# Patient Record
Sex: Female | Born: 1945 | Race: White | Hispanic: No | Marital: Married | Smoking: Never smoker
Health system: Southern US, Community
[De-identification: ages and names within clinical notes are randomized; demographics above are authoritative.]

## PROBLEM LIST (undated history)

## (undated) DIAGNOSIS — Z969 Presence of functional implant, unspecified: Secondary | ICD-10-CM

## (undated) DIAGNOSIS — E785 Hyperlipidemia, unspecified: Secondary | ICD-10-CM

## (undated) DIAGNOSIS — R0989 Other specified symptoms and signs involving the circulatory and respiratory systems: Secondary | ICD-10-CM

## (undated) DIAGNOSIS — J189 Pneumonia, unspecified organism: Secondary | ICD-10-CM

## (undated) DIAGNOSIS — H269 Unspecified cataract: Secondary | ICD-10-CM

## (undated) DIAGNOSIS — R519 Headache, unspecified: Secondary | ICD-10-CM

## (undated) DIAGNOSIS — K635 Polyp of colon: Secondary | ICD-10-CM

## (undated) DIAGNOSIS — J329 Chronic sinusitis, unspecified: Secondary | ICD-10-CM

## (undated) DIAGNOSIS — M545 Low back pain, unspecified: Secondary | ICD-10-CM

## (undated) DIAGNOSIS — R51 Headache: Secondary | ICD-10-CM

## (undated) DIAGNOSIS — K219 Gastro-esophageal reflux disease without esophagitis: Secondary | ICD-10-CM

## (undated) DIAGNOSIS — K5790 Diverticulosis of intestine, part unspecified, without perforation or abscess without bleeding: Secondary | ICD-10-CM

## (undated) DIAGNOSIS — I2510 Atherosclerotic heart disease of native coronary artery without angina pectoris: Secondary | ICD-10-CM

## (undated) DIAGNOSIS — I700 Atherosclerosis of aorta: Secondary | ICD-10-CM

## (undated) DIAGNOSIS — E6609 Other obesity due to excess calories: Secondary | ICD-10-CM

## (undated) DIAGNOSIS — E2839 Other primary ovarian failure: Secondary | ICD-10-CM

## (undated) DIAGNOSIS — R011 Cardiac murmur, unspecified: Secondary | ICD-10-CM

## (undated) DIAGNOSIS — Z8719 Personal history of other diseases of the digestive system: Secondary | ICD-10-CM

## (undated) DIAGNOSIS — R9431 Abnormal electrocardiogram [ECG] [EKG]: Secondary | ICD-10-CM

## (undated) DIAGNOSIS — Z90710 Acquired absence of both cervix and uterus: Secondary | ICD-10-CM

## (undated) DIAGNOSIS — R0789 Other chest pain: Secondary | ICD-10-CM

## (undated) DIAGNOSIS — M1990 Unspecified osteoarthritis, unspecified site: Secondary | ICD-10-CM

## (undated) DIAGNOSIS — K869 Disease of pancreas, unspecified: Secondary | ICD-10-CM

## (undated) DIAGNOSIS — R531 Weakness: Secondary | ICD-10-CM

## (undated) DIAGNOSIS — I10 Essential (primary) hypertension: Secondary | ICD-10-CM

## (undated) DIAGNOSIS — C801 Malignant (primary) neoplasm, unspecified: Secondary | ICD-10-CM

## (undated) DIAGNOSIS — M25569 Pain in unspecified knee: Secondary | ICD-10-CM

## (undated) DIAGNOSIS — K7460 Unspecified cirrhosis of liver: Secondary | ICD-10-CM

## (undated) DIAGNOSIS — N182 Chronic kidney disease, stage 2 (mild): Secondary | ICD-10-CM

## (undated) DIAGNOSIS — K2270 Barrett's esophagus without dysplasia: Secondary | ICD-10-CM

## (undated) DIAGNOSIS — M7710 Lateral epicondylitis, unspecified elbow: Secondary | ICD-10-CM

## (undated) DIAGNOSIS — R300 Dysuria: Secondary | ICD-10-CM

## (undated) DIAGNOSIS — N951 Menopausal and female climacteric states: Secondary | ICD-10-CM

## (undated) HISTORY — PX: FRACTURE SURGERY: SHX138

## (undated) HISTORY — DX: Menopausal and female climacteric states: N95.1

## (undated) HISTORY — DX: Acquired absence of both cervix and uterus: Z90.710

## (undated) HISTORY — DX: Other chest pain: R07.89

## (undated) HISTORY — DX: Dysuria: R30.0

## (undated) HISTORY — DX: Atherosclerotic heart disease of native coronary artery without angina pectoris: I25.10

## (undated) HISTORY — PX: BACK SURGERY: SHX140

## (undated) HISTORY — PX: BREAST SURGERY: SHX581

## (undated) HISTORY — DX: Presence of functional implant, unspecified: Z96.9

## (undated) HISTORY — PX: COLONOSCOPY W/ POLYPECTOMY: SHX1380

## (undated) HISTORY — DX: Other obesity due to excess calories: E66.09

## (undated) HISTORY — DX: Polyp of colon: K63.5

## (undated) HISTORY — DX: Other primary ovarian failure: E28.39

## (undated) HISTORY — DX: Cardiac murmur, unspecified: R01.1

## (undated) HISTORY — DX: Unspecified cataract: H26.9

## (undated) HISTORY — PX: INTRATHECAL PUMP IMPLANT: SHX6809

## (undated) HISTORY — DX: Barrett's esophagus without dysplasia: K22.70

## (undated) HISTORY — PX: ABDOMINAL HYSTERECTOMY: SHX81

## (undated) HISTORY — DX: Low back pain: M54.5

## (undated) HISTORY — PX: CATARACT EXTRACTION W/ INTRAOCULAR LENS IMPLANT: SHX1309

## (undated) HISTORY — DX: Chronic sinusitis, unspecified: J32.9

## (undated) HISTORY — PX: CHOLECYSTECTOMY: SHX55

## (undated) HISTORY — DX: Weakness: R53.1

## (undated) HISTORY — PX: CARPAL TUNNEL RELEASE: SHX101

## (undated) HISTORY — DX: Abnormal electrocardiogram (ECG) (EKG): R94.31

## (undated) HISTORY — DX: Pain in unspecified knee: M25.569

## (undated) HISTORY — DX: Unspecified osteoarthritis, unspecified site: M19.90

## (undated) HISTORY — DX: Lateral epicondylitis, unspecified elbow: M77.10

## (undated) HISTORY — DX: Hyperlipidemia, unspecified: E78.5

## (undated) HISTORY — DX: Malignant (primary) neoplasm, unspecified: C80.1

## (undated) HISTORY — DX: Gastro-esophageal reflux disease without esophagitis: K21.9

## (undated) HISTORY — DX: Low back pain, unspecified: M54.50

## (undated) HISTORY — DX: Other specified symptoms and signs involving the circulatory and respiratory systems: R09.89

---

## 2004-04-15 ENCOUNTER — Ambulatory Visit (HOSPITAL_COMMUNITY): Admission: RE | Admit: 2004-04-15 | Discharge: 2004-04-16 | Payer: Self-pay | Admitting: Neurosurgery

## 2005-02-07 ENCOUNTER — Ambulatory Visit (HOSPITAL_COMMUNITY): Admission: RE | Admit: 2005-02-07 | Discharge: 2005-02-07 | Payer: Self-pay | Admitting: Neurosurgery

## 2010-05-16 HISTORY — PX: CARDIAC CATHETERIZATION: SHX172

## 2010-09-13 ENCOUNTER — Encounter (HOSPITAL_COMMUNITY)
Admission: RE | Admit: 2010-09-13 | Discharge: 2010-09-13 | Disposition: A | Discharge status: Home / Self Care | Payer: Medicare Other | Source: Ambulatory Visit | Attending: Neurosurgery | Admitting: Neurosurgery

## 2010-09-13 LAB — BASIC METABOLIC PANEL
BUN: 4 mg/dL — ABNORMAL LOW (ref 6–23)
CO2: 34 mEq/L — ABNORMAL HIGH (ref 19–32)
Chloride: 102 mEq/L (ref 96–112)
Creatinine, Ser: 0.51 mg/dL (ref 0.4–1.2)
GFR calc Af Amer: >60 mL/min (ref >60)
Glucose, Bld: 86 mg/dL (ref 70–99)
Potassium: 3.8 mEq/L (ref 3.5–5.1)

## 2010-09-13 LAB — CBC
HCT: 42.6 % (ref 36.0–46.0)
Hemoglobin: 14.4 g/dL (ref 12.0–15.0)
MCH: 30.6 pg (ref 26.0–34.0)
MCV: 90.6 fL (ref 78.0–100.0)
Platelets: 176 10*3/uL (ref 150–400)
RBC: 4.7 MIL/uL (ref 3.87–5.11)
RDW: 12.4 % (ref 11.5–15.5)
WBC: 8.6 10*3/uL (ref 4.0–10.5)

## 2010-09-13 LAB — SURGICAL PCR SCREEN: MRSA, PCR: NEGATIVE

## 2010-09-17 ENCOUNTER — Ambulatory Visit (HOSPITAL_COMMUNITY): Payer: Medicare Other

## 2010-09-17 ENCOUNTER — Observation Stay (HOSPITAL_COMMUNITY)
Admission: RE | Admit: 2010-09-17 | Discharge: 2010-09-17 | Disposition: A | Discharge status: Home / Self Care | Payer: Medicare Other | Source: Ambulatory Visit | Attending: Neurosurgery | Admitting: Neurosurgery

## 2010-09-17 DIAGNOSIS — Z01812 Encounter for preprocedural laboratory examination: Secondary | ICD-10-CM | POA: Insufficient documentation

## 2010-09-17 DIAGNOSIS — Z01818 Encounter for other preprocedural examination: Secondary | ICD-10-CM | POA: Insufficient documentation

## 2010-09-17 DIAGNOSIS — G8929 Other chronic pain: Secondary | ICD-10-CM | POA: Insufficient documentation

## 2010-09-17 DIAGNOSIS — K219 Gastro-esophageal reflux disease without esophagitis: Secondary | ICD-10-CM | POA: Insufficient documentation

## 2010-09-17 DIAGNOSIS — J45909 Unspecified asthma, uncomplicated: Secondary | ICD-10-CM | POA: Insufficient documentation

## 2010-09-17 DIAGNOSIS — T85695A Other mechanical complication of other nervous system device, implant or graft, initial encounter: Principal | ICD-10-CM | POA: Insufficient documentation

## 2010-09-17 DIAGNOSIS — Y831 Surgical operation with implant of artificial internal device as the cause of abnormal reaction of the patient, or of later complication, without mention of misadventure at the time of the procedure: Secondary | ICD-10-CM | POA: Insufficient documentation

## 2010-09-17 DIAGNOSIS — I10 Essential (primary) hypertension: Secondary | ICD-10-CM | POA: Insufficient documentation

## 2010-09-21 NOTE — Op Note (Signed)
  [name], [name]              ACCOUNT NO.:  [id number]  MEDICAL RECORD NO.:  [id number]           PATIENT TYPE:  O  LOCATION:  3524                         FACILITY:  [facility]  PHYSICIAN:  Danae Orleans. Venetia Maxon, M.D.  DATE OF BIRTH:  12-Jun-1945  DATE OF PROCEDURE:  09/17/2010 DATE OF DISCHARGE:  09/17/2010                              OPERATIVE REPORT   PREOPERATIVE DIAGNOSIS:  Depleted intrathecal pain pump with secondary diagnosis of chronic pain.  POSTOPERATIVE DIAGNOSIS:  Depleted intrathecal pain pump with secondary diagnosis of chronic pain.  PROCEDURE:  Revision of intrathecal pump.  SURGEON:  Danae Orleans. Venetia Maxon, MD  ANESTHESIA:  General.  BLOOD LOSS:  Minimal.  COMPLICATIONS:  None.  DISPOSITION:  Recovery.  INDICATIONS:  Tiffany Hunter is a 65 year old woman with chronic intractable pain who has an implanted Medtronic intrathecal SynchroMed II pump, it was elected to end the battery with failing and it was elected to revise the implanted pump.  PROCEDURE:  Tiffany Hunter was brought to the operating room.  Following satisfactory and uncomplicated induction of general endotracheal anesthesia plus intravenous lines, the patient was placed in supine position on the operating table.  Her abdomen was then prepped and draped in usual sterile fashion with Betadine scrub and paint.  Area of planned incision was infiltrated with local lidocaine.  Previous incision was opened, pump was explanted.  The woven sock was cut to help remove the pump.  The old pump catheter was disconnected and a small portion of the tubing was cut.  A sutureless connector was then attached.  The new pump which had been loaded with fentanyl, bupivacaine, and hydromorphone according to the previous delivery schedule was then affixed to the sutureless connector.  The side port was accessed and the CSF withdrawn prior to hooking the pump, assembling the pump 2 mL of CSF was withdrawn from the catheter  without difficulty. The assemble pump was then repositioned in the subcutaneous pocket in the woven pouch which was then sutured back together and an anchoring stitch was placed to prevent the pump from flipping.  The wound was then closed with 2-0 and 3-0 Vicryl sutures. The sterile occlusive dressing was placed.  The patient was taken to the recovery room and tolerated the procedure well.     Danae Orleans. Venetia Maxon, M.D.     JDS/MEDQ  D:  09/17/2010  T:  09/18/2010  Job:  [id number]  Electronically Signed by Maeola Harman M.D. on 09/21/2010 09:45:01 AM

## 2010-11-19 NOTE — Discharge Summary (Signed)
  [name], [name] NO.:  [id number]  MEDICAL RECORD NO.:  [id number]  LOCATION:  3524                         FACILITY:  [facility]  PHYSICIAN:  Danae Orleans. Venetia Maxon, M.D.  DATE OF BIRTH:  1946-02-19  DATE OF ADMISSION:  09/17/2010 DATE OF DISCHARGE:  09/17/2010                              DISCHARGE SUMMARY   Tiffany Hunter is a 65 year old woman with chronic intractable pain who had an implanted Medtronic intrathecal SynchroMed II pump.  The battery was failing.  It was elected to revise the implant pump.  The patient was admitted to the hospital and underwent pump revision.  She did well with this procedure and was doing well immediately following surgery and was discharged home later that same day.  She was discharged home on aspirin, albuterol inhaler, metoclopramide 10 mg twice daily, and oxybutynin XL 10 mg daily.  The patient discharged home having tolerated the procedure and hospitalization well with instructions to follow up in my office 2-3 weeks postoperatively.     Danae Orleans. Venetia Maxon, M.D.     JDS/MEDQ  D:  11/16/2010  T:  11/16/2010  Job:  [id number]  Electronically Signed by Maeola Harman M.D. on 11/19/2010 09:19:43 AM

## 2010-12-15 ENCOUNTER — Institutional Professional Consult (permissible substitution): Payer: Medicare Other | Admitting: Internal Medicine

## 2010-12-15 ENCOUNTER — Encounter: Payer: Self-pay | Admitting: Internal Medicine

## 2010-12-16 ENCOUNTER — Encounter: Payer: Self-pay | Admitting: *Deleted

## 2010-12-16 ENCOUNTER — Ambulatory Visit (INDEPENDENT_AMBULATORY_CARE_PROVIDER_SITE_OTHER): Payer: Medicare Other | Admitting: Internal Medicine

## 2010-12-16 ENCOUNTER — Encounter: Payer: Self-pay | Admitting: Internal Medicine

## 2010-12-16 VITALS — BP 153/90 | HR 66 | Resp 18 | Ht 62.0 in | Wt 184.0 lb

## 2010-12-16 DIAGNOSIS — R0602 Shortness of breath: Secondary | ICD-10-CM

## 2010-12-16 DIAGNOSIS — R9439 Abnormal result of other cardiovascular function study: Secondary | ICD-10-CM

## 2010-12-16 NOTE — Progress Notes (Signed)
HPI:  Tiffany Hunter is a 65 y/o with h/o obesity, HL, GERD, asthma and severe back pain with implantable morphine pump referred by Arnette Felts for evaluation of abnormal stress test and possible PAH.  Has a h/o mild DOE. Underwent Myoview 4/12 EF 72% Reversible defect in inferoseptal, inferior,  inferior and apical walls.  Echo 70% RV mild RVH but normal fx. Mild diastolic dysfunction. Trace TR. Unable to estimate pulmonary pressures.   Denies edema. Remains fairly active. No problems significant dyspnea. No CP. Dizziness. Never smoked. No significant cough. Occasional wheezing. Says she had PFTs and they were OK (I don't have report).    ROS: All other systems normal except as mentioned in HPI, past medical history and problem list.    Past Medical History  Diagnosis Date  . Pulmonary hypertension   . Barrett's esophagus   . Colonic polyp   . Low back pain   . Hyperlipidemia   . Ovarian failure   . GERD (gastroesophageal reflux disease)   . Exogenous obesity   . Sinusitis   . Arthritis   . Chest wall pain   . Carotid artery bruit   . Heart murmur   . Osteoporosis   . Asthma   . Abnormal EKG   . Weakness   . H/O: hysterectomy     Current Outpatient Prescriptions  Medication Sig Dispense Refill  . aspirin 325 MG EC tablet Take 325 mg by mouth daily.        . metoCLOPramide (REGLAN) 10 MG tablet Take 10 mg by mouth 2 (two) times daily.        Marland Kitchen oxybutynin (DITROPAN-XL) 10 MG 24 hr tablet Take 10 mg by mouth daily.        . pantoprazole (PROTONIX) 40 MG tablet Take 40 mg by mouth daily.        . traMADol (ULTRAM) 50 MG tablet Take 50 mg by mouth every 6 (six) hours as needed.           Allergies  Allergen Reactions  . Sulfa Antibiotics     History   Social History  . Marital Status: Married    Spouse Name: N/A    Number of Children: N/A  . Years of Education: N/A   Occupational History  . Not on file.   Social History Main Topics  . Smoking status: Current Some Day  Smoker  . Smokeless tobacco: Not on file  . Alcohol Use: No  . Drug Use: No  . Sexually Active: Not on file   Other Topics Concern  . Not on file   Social History Narrative  . No narrative on file    Family History  Problem Relation Age of Onset  . Coronary artery disease      family hx of  . Colon cancer      family hx of    PHYSICAL EXAM: Filed Vitals:   12/16/10 1403  BP: 153/90  Pulse: 66  Resp: 18  Walks briskly around clinic no CP or SOB sats 95-96% General:  Well appearing. No respiratory difficulty HEENT: normal Neck: supple. no JVD. Carotids 2+ bilat; no bruits. No lymphadenopathy or thryomegaly appreciated. Cor: PMI nondisplaced. Regular rate & rhythm. No rubs, gallops or murmurs. Lungs: clear Abdomen:  Obese soft, nontender, nondistended. Good bowel sounds. Extremities: no cyanosis, clubbing, rash, edema Neuro: alert & oriented x 3, cranial nerves grossly intact. moves all 4 extremities w/o difficulty. Affect pleasant.  ECG: NSR 62 non-specific st changes. Normal axis  ASSESSMENT & PLAN:

## 2010-12-16 NOTE — Patient Instructions (Signed)
Labs today  Your physician has requested that you have a cardiac catheterization. Cardiac catheterization is used to diagnose and/or treat various heart conditions. Doctors may recommend this procedure for a number of different reasons. The most common reason is to evaluate chest pain. Chest pain can be a symptom of coronary artery disease (CAD), and cardiac catheterization can show whether plaque is narrowing or blocking your heart's arteries. This procedure is also used to evaluate the valves, as well as measure the blood flow and oxygen levels in different parts of your heart. For further information please visit [email or web address] Please follow instruction sheet, as given.  Your physician recommends that you schedule a follow-up appointment in: 1 month.

## 2010-12-16 NOTE — Assessment & Plan Note (Signed)
She has very mild exertional dyspnea which may be related to her body habitus; however stress test suggests moderate area of ischemia. We discussed fact that defect on Myoview may be due to soft-tissue/diaphragmatic attenuation vs ischemia. We also discussed possibility of cath versus medical therapy for presumed CAD. We have decided to proceed with diagnostic cath next week to further evaluate. No clear evidence of pulmonary HTN but given prominent RV on echo will proceed with RHC at time of coronary angiography.

## 2010-12-16 NOTE — Assessment & Plan Note (Signed)
As above.

## 2010-12-17 LAB — BASIC METABOLIC PANEL
BUN: 8 mg/dL (ref 6–23)
CO2: 34 mEq/L — ABNORMAL HIGH (ref 19–32)
Calcium: 9.1 mg/dL (ref 8.4–10.5)
Chloride: 102 mEq/L (ref 96–112)
GFR: 113.32 mL/min (ref >60.00)
Glucose, Bld: 89 mg/dL (ref 70–99)
Potassium: 4.1 mEq/L (ref 3.5–5.1)

## 2010-12-17 LAB — CBC WITH DIFFERENTIAL/PLATELET
Basophils Absolute: 0 10*3/uL (ref 0.0–0.1)
Basophils Relative: 0.2 % (ref 0.0–3.0)
HCT: 42.1 % (ref 36.0–46.0)
Hemoglobin: 14 g/dL (ref 12.0–15.0)
Lymphocytes Relative: 28.1 % (ref 12.0–46.0)
Lymphs Abs: 2.4 10*3/uL (ref 0.7–4.0)
MCHC: 33.2 g/dL (ref 30.0–36.0)
MCV: 91.1 fl (ref 78.0–100.0)
Monocytes Absolute: 0.4 10*3/uL (ref 0.1–1.0)
Monocytes Relative: 4.6 % (ref 3.0–12.0)
Neutro Abs: 5.4 10*3/uL (ref 1.4–7.7)
Neutrophils Relative %: 64 % (ref 43.0–77.0)
Platelets: 179 10*3/uL (ref 150.0–400.0)
RDW: 13.1 % (ref 11.5–14.6)

## 2010-12-17 LAB — PROTIME-INR: Prothrombin Time: 12.1 s (ref 10.2–12.4)

## 2010-12-24 ENCOUNTER — Inpatient Hospital Stay (HOSPITAL_BASED_OUTPATIENT_CLINIC_OR_DEPARTMENT_OTHER)
Admission: RE | Admit: 2010-12-24 | Discharge: 2010-12-24 | Disposition: A | Discharge status: Home / Self Care | Payer: Medicare Other | Source: Ambulatory Visit | Attending: Internal Medicine | Admitting: Internal Medicine

## 2010-12-24 DIAGNOSIS — R0989 Other specified symptoms and signs involving the circulatory and respiratory systems: Secondary | ICD-10-CM

## 2010-12-24 DIAGNOSIS — I2510 Atherosclerotic heart disease of native coronary artery without angina pectoris: Secondary | ICD-10-CM | POA: Insufficient documentation

## 2010-12-24 DIAGNOSIS — R0609 Other forms of dyspnea: Secondary | ICD-10-CM | POA: Insufficient documentation

## 2010-12-24 LAB — POCT I-STAT 3, VENOUS BLOOD GAS (G3P V)
Acid-Base Excess: 5 mmol/L — ABNORMAL HIGH (ref 0.0–2.0)
Bicarbonate: 27.1 mEq/L — ABNORMAL HIGH (ref 20.0–24.0)
Bicarbonate: 31.3 mEq/L — ABNORMAL HIGH (ref 20.0–24.0)
O2 Saturation: 73 %
O2 Saturation: 76 %
TCO2: 29 mmol/L (ref 0–100)
TCO2: 33 mmol/L (ref 0–100)
pCO2, Ven: 49.8 mmHg (ref 45.0–50.0)
pCO2, Ven: 51.2 mmHg — ABNORMAL HIGH (ref 45.0–50.0)
pH, Ven: 7.343 — ABNORMAL HIGH (ref 7.250–7.300)
pH, Ven: 7.394 — ABNORMAL HIGH (ref 7.250–7.300)
pO2, Ven: 42 mmHg (ref 30.0–45.0)

## 2010-12-24 LAB — POCT I-STAT 3, ART BLOOD GAS (G3+)
Acid-Base Excess: 5 mmol/L — ABNORMAL HIGH (ref 0.0–2.0)
Bicarbonate: 29.8 mEq/L — ABNORMAL HIGH (ref 20.0–24.0)
O2 Saturation: 93 %
TCO2: 31 mmol/L (ref 0–100)
pH, Arterial: 7.426 — ABNORMAL HIGH (ref 7.350–7.400)
pO2, Arterial: 65 mmHg — ABNORMAL LOW (ref 80.0–100.0)

## 2011-01-11 NOTE — Cardiovascular Report (Signed)
  Tiffany Hunter              ACCOUNT NO.:  [id number]  MEDICAL RECORD NO.:  [id number]  LOCATION:                                 FACILITY:  PHYSICIAN:  Bevelyn Buckles. Bensimhon, MDDATE OF BIRTH:  Nov 04, 1945  DATE OF PROCEDURE:  12/24/2010 DATE OF DISCHARGE:                           CARDIAC CATHETERIZATION   Tiffany Hunter is a very pleasant 65 year old woman with a history of obesity, hyperlipidemia, GERD, asthma, and severe back pain.  She had a Myoview with Arnette Felts at the [facility] in April 2012.  This showed an EF of 72% but mild reversible defect in the inferior and apical walls concerning for ischemia.  She was referred for cardiac catheterization.  INDICATIONS:  Dyspnea and positive stress test.  PROCEDURES PERFORMED: 1. Selective coronary geography 2. Left heart cath. 3. Left ventriculogram. 4. Right heart cath.  DESCRIPTION OF PROCEDURE:  Risks and indications were explained. Consent was signed and placed in the chart.  Right groin area was prepped and draped in routine sterile fashion, anesthetized with 1% local lidocaine.  A 4-French arterial sheath and 7-French venous sheath was placed in the right femoral vessels using a modified Seldinger technique.  Standard catheters including a JL-4, 3DRC and angled pigtail were used as well as Swan-Ganz catheter.  All catheter exchanges made over wire.  There were no apparent complications.  HEMODYNAMIC RESULTS:  Right atrial pressure mean of 2.  PA pressure of 27/8 with mean of 16.  Pulmonary capillary wedge pressure, mean of 6. Central aortic pressure 145/75 with a mean of 106.  LV pressure 155/11 with an EDP of about 15.  Fick cardiac output 6.6.  Cardiac index 3.5.  Left main was normal.  LAD was a very small vessel, gave off a diagonal and a septal perforator.  There was mild calcium in the proximal segment but no significant stenosis.  Left circumflex gave off a ramus branch and a large OM1,  was angiographically normal.  Right coronary artery was a dominant vessel.  It had a anterior takeoff with a proximal bend in it.  There was a PDA in several posterolaterals. She had 30% lesion proximally with 20% lesion in the midsection, and 30% lesion distally and mild diffuse plaquing throughout the PDA and PL system.  Left ventriculogram done in the RAO position showed an EF of 60-65%.  No regional wall motion abnormalities.  ASSESSMENT: 1. Nonobstructive coronary artery disease. 2. Normal right heart pressures. 3. Normal left ventricular function.  PLAN/DISCUSSION:  Based on the catheterization, I suspect her stress test was a false positive.  She will need risk factor management to prevent progression of her coronary artery disease.     Bevelyn Buckles. Bensimhon, MD     DRB/MEDQ  D:  12/24/2010  T:  12/24/2010  Job:  [id number]  cc:   Roxanne Mins, PA-C  Electronically Signed by Arvilla Meres MD on 01/11/2011 05:11:50 PM

## 2011-01-12 ENCOUNTER — Encounter: Payer: Self-pay | Admitting: Physician Assistant

## 2011-01-12 ENCOUNTER — Ambulatory Visit (INDEPENDENT_AMBULATORY_CARE_PROVIDER_SITE_OTHER): Payer: Medicare Other | Admitting: Physician Assistant

## 2011-01-12 VITALS — BP 129/82 | HR 72 | Ht 62.5 in | Wt 185.4 lb

## 2011-01-12 DIAGNOSIS — I2510 Atherosclerotic heart disease of native coronary artery without angina pectoris: Secondary | ICD-10-CM

## 2011-01-12 MED ORDER — PRAVASTATIN SODIUM 40 MG PO TABS: 40.0000 mg | ORAL_TABLET | Freq: Every evening | ORAL | Status: DC

## 2011-01-12 NOTE — Patient Instructions (Signed)
Your physician recommends that you schedule a follow-up appointment in: AS NEEDED  Your physician recommends that you schedule a follow-up appointment in: PLEASE FOLLOW UP WITH DR. Elise Benne @ [facility] AS PER SCOTT WEAVER, PA-C  A PRESCRIPTION HAS BEEN GIVEN TO YOU TODAY TO HAVE FASTING LIVER/LIPID PANEL DONE IN 1 WEEK @ YOUR DOCTORS OFFICE @ [facility]. PLEASE HAVE RESULTS FAXED TO [place], PA-C [phone]

## 2011-01-12 NOTE — Progress Notes (Signed)
History of Present Illness: Primary Cardiologist:  Dr. Arvilla Meres   Tiffany Hunter is a 65 y.o. female with h/o obesity, HL, GERD, asthma and severe back pain with implantable morphine pump who was referred by Arnette Felts for evaluation of abnormal stress test and possible PAH.  She saw Dr. Gala Romney earlier this month.  She has a h/o mild DOE.  She underwent Myoview 4/12 demonstrating an EF 72% with reversible defect in inferoseptal, inferior,  inferior and apical walls.   Echo with EF 70%, RV with mild RVH, but normal fx.; mild diastolic dysfunction; trace TR; unable to estimate pulmonary pressures.   She was set up for cardiac catheterization 8/10.  This demonstrated nonobstructive disease with a 30% proximal RCA, 20% mid RCA and 30% distal RCA and mild diffuse plaquing throughout the PDA and PL system.  EF is 60-65%.  Right atrial pressure with a mean of 2, PA pressure 27/8 with a mean of 16.  PCWP mean 6.  Fick cardiac output 6.6, cardiac index 3.5.  She had normal right heart pressures.  It was suspected that her stress test was false positive.  Risk factor modification was recommended.  The patient denies chest pain, shortness of breath, syncope, orthopnea, PND or significant pedal edema.  She is already taking ASA QD.  Her BP looks good.  She does not smoke.  I have suggested she start cholesterol medication.  I will place her on Pravastatin.  She prefers to follow up with Arnette Felts, PA-C at [facility] in [place].  Past Medical History  Diagnosis Date  . Barrett's esophagus   . Colonic polyp   . Low back pain   . Hyperlipidemia   . Ovarian failure   . GERD (gastroesophageal reflux disease)   . Exogenous obesity   . Sinusitis   . Arthritis   . Chest wall pain   . Carotid artery bruit   . Heart murmur   . Osteoporosis   . Asthma   . Abnormal EKG   . Weakness   . H/O: hysterectomy   . CAD (coronary artery disease)     Myoview 4/12: inf and apical reversible defect  concerning for ischemia;    cath 8/12: pLAD with mild Ca but no sig stenosis; pRCA 30%, mRCA 20%, dRCA 30% and mild diffuse plaquing throughout the PDA and PL system; EF 60-65%; normal right heart pressures    Current Outpatient Prescriptions  Medication Sig Dispense Refill  . aspirin 325 MG EC tablet Take 81 mg by mouth daily.       Marland Kitchen oxybutynin (DITROPAN-XL) 10 MG 24 hr tablet Take 10 mg by mouth daily.        . pantoprazole (PROTONIX) 40 MG tablet Take 40 mg by mouth daily.        . traMADol (ULTRAM) 50 MG tablet Take 50 mg by mouth every 6 (six) hours as needed.        . pravastatin (PRAVACHOL) 40 MG tablet Take 1 tablet (40 mg total) by mouth every evening.  30 tablet  11    Allergies: Allergies  Allergen Reactions  . Sulfa Antibiotics     Vital Signs: BP 129/82  Pulse 72  Ht 5' 2.5" (1.588 m)  Wt 185 lb 6.4 oz (84.097 kg)  BMI 33.37 kg/m2  PHYSICAL EXAM: Well nourished, well developed, in no acute distress HEENT: normal Neck: no JVD Cardiac:  normal S1, S2; RRR; no murmur Lungs:  clear to auscultation bilaterally, no wheezing, rhonchi  or rales Abd: soft, nontender, no hepatomegaly Ext: no edema; RFA site without hematoma or bruit Skin: warm and dry Neuro:  CNs 2-12 intact, no focal abnormalities noted  ASSESSMENT AND PLAN:

## 2011-01-12 NOTE — Assessment & Plan Note (Signed)
She has nonobstructive disease mainly in the RCA.  She had a false + myoview.  She has no symptoms of angina.  Continue risk factor modification.  She will continue ASA.  She is not a smoker.  I will start her on pravastatin 40 mg QHS.  She will be set up for FLP and LFTs.  She prefers to follow up with Mr. Elise Benne at [place].  I have suggested she follow up in 3 months and she can have her lipids rechecked then.  She will follow up here PRN.

## 2011-01-28 ENCOUNTER — Ambulatory Visit: Payer: Medicare Other | Admitting: Cardiovascular Disease

## 2012-09-27 ENCOUNTER — Other Ambulatory Visit: Payer: Self-pay | Admitting: Orthopedic Surgery

## 2012-09-27 DIAGNOSIS — M25521 Pain in right elbow: Secondary | ICD-10-CM

## 2012-10-11 ENCOUNTER — Ambulatory Visit
Admission: RE | Admit: 2012-10-11 | Discharge: 2012-10-11 | Disposition: A | Discharge status: Home / Self Care | Payer: Medicare Other | Source: Ambulatory Visit | Attending: Orthopedic Surgery | Admitting: Orthopedic Surgery

## 2012-10-11 DIAGNOSIS — M25521 Pain in right elbow: Secondary | ICD-10-CM

## 2012-11-20 DIAGNOSIS — M7710 Lateral epicondylitis, unspecified elbow: Secondary | ICD-10-CM

## 2012-11-20 HISTORY — DX: Lateral epicondylitis, unspecified elbow: M77.10

## 2013-09-18 ENCOUNTER — Ambulatory Visit: Payer: Self-pay | Admitting: Pain Medicine

## 2013-10-08 ENCOUNTER — Ambulatory Visit: Payer: Self-pay | Admitting: Pain Medicine

## 2013-11-12 ENCOUNTER — Ambulatory Visit: Payer: Self-pay | Admitting: Pain Medicine

## 2014-01-21 DIAGNOSIS — M1990 Unspecified osteoarthritis, unspecified site: Secondary | ICD-10-CM | POA: Insufficient documentation

## 2014-01-21 DIAGNOSIS — R300 Dysuria: Secondary | ICD-10-CM

## 2014-01-21 DIAGNOSIS — I10 Essential (primary) hypertension: Secondary | ICD-10-CM | POA: Insufficient documentation

## 2014-01-21 HISTORY — DX: Dysuria: R30.0

## 2014-02-04 ENCOUNTER — Ambulatory Visit: Payer: Self-pay | Admitting: Pain Medicine

## 2014-04-03 ENCOUNTER — Ambulatory Visit: Payer: Self-pay | Admitting: Pain Medicine

## 2014-12-30 DIAGNOSIS — N182 Chronic kidney disease, stage 2 (mild): Secondary | ICD-10-CM | POA: Insufficient documentation

## 2014-12-30 DIAGNOSIS — I2510 Atherosclerotic heart disease of native coronary artery without angina pectoris: Secondary | ICD-10-CM | POA: Insufficient documentation

## 2014-12-30 DIAGNOSIS — E782 Mixed hyperlipidemia: Secondary | ICD-10-CM | POA: Insufficient documentation

## 2014-12-30 DIAGNOSIS — R0681 Apnea, not elsewhere classified: Secondary | ICD-10-CM | POA: Insufficient documentation

## 2014-12-30 DIAGNOSIS — R0989 Other specified symptoms and signs involving the circulatory and respiratory systems: Secondary | ICD-10-CM | POA: Insufficient documentation

## 2014-12-30 DIAGNOSIS — R0609 Other forms of dyspnea: Secondary | ICD-10-CM | POA: Insufficient documentation

## 2014-12-30 DIAGNOSIS — E119 Type 2 diabetes mellitus without complications: Secondary | ICD-10-CM | POA: Insufficient documentation

## 2014-12-30 DIAGNOSIS — I2721 Secondary pulmonary arterial hypertension: Secondary | ICD-10-CM | POA: Insufficient documentation

## 2014-12-31 DIAGNOSIS — R0789 Other chest pain: Secondary | ICD-10-CM | POA: Insufficient documentation

## 2015-02-25 ENCOUNTER — Other Ambulatory Visit: Payer: Self-pay

## 2015-02-25 DIAGNOSIS — G894 Chronic pain syndrome: Secondary | ICD-10-CM

## 2015-02-25 MED ORDER — PAIN MANAGEMENT IT PUMP REFILL: 1.0000 | Freq: Once | INTRATHECAL | Status: DC

## 2015-02-27 ENCOUNTER — Other Ambulatory Visit: Payer: Self-pay

## 2015-02-27 MED ORDER — PAIN MANAGEMENT IT PUMP REFILL: 1.0000 | Freq: Once | INTRATHECAL | Status: DC

## 2015-03-19 ENCOUNTER — Ambulatory Visit: Payer: Medicare Other | Attending: Pain Medicine | Admitting: Pain Medicine

## 2015-03-19 ENCOUNTER — Encounter: Payer: Self-pay | Admitting: Pain Medicine

## 2015-03-19 VITALS — BP 147/68 | HR 69 | Temp 98.9°F | Resp 18 | Ht 62.0 in | Wt 180.0 lb

## 2015-03-19 DIAGNOSIS — Z9689 Presence of other specified functional implants: Secondary | ICD-10-CM | POA: Diagnosis not present

## 2015-03-19 DIAGNOSIS — E7801 Familial hypercholesterolemia: Secondary | ICD-10-CM | POA: Insufficient documentation

## 2015-03-19 DIAGNOSIS — G894 Chronic pain syndrome: Secondary | ICD-10-CM | POA: Diagnosis not present

## 2015-03-19 DIAGNOSIS — G8929 Other chronic pain: Secondary | ICD-10-CM

## 2015-03-19 DIAGNOSIS — M25559 Pain in unspecified hip: Secondary | ICD-10-CM | POA: Diagnosis present

## 2015-03-19 DIAGNOSIS — Z969 Presence of functional implant, unspecified: Secondary | ICD-10-CM

## 2015-03-19 DIAGNOSIS — N182 Chronic kidney disease, stage 2 (mild): Secondary | ICD-10-CM | POA: Insufficient documentation

## 2015-03-19 DIAGNOSIS — F1190 Opioid use, unspecified, uncomplicated: Secondary | ICD-10-CM | POA: Insufficient documentation

## 2015-03-19 DIAGNOSIS — Z79891 Long term (current) use of opiate analgesic: Secondary | ICD-10-CM | POA: Insufficient documentation

## 2015-03-19 DIAGNOSIS — M545 Low back pain: Secondary | ICD-10-CM

## 2015-03-19 DIAGNOSIS — E119 Type 2 diabetes mellitus without complications: Secondary | ICD-10-CM | POA: Insufficient documentation

## 2015-03-19 DIAGNOSIS — Z978 Presence of other specified devices: Secondary | ICD-10-CM | POA: Insufficient documentation

## 2015-03-19 DIAGNOSIS — I2510 Atherosclerotic heart disease of native coronary artery without angina pectoris: Secondary | ICD-10-CM | POA: Insufficient documentation

## 2015-03-19 DIAGNOSIS — M549 Dorsalgia, unspecified: Secondary | ICD-10-CM | POA: Diagnosis present

## 2015-03-19 DIAGNOSIS — Z451 Encounter for adjustment and management of infusion pump: Secondary | ICD-10-CM | POA: Insufficient documentation

## 2015-03-19 DIAGNOSIS — F1120 Opioid dependence, uncomplicated: Secondary | ICD-10-CM

## 2015-03-19 DIAGNOSIS — Z79899 Other long term (current) drug therapy: Secondary | ICD-10-CM

## 2015-03-19 NOTE — Progress Notes (Signed)
IT pump refilled as prescribed witnessed by D. Donneta Romberg RN without complications.  Denies side effects or complications from pump medications.

## 2015-03-19 NOTE — Progress Notes (Signed)
Safety precautions to be maintained throughout the outpatient stay will include: orient to surroundings, keep bed in low position, maintain call bell within reach at all times, provide assistance with transfer out of bed and ambulation.  

## 2015-03-20 ENCOUNTER — Telehealth: Payer: Self-pay

## 2015-03-20 DIAGNOSIS — G8929 Other chronic pain: Secondary | ICD-10-CM | POA: Insufficient documentation

## 2015-03-20 DIAGNOSIS — Z969 Presence of functional implant, unspecified: Secondary | ICD-10-CM

## 2015-03-20 DIAGNOSIS — M5442 Lumbago with sciatica, left side: Secondary | ICD-10-CM

## 2015-03-20 HISTORY — DX: Presence of functional implant, unspecified: Z96.9

## 2015-03-20 MED FILL — Medication: Qty: 1 | Status: AC

## 2015-03-20 NOTE — Telephone Encounter (Signed)
Patient states she is doing fine.   

## 2015-03-20 NOTE — Progress Notes (Signed)
Patient's Name: Tiffany Hunter MRN: [id number] DOB: [birth year] DOS: 03/19/2015  Primary Reason(s) for Visit: Intrathecal Pump Refill. CC: Back Pain and Hip Pain   Pre-Procedure Assessment: Tiffany Hunter is a 69 y.o. year old, female patient, seen today for interventional treatment. She has Abnormal cardiovascular stress test; Shortness of breath; CAD (coronary artery disease); Chronic pain; Chronic pain syndrome; Presence of intrathecal pump; Long term current use of opiate analgesic; Long term prescription opiate use; Opiate use; Opioid dependence ([place]); Encounter for adjustment or management of infusion pump; Presence of functional implant (Medtronic programmable intrathecal pump); Atypical chest pain; Arteriosclerosis of coronary artery; Carotid artery bruit; Chronic kidney disease (CKD), stage II (mild); Diabetes mellitus ([place]); Breathlessness on exertion; Difficult or painful urination; Benign hypertension; Combined fat and carbohydrate induced hyperlipemia; Arthritis, degenerative; 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 Her primarily concern today is the Back Pain and Hip Pain Verification of the correct person, correct site (including marking of site), and correct procedure were performed and confirmed by the patient. Today's Vitals   03/19/15 1356  BP: 147/68  Pulse: 69  Temp: 98.9 F (37.2 C)  TempSrc: Oral  Resp: 18  Height: '5\' 2"'  (1.575 m)  Weight: 180 lb (81.647 kg)  SpO2: 100%  PainSc: 8   Calculated BMI: Body mass index is 32.91 kg/(m^2). Allergies: She is allergic to sulfa antibiotics.. Primary Diagnosis: Chronic pain syndrome [G89.4]  Procedure: Type: Palliative Intrathecal Pump Refill by Nursing Staff Region: Lower Abdominal Area Laterality: Right  Indications: Chronic Pain  Consent: Secured. Under the influence of no sedatives a written informed consent was obtained, after having provided information on the  risks and possible complications. To fulfill our ethical and legal obligations, as recommended by the American Medical Association's Code of Ethics, we have provided information to the patient about our clinical impression; the nature and purpose of the treatment or procedure; the risks, benefits, and possible complications of the intervention; alternatives; the risk(s) and benefit(s) of the alternative treatment(s) or procedure(s); and the risk(s) and benefit(s) of doing nothing. The patient was provided information about the risks and possible complications associated with the procedure. These include, but are not limited to, failure to achieve desired goals, infection, bleeding, organ or nerve damage, allergic reactions, paralysis, and death. In addition, the patient was informed that Medicine is not an exact science; therefore, there is also the possibility of unforeseen risks and possible complications that may result in a catastrophic outcome. The patient indicated having understood very clearly. We have given the patient no guarantees and we have made no promises. Enough time was given to the patient to ask questions, all of which were answered to the patient's satisfaction.  Pre-Procedure Preparation: Safety Precautions: Allergies reviewed. Appropriate site, procedure, and patient were confirmed by following the Joint Commission's Universal Protocol (UP.01.01.01), in the form of a "Time Out". The patient was asked about blood thinners, or active infections, both of which were denied. Monitoring:  Clinical observation Infection Control Precautions: Sterile technique used. Standard Universal Precautions were taken as recommended by the Department of [facility] for Disease Control and Prevention (CDC). Standard pre-surgical skin prep was conducted. Respiratory hygiene and cough etiquette was practiced. Hand hygiene observed. Safe injection practices and needle disposal techniques followed.  Medications properly checked for expiration dates and contaminants. Personal protective equipment (PPE) used: Sterile surgical gloves.  Anesthesia, Analgesia, Anxiolysis: Type: None required Local Anesthetic(s): None used. IV Access: None. Sedation: Not necessary or indicated.  Indication(s): None.  Description of Procedure Process:  Time-out: "Time-out" completed before starting procedure, as per protocol. Position: Supine Target Area: For Intrathecal Pump Refill(s), the target is the central port. Approach: Anterior, 90 degree angle approach. Area Prepped: Entire Area around the pump implant. Prepping solution: ChloraPrep (2% chlorhexidine gluconate and 70% isopropyl alcohol) Safety Precautions: Aspiration looking for blood return was conducted prior to all injections. At no point did we inject any substances, as a needle was being advanced. No attempts were made at seeking any paresthesias. Safe injection practices and needle disposal techniques used. Medications properly checked for expiration dates. SDV (single dose vial) medications used. Description of the Procedure: Protocol guidelines were followed. Two nurses trained to do implant refills were present during the entire procedure. The refill medication was checked by both healthcare providers as well as the patient. The patient was included in the "Time-out" to verify the medication. The patient was placed in position. The pump was identified. The area was prepped in the usual manner. The sterile template was positioned over the pump, making sure the side-port location matched that of the pump. Both, the pump and the template were held for stability. The needle provided in the Medtronic Kit was then introduced thru the center of the template and into the central port. The pump content was aspirated and discarded volume documented. The new medication was slowly infused into the pump, thru the filter, making sure to avoid overpressure of the  device. The needle was then removed and the area cleansed, making sure to leave some of the prepping solution back to take advantage of its long term bactericidal properties. The pump was interrogated and programmed to reflect the correct medication, volume, and dosage. The program was printed and taken to the physician for approval. Once checked and signed by the physician, a copy was provided to the patient and another scanned into the EMR. EBL: None Materials Used:  Medtronic Refill Kit  Post-procedure Assessment:  Complications: No immediate post-procedural complications were observed. Disposition: Return to clinic in 3 to4 months for refill. (See printout) Comments:  No additional relevant information.  Assessment: Encounter Diagnosis:  Primary Diagnosis: Chronic pain syndrome [G89.4] Plan: Tiffany Hunter was seen today for back pain and hip pain.  Diagnoses and all orders for this visit:  Chronic pain syndrome -     PUMP REFILL; Future -     PUMP REFILL  Chronic pain  Presence of intrathecal pump  Long term current use of opiate analgesic  Long term prescription opiate use  Opiate use  Uncomplicated opioid dependence (HCC)  Presence of functional implant (Medtronic programmable intrathecal pump)  Chronic low back pain   Patient instructions:  There are no Patient Instructions on file for this visit.  Primary Care Physician: Venetia Maxon, DO Location: [facility] Attending Physician: Gaspar Cola, MD

## 2015-05-15 DIAGNOSIS — N951 Menopausal and female climacteric states: Secondary | ICD-10-CM | POA: Insufficient documentation

## 2015-05-15 HISTORY — DX: Menopausal and female climacteric states: N95.1

## 2015-05-19 ENCOUNTER — Other Ambulatory Visit: Payer: Self-pay

## 2015-05-19 MED ORDER — PAIN MANAGEMENT IT PUMP REFILL: 1.0000 | Freq: Once | INTRATHECAL | Status: DC

## 2015-05-27 ENCOUNTER — Encounter: Payer: Self-pay | Admitting: Pain Medicine

## 2015-05-27 ENCOUNTER — Ambulatory Visit: Payer: Medicare Other | Attending: Pain Medicine | Admitting: Pain Medicine

## 2015-05-27 VITALS — BP 128/62 | HR 79 | Temp 98.9°F | Resp 18 | Ht 62.5 in | Wt 180.0 lb

## 2015-05-27 DIAGNOSIS — N182 Chronic kidney disease, stage 2 (mild): Secondary | ICD-10-CM | POA: Insufficient documentation

## 2015-05-27 DIAGNOSIS — M5134 Other intervertebral disc degeneration, thoracic region: Secondary | ICD-10-CM | POA: Diagnosis not present

## 2015-05-27 DIAGNOSIS — M47816 Spondylosis without myelopathy or radiculopathy, lumbar region: Secondary | ICD-10-CM | POA: Insufficient documentation

## 2015-05-27 DIAGNOSIS — G894 Chronic pain syndrome: Secondary | ICD-10-CM

## 2015-05-27 DIAGNOSIS — I2510 Atherosclerotic heart disease of native coronary artery without angina pectoris: Secondary | ICD-10-CM | POA: Diagnosis not present

## 2015-05-27 DIAGNOSIS — M150 Primary generalized (osteo)arthritis: Secondary | ICD-10-CM | POA: Diagnosis not present

## 2015-05-27 DIAGNOSIS — M1990 Unspecified osteoarthritis, unspecified site: Secondary | ICD-10-CM | POA: Insufficient documentation

## 2015-05-27 DIAGNOSIS — Z451 Encounter for adjustment and management of infusion pump: Secondary | ICD-10-CM

## 2015-05-27 DIAGNOSIS — M549 Dorsalgia, unspecified: Secondary | ICD-10-CM | POA: Diagnosis not present

## 2015-05-27 DIAGNOSIS — M25562 Pain in left knee: Secondary | ICD-10-CM

## 2015-05-27 DIAGNOSIS — E119 Type 2 diabetes mellitus without complications: Secondary | ICD-10-CM | POA: Diagnosis not present

## 2015-05-27 DIAGNOSIS — G8929 Other chronic pain: Secondary | ICD-10-CM

## 2015-05-27 DIAGNOSIS — M4726 Other spondylosis with radiculopathy, lumbar region: Secondary | ICD-10-CM

## 2015-05-27 DIAGNOSIS — M1712 Unilateral primary osteoarthritis, left knee: Secondary | ICD-10-CM | POA: Diagnosis not present

## 2015-05-27 DIAGNOSIS — M545 Low back pain: Secondary | ICD-10-CM | POA: Diagnosis present

## 2015-05-27 DIAGNOSIS — M539 Dorsopathy, unspecified: Secondary | ICD-10-CM

## 2015-05-27 DIAGNOSIS — M1612 Unilateral primary osteoarthritis, left hip: Secondary | ICD-10-CM

## 2015-05-27 DIAGNOSIS — M25552 Pain in left hip: Secondary | ICD-10-CM

## 2015-05-27 DIAGNOSIS — M5416 Radiculopathy, lumbar region: Secondary | ICD-10-CM

## 2015-05-27 DIAGNOSIS — Z79891 Long term (current) use of opiate analgesic: Secondary | ICD-10-CM

## 2015-05-27 DIAGNOSIS — F1190 Opioid use, unspecified, uncomplicated: Secondary | ICD-10-CM | POA: Diagnosis not present

## 2015-05-27 DIAGNOSIS — M533 Sacrococcygeal disorders, not elsewhere classified: Secondary | ICD-10-CM | POA: Diagnosis not present

## 2015-05-27 DIAGNOSIS — M961 Postlaminectomy syndrome, not elsewhere classified: Secondary | ICD-10-CM

## 2015-05-27 DIAGNOSIS — M5124 Other intervertebral disc displacement, thoracic region: Secondary | ICD-10-CM | POA: Insufficient documentation

## 2015-05-27 DIAGNOSIS — M159 Polyosteoarthritis, unspecified: Secondary | ICD-10-CM

## 2015-05-27 NOTE — Progress Notes (Signed)
Here for pump refill

## 2015-05-27 NOTE — Progress Notes (Signed)
Patient's Name: Tiffany Hunter MRN: [id number] DOB: Jan 12, 1946 DOS: 05/27/2015  Primary Reason(s) for Visit: Intrathecal Pump Refill. CC: Back Pain   Pre-Procedure Assessment: Ms. Garson is a 70 y.o. year old, female patient, seen today for interventional treatment. She has Abnormal cardiovascular stress test; Shortness of breath; CAD (coronary artery disease); Chronic pain; Chronic pain syndrome; Presence of intrathecal pump; Long term current use of opiate analgesic; Long term prescription opiate use; Opiate use; Opioid dependence ([place]); Encounter for adjustment or management of infusion pump; Presence of functional implant (Medtronic programmable intrathecal pump); Atypical chest pain; Arteriosclerosis of coronary artery; Carotid artery bruit; Chronic kidney disease (CKD), stage II (mild); Diabetes mellitus ([place]); Breathlessness on exertion; Difficult or painful urination; Benign hypertension; Combined fat and carbohydrate induced hyperlipemia; Arthritis, degenerative; 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 Her primarily concern today is the Back Pain Verification of the correct person, correct site (including marking of site), and correct procedure were performed and confirmed by the patient. Today's Vitals   05/27/15 1414 05/27/15 1415  BP: 128/62   Pulse: 79   Temp: 98.9 F (37.2 C)   TempSrc: Oral   Resp: 18   Height: 5' 2.5" (1.588 m)   Weight: 180 lb (81.647 kg)   SpO2: 96%   PainSc: 8  8   PainLoc: Back   Calculated BMI: Body mass index is 32.38 kg/(m^2). Allergies: She  is allergic to doxycycline hyclate; sulfa antibiotics; and sulfamethoxazole-trimethoprim.. Primary Diagnosis: Chronic pain syndrome [G89.4]  Today's Pain Score: 8 , clinically she looks like a 1-2/10. Reported level of pain is incompatible with clinical obrservations. This may be secondary to a possible lack of understanding on how the pain scale works. Pain Type: Chronic pain Pain Location: Back Pain Orientation: Lower Pain Descriptors / Indicators: Throbbing Pain Frequency: Intermittent  Date of Last Visit: Date of Last Visit: 03/19/15 Service Provided on Last Visit: Service Provided on Last Visit:  (pump refill)  Pharmacotherapy Review:   Side-effects or Adverse reactions: None reported. Effectiveness: Described as relatively effective, allowing for increase in activities of daily living (ADL). Onset of action: Within expected pharmacological parameters. Duration of action: Within normal limits for medication. Peak effect: Timing and results are as within normal expected parameters. [place] PMP: Compliant with practice rules and regulations. DST: Compliant with practice rules and regulations. Lab work: No new labs ordered by our practice. Treatment compliance: Compliant. Substance Use Disorder (SUD) Risk Level: Low Planned course of action: Continue therapy as is.  Intrathecal Pump Therapy: Side-effects or Adverse reactions: None reported. Effectiveness: Described as relatively effective, allowing for increase in activities of daily living (ADL). Plan: No changes in programming.  Procedure: Type: Palliative Intrathecal Pump Refill by Nursing Staff Region: Lower Abdominal Area Laterality: Left-Sided  Indications: Chronic Pain  Consent: Secured. Under the influence of no sedatives a written informed consent was obtained, after having provided information on the risks and possible complications. To fulfill our ethical and legal obligations, as recommended by the American Medical  Association's Code of Ethics, we have provided information to the patient about our clinical impression; the nature and purpose of the treatment or procedure; the risks, benefits, and possible complications of the intervention;  alternatives; the risk(s) and benefit(s) of the alternative treatment(s) or procedure(s); and the risk(s) and benefit(s) of doing nothing. The patient was provided information about the risks and possible complications associated with the procedure. These include, but are not limited to, failure to achieve desired goals, infection, bleeding, organ or nerve damage, allergic reactions, paralysis, and death. In addition, the patient was informed that Medicine is not an exact science; therefore, there is also the possibility of unforeseen risks and possible complications that may result in a catastrophic outcome. The patient indicated having understood very clearly. We have given the patient no guarantees and we have made no promises. Enough time was given to the patient to ask questions, all of which were answered to the patient's satisfaction.  Pre-Procedure Preparation: Safety Precautions: Allergies reviewed. Appropriate site, procedure, and patient were confirmed by following the Joint Commission's Universal Protocol (UP.01.01.01), in the form of a "Time Out". The patient was asked about blood thinners, or active infections, both of which were denied. Monitoring:  Clinical observation Infection Control Precautions: Sterile technique used. Standard Universal Precautions were taken as recommended by the Department of [facility] for Disease Control and Prevention (CDC). Standard pre-surgical skin prep was conducted. Respiratory hygiene and cough etiquette was practiced. Hand hygiene observed. Safe injection practices and needle disposal techniques followed. Medications properly checked for expiration dates and contaminants. Personal protective equipment (PPE) used: Sterile  surgical gloves.  Anesthesia, Analgesia, Anxiolysis: Type: None required Local Anesthetic(s): None used. IV Access: None. Sedation: Not necessary or indicated. Indication(s): None.  Description of Procedure Process:  Time-out: "Time-out" completed before starting procedure, as per protocol. Position: Supine Target Area: For Intrathecal Pump Refill(s), the target is the central port. Approach: Anterior, 90 degree angle approach. Area Prepped: Entire Area around the pump implant. Prepping solution: ChloraPrep (2% chlorhexidine gluconate and 70% isopropyl alcohol) Safety Precautions: Aspiration looking for blood return was conducted prior to all injections. At no point did we inject any substances, as a needle was being advanced. No attempts were made at seeking any paresthesias. Safe injection practices and needle disposal techniques used. Medications properly checked for expiration dates. SDV (single dose vial) medications used. Description of the Procedure: Protocol guidelines were followed. Two nurses trained to do implant refills were present during the entire procedure. The refill medication was checked by both healthcare providers as well as the patient. The patient was included in the "Time-out" to verify the medication. The patient was placed in position. The pump was identified. The area was prepped in the usual manner. The sterile template was positioned over the pump, making sure the side-port location matched that of the pump. Both, the pump and the template were held for stability. The needle provided in the Medtronic Kit was then introduced thru the center of the template and into the central port. The pump content was aspirated and discarded volume documented. The new medication was slowly infused into the pump, thru the filter, making sure to avoid overpressure of the device. The needle was then removed and the area cleansed, making sure to leave some of the prepping solution back to  take advantage of its long term bactericidal properties. The pump was interrogated and programmed to reflect the correct medication, volume, and dosage. The program was printed and taken to the physician for approval. Once checked and signed by the physician, a copy was provided to the patient and another scanned into the EMR. EBL: None Materials Used:  Medtronic Refill Kit  Post-procedure Assessment:  Complications: Uneventful. Disposition: Return to clinic in 3 to4 months for refill. (See printout) Comments:  No additional relevant information.  Assessment: Encounter Diagnosis:  Primary Diagnosis: Chronic pain syndrome [G89.4] Plan: Dallie was seen today for back pain.  Diagnoses and all orders for this visit:  Chronic pain syndrome -     PUMP REFILL  Long term current use of opiate analgesic  Encounter for adjustment or management of infusion pump  Primary osteoarthritis involving multiple joints  Osteoarthritis of spine with radiculopathy, lumbar region  Failed back surgical syndrome  Chronic radicular lumbar pain (Left)  Coccygodynia  Chronic hip pain (Left)  Primary osteoarthritis of left hip  Chronic knee pain (Left)  Primary osteoarthritis of left knee  Thoracic bulging disc without myelopathy   Patient instructions:  Patient Instructions  Narcotic Overdose A narcotic overdose is the misuse or overuse of a narcotic drug. A narcotic overdose can make you pass out and stop breathing. If you are not treated right away, this can cause permanent brain damage or stop your heart. Medicine may be given to reverse the effects of an overdose. If so, this medicine may bring on withdrawal symptoms. The symptoms may be abdominal cramps, throwing up (vomiting), sweating, chills, and nervousness. Injecting narcotics can cause more problems than just an overdose. AIDS, hepatitis, and other very serious infections are transmitted by sharing needles and syringes. If you decide  to quit using, there are medicines which can help you through the withdrawal period. Trying to quit all at once on your own can be uncomfortable, but not life-threatening. Call your caregiver, Narcotics Anonymous, or any drug and alcohol treatment program for further help.    This information is not intended to replace advice given to you by your health care provider. Make sure you discuss any questions you have with your health care provider.   Document Released: 06/09/2004 Document Revised: 05/23/2014 Document Reviewed: 10/22/2014 Elsevier Interactive Patient Education 2016 [place] Physician: Venetia Maxon, DO Location: [facility] Management Facility Attending Physician: Gaspar Cola, MD

## 2015-05-27 NOTE — Patient Instructions (Signed)
Narcotic Overdose °A narcotic overdose is the misuse or overuse of a narcotic drug. A narcotic overdose can make you pass out and stop breathing. If you are not treated right away, this can cause permanent brain damage or stop your heart. Medicine may be given to reverse the effects of an overdose. If so, this medicine may bring on withdrawal symptoms. The symptoms may be abdominal cramps, throwing up (vomiting), sweating, chills, and nervousness. °Injecting narcotics can cause more problems than just an overdose. AIDS, hepatitis, and other very serious infections are transmitted by sharing needles and syringes. If you decide to quit using, there are medicines which can help you through the withdrawal period. Trying to quit all at once on your own can be uncomfortable, but not life-threatening. Call your caregiver, Narcotics Anonymous, or any drug and alcohol treatment program for further help.  °  °This information is not intended to replace advice given to you by your health care provider. Make sure you discuss any questions you have with your health care provider. °  °Document Released: 06/09/2004 Document Revised: 05/23/2014 Document Reviewed: 10/22/2014 °Elsevier Interactive Patient Education ©2016 Elsevier Inc. ° °

## 2015-05-29 MED FILL — Medication: Qty: 1 | Status: AC

## 2015-07-17 ENCOUNTER — Other Ambulatory Visit: Payer: Self-pay

## 2015-07-17 MED ORDER — PAIN MANAGEMENT IT PUMP REFILL: 1.0000 | Freq: Once | INTRATHECAL | Status: DC

## 2015-08-11 ENCOUNTER — Ambulatory Visit: Payer: Medicare Other | Attending: Pain Medicine | Admitting: Pain Medicine

## 2015-08-11 ENCOUNTER — Encounter: Payer: Self-pay | Admitting: Pain Medicine

## 2015-08-11 VITALS — BP 143/75 | HR 88 | Temp 97.9°F | Resp 18 | Ht 62.0 in | Wt 184.0 lb

## 2015-08-11 DIAGNOSIS — Z6833 Body mass index (BMI) 33.0-33.9, adult: Secondary | ICD-10-CM | POA: Insufficient documentation

## 2015-08-11 DIAGNOSIS — Z9689 Presence of other specified functional implants: Secondary | ICD-10-CM | POA: Insufficient documentation

## 2015-08-11 DIAGNOSIS — I2510 Atherosclerotic heart disease of native coronary artery without angina pectoris: Secondary | ICD-10-CM | POA: Diagnosis not present

## 2015-08-11 DIAGNOSIS — M549 Dorsalgia, unspecified: Secondary | ICD-10-CM | POA: Diagnosis present

## 2015-08-11 DIAGNOSIS — G8929 Other chronic pain: Secondary | ICD-10-CM | POA: Insufficient documentation

## 2015-08-11 DIAGNOSIS — R0602 Shortness of breath: Secondary | ICD-10-CM | POA: Diagnosis not present

## 2015-08-11 DIAGNOSIS — M7710 Lateral epicondylitis, unspecified elbow: Secondary | ICD-10-CM | POA: Diagnosis not present

## 2015-08-11 DIAGNOSIS — M545 Low back pain: Secondary | ICD-10-CM | POA: Diagnosis not present

## 2015-08-11 DIAGNOSIS — E7801 Familial hypercholesterolemia: Secondary | ICD-10-CM | POA: Diagnosis not present

## 2015-08-11 DIAGNOSIS — F1190 Opioid use, unspecified, uncomplicated: Secondary | ICD-10-CM

## 2015-08-11 DIAGNOSIS — Z969 Presence of functional implant, unspecified: Secondary | ICD-10-CM

## 2015-08-11 DIAGNOSIS — M533 Sacrococcygeal disorders, not elsewhere classified: Secondary | ICD-10-CM | POA: Diagnosis not present

## 2015-08-11 DIAGNOSIS — E1122 Type 2 diabetes mellitus with diabetic chronic kidney disease: Secondary | ICD-10-CM | POA: Insufficient documentation

## 2015-08-11 DIAGNOSIS — I272 Other secondary pulmonary hypertension: Secondary | ICD-10-CM | POA: Diagnosis not present

## 2015-08-11 DIAGNOSIS — R0789 Other chest pain: Secondary | ICD-10-CM | POA: Insufficient documentation

## 2015-08-11 DIAGNOSIS — M1612 Unilateral primary osteoarthritis, left hip: Secondary | ICD-10-CM | POA: Diagnosis not present

## 2015-08-11 DIAGNOSIS — R300 Dysuria: Secondary | ICD-10-CM | POA: Insufficient documentation

## 2015-08-11 DIAGNOSIS — M1712 Unilateral primary osteoarthritis, left knee: Secondary | ICD-10-CM | POA: Insufficient documentation

## 2015-08-11 DIAGNOSIS — R9439 Abnormal result of other cardiovascular function study: Secondary | ICD-10-CM | POA: Insufficient documentation

## 2015-08-11 DIAGNOSIS — Z79891 Long term (current) use of opiate analgesic: Secondary | ICD-10-CM | POA: Insufficient documentation

## 2015-08-11 DIAGNOSIS — Z451 Encounter for adjustment and management of infusion pump: Secondary | ICD-10-CM | POA: Diagnosis not present

## 2015-08-11 DIAGNOSIS — M47896 Other spondylosis, lumbar region: Secondary | ICD-10-CM | POA: Insufficient documentation

## 2015-08-11 DIAGNOSIS — Z978 Presence of other specified devices: Secondary | ICD-10-CM

## 2015-08-11 DIAGNOSIS — R0989 Other specified symptoms and signs involving the circulatory and respiratory systems: Secondary | ICD-10-CM | POA: Insufficient documentation

## 2015-08-11 DIAGNOSIS — G894 Chronic pain syndrome: Secondary | ICD-10-CM

## 2015-08-11 NOTE — Progress Notes (Signed)
Safety precautions to be maintained throughout the outpatient stay will include: orient to surroundings, keep bed in low position, maintain call bell within reach at all times, provide assistance with transfer out of bed and ambulation.  

## 2015-08-11 NOTE — Progress Notes (Addendum)
Patient's Name: Tiffany Hunter MRN: [id number] DOB: 07/06/1945 DOS: 08/11/2015  Primary Reason(s) for Visit: Intrathecal Pump Refill. CC: Back Pain   Procedure  Type: Palliative Intrathecal Pump Refill by Nursing Staff Region: Lower Abdominal Area Laterality: Left  Indications: Chronic Pain  Pre-Procedure Assessment:  Tiffany Hunter is a 70 y.o. year old, female patient, seen today for interventional treatment. She has Abnormal cardiovascular stress test; Shortness of breath; CAD (coronary artery disease); Chronic pain syndrome; Presence of intrathecal pump; Long term current use of opiate analgesic; Long term prescription opiate use; Opiate use; Opioid dependence ([place]); Encounter for adjustment or management of infusion pump; Presence of functional implant (Medtronic programmable intrathecal pump); Atypical chest pain; Arteriosclerosis of coronary artery; Carotid artery bruit; Chronic kidney disease (CKD), stage II (mild); Diabetes mellitus ([place]); Breathlessness on exertion; Difficult or painful urination; Benign hypertension; Combined fat and carbohydrate induced hyperlipemia; Arthritis, degenerative; 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 Her primarily concern today is the Back Pain   Today's Initial Pain Score: 0/10 Reported level of pain is compatible with clinical observation Pain Type: Chronic pain Pain Location: Back Pain Orientation: Lower  Post-procedure Pain Score: 0-No pain  Date of Last Visit: 05/27/15 Service Provided on Last Visit: Procedure (pump  refill)  Verification of the correct person, correct site (including marking of site), and correct procedure were performed and confirmed by the patient.  Today's Vitals   08/11/15 1342 08/11/15 1344  BP:  143/75  Pulse: 88   Temp: 97.9 F (36.6 C)   Resp: 18   Height: '5\' 2"'  (1.575 m)   Weight: 184 lb (83.462 kg)   SpO2: 96%   PainSc: 0-No pain 0-No pain  PainLoc: Back   Calculated BMI: Body mass index is 33.65 kg/(m^2). Allergies: She is allergic to doxycycline hyclate; sulfa antibiotics; and sulfamethoxazole-trimethoprim.. Primary Diagnosis: Chronic pain [G89.29]  Consent: Secured. Under the influence of no sedatives a written informed consent was obtained, after having provided information on the risks and possible complications. To fulfill our ethical and legal obligations, as recommended by the American Medical Association's Code of Ethics, we have provided information to the patient about our clinical impression; the nature and purpose of the treatment or procedure; the risks, benefits, and possible complications of the intervention; alternatives; the risk(s) and benefit(s) of the alternative treatment(s) or procedure(s); and the risk(s) and benefit(s) of doing nothing. The patient was provided information about the risks and possible complications associated with the procedure. These include, but are not limited to, failure to achieve desired goals, infection, bleeding, organ or nerve damage, allergic reactions, paralysis, and death. In addition, the patient was informed that Medicine is not an exact science; therefore, there is also the possibility of unforeseen risks and possible complications that may result in a catastrophic outcome. The patient indicated having understood very clearly. We have given the patient no guarantees and we have made no promises. Enough time was given to the patient to ask questions, all of which were answered to the patient's satisfaction.  Pre-Procedure  Preparation: Safety Precautions: Allergies reviewed. Appropriate site, procedure, and patient were confirmed by following the Joint Commission's Universal Protocol (UP.01.01.01), in the form of a "Time Out".  The patient was asked about blood thinners, or active infections, both of which were denied. Monitoring:  Clinical observation Infection Control Precautions: Sterile technique used. Standard Universal Precautions were taken as recommended by the Department of [facility] for Disease Control and Prevention (CDC). Standard pre-surgical skin prep was conducted. Respiratory hygiene and cough etiquette was practiced. Hand hygiene observed. Safe injection practices and needle disposal techniques followed. Medications properly checked for expiration dates and contaminants. Personal protective equipment (PPE) used: Sterile surgical gloves.  Controlled Substance Pharmacotherapy Assessment  Analgesic: Intrathecal pump. No oral prescriptions from [place]. Pill Count: None applicable Pharmacokinetics: N/A Pharmacodynamics: N/A Monitoring: N/A Risk Assessment: Aberrant Behavior: None observed today and     Substance Use Disorder (SUD) Risk Level: Low Opioid Risk Tool (ORT) Score: Total Score: 3 Low Risk for SUD (Score <3) Depression Scale Score: PHQ-2: PHQ-2 Total Score: 0 No depression (0) PHQ-9: PHQ-9 Total Score: 0 No depression (0-4)  Pharmacologic Plan: No change in therapy, at this time  Intrathecal Pump Therapy: Side-effects or Adverse reactions: None reported. Effectiveness: Described as relatively effective, allowing for increase in activities of daily living (ADL). Plan: No changes in programming. Pump refill today without any problems.  Anesthesia, Analgesia, Anxiolysis:  Type: None required Local Anesthetic(s): None used. IV Access: None. Sedation: Not necessary or indicated. Indication(s): None.  Description of Procedure Process:   Time-out: "Time-out" completed before  starting procedure, as per protocol. Position: Supine Target Area: For Intrathecal Pump Refill(s), the target is the central port. Approach: Anterior, 90 degree angle approach. Area Prepped: Entire Area around the pump implant. Prepping solution: ChloraPrep (2% chlorhexidine gluconate and 70% isopropyl alcohol) Safety Precautions: Aspiration looking for blood return was conducted prior to all injections. At no point did we inject any substances, as a needle was being advanced. No attempts were made at seeking any paresthesias. Safe injection practices and needle disposal techniques used. Medications properly checked for expiration dates. SDV (single dose vial) medications used. Description of the Procedure: Protocol guidelines were followed. Two nurses trained to do implant refills were present during the entire procedure. The refill medication was checked by both healthcare providers as well as the patient. The patient was included in the "Time-out" to verify the medication. The patient was placed in position. The pump was identified. The area was prepped in the usual manner. The sterile template was positioned over the pump, making sure the side-port location matched that of the pump. Both, the pump and the template were held for stability. The needle provided in the Medtronic Kit was then introduced thru the center of the template and into the central port. The pump content was aspirated and discarded volume documented. The new medication was slowly infused into the pump, thru the filter, making sure to avoid overpressure of the device. The needle was then removed and the area cleansed, making sure to leave some of the prepping solution back to take advantage of its long term bactericidal properties. The pump was interrogated and programmed to reflect the correct medication, volume, and dosage. The program was printed and taken to the physician for approval. Once checked and signed by the physician, a copy  was provided to the patient and another scanned into the EMR. EBL: None Materials Used:  Medtronic Refill Kit  Post-procedure Assessment:  Complications: Uneventful. Disposition: Return to clinic in 3 to4 months for refill. (See printout) Comments:  No additional relevant information.  Assessment: Encounter Diagnosis:  Primary Diagnosis: Chronic pain [G89.29]  Plan: Tiffany Hunter was  seen today for back pain.  Diagnoses and all orders for this visit:  Chronic pain  Encounter for adjustment or management of infusion pump  Long term current use of opiate analgesic  Opiate use  Presence of functional implant (Medtronic programmable intrathecal pump) -     PUMP REFILL  Presence of intrathecal pump  Chronic pain syndrome -     PUMP REFILL    Patient instructions:  Patient Instructions  Narcotic Overdose A narcotic overdose is the misuse or overuse of a narcotic drug. A narcotic overdose can make you pass out and stop breathing. If you are not treated right away, this can cause permanent brain damage or stop your heart. Medicine may be given to reverse the effects of an overdose. If so, this medicine may bring on withdrawal symptoms. The symptoms may be abdominal cramps, throwing up (vomiting), sweating, chills, and nervousness. Injecting narcotics can cause more problems than just an overdose. AIDS, hepatitis, and other very serious infections are transmitted by sharing needles and syringes. If you decide to quit using, there are medicines which can help you through the withdrawal period. Trying to quit all at once on your own can be uncomfortable, but not life-threatening. Call your caregiver, Narcotics Anonymous, or any drug and alcohol treatment program for further help.    This information is not intended to replace advice given to you by your health care provider. Make sure you discuss any questions you have with your health care provider.   Document Released: 06/09/2004 Document  Revised: 05/23/2014 Document Reviewed: 10/22/2014 Elsevier Interactive Patient Education 2016 [place] Physician: Venetia Maxon, DO Location: [facility] Outpatient Pain Management Facility Attending Physician: Gaspar Cola, MD

## 2015-08-11 NOTE — Patient Instructions (Signed)
Narcotic Overdose °A narcotic overdose is the misuse or overuse of a narcotic drug. A narcotic overdose can make you pass out and stop breathing. If you are not treated right away, this can cause permanent brain damage or stop your heart. Medicine may be given to reverse the effects of an overdose. If so, this medicine may bring on withdrawal symptoms. The symptoms may be abdominal cramps, throwing up (vomiting), sweating, chills, and nervousness. °Injecting narcotics can cause more problems than just an overdose. AIDS, hepatitis, and other very serious infections are transmitted by sharing needles and syringes. If you decide to quit using, there are medicines which can help you through the withdrawal period. Trying to quit all at once on your own can be uncomfortable, but not life-threatening. Call your caregiver, Narcotics Anonymous, or any drug and alcohol treatment program for further help.  °  °This information is not intended to replace advice given to you by your health care provider. Make sure you discuss any questions you have with your health care provider. °  °Document Released: 06/09/2004 Document Revised: 05/23/2014 Document Reviewed: 10/22/2014 °Elsevier Interactive Patient Education ©2016 Elsevier Inc. ° °

## 2015-08-17 NOTE — Addendum Note (Signed)
Addended by: Milinda Pointer A on: 08/17/2015 10:05 AM   Modules accepted: Orders

## 2015-08-18 MED FILL — Medication: Qty: 1 | Status: AC

## 2015-10-09 ENCOUNTER — Telehealth: Payer: Self-pay | Admitting: *Deleted

## 2015-10-09 NOTE — Telephone Encounter (Signed)
sw pt she is aware that come in on 10/27/15@11am  instead of 2pm..Marland KitchenTD

## 2015-10-21 ENCOUNTER — Other Ambulatory Visit: Payer: Self-pay

## 2015-10-21 MED ORDER — PAIN MANAGEMENT IT PUMP REFILL: 1.0000 | Freq: Once | INTRATHECAL | Status: DC

## 2015-10-27 ENCOUNTER — Ambulatory Visit: Payer: Medicare Other | Attending: Pain Medicine | Admitting: Pain Medicine

## 2015-10-27 ENCOUNTER — Encounter: Payer: Self-pay | Admitting: Pain Medicine

## 2015-10-27 VITALS — BP 142/60 | HR 83 | Temp 98.5°F | Resp 18 | Ht 62.0 in | Wt 185.0 lb

## 2015-10-27 DIAGNOSIS — G894 Chronic pain syndrome: Secondary | ICD-10-CM

## 2015-10-27 NOTE — Progress Notes (Signed)
Safety precautions to be maintained throughout the outpatient stay will include: orient to surroundings, keep bed in low position, maintain call bell within reach at all times, provide assistance with transfer out of bed and ambulation.  

## 2015-10-27 NOTE — Progress Notes (Signed)
Patient ID: Tiffany Hunter, female   DOB: 19-Mar-1946, 70 y.o.   MRN: [id number] Appointment to refill them intrathecal pump was reschedule due to the fact that the medication for the pump was not available. We have reorder dated and asked the patient to return for the refill. We will not pill the patient for this visit.

## 2015-10-29 ENCOUNTER — Other Ambulatory Visit
Admission: RE | Admit: 2015-10-29 | Discharge: 2015-10-29 | Disposition: A | Discharge status: Home / Self Care | Payer: Medicare Other | Source: Ambulatory Visit | Attending: Pain Medicine | Admitting: Pain Medicine

## 2015-10-29 ENCOUNTER — Ambulatory Visit (HOSPITAL_BASED_OUTPATIENT_CLINIC_OR_DEPARTMENT_OTHER): Payer: Medicare Other | Admitting: Pain Medicine

## 2015-10-29 ENCOUNTER — Ambulatory Visit
Admission: RE | Admit: 2015-10-29 | Discharge: 2015-10-29 | Disposition: A | Discharge status: Home / Self Care | Payer: Medicare Other | Source: Ambulatory Visit | Attending: Pain Medicine | Admitting: Pain Medicine

## 2015-10-29 ENCOUNTER — Encounter: Payer: Self-pay | Admitting: Pain Medicine

## 2015-10-29 VITALS — BP 89/63 | HR 78 | Temp 98.5°F | Resp 18 | Ht 62.0 in | Wt 180.0 lb

## 2015-10-29 DIAGNOSIS — Z79891 Long term (current) use of opiate analgesic: Secondary | ICD-10-CM | POA: Insufficient documentation

## 2015-10-29 DIAGNOSIS — R531 Weakness: Secondary | ICD-10-CM

## 2015-10-29 DIAGNOSIS — I509 Heart failure, unspecified: Secondary | ICD-10-CM | POA: Insufficient documentation

## 2015-10-29 DIAGNOSIS — R209 Unspecified disturbances of skin sensation: Secondary | ICD-10-CM | POA: Diagnosis not present

## 2015-10-29 DIAGNOSIS — R0609 Other forms of dyspnea: Secondary | ICD-10-CM

## 2015-10-29 DIAGNOSIS — I2721 Secondary pulmonary arterial hypertension: Secondary | ICD-10-CM

## 2015-10-29 DIAGNOSIS — G8929 Other chronic pain: Secondary | ICD-10-CM

## 2015-10-29 DIAGNOSIS — I25118 Atherosclerotic heart disease of native coronary artery with other forms of angina pectoris: Secondary | ICD-10-CM | POA: Diagnosis not present

## 2015-10-29 DIAGNOSIS — M545 Low back pain: Principal | ICD-10-CM

## 2015-10-29 DIAGNOSIS — R0681 Apnea, not elsewhere classified: Secondary | ICD-10-CM

## 2015-10-29 DIAGNOSIS — R0602 Shortness of breath: Secondary | ICD-10-CM

## 2015-10-29 DIAGNOSIS — I272 Other secondary pulmonary hypertension: Secondary | ICD-10-CM | POA: Insufficient documentation

## 2015-10-29 DIAGNOSIS — Z451 Encounter for adjustment and management of infusion pump: Secondary | ICD-10-CM

## 2015-10-29 DIAGNOSIS — G894 Chronic pain syndrome: Secondary | ICD-10-CM

## 2015-10-29 DIAGNOSIS — M5416 Radiculopathy, lumbar region: Secondary | ICD-10-CM

## 2015-10-29 DIAGNOSIS — Z969 Presence of functional implant, unspecified: Secondary | ICD-10-CM

## 2015-10-29 DIAGNOSIS — F1190 Opioid use, unspecified, uncomplicated: Secondary | ICD-10-CM

## 2015-10-29 DIAGNOSIS — M533 Sacrococcygeal disorders, not elsewhere classified: Secondary | ICD-10-CM

## 2015-10-29 DIAGNOSIS — M4327 Fusion of spine, lumbosacral region: Secondary | ICD-10-CM | POA: Insufficient documentation

## 2015-10-29 DIAGNOSIS — M79605 Pain in left leg: Secondary | ICD-10-CM

## 2015-10-29 DIAGNOSIS — N182 Chronic kidney disease, stage 2 (mild): Secondary | ICD-10-CM

## 2015-10-29 DIAGNOSIS — Z978 Presence of other specified devices: Secondary | ICD-10-CM

## 2015-10-29 DIAGNOSIS — Z9689 Presence of other specified functional implants: Secondary | ICD-10-CM

## 2015-10-29 LAB — COMPREHENSIVE METABOLIC PANEL WITH GFR
ALT: 23 U/L (ref 14–54)
AST: 34 U/L (ref 15–41)
Albumin: 4.2 g/dL (ref 3.5–5.0)
Alkaline Phosphatase: 69 U/L (ref 38–126)
Anion gap: 8 (ref 5–15)
BUN: 9 mg/dL (ref 6–20)
CO2: 31 mmol/L (ref 22–32)
Calcium: 8.9 mg/dL (ref 8.9–10.3)
Chloride: 101 mmol/L (ref 101–111)
Creatinine, Ser: 0.45 mg/dL (ref 0.44–1.00)
GFR calc Af Amer: >60 mL/min
GFR calc non Af Amer: >60 mL/min
Glucose, Bld: 92 mg/dL (ref 65–99)
Potassium: 3.7 mmol/L (ref 3.5–5.1)
Sodium: 140 mmol/L (ref 135–145)
Total Bilirubin: 0.9 mg/dL (ref 0.3–1.2)
Total Protein: 7 g/dL (ref 6.5–8.1)

## 2015-10-29 LAB — CBC WITH DIFFERENTIAL/PLATELET
Basophils Absolute: 0.1 10*3/uL (ref 0–0.1)
Basophils Relative: 1 %
EOS ABS: 0.5 10*3/uL (ref 0–0.7)
EOS PCT: 6 %
HCT: 40.4 % (ref 35.0–47.0)
Hemoglobin: 13.5 g/dL (ref 12.0–16.0)
LYMPHS ABS: 3.3 10*3/uL (ref 1.0–3.6)
LYMPHS PCT: 36 %
MCH: 29.9 pg (ref 26.0–34.0)
MCHC: 33.5 g/dL (ref 32.0–36.0)
MCV: 89.2 fL (ref 80.0–100.0)
MONO ABS: 0.5 10*3/uL (ref 0.2–0.9)
MONOS PCT: 6 %
Neutro Abs: 4.6 10*3/uL (ref 1.4–6.5)
Neutrophils Relative %: 51 %
PLATELETS: 161 10*3/uL (ref 150–440)
RBC: 4.54 MIL/uL (ref 3.80–5.20)
RDW: 13.1 % (ref 11.5–14.5)
WBC: 9 10*3/uL (ref 3.6–11.0)

## 2015-10-29 LAB — BRAIN NATRIURETIC PEPTIDE: B Natriuretic Peptide: 53 pg/mL (ref 0.0–100.0)

## 2015-10-29 LAB — C-REACTIVE PROTEIN: CRP: 0.9 mg/dL

## 2015-10-29 LAB — VITAMIN B12: Vitamin B-12: 329 pg/mL (ref 180–914)

## 2015-10-29 LAB — SEDIMENTATION RATE: SED RATE: 16 mm/h (ref 0–30)

## 2015-10-29 LAB — MAGNESIUM: MAGNESIUM: 1.8 mg/dL (ref 1.7–2.4)

## 2015-10-29 MED ORDER — LIDOCAINE HCL (PF) 1 % IJ SOLN
INTRAMUSCULAR | Status: AC
  Filled 2015-10-29: qty 5

## 2015-10-29 NOTE — Progress Notes (Signed)
Patient's Name: Tiffany Hunter  Patient type: Established  MRN: [id number]  Service setting: Ambulatory outpatient  DOB: May 11, 1946  Location: [facility]  DOS: 10/29/2015  Primary Care Physician: Venetia Maxon, DO  Note by: Kathlen Brunswick. Dossie Arbour, M.D, DABA, DABAPM, DABPM, DABIPP, FIPP  Referring Physician: Venetia Maxon, DO  Specialty: Board-Certified Interventional Pain Management  Last Visit to Pain Management: 10/27/2015   Primary Reason(s) for Visit: Encounter for Intrathecal pump management. CC: Back Pain   HPI  Tiffany Hunter is a 70 y.o. year old, female patient, who returns today as an established patient. She has Abnormal cardiovascular stress test; Shortness of breath; CAD (coronary artery disease); Chronic pain syndrome; Presence of intrathecal pump; Long term current use of opiate analgesic; Long term prescription opiate use; Opiate use; Encounter for adjustment or management of infusion pump; Presence of functional implant (Medtronic programmable intrathecal pump); Atypical chest pain; Arteriosclerosis of coronary artery; Carotid artery bruit; Chronic kidney disease (CKD), stage II (mild); Diabetes mellitus ([place]); Breathlessness on exertion; Difficult or painful urination; Benign hypertension; Combined fat and carbohydrate induced hyperlipemia; 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[place]); Chronic low back pain (Location of Secondary source of pain) (Left); Backhand tennis elbow; Menopausal symptom; Osteoarthritis; Lumbar spondylosis; Failed back surgical syndrome; Chronic lumbar radicular pain (L4 Dermatome) (Left); Coccygodynia; Chronic hip pain (Left); Osteoarthritis of hip (Left); Chronic knee pain (Left); Osteoarthritis of knee (Left); 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 Her primarily concern today is the Back Pain   Pain Assessment: Self-Reported Pain Score: 8 , clinically she looks like a 2-3/10 Reported level is inconsistent with clinical obrservations Information on the proper use of the pain score provided to the patient today. Pain Type: Chronic pain Pain Location: Back Pain Orientation: Lower Pain Descriptors / Indicators: Aching, Dull Pain Frequency: Constant  The patient comes into the clinics today for pharmacological management of her chronic pain. I last saw this patient on 10/27/2015. The patient  reports that she does not use illicit drugs. Her body mass index is 32.91 kg/(m^2).  Date of Last Visit: 10/27/15 Service Provided on Last Visit:  (was here for pump refill, but pump med was not here)  Controlled Substance Pharmacotherapy Assessment & REMS (Risk Evaluation and Mitigation Strategy)  Analgesic: Oral: Tramadol (Ultram) 50 mg every 6 hours (200 mg/day). Intrathecal: PF-hydromorphone 8.0 mg/mL + PF-sufentanil 70 g/mL. Running with a simple continuous infusion at 3.5704 mg/day of hydromorphone +31.241 g/day of sufentanil. MME/day: 586.8 mg/day Pharmacokinetics: N/A Pharmacodynamics: N/A Side-effects or Adverse reactions: None reported Risk Assessment: Aberrant Behavior: None observed today Substance Use Disorder (SUD) Risk Level: Low Risk of opioid abuse or dependence: 0.7-3.0% with doses ? 36 MME/day and 6.1-26% with doses ? 120 MME/day. Opioid Risk Tool (ORT) Score:  0 Low Risk for SUD (Score <3) Depression Scale Score: PHQ-2: PHQ-2 Total Score: 0 No depression (0) PHQ-9: PHQ-9 Total Score: 0 No depression (0-4)  Pharmacologic Plan: No change in therapy, at this time  Laboratory Chemistry  Inflammation Markers Lab Results  Component Value Date   ESRSEDRATE 16 10/29/2015   CRP 0.9 10/29/2015    Renal Function Lab Results  Component Value Date   BUN 9  10/29/2015   CREATININE 0.45 10/29/2015   GFRAA >60 10/29/2015   GFRNONAA >60 10/29/2015    Hepatic  Function Lab Results  Component Value Date   AST 34 10/29/2015   ALT 23 10/29/2015   ALBUMIN 4.2 10/29/2015    Electrolytes Lab Results  Component Value Date   NA 140 10/29/2015   K 3.7 10/29/2015   CL 101 10/29/2015   CALCIUM 8.9 10/29/2015   MG 1.8 10/29/2015    Pain Modulating Vitamins Lab Results  Component Value Date   VITAMINB12 329 10/29/2015    Coagulation Parameters Lab Results  Component Value Date   INR 1.1* 12/16/2010   LABPROT 12.1 12/16/2010   PLT 161 10/29/2015    Note: Labs Reviewed.  Recent Diagnostic Imaging  No results found.  Meds  The patient has a current medication list which includes the following prescription(s): albuterol sulfate, aspirin, atorvastatin, carvedilol, estradiol, PAIN MANAGEMENT IT PUMP REFILL, proair respiclick, proctozone-hc, tramadol, and vitamin d (ergocalciferol), and the following Facility-Administered Medications: lidocaine (pf).  Current Outpatient Prescriptions on File Prior to Visit  Medication Sig  . Albuterol Sulfate (PROAIR RESPICLICK) 326 (90 Base) MCG/ACT AEPB   . aspirin 81 MG tablet Take 81 mg by mouth daily.  Marland Kitchen atorvastatin (LIPITOR) 20 MG tablet Take 20 mg by mouth daily.  . carvedilol (COREG) 3.125 MG tablet Take 3.125 mg by mouth daily.   Marland Kitchen estradiol (ESTRACE) 0.5 MG tablet Take 0.5 mg by mouth daily.   Marland Kitchen PAIN MANAGEMENT IT PUMP REFILL 1 each by Intrathecal route once. Medication: PF Hydromorphone 8.0 mg/ml PF Sufentanil 70.0 mcg/ml   Total Volume: 40 ml Needed by 10-27-15 @ 1400  . PROAIR RESPICLICK 712 (90 Base) MCG/ACT AEPB Reported on 08/11/2015  . PROCTOZONE-HC 2.5 % rectal cream Reported on 10/27/2015  . traMADol (ULTRAM) 50 MG tablet Take 50 mg by mouth every 6 (six) hours as needed.    . Vitamin D, Ergocalciferol, (DRISDOL) 50000 units CAPS capsule Take 50,000 Units by mouth every 7 (seven)  days.    No current facility-administered medications on file prior to visit.    ROS  Constitutional: Denies any fever or chills Gastrointestinal: No reported hemesis, hematochezia, vomiting, or acute GI distress Musculoskeletal: Denies any acute onset joint swelling, redness, loss of ROM, or weakness Neurological: No reported episodes of acute onset apraxia, aphasia, dysarthria, agnosia, amnesia, paralysis, loss of coordination, or loss of consciousness  Allergies  Tiffany Hunter is allergic to doxycycline hyclate; sulfa antibiotics; and sulfamethoxazole-trimethoprim.  [place]  Medical:  Tiffany Hunter  has a past medical history of Barrett's esophagus; Colonic polyp; Low back pain; Hyperlipidemia; Ovarian failure; GERD (gastroesophageal reflux disease); Exogenous obesity; Sinusitis; Arthritis; Chest wall pain; Carotid artery bruit; Heart murmur; Osteoporosis; Asthma; Abnormal EKG; Weakness; H/O: hysterectomy; CAD (coronary artery disease); Cataract; and Cancer ([place]). Family: family history includes Cancer in her mother; Heart disease in her father. Surgical:  has past surgical history that includes Carpal tunnel release; Back surgery; Breast surgery (Bilateral); Cholecystectomy; Colonoscopy w/ polypectomy; Cataract extraction w/ intraocular lens implant (Right); Fracture surgery (Right); and Abdominal hysterectomy. Tobacco:  reports that she has never smoked. She does not have any smokeless tobacco history on file. Alcohol:  reports that she does not drink alcohol. Drug:  reports that she does not use illicit drugs.  Constitutional Exam  Vitals: Blood pressure 89/63, pulse 78, temperature 98.5 F (36.9 C), resp. rate 18, height '5\' 2"'  (1.575 m), weight 180 lb (81.647 kg), SpO2 96 %. General appearance: Well nourished, well developed, and well hydrated. In no acute distress Calculated BMI/Body habitus: Body mass index is 32.91 kg/(m^2). (30-34.9 kg/m2)  Obese (Class I) - 68% higher incidence of  chronic pain Psych/Mental status: Alert and oriented x 3 (person, place, & time) Eyes: PERLA Respiratory: No evidence of acute respiratory distress  Cervical Spine Exam  Inspection: No masses, redness, or swelling Alignment: Symmetrical ROM: Functional: ROM is within functional limits [facility]) Stability: No instability detected Muscle strength & Tone: Functionally intact Sensory: Unimpaired Palpation: No complaints of tenderness  Upper Extremity (UE) Exam    Side: Right upper extremity  Side: Left upper extremity  Inspection: No masses, redness, swelling, or asymmetry  Inspection: No masses, redness, swelling, or asymmetry  ROM:  ROM:  Functional: ROM is within functional limits [facility])  Functional: ROM is within functional limits [facility])  Muscle strength & Tone: Functionally intact  Muscle strength & Tone: Functionally intact  Sensory: Unimpaired  Sensory: Unimpaired  Palpation: Non-contributory  Palpation: Non-contributory   Thoracic Spine Exam  Inspection: No masses, redness, or swelling Alignment: Symmetrical ROM: Functional: ROM is within functional limits [facility]) Stability: No instability detected Sensory: Unimpaired Muscle strength & Tone: Functionally intact Palpation: No complaints of tenderness  Lumbar Spine Exam  Inspection: No masses, redness, or swelling. Evidence of a well healed surgical scar present where the lumbar spine. Alignment: Symmetrical ROM: Functional: Decreased ROM Stability: No instability detected Muscle strength & Tone: Functionally intact Sensory: Unimpaired Palpation: Tender Provocative Tests: Lumbar Hyperextension and rotation test: Positive for left-sided lumbar facet pain Patrick's Maneuver: Positive for left-sided sacroiliac joint and hip joint pain.  Gait & Posture Assessment  Ambulation: Patient ambulates using a cane Gait: Antalgic Posture: Poor  Lower Extremity Exam    Side: Right lower extremity  Side: Left lower extremity   Inspection: No masses, redness, swelling, or asymmetry ROM:  Inspection: No masses, redness, swelling, or asymmetry ROM:  Functional: ROM is within functional limits [facility])  Functional: ROM is within functional limits [facility])  Muscle strength & Tone: Functionally intact  Muscle strength & Tone: Functionally intact  Sensory: Unimpaired  Sensory: Pain pattern appears Referred, possibly dermatomal.   Palpation: Non-contributory  Palpation: Non-contributory   Procedure:  Intrathecal Drug Delivery System (IDDS):  Type: [place] Refill [phone])       Region: Abdominal Laterality: Left   Indications: 1. Chronic pain syndrome   2. Chronic low back pain  3. Chronic radicular lumbar pain (Left)   4. Encounter for adjustment or management of infusion pump   5. Presence of functional implant (Medtronic programmable intrathecal pump)   6. Long term current use of opiate analgesic     Type of Pump: Medtronic Synchromed II (MRI-compatible) Delivery Route: Intrathecal Type of Pain Treated: Neuropathic/Nociceptive Primary Medication Class: Opioid/opiate  Medication, Concentration, Infusion Program, & Delivery Rate: Please see scanned programming printout.   Coagulation Parameters Lab Results  Component Value Date   INR 1.1* 12/16/2010   LABPROT 12.1 12/16/2010   PLT 161 10/29/2015    Verification of the correct person, correct site (including marking of site), and correct procedure were performed and confirmed by the patient.  Consent: Secured. Under the influence of no sedatives a written informed consent was obtained, after having provided information on the risks and possible complications. To fulfill our ethical and legal obligations, as recommended by the American Medical Association's Code of Ethics, we have provided information to the patient about our clinical impression; the nature and purpose of the treatment or procedure; the risks, benefits, and possible complications of the  intervention; alternatives; the risk(s) and benefit(s) of the alternative treatment(s) or procedure(s); and the  risk(s) and benefit(s) of doing nothing. The patient was provided information about the risks and possible complications associated with the procedure. These include, but are not limited to, failure to achieve desired goals, infection, bleeding, organ or nerve damage, allergic reactions, paralysis, and death. In addition, the patient was informed that Medicine is not an exact science; therefore, there is also the possibility of unforeseen risks and possible complications that may result in a catastrophic outcome. The patient indicated having understood very clearly. We have given the patient no guarantees and we have made no promises. Enough time was given to the patient to ask questions, all of which were answered to the patient's satisfaction.  Consent Attestation: I, the ordering provider, attest that I have discussed with the patient the benefits, risks, side-effects, alternatives, likelihood of achieving goals, and potential problems during recovery for the procedure that I have provided informed consent.  Pre-Procedure Preparation: Safety Precautions: Allergies reviewed. Appropriate site, procedure, and patient were confirmed by following the Joint Commission's Universal Protocol (UP.01.01.01), in the form of a "Time Out". The patient was asked to confirm marked site and procedure, before commencing. The patient was asked about blood thinners, or active infections, both of which were denied. Patient was assessed for positional comfort and all pressure points were checked before starting procedure. Allergies: She is allergic to doxycycline hyclate; sulfa antibiotics; and sulfamethoxazole-trimethoprim.. Infection Control Precautions: Sterile technique used. Standard Universal Precautions were taken as recommended by the Department of [facility] for Disease Control and Prevention  (CDC). Standard pre-surgical skin prep was conducted. Respiratory hygiene and cough etiquette was practiced. Hand hygiene observed. Safe injection practices and needle disposal techniques followed. SDV (single dose vial) medications used. Medications properly checked for expiration dates and contaminants. Personal protective equipment (PPE) used: Sterile surgical gloves. Monitoring:  Clinical observation Filed Vitals:   10/29/15 0754  BP: 89/63  Pulse: 78  Temp: 98.5 F (36.9 C)  Resp: 18  Height: '5\' 2"'  (1.575 m)  Weight: 180 lb (81.647 kg)  SpO2: 96%  Calculated BMI: Body mass index is 32.91 kg/(m^2).  Description of Procedure Process:   Time-out: "Time-out" completed before starting procedure, as per protocol. Position: Supine Target Area: Central-port of intrathecal pump. Approach: Anterior, 90 degree angle approach. Area Prepped: Entire Area around the pump implant. Prepping solution: ChloraPrep (2% chlorhexidine gluconate and 70% isopropyl alcohol) Safety Precautions: Aspiration looking for blood return was conducted prior to all injections. At no point did we inject any substances, as a needle was being advanced. No attempts were made at seeking any paresthesias. Safe injection practices and needle disposal techniques used. Medications properly checked for expiration dates. SDV (single dose vial) medications used. Description of the Procedure: Protocol guidelines were followed. Two nurses trained to do implant refills were present during the entire procedure. The refill medication was checked by both healthcare providers as well as the patient. The patient was included in the "Time-out" to verify the medication. The patient was placed in position. The pump was identified. The area was prepped in the usual manner. The sterile template was positioned over the pump, making sure the side-port location matched that of the pump. Both, the pump and the template were held for stability. The  needle provided in the Medtronic Kit was then introduced thru the center of the template and into the central port. The pump content was aspirated and discarded volume documented.   Before injecting the medication we noticed that there were some bubbles coming back into the  syringe. At this point we decided to take the patient into the fluoroscopy suite and use fluoroscopy guidance to reposition the needle in an effort to make sure that the placement of the needle was 100% accurate. This was achieved through fluoroscopy. Further aspiration of the pump created a proper vacuum. Reconnection to the medications range produced the expected suctioning of the medication into the pump.  The new medication was slowly infused into the pump, thru the filter, making sure to avoid overpressure of the device. The needle was then removed and the area cleansed, making sure to leave some of the prepping solution back to take advantage of its long term bactericidal properties. The pump was interrogated and programmed to reflect the correct medication, volume, and dosage. The program was printed and taken to the physician for approval. Once checked and signed by the physician, a copy was provided to the patient and another scanned into the EMR. EBL: None Materials Used: Medtronic Refill Kit  Imaging Guidance:  Type of Imaging Technique: Fluoroscopy Guidance (Non-spinal) Indication(s): Assistance in needle guidance and placement for procedures requiring needle placement in or near specific anatomical locations not easily accessible without such assistance. Exposure Time: Please see chart for details. Contrast: None required. Interpretation: Adequate needle placement into the central port.  Antibiotic Prophylaxis:  Indication(s): No indications identified. Type:  Antibiotics Given (last 72 hours)    None       Post-operative Assessment:  Complications: No immediate post-treatment complications were  observed. Disposition: The patient tolerated the entire procedure well. No post-procedural neurological changes observed. The patient was discharged home, once institutional criteria were met. The patient was provided with post-procedure discharge instructions, including a section on how to identify potential problems. Should any problems arise concerning this procedure, the patient was given instructions to immediately contact us, at any time, without hesitation. Comments:  No additional relevant information.  Assessment & Plan  Primary Diagnosis & Pertinent Problem List: The primary encounter diagnosis was Generalized weakness. 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  Visit Diagnosis: 1. Generalized weakness   2. Chronic pain   3. Chronic pain syndrome   4. Chronic low back pain (Location of Primary Source of Pain)   5. Chronic radicular lumbar pain (Left)   6. Encounter for adjustment or management of infusion pump   7. Long term current use of opiate analgesic   8. Presence of functional implant (Medtronic programmable intrathecal pump)   9. Presence of intrathecal pump   10. Chronic kidney disease (CKD), stage II (mild)   11. Disturbance of skin sensation   12.  Breathlessness on exertion   13. 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[place])   15. Shortness of breath   16. Chronic lower extremity pain (Location of Primary Source of Pain) (Left)   17. Chronic sacroiliac joint pain (Location of  Tertiary source of pain) (Left)     Problems updated and reviewed during this visit: Problem  Generalized Weakness  Chronic Pain  Chronic lower extremity pain (Location of Primary Source of Pain) (Left)   Pain runs through the back of the leg down to the level of the knee, possibly referred pain pattern.   Chronic sacroiliac joint pain (Location of Tertiary source of pain) (Left)  Chronic lumbar radicular pain (L4 Dermatome) (Left)  Chronic low back pain (Location of Secondary source of pain) (Left)  Disturbance of Skin Sensation    Problem-specific Plan(s): No problem-specific assessment & plan notes found for this encounter.  No new assessment & plan notes have been filed under this hospital service since the last note was generated. Service: Pain Management   Plan of Care   Problem List Items Addressed This Visit      High   Chronic low back pain (Location of Secondary source of pain) (Left) (Chronic)   Relevant Orders   DG Lumbar Spine Complete W/Bend (Completed)   DG Si Joints (Completed)   Chronic lower extremity pain (Location of Primary Source of Pain) (Left) (Chronic)   Relevant Medications   lidocaine (PF) (XYLOCAINE) 1 % injection   Chronic lumbar radicular pain (L4 Dermatome) (Left) (Chronic)   Relevant Orders   DG Lumbar Spine Complete W/Bend (Completed)   Chronic pain (Chronic)   Relevant Medications   lidocaine (PF) (XYLOCAINE) 1 % injection   Other Relevant Orders   Comprehensive metabolic panel (Completed)   C-reactive protein (Completed)   Magnesium (Completed)   Sedimentation rate (Completed)   25-Hydroxyvitamin D Lcms D2+D3   CBC with  Differential/Platelet (Completed)   Chronic pain syndrome (Chronic)   Chronic sacroiliac joint pain (Location of Tertiary source of pain) (Left) (Chronic)   Generalized weakness - Primary   Relevant Orders   Comprehensive metabolic panel (Completed)   C-reactive protein (Completed)   Magnesium (Completed)   Sedimentation rate (Completed)   25-Hydroxyvitamin D Lcms D2+D3   CBC with Differential/Platelet (Completed)     Medium   Encounter for adjustment or management of infusion pump   Long term current use of opiate analgesic (Chronic)   Relevant Orders   ToxASSURE Select 13 (MW), Urine   Presence of functional implant (Medtronic programmable intrathecal pump) (Chronic)   Presence of intrathecal pump (Chronic)     Low   Disturbance of skin sensation (Chronic)   Relevant Orders   Vitamin B12 (Completed)     Unprioritized   Breathlessness on exertion   Relevant Orders   Brain natriuretic peptide (Completed)   CAD (coronary artery disease)   Relevant Orders   Brain natriuretic peptide (Completed)   Chronic kidney disease (CKD), stage II (mild)   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HCC)   Relevant Orders   Brain natriuretic peptide (Completed)   Shortness of breath   Relevant Orders   Brain natriuretic peptide (Completed)      Orders Placed This Encounter  Procedures  . DG Lumbar Spine Complete W/Bend    Standing Status: Future     Number of Occurrences: 1     Standing Expiration Date: 10/28/2016    Scheduling Instructions:     Please include flexion and extension views and report any spinal instability (>4 mm displacement of any spondylolisthesis). If present, please report any spondylolisthesis grade, as well as displacement in millimeters.    Order Specific Question:  Reason for Exam (SYMPTOM  OR DIAGNOSIS REQUIRED)    Answer:  Low  back pain    Order Specific Question:  Preferred imaging location?    Answer:  [facility]    Order Specific Question:  Call  Results- Best Contact Number?    Answer:  [phone] (Pain Clinic facility) (Dr. Dossie Arbour)  . DG Si Joints    Standing Status: Future     Number of Occurrences: 1     Standing Expiration Date: 10/28/2016    Order Specific Question:  Reason for Exam (SYMPTOM  OR DIAGNOSIS REQUIRED)    Answer:  Low back pain    Order Specific Question:  Preferred imaging location?    Answer:  [facility]    Order Specific Question:  Call Results- Best Contact Number?    Answer:  [phone] (Pain Clinic facility) (Dr. Dossie Arbour)  . ToxASSURE Select 13 (MW), Urine    Volume: 30 ml(s). Minimum 3 ml of urine is needed. Document temperature of fresh sample. Indications: Long term (current) use of opiate analgesic (Z79.891)  . Comprehensive metabolic panel    Standing Status: Future     Number of Occurrences: 1     Standing Expiration Date: 11/28/2015    Order Specific Question:  Has the patient fasted?    Answer:  No  . C-reactive protein    Standing Status: Future     Number of Occurrences: 1     Standing Expiration Date: 11/28/2015  . Magnesium    Standing Status: Future     Number of Occurrences: 1     Standing Expiration Date: 11/28/2015  . Sedimentation rate    Standing Status: Future     Number of Occurrences: 1     Standing Expiration Date: 11/28/2015  . Vitamin B12    Standing Status: Future     Number of Occurrences: 1     Standing Expiration Date: 11/28/2015  . 25-Hydroxyvitamin D Lcms D2+D3    Standing Status: Future     Number of Occurrences: 1     Standing Expiration Date: 11/28/2015  . Brain natriuretic peptide    Indications: Heart Failure, unspecified (I50.9)    Standing Status: Future     Number of Occurrences: 1     Standing Expiration Date: 11/28/2015  . CBC with Differential/Platelet    Standing Status: Future     Number of Occurrences: 1     Standing Expiration Date: 11/28/2015     Pharmacotherapy (Medications Ordered): Meds ordered this encounter  Medications   . lidocaine (PF) (XYLOCAINE) 1 % injection    Sig:     Donneta Romberg, Dena: cabinet override    Lab-work & Procedure Ordered: Orders Placed This Encounter  Procedures  . DG Lumbar Spine Complete W/Bend  . DG Si Joints  . ToxASSURE Select 13 (MW), Urine  . Comprehensive metabolic panel  . C-reactive protein  . Magnesium  . Sedimentation rate  . Vitamin B12  . 25-Hydroxyvitamin D Lcms D2+D3  . Brain natriuretic peptide  . CBC with Differential/Platelet    Imaging Ordered: None  Interventional Therapies: Scheduled:  Intrathecal pump refill.   Considering:  Diagnostic left caudal epidural steroid injection versus left L3-4 lumbar epidural steroid injection under fluoroscopic guidance, with or without sedation.    PRN Procedures:  None at this time.    Referral(s) or Consult(s): None at this time.  New Prescriptions   No medications on file    Medications administered during this visit: Tiffany Hunter had no medications administered during this visit.  Requested PM Follow-up: Return for Procedure (Scheduled),  Pump Refill: (Based on Program).  Future Appointments Date Time Provider Sophia  01/14/2016 11:00 AM Milinda Pointer, MD [facility] None    Primary Care Physician: Venetia Maxon, DO Location: [facility] Note by: Kathlen Brunswick. Dossie Arbour, M.D, DABA, DABAPM, DABPM, DABIPP, FIPP  Pain Score Disclaimer: We use the NRS-11 scale. This is a self-reported, subjective measurement of pain severity with only modest accuracy. It is used primarily to identify changes within a particular patient. It must be understood that outpatient pain scales are significantly less accurate that those used for research, where they can be applied under ideal controlled circumstances with minimal exposure to variables. In reality, the score is likely to be a combination of pain intensity and pain affect, where pain affect describes the degree of emotional  arousal or changes in action readiness caused by the sensory experience of pain. Factors such as social and work situation, setting, emotional state, anxiety levels, expectation, and prior pain experience may influence pain perception and show large inter-individual differences that may also be affected by time variables.  Patient instructions provided during this appointment: Patient Instructions  Narcotic Overdose A narcotic overdose is the misuse or overuse of a narcotic drug. A narcotic overdose can make you pass out and stop breathing. If you are not treated right away, this can cause permanent brain damage or stop your heart. Medicine may be given to reverse the effects of an overdose. If so, this medicine may bring on withdrawal symptoms. The symptoms may be abdominal cramps, throwing up (vomiting), sweating, chills, and nervousness. Injecting narcotics can cause more problems than just an overdose. AIDS, hepatitis, and other very serious infections are transmitted by sharing needles and syringes. If you decide to quit using, there are medicines which can help you through the withdrawal period. Trying to quit all at once on your own can be uncomfortable, but not life-threatening. Call your caregiver, Narcotics Anonymous, or any drug and alcohol treatment program for further help.    This information is not intended to replace advice given to you by your health care provider. Make sure you discuss any questions you have with your health care provider.   Document Released: 06/09/2004 Document Revised: 05/23/2014 Document Reviewed: 10/22/2014 Elsevier Interactive Patient Education 2016 [place] to go to medical mall to get labwork drawnFacet Blocks Patient Information  Description: The facets are joints in the spine between the vertebrae.  Like any joints in the body, facets can become irritated and painful.  Arthritis can also effect the facets.  By injecting steroids and local  anesthetic in and around these joints, we can temporarily block the nerve supply to them.  Steroids act directly on irritated nerves and tissues to reduce selling and inflammation which often leads to decreased pain.  Facet blocks may be done anywhere along the spine from the neck to the low back depending upon the location of your pain.   After numbing the skin with local anesthetic (like Novocaine), a small needle is passed onto the facet joints under x-ray guidance.  You may experience a sensation of pressure while this is being done.  The entire block usually lasts about 15-25 minutes.   Conditions which may be treated by facet blocks:   Low back/buttock pain  Neck/shoulder pain  Certain types of headaches  Preparation for the injection:  1. Do not eat any solid food or dairy products within 8 hours of your appointment. 2. You may drink clear liquid up to  3 hours before appointment.  Clear liquids include water, black coffee, juice or soda.  No milk or cream please. 3. You may take your regular medication, including pain medications, with a sip of water before your appointment.  Diabetics should hold regular insulin (if taken separately) and take 1/2 normal NPH dose the morning of the procedure.  Carry some sugar containing items with you to your appointment. 4. A driver must accompany you and be prepared to drive you home after your procedure. 5. Bring all your current medications with you. 6. An IV may be inserted and sedation may be given at the discretion of the physician. 7. A blood pressure cuff, EKG and other monitors will often be applied during the procedure.  Some patients may need to have extra oxygen administered for a short period. 8. You will be asked to provide medical information, including your allergies and medications, prior to the procedure.  We must know immediately if you are taking blood thinners (like Coumadin/Warfarin) or if you are allergic to IV iodine contrast  (dye).  We must know if you could possible be pregnant.  Possible side-effects:   Bleeding from needle site  Infection (rare, may require surgery)  Nerve injury (rare)  Numbness & tingling (temporary)  Difficulty urinating (rare, temporary)  Spinal headache (a headache worse with upright posture)  Light-headedness (temporary)  Pain at injection site (serveral days)  Decreased blood pressure (rare, temporary)  Weakness in arm/leg (temporary)  Pressure sensation in back/neck (temporary)   Call if you experience:   Fever/chills associated with headache or increased back/neck pain  Headache worsened by an upright position  New onset, weakness or numbness of an extremity below the injection site  Hives or difficulty breathing (go to the emergency room)  Inflammation or drainage at the injection site(s)  Severe back/neck pain greater than usual  New symptoms which are concerning to you  Please note:  Although the local anesthetic injected can often make your back or neck feel good for several hours after the injection, the pain will likely return. It takes 3-7 days for steroids to work.  You may not notice any pain relief for at least one week.  If effective, we will often do a series of 2-3 injections spaced 3-6 weeks apart to maximally decrease your pain.  After the initial series, you may be a candidate for a more permanent nerve block of the facets.  If you have any questions, please call #[phone]) [facility] Patient Information  Description: The epidural space surrounds the nerves as they exit the spinal cord.  In some patients, the nerves can be compressed and inflamed by a bulging disc or a tight spinal canal (spinal stenosis).  By injecting steroids into the epidural space, we can bring irritated nerves into direct contact with a potentially helpful medication.  These steroids act directly on the  irritated nerves and can reduce swelling and inflammation which often leads to decreased pain.  Epidural steroids may be injected anywhere along the spine and from the neck to the low back depending upon the location of your pain.   After numbing the skin with local anesthetic (like Novocaine), a small needle is passed into the epidural space slowly.  You may experience a sensation of pressure while this is being done.  The entire block usually last less than 10 minutes.  Conditions which may be treated by epidural steroids:   Low back and leg pain  Neck and arm pain  Spinal stenosis  Post-laminectomy syndrome  Herpes zoster (shingles) pain  Pain from compression fractures  Preparation for the injection:  1. Do not eat any solid food or dairy products within 8 hours of your appointment.  2. You may drink clear liquids up to 3 hours before appointment.  Clear liquids include water, black coffee, juice or soda.  No milk or cream please. 3. You may take your regular medication, including pain medications, with a sip of water before your appointment  Diabetics should hold regular insulin (if taken separately) and take 1/2 normal NPH dos the morning of the procedure.  Carry some sugar containing items with you to your appointment. 4. A driver must accompany you and be prepared to drive you home after your procedure.  5. Bring all your current medications with your. 6. An IV may be inserted and sedation may be given at the discretion of the physician.   7. A blood pressure cuff, EKG and other monitors will often be applied during the procedure.  Some patients may need to have extra oxygen administered for a short period. 8. You will be asked to provide medical information, including your allergies, prior to the procedure.  We must know immediately if you are taking blood thinners (like Coumadin/Warfarin)  Or if you are allergic to IV iodine contrast (dye). We must know if you could possible be  pregnant.  Possible side-effects:  Bleeding from needle site  Infection (rare, may require surgery)  Nerve injury (rare)  Numbness & tingling (temporary)  Difficulty urinating (rare, temporary)  Spinal headache ( a headache worse with upright posture)  Light -headedness (temporary)  Pain at injection site (several days)  Decreased blood pressure (temporary)  Weakness in arm/leg (temporary)  Pressure sensation in back/neck (temporary)  Call if you experience:  Fever/chills associated with headache or increased back/neck pain.  Headache worsened by an upright position.  New onset weakness or numbness of an extremity below the injection site  Hives or difficulty breathing (go to the emergency room)  Inflammation or drainage at the infection site  Severe back/neck pain  Any new symptoms which are concerning to you  Please note:  Although the local anesthetic injected can often make your back or neck feel good for several hours after the injection, the pain will likely return.  It takes 3-7 days for steroids to work in the epidural space.  You may not notice any pain relief for at least that one week.  If effective, we will often do a series of three injections spaced 3-6 weeks apart to maximally decrease your pain.  After the initial series, we generally will wait several months before considering a repeat injection of the same type.  If you have any questions, please call [phone] [place] FOR 8 HOURS BRING A DRIVER TAKE BLOOD PRESSURE MEDICATION THE MORNING OF PROCEDURE IF APPLICABLE

## 2015-10-29 NOTE — Progress Notes (Signed)
Safety precautions to be maintained throughout the outpatient stay will include: orient to surroundings, keep bed in low position, maintain call bell within reach at all times, provide assistance with transfer out of bed and ambulation.  

## 2015-10-29 NOTE — Progress Notes (Signed)
Dr Prentice Docker handles her heart/lung issues

## 2015-10-29 NOTE — Patient Instructions (Addendum)
Narcotic Overdose A narcotic overdose is the misuse or overuse of a narcotic drug. A narcotic overdose can make you pass out and stop breathing. If you are not treated right away, this can cause permanent brain damage or stop your heart. Medicine may be given to reverse the effects of an overdose. If so, this medicine may bring on withdrawal symptoms. The symptoms may be abdominal cramps, throwing up (vomiting), sweating, chills, and nervousness. Injecting narcotics can cause more problems than just an overdose. AIDS, hepatitis, and other very serious infections are transmitted by sharing needles and syringes. If you decide to quit using, there are medicines which can help you through the withdrawal period. Trying to quit all at once on your own can be uncomfortable, but not life-threatening. Call your caregiver, Narcotics Anonymous, or any drug and alcohol treatment program for further help.    This information is not intended to replace advice given to you by your health care provider. Make sure you discuss any questions you have with your health care provider.   Document Released: 06/09/2004 Document Revised: 05/23/2014 Document Reviewed: 10/22/2014 Elsevier Interactive Patient Education 2016 [place] to go to medical mall to get labwork drawnFacet Blocks Patient Information  Description: The facets are joints in the spine between the vertebrae.  Like any joints in the body, facets can become irritated and painful.  Arthritis can also effect the facets.  By injecting steroids and local anesthetic in and around these joints, we can temporarily block the nerve supply to them.  Steroids act directly on irritated nerves and tissues to reduce selling and inflammation which often leads to decreased pain.  Facet blocks may be done anywhere along the spine from the neck to the low back depending upon the location of your pain.   After numbing the skin with local anesthetic (like Novocaine),  a small needle is passed onto the facet joints under x-ray guidance.  You may experience a sensation of pressure while this is being done.  The entire block usually lasts about 15-25 minutes.   Conditions which may be treated by facet blocks:   Low back/buttock pain  Neck/shoulder pain  Certain types of headaches  Preparation for the injection:  1. Do not eat any solid food or dairy products within 8 hours of your appointment. 2. You may drink clear liquid up to 3 hours before appointment.  Clear liquids include water, black coffee, juice or soda.  No milk or cream please. 3. You may take your regular medication, including pain medications, with a sip of water before your appointment.  Diabetics should hold regular insulin (if taken separately) and take 1/2 normal NPH dose the morning of the procedure.  Carry some sugar containing items with you to your appointment. 4. A driver must accompany you and be prepared to drive you home after your procedure. 5. Bring all your current medications with you. 6. An IV may be inserted and sedation may be given at the discretion of the physician. 7. A blood pressure cuff, EKG and other monitors will often be applied during the procedure.  Some patients may need to have extra oxygen administered for a short period. 8. You will be asked to provide medical information, including your allergies and medications, prior to the procedure.  We must know immediately if you are taking blood thinners (like Coumadin/Warfarin) or if you are allergic to IV iodine contrast (dye).  We must know if you could possible be pregnant.  Possible side-effects:  Bleeding from needle site  Infection (rare, may require surgery)  Nerve injury (rare)  Numbness & tingling (temporary)  Difficulty urinating (rare, temporary)  Spinal headache (a headache worse with upright posture)  Light-headedness (temporary)  Pain at injection site (serveral days)  Decreased blood  pressure (rare, temporary)  Weakness in arm/leg (temporary)  Pressure sensation in back/neck (temporary)   Call if you experience:   Fever/chills associated with headache or increased back/neck pain  Headache worsened by an upright position  New onset, weakness or numbness of an extremity below the injection site  Hives or difficulty breathing (go to the emergency room)  Inflammation or drainage at the injection site(s)  Severe back/neck pain greater than usual  New symptoms which are concerning to you  Please note:  Although the local anesthetic injected can often make your back or neck feel good for several hours after the injection, the pain will likely return. It takes 3-7 days for steroids to work.  You may not notice any pain relief for at least one week.  If effective, we will often do a series of 2-3 injections spaced 3-6 weeks apart to maximally decrease your pain.  After the initial series, you may be a candidate for a more permanent nerve block of the facets.  If you have any questions, please call #[phone]) [facility] ClinicE[facility] Patient Information  Description: The epidural space surrounds the nerves as they exit the spinal cord.  In some patients, the nerves can be compressed and inflamed by a bulging disc or a tight spinal canal (spinal stenosis).  By injecting steroids into the epidural space, we can bring irritated nerves into direct contact with a potentially helpful medication.  These steroids act directly on the irritated nerves and can reduce swelling and inflammation which often leads to decreased pain.  Epidural steroids may be injected anywhere along the spine and from the neck to the low back depending upon the location of your pain.   After numbing the skin with local anesthetic (like Novocaine), a small needle is passed into the epidural space slowly.  You may experience a sensation of pressure while  this is being done.  The entire block usually last less than 10 minutes.  Conditions which may be treated by epidural steroids:   Low back and leg pain  Neck and arm pain  Spinal stenosis  Post-laminectomy syndrome  Herpes zoster (shingles) pain  Pain from compression fractures  Preparation for the injection:  1. Do not eat any solid food or dairy products within 8 hours of your appointment.  2. You may drink clear liquids up to 3 hours before appointment.  Clear liquids include water, black coffee, juice or soda.  No milk or cream please. 3. You may take your regular medication, including pain medications, with a sip of water before your appointment  Diabetics should hold regular insulin (if taken separately) and take 1/2 normal NPH dos the morning of the procedure.  Carry some sugar containing items with you to your appointment. 4. A driver must accompany you and be prepared to drive you home after your procedure.  5. Bring all your current medications with your. 6. An IV may be inserted and sedation may be given at the discretion of the physician.   7. A blood pressure cuff, EKG and other monitors will often be applied during the procedure.  Some patients may need to have extra oxygen administered for a short period. 8.  You will be asked to provide medical information, including your allergies, prior to the procedure.  We must know immediately if you are taking blood thinners (like Coumadin/Warfarin)  Or if you are allergic to IV iodine contrast (dye). We must know if you could possible be pregnant.  Possible side-effects:  Bleeding from needle site  Infection (rare, may require surgery)  Nerve injury (rare)  Numbness & tingling (temporary)  Difficulty urinating (rare, temporary)  Spinal headache ( a headache worse with upright posture)  Light -headedness (temporary)  Pain at injection site (several days)  Decreased blood pressure (temporary)  Weakness in arm/leg  (temporary)  Pressure sensation in back/neck (temporary)  Call if you experience:  Fever/chills associated with headache or increased back/neck pain.  Headache worsened by an upright position.  New onset weakness or numbness of an extremity below the injection site  Hives or difficulty breathing (go to the emergency room)  Inflammation or drainage at the infection site  Severe back/neck pain  Any new symptoms which are concerning to you  Please note:  Although the local anesthetic injected can often make your back or neck feel good for several hours after the injection, the pain will likely return.  It takes 3-7 days for steroids to work in the epidural space.  You may not notice any pain relief for at least that one week.  If effective, we will often do a series of three injections spaced 3-6 weeks apart to maximally decrease your pain.  After the initial series, we generally will wait several months before considering a repeat injection of the same type.  If you have any questions, please call [phone] [place] FOR 8 HOURS BRING A DRIVER TAKE BLOOD PRESSURE MEDICATION THE MORNING OF PROCEDURE IF APPLICABLE

## 2015-10-30 ENCOUNTER — Telehealth: Payer: Self-pay

## 2015-10-30 NOTE — Telephone Encounter (Signed)
Post pump refill call.  Left message.  

## 2015-11-01 LAB — 25-HYDROXY VITAMIN D LCMS D2+D3: 25-Hydroxy, Vitamin D: 32 ng/mL

## 2015-11-01 LAB — 25-HYDROXYVITAMIN D LCMS D2+D3
25-HYDROXY, VITAMIN D-2: 18 ng/mL
25-HYDROXY, VITAMIN D-3: 14 ng/mL

## 2015-11-04 MED FILL — Medication: Qty: 1 | Status: AC

## 2015-11-09 LAB — TOXASSURE SELECT 13 (MW), URINE: PDF: 0

## 2015-11-24 DIAGNOSIS — R609 Edema, unspecified: Secondary | ICD-10-CM | POA: Insufficient documentation

## 2015-12-04 DIAGNOSIS — R920 Mammographic microcalcification found on diagnostic imaging of breast: Secondary | ICD-10-CM | POA: Insufficient documentation

## 2015-12-22 ENCOUNTER — Other Ambulatory Visit: Payer: Self-pay

## 2015-12-22 MED ORDER — PAIN MANAGEMENT IT PUMP REFILL: 1.0000 | Freq: Once | INTRATHECAL | 0 refills | Status: DC

## 2016-01-14 ENCOUNTER — Ambulatory Visit: Payer: Medicare Other | Attending: Pain Medicine | Admitting: Pain Medicine

## 2016-01-14 ENCOUNTER — Encounter: Payer: Self-pay | Admitting: Pain Medicine

## 2016-01-14 VITALS — BP 137/99 | HR 77 | Temp 98.4°F | Resp 16 | Ht 62.0 in | Wt 190.0 lb

## 2016-01-14 DIAGNOSIS — I129 Hypertensive chronic kidney disease with stage 1 through stage 4 chronic kidney disease, or unspecified chronic kidney disease: Secondary | ICD-10-CM | POA: Diagnosis not present

## 2016-01-14 DIAGNOSIS — N182 Chronic kidney disease, stage 2 (mild): Secondary | ICD-10-CM | POA: Diagnosis not present

## 2016-01-14 DIAGNOSIS — M1712 Unilateral primary osteoarthritis, left knee: Secondary | ICD-10-CM | POA: Diagnosis not present

## 2016-01-14 DIAGNOSIS — R0602 Shortness of breath: Secondary | ICD-10-CM | POA: Diagnosis not present

## 2016-01-14 DIAGNOSIS — M7710 Lateral epicondylitis, unspecified elbow: Secondary | ICD-10-CM | POA: Insufficient documentation

## 2016-01-14 DIAGNOSIS — M25562 Pain in left knee: Secondary | ICD-10-CM | POA: Insufficient documentation

## 2016-01-14 DIAGNOSIS — R921 Mammographic calcification found on diagnostic imaging of breast: Secondary | ICD-10-CM | POA: Insufficient documentation

## 2016-01-14 DIAGNOSIS — E782 Mixed hyperlipidemia: Secondary | ICD-10-CM | POA: Insufficient documentation

## 2016-01-14 DIAGNOSIS — Z9689 Presence of other specified functional implants: Secondary | ICD-10-CM | POA: Diagnosis not present

## 2016-01-14 DIAGNOSIS — M5416 Radiculopathy, lumbar region: Secondary | ICD-10-CM

## 2016-01-14 DIAGNOSIS — R200 Anesthesia of skin: Secondary | ICD-10-CM | POA: Insufficient documentation

## 2016-01-14 DIAGNOSIS — R300 Dysuria: Secondary | ICD-10-CM | POA: Diagnosis not present

## 2016-01-14 DIAGNOSIS — E1122 Type 2 diabetes mellitus with diabetic chronic kidney disease: Secondary | ICD-10-CM | POA: Insufficient documentation

## 2016-01-14 DIAGNOSIS — Z969 Presence of functional implant, unspecified: Secondary | ICD-10-CM

## 2016-01-14 DIAGNOSIS — R531 Weakness: Secondary | ICD-10-CM | POA: Diagnosis not present

## 2016-01-14 DIAGNOSIS — G894 Chronic pain syndrome: Secondary | ICD-10-CM

## 2016-01-14 DIAGNOSIS — Z451 Encounter for adjustment and management of infusion pump: Secondary | ICD-10-CM

## 2016-01-14 DIAGNOSIS — M79605 Pain in left leg: Secondary | ICD-10-CM | POA: Insufficient documentation

## 2016-01-14 DIAGNOSIS — G8929 Other chronic pain: Secondary | ICD-10-CM | POA: Diagnosis not present

## 2016-01-14 DIAGNOSIS — M533 Sacrococcygeal disorders, not elsewhere classified: Secondary | ICD-10-CM | POA: Diagnosis not present

## 2016-01-14 DIAGNOSIS — Z978 Presence of other specified devices: Secondary | ICD-10-CM

## 2016-01-14 DIAGNOSIS — F1190 Opioid use, unspecified, uncomplicated: Secondary | ICD-10-CM

## 2016-01-14 DIAGNOSIS — M25552 Pain in left hip: Secondary | ICD-10-CM | POA: Insufficient documentation

## 2016-01-14 DIAGNOSIS — M545 Low back pain: Secondary | ICD-10-CM | POA: Diagnosis present

## 2016-01-14 DIAGNOSIS — M4726 Other spondylosis with radiculopathy, lumbar region: Secondary | ICD-10-CM | POA: Diagnosis not present

## 2016-01-14 DIAGNOSIS — Z79891 Long term (current) use of opiate analgesic: Secondary | ICD-10-CM | POA: Insufficient documentation

## 2016-01-14 DIAGNOSIS — I2510 Atherosclerotic heart disease of native coronary artery without angina pectoris: Secondary | ICD-10-CM | POA: Insufficient documentation

## 2016-01-14 NOTE — Patient Instructions (Signed)
Narcotic Overdose °A narcotic overdose is the misuse or overuse of a narcotic drug. A narcotic overdose can make you pass out and stop breathing. If you are not treated right away, this can cause permanent brain damage or stop your heart. Medicine may be given to reverse the effects of an overdose. If so, this medicine may bring on withdrawal symptoms. The symptoms may be abdominal cramps, throwing up (vomiting), sweating, chills, and nervousness. °Injecting narcotics can cause more problems than just an overdose. AIDS, hepatitis, and other very serious infections are transmitted by sharing needles and syringes. If you decide to quit using, there are medicines which can help you through the withdrawal period. Trying to quit all at once on your own can be uncomfortable, but not life-threatening. Call your caregiver, Narcotics Anonymous, or any drug and alcohol treatment program for further help.  °  °This information is not intended to replace advice given to you by your health care provider. Make sure you discuss any questions you have with your health care provider. °  °Document Released: 06/09/2004 Document Revised: 05/23/2014 Document Reviewed: 10/22/2014 °Elsevier Interactive Patient Education ©2016 Elsevier Inc. ° °

## 2016-01-14 NOTE — Progress Notes (Signed)
Safety precautions to be maintained throughout the outpatient stay will include: orient to surroundings, keep bed in low position, maintain call bell within reach at all times, provide assistance with transfer out of bed and ambulation.  

## 2016-01-14 NOTE — Progress Notes (Signed)
Patient's Name: Tiffany Hunter  MRN: [id number]  Referring Provider: Venetia Maxon, DO  DOB: [birth year]  PCP: Venetia Maxon, DO  DOS: 01/14/2016  Note by: Kathlen Brunswick. Dossie Arbour, MD  Service setting: Ambulatory outpatient  Location: [facility] (AMB) Pain Management Facility  Visit type: Procedure  Specialty: Interventional Pain Management  Patient type: Established   Primary Reason for Visit: Intrathecal Pump Management CC: Back Pain (lower)   Procedure:  Intrathecal Drug Delivery System (IDDS):  Type: [place]erv[place] Refill [phone])       Region: Abdominal Laterality: Left  Type of Pump: Medtronic Synchromed II (MRI-compatible) Delivery Route: Intrathecal Type of Pain Treated: Neuropathic/Nociceptive Primary Medication Class: Opioid/opiate  Medication, Concentration, Infusion Program, & Delivery Rate: Please see scanned programming printout.   Indications: 1. Chronic pain syndrome   2. Chronic lower extremity pain (Location of Primary Source of Pain) (Left)   3. Chronic low back pain (Location of Secondary source of pain) (Left)   4. Chronic lumbar radicular pain (L4 Dermatome) (Left)   5. Encounter for adjustment or management of infusion pump   6. Opiate use (586.8 MME/Day)   7. 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 Presence of intrathecal pump     Pre-procedure Pain Score: 9/10 Post-procedure Pain Score: 9 /10   Pre-Procedure Assessment:  Tiffany Hunter is a 70 y.o. year old, female patient, seen today for interventional treatment. 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); Atypical chest pain;  Arteriosclerosis of coronary artery; Carotid artery bruit; Chronic kidney disease (CKD), stage II (mild); Diabetes mellitus ([place]); Breathlessness on exertion; Difficult or painful urination; Benign hypertension; Combined fat and carbohydrate induced hyperlipemia; 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[place]); Chronic low back pain (Location of Secondary source of pain) (Left); Backhand tennis elbow; Menopausal symptom; Osteoarthritis; Lumbar spondylosis; Failed back surgical syndrome; Chronic lumbar radicular pain (L4 Dermatome) (Left); Coccygodynia; Chronic hip pain (Left); Osteoarthritis of hip (Left); Chronic knee pain (Left); Osteoarthritis of knee (Left); 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 Her primarily concern today is the Back Pain (lower)   Pain Type: Chronic pain Pain Location: Back Pain Orientation: Lower Pain Descriptors / Indicators: Aching, Constant Pain Frequency: Constant  Date of Last Visit: 10/29/15 Service Provided on Last Visit: Med Refill (pump refill)  Coagulation Parameters Lab Results  Component Value Date   INR 1.1 (H) 12/16/2010   LABPROT 12.1 12/16/2010   PLT 161 10/29/2015    Verification of the correct person, correct site (including marking of site), and correct procedure were performed and confirmed by the patient.  Consent: Secured. Under the influence of no sedatives a written informed consent was obtained, after having provided information on the risks and possible complications. To fulfill our ethical and legal obligations, as recommended by the American Medical Association's Code of Ethics, we have provided  information to the patient about our clinical impression; the nature and purpose of the treatment or procedure; the risks, benefits, and possible complications of the intervention;  alternatives; the risk(s) and benefit(s) of the alternative treatment(s) or procedure(s); and the risk(s) and benefit(s) of doing nothing. The patient was provided information about the risks and possible complications associated with the procedure. These include, but are not limited to, failure to achieve desired goals, infection, bleeding, organ or nerve damage, allergic reactions, paralysis, and death. In addition, the patient was informed that Medicine is not an exact science; therefore, there is also the possibility of unforeseen risks and possible complications that may result in a catastrophic outcome. The patient indicated having understood very clearly. We have given the patient no guarantees and we have made no promises. Enough time was given to the patient to ask questions, all of which were answered to the patient's satisfaction.  Consent Attestation: I, the ordering provider, attest that I have discussed with the patient the benefits, risks, side-effects, alternatives, likelihood of achieving goals, and potential problems during recovery for the procedure that I have provided informed consent.  Pre-Procedure Preparation: Safety Precautions: Allergies reviewed. Appropriate site, procedure, and patient were confirmed by following the Joint Commission's Universal Protocol (UP.01.01.01), in the form of a "Time Out". The patient was asked to confirm marked site and procedure, before commencing. The patient was asked about blood thinners, or active infections, both of which were denied. Patient was assessed for positional comfort and all pressure points were checked before starting procedure. Infection Control Precautions: Sterile technique used. Standard Universal Precautions were taken as recommended by the Department of  [facility] for Disease Control and Prevention (CDC). Standard pre-surgical skin prep was conducted. Respiratory hygiene and cough etiquette was practiced. Hand hygiene observed. Safe injection practices and needle disposal techniques followed. SDV (single dose vial) medications used. Medications properly checked for expiration dates and contaminants. Personal protective equipment (PPE) used: Surgical mask. Sterile Radiation-resistant gloves. Monitoring:  As per clinic protocol. Vitals:   01/14/16 1148  BP: (!) 137/99  Pulse: 77  Resp: 16  Temp: 98.4 F (36.9 C)  TempSrc: Oral  SpO2: 95%  Weight: 190 lb (86.2 kg)  Height: '5\' 2"'  (1.575 m)  Calculated BMI: Body mass index is 34.75 kg/m. Allergies: She is allergic to doxycycline hyclate; sulfa antibiotics; and sulfamethoxazole-trimethoprim.. Allergy Precautions: None required  Description of Procedure Process:   Time-out: "Time-out" completed before starting procedure, as per protocol. Position: Supine Target Area: Central-port of intrathecal pump. Approach: Anterior, 90 degree angle approach. Area Prepped: Entire Area around the pump implant. Prepping solution: ChloraPrep (2% chlorhexidine gluconate and 70% isopropyl alcohol) Safety Precautions: Aspiration looking for blood return was conducted prior to all injections. At no point did we inject any substances, as a needle was being advanced. No attempts were made at seeking any paresthesias. Safe injection practices and needle disposal techniques used. Medications properly checked for expiration dates. SDV (single dose vial) medications used. Description of the Procedure: Protocol guidelines were followed. Two nurses trained to do implant refills were present during the entire procedure. The refill medication was checked by both healthcare providers as well as the patient. The patient was included in the "Time-out" to verify the medication. The patient was placed in position. The  pump was identified. The area was prepped in the usual manner. The sterile template was positioned over the pump, making sure the side-port location matched that of the pump. Both, the pump and the template were held for stability. The needle provided in the Medtronic Kit was then introduced thru the center of the template and into the central port. The pump  content was aspirated and discarded volume documented. The new medication was slowly infused into the pump, thru the filter, making sure to avoid overpressure of the device. The needle was then removed and the area cleansed, making sure to leave some of the prepping solution back to take advantage of its long term bactericidal properties. The pump was interrogated and programmed to reflect the correct medication, volume, and dosage. The program was printed and taken to the physician for approval. Once checked and signed by the physician, a copy was provided to the patient and another scanned into the EMR. EBL: None Materials Used: Medtronic Refill Kit  Imaging Guidance:  Type of Imaging Technique: None required.  Antibiotic Prophylaxis:  Indication(s): No indications identified. Type:  Antibiotics Given (last 72 hours)    None       Post-operative Assessment:  Complications: No immediate post-treatment complications were observed. Disposition: The patient tolerated the entire procedure well. No post-procedural neurological changes observed. The patient was discharged home, once institutional criteria were met. The patient was provided with post-procedure discharge instructions, including a section on how to identify potential problems. Should any problems arise concerning this procedure, the patient was given instructions to immediately contact us, at any time, without hesitation. Comments:  No additional relevant information.  No orders of the defined types were placed in this encounter.   Medications administered during this visit: Ms.  Hunter had no medications administered during this visit.  Prescriptions ordered during this visit: No orders of the defined types were placed in this encounter.   Requested PM Follow-up: No Follow-up on file.  Future Appointments Date Time Provider [place]  03/22/2016 11:00 AM Milinda Pointer, MD [facility] None    Primary Care Physician: Venetia Maxon, DO Location: [facility] Note by: Kathlen Brunswick. Dossie Arbour, M.D, DABA, DABAPM, DABPM, DABIPP, FIPP  Disclaimer:  Medicine is not an exact science. The only guarantee in medicine is that nothing is guaranteed. It is important to note that the decision to proceed with this intervention was based on the information collected from the patient. The Data and conclusions were drawn from the patient's questionnaire, the interview, and the physical examination. Because the information was provided in large part by the patient, it cannot be guaranteed that it has not been purposely or unconsciously manipulated. Every effort has been made to obtain as much relevant data as possible for this evaluation. It is important to note that the conclusions that lead to this procedure are derived in large part from the available data. Always take into account that the treatment will also be dependent on availability of resources and existing treatment guidelines, considered by other Pain Management Practitioners as being common knowledge and practice, at the time of the intervention. For Medico-Legal purposes, it is also important to point out that variation in procedural techniques and pharmacological choices are the acceptable norm. The indications, contraindications, technique, and results of the above procedure should only be interpreted and judged by a Board-Certified Interventional Pain Specialist with extensive familiarity and expertise in the same exact procedure and technique. Attempts at providing opinions without  similar or greater experience and expertise than that of the treating physician will be considered as inappropriate and unethical, and shall result in a formal complaint to the state medical board and applicable specialty societies. Pain Score: We use the NRS-11 scale. This is a self-reported, subjective measurement of pain severity with only modest accuracy. It is used primarily to identify changes within a particular  patient. It must be understood that outpatient pain scales are significantly less accurate that those used for research, where they can be applied under ideal controlled circumstances with minimal exposure to variables. In reality, the score is likely to be a combination of pain intensity and pain affect, where pain affect describes the degree of emotional arousal or changes in action readiness caused by the sensory experience of pain. Factors such as social and work situation, setting, emotional state, anxiety levels, expectation, and prior pain experience may influence pain perception and show large inter-individual differences that may also be affected by time variables.

## 2016-01-15 ENCOUNTER — Telehealth: Payer: Self-pay

## 2016-01-15 NOTE — Telephone Encounter (Signed)
Called pt and man answered - denies any needs he states that she is doing fine. Instructed to call with any problems or go to ER if needed.

## 2016-01-29 MED FILL — Medication: INTRATHECAL | Qty: 1 | Status: AC

## 2016-03-03 ENCOUNTER — Other Ambulatory Visit: Payer: Self-pay

## 2016-03-03 DIAGNOSIS — G894 Chronic pain syndrome: Secondary | ICD-10-CM

## 2016-03-03 MED ORDER — PAIN MANAGEMENT IT PUMP REFILL: 1.0000 | Freq: Once | INTRATHECAL | 0 refills | Status: DC

## 2016-03-17 ENCOUNTER — Other Ambulatory Visit: Payer: Self-pay

## 2016-03-22 ENCOUNTER — Encounter: Payer: Medicare Other | Admitting: Pain Medicine

## 2016-03-22 ENCOUNTER — Ambulatory Visit: Payer: Medicare Other | Attending: Pain Medicine | Admitting: Pain Medicine

## 2016-03-22 ENCOUNTER — Encounter: Payer: Self-pay | Admitting: Pain Medicine

## 2016-03-22 VITALS — BP 144/65 | HR 79 | Temp 98.4°F | Resp 16 | Ht 62.5 in | Wt 185.0 lb

## 2016-03-22 DIAGNOSIS — Z9689 Presence of other specified functional implants: Secondary | ICD-10-CM | POA: Diagnosis not present

## 2016-03-22 DIAGNOSIS — M545 Low back pain: Secondary | ICD-10-CM | POA: Insufficient documentation

## 2016-03-22 DIAGNOSIS — G894 Chronic pain syndrome: Secondary | ICD-10-CM | POA: Diagnosis present

## 2016-03-22 DIAGNOSIS — Z978 Presence of other specified devices: Secondary | ICD-10-CM

## 2016-03-22 DIAGNOSIS — Z79891 Long term (current) use of opiate analgesic: Secondary | ICD-10-CM | POA: Insufficient documentation

## 2016-03-22 DIAGNOSIS — F1190 Opioid use, unspecified, uncomplicated: Secondary | ICD-10-CM

## 2016-03-22 NOTE — Progress Notes (Signed)
Patient's Name: Tiffany Hunter  MRN: [id number]  Referring Provider: Venetia Maxon, DO  DOB: 1945/09/16  PCP: Venetia Maxon, DO  DOS: 03/22/2016  Note by: Kathlen Brunswick. Dossie Arbour, MD  Service setting: Ambulatory outpatient  Location: [facility] (AMB) Pain Management Facility  Visit type: Procedure  Specialty: Interventional Pain Management  Patient type: Established   Primary Reason for Visit: Interventional Pain Management Treatment. CC: Back Pain (lower )  Procedure:  Intrathecal Drug Delivery System (IDDS):  Type: Reservoir Refill [phone]) No rate change Region: Abdominal Laterality: Left  Type of Pump: Medtronic Synchromed II (MRI-compatible) Delivery Route: Intrathecal Type of Pain Treated: Neuropathic/Nociceptive Primary Medication Class: Opioid/opiate  Medication, Concentration, Infusion Program, & Delivery Rate: Please see scanned programming printout.  Indications: 1. Chronic pain syndrome   2. Long term current use of opiate analgesic   3. 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 Opiate use (586.8 MME/Day)    Pain Assessment: Self-Reported Pain Score: 5 /10 Clinically the patient looks like a 1/10 Reported level is inconsistent with clinical observations. Information on the proper use of the pain score provided to the patient today.   Pre-Procedure Assessment:  [name] is a 70 y.o. (year old), female patient, seen today for interventional treatment. She  has a past surgical history that includes Carpal tunnel release; Back surgery; Breast surgery (Bilateral); Cholecystectomy; Colonoscopy w/ polypectomy; Cataract extraction w/ intraocular lens implant (Right); Fracture surgery (Right); and Abdominal hysterectomy.. Her primarily concern today is the Back Pain (lower ) The primary encounter diagnosis was Chronic pain syndrome. Diagnoses of Long term current use of opiate analgesic, Presence of intrathecal pump (Medtronic programmable  intrathecal pump), and Opiate use (586.8 MME/Day) were also pertinent to this visit.  Pain Type: Chronic pain Pain Location: Back Pain Orientation: Lower Pain Descriptors / Indicators: Sharp, Constant Pain Frequency: Constant  Verification of the correct person, correct site (including marking of site), and correct procedure were performed and confirmed by the patient.  Consent: Before the procedure and under the influence of no sedative(s), amnesic(s), or anxiolytics, the patient was informed of the treatment options, risks and possible complications. To fulfill our ethical and legal obligations, as recommended by the American Medical Association's Code of Ethics, I have informed the patient of my clinical impression; the nature and purpose of the treatment or procedure; the risks, benefits, and possible complications of the intervention; the alternatives, including doing nothing; the risk(s) and benefit(s) of the alternative treatment(s) or procedure(s); and the risk(s) and benefit(s) of doing nothing.  [name] was provided with information about the general risks and possible complications associated with most interventional procedures. These include, but are not limited to: failure to achieve desired goals, infection, bleeding, organ or nerve damage, allergic reactions, paralysis, and/or death.  In addition, she was informed of those risks and possible complications associated to this particular procedure, which include, but are not limited to: damage to the implant; failure to decrease pain; local, systemic, or serious CNS infections, intraspinal abscess with possible cord compression and paralysis, or life-threatening such as meningitis; bleeding; organ damage; nerve injury or damage with subsequent sensory, motor, and/or autonomic system dysfunction, resulting in transient or permanent pain, numbness, and/or weakness of one or several areas of the body; allergic reactions, either minor or  major life-threatening, such as anaphylactic or anaphylactoid reactions.  Furthermore, [name] was informed of those risks and complications associated with the medications. These include, but are not limited to: allergic reactions (i.e.: anaphylactic or  anaphylactoid reactions); endorphine suppression; bradycardia and/or hypotension; water retention and/or peripheral vascular relaxation leading to lower extremity edema and possible stasis ulcers; respiratory depression and/or shortness of breath; decreased metabolic rate leading to weight gain; swelling or edema; medication-induced neural toxicity; particulate matter embolism and blood vessel occlusion with resultant organ, and/or nervous system infarction; and/or intrathecal granuloma formation with possible spinal cord compression and permanent paralysis.  Before refilling the pump [name] was informed that some of the medications used in the devise may not be FDA approved for such use and therefore it constitutes an off-label use of the medications.  Finally, she was informed that Medicine is not an exact science; therefore, there is also the possibility of unforeseen or unpredictable risks and/or possible complications that may result in a catastrophic outcome. The patient indicated having understood very clearly. We have given the patient no guarantees and we have made no promises. Enough time was given to the patient to ask questions, all of which were answered to the patient's satisfaction. [name] has indicated that she wanted to continue with the procedure.  Consent Attestation: I, the ordering provider, attest that I have discussed with the patient the benefits, risks, side-effects, alternatives, likelihood of achieving goals, and potential problems during recovery for the procedure that I have provided informed consent.  Pre-Procedure Preparation:  Safety Precautions: Allergies reviewed. The patient was asked about blood  thinners, or active infections, both of which were denied. The patient was asked to confirm the procedure and laterality, before marking the site, and again before commencing the procedure. Appropriate site, procedure, and patient were confirmed by following the Joint Commission's Universal Protocol (UP.01.01.01), in the form of a "Time Out". The patient was asked to participate by confirming the accuracy of the "Time Out" information. Patient was assessed for positional comfort and pressure points before starting the procedure. Allergies: She is allergic to doxycycline hyclate; sulfa antibiotics; and sulfamethoxazole-trimethoprim. Allergy Precautions: None required Infection Control Precautions: Sterile technique used. Standard Universal Precautions were taken as recommended by the Department of [facility] for Disease Control and Prevention (CDC). Standard pre-surgical skin prep was conducted. Respiratory hygiene and cough etiquette was practiced. Hand hygiene observed. Safe injection practices and needle disposal techniques followed. SDV (single dose vial) medications used. Medications properly checked for expiration dates and contaminants. Personal protective equipment (PPE) used as per protocol. Monitoring:  As per clinic protocol. Vitals:   03/22/16 1046  BP: (!) 144/65  Pulse: 79  Resp: 16  Temp: 98.4 F (36.9 C)  TempSrc: Oral  SpO2: 98%  Weight: 185 lb (83.9 kg)  Height: 5' 2.5" (1.588 m)  Calculated BMI: Body mass index is 33.3 kg/m. Time-out: "Time-out" completed before starting procedure, as per protocol.   Description of Procedure Process:   Time-out: "Time-out" completed before starting procedure, as per protocol. Position: Supine Target Area: Central-port of intrathecal pump. Approach: Anterior, 90 degree angle approach. Area Prepped: Entire Area around the pump implant. Prepping solution: ChloraPrep (2% chlorhexidine gluconate and 70% isopropyl alcohol) Safety  Precautions: Aspiration looking for blood return was conducted prior to all injections. At no point did we inject any substances, as a needle was being advanced. No attempts were made at seeking any paresthesias. Safe injection practices and needle disposal techniques used. Medications properly checked for expiration dates. SDV (single dose vial) medications used. Description of the Procedure: Protocol guidelines were followed. Two nurses trained to do implant refills were present during the entire procedure. The refill medication was checked  by both healthcare providers as well as the patient. The patient was included in the "Time-out" to verify the medication. The patient was placed in position. The pump was identified. The area was prepped in the usual manner. The sterile template was positioned over the pump, making sure the side-port location matched that of the pump. Both, the pump and the template were held for stability. The needle provided in the Medtronic Kit was then introduced thru the center of the template and into the central port. The pump content was aspirated and discarded volume documented. The new medication was slowly infused into the pump, thru the filter, making sure to avoid overpressure of the device. The needle was then removed and the area cleansed, making sure to leave some of the prepping solution back to take advantage of its long term bactericidal properties. The pump was interrogated and programmed to reflect the correct medication, volume, and dosage. The program was printed and taken to the physician for approval. Once checked and signed by the physician, a copy was provided to the patient and another scanned into the EMR. EBL: None Materials & Medications: Medtronic Refill Kit Medication(s): Please see chart orders for details.  Imaging Guidance:  Type of Imaging Technique: None used Indication(s): N/A Exposure Time: No patient exposure Contrast: None used. Fluoroscopic  Guidance: N/A Ultrasound Guidance: N/A Interpretation: N/A  Antibiotic Prophylaxis:  Indication(s): No indications identified. Type:  Antibiotics Given (last 72 hours)    None      Post-operative Assessment:  Complications: No immediate post-treatment complications observed by team, or reported by patient. Disposition: The patient tolerated the entire procedure well. A repeat set of vitals were taken after the procedure and the patient was kept under observation following institutional policy, for this type of procedure. Post-procedural neurological assessment was performed, showing return to baseline, prior to discharge. The patient was provided with post-procedure discharge instructions, including a section on how to identify potential problems. Should any problems arise concerning this procedure, the patient was given instructions to immediately contact us, at any time, without hesitation. In any case, we plan to contact the patient by telephone for a follow-up status report regarding this interventional procedure. Comments:  Today the patient asked me about increasing the pump further. This triggered a conversation regarding "Drug Holidays". The patient indicated not being interested in doing that. She also asked about the possibility of getting a pain specialist to refill the pump closer to where she lives. I told her that this would not be a problem and that we will look into it.  Plan of Care  Discharge to: Discharge home  Medications ordered for procedure: No orders of the defined types were placed in this encounter.  Medications administered: (For more details, see medical record) Ms. Cates had no medications administered during this visit. Lab-work, Procedure(s), & Referral(s) Ordered: No orders of the defined types were placed in this encounter.  New Prescriptions   No medications on file   Physician-requested Follow-up:  Return for Pump Refill (as per pump  program).  Future Appointments Date Time Provider [place]  05/23/2016 11:15 AM Milinda Pointer, MD [facility] None   Primary Care Physician: Venetia Maxon, DO Location: [facility] Facility Note by: Kathlen Brunswick. Dossie Arbour, M.D, DABA, DABAPM, DABPM, DABIPP, FIPP  Disclaimer:  Medicine is not an exact science. The only guarantee in medicine is that nothing is guaranteed. It is important to note that the decision to proceed with this intervention was based on the  information collected from the patient. The Data and conclusions were drawn from the patient's questionnaire, the interview, and the physical examination. Because the information was provided in large part by the patient, it cannot be guaranteed that it has not been purposely or unconsciously manipulated. Every effort has been made to obtain as much relevant data as possible for this evaluation. It is important to note that the conclusions that lead to this procedure are derived in large part from the available data. Always take into account that the treatment will also be dependent on availability of resources and existing treatment guidelines, considered by other Pain Management Practitioners as being common knowledge and practice, at the time of the intervention. For Medico-Legal purposes, it is also important to point out that variation in procedural techniques and pharmacological choices are the acceptable norm. The indications, contraindications, technique, and results of the above procedure should only be interpreted and judged by a Board-Certified Interventional Pain Specialist with extensive familiarity and expertise in the same exact procedure and technique. Attempts at providing opinions without similar or greater experience and expertise than that of the treating physician will be considered as inappropriate and unethical, and shall result in a formal complaint to the state medical board and applicable  specialty societies.  Instructions provided at this appointment: Patient Instructions  Narcotic Overdose A narcotic overdose is the misuse or overuse of a narcotic drug. A narcotic overdose can make you pass out and stop breathing. If you are not treated right away, this can cause permanent brain damage or stop your heart. Medicine may be given to reverse the effects of an overdose. If so, this medicine may bring on withdrawal symptoms. The symptoms may be abdominal cramps, throwing up (vomiting), sweating, chills, and nervousness. Injecting narcotics can cause more problems than just an overdose. AIDS, hepatitis, and other very serious infections are transmitted by sharing needles and syringes. If you decide to quit using, there are medicines which can help you through the withdrawal period. Trying to quit all at once on your own can be uncomfortable, but not life-threatening. Call your caregiver, Narcotics Anonymous, or any drug and alcohol treatment program for further help.    This information is not intended to replace advice given to you by your health care provider. Make sure you discuss any questions you have with your health care provider.   Document Released: 06/09/2004 Document Revised: 05/23/2014 Document Reviewed: 10/22/2014 Elsevier Interactive Patient Education 2016 [employer] Pain Management Discharge Instructions  General Discharge Instructions :  If you need to reach your doctor call: Monday-Friday 8:00 am - 4:00 pm at [phone] or toll free [phone].  After clinic hours [phone] to have operator reach doctor.  Bring all of your medication bottles to all your appointments in the pain clinic.  To cancel or reschedule your appointment with Pain Management please remember to call 24 hours in advance to avoid a fee.  Refer to the educational materials which you have been given on: General Risks, I had my Procedure. Discharge Instructions, Post Sedation.  Post  Procedure Instructions:  The drugs you were given will stay in your system until tomorrow, so for the next 24 hours you should not drive, make any legal decisions or drink any alcoholic beverages.  You may eat anything you prefer, but it is better to start with liquids then soups and crackers, and gradually work up to solid foods.  Please notify your doctor immediately if you have any unusual bleeding, trouble breathing or pain  that is not related to your normal pain.  Depending on the type of procedure that was done, some parts of your body may feel week and/or numb.  This usually clears up by tonight or the next day.  Walk with the use of an assistive device or accompanied by an adult for the 24 hours.  You may use ice on the affected area for the first 24 hours.  Put ice in a Ziploc bag and cover with a towel and place against area 15 minutes on 15 minutes off.  You may switch to heat after 24 hours.

## 2016-03-22 NOTE — Patient Instructions (Signed)
Narcotic Overdose A narcotic overdose is the misuse or overuse of a narcotic drug. A narcotic overdose can make you pass out and stop breathing. If you are not treated right away, this can cause permanent brain damage or stop your heart. Medicine may be given to reverse the effects of an overdose. If so, this medicine may bring on withdrawal symptoms. The symptoms may be abdominal cramps, throwing up (vomiting), sweating, chills, and nervousness. Injecting narcotics can cause more problems than just an overdose. AIDS, hepatitis, and other very serious infections are transmitted by sharing needles and syringes. If you decide to quit using, there are medicines which can help you through the withdrawal period. Trying to quit all at once on your own can be uncomfortable, but not life-threatening. Call your caregiver, Narcotics Anonymous, or any drug and alcohol treatment program for further help.    This information is not intended to replace advice given to you by your health care provider. Make sure you discuss any questions you have with your health care provider.   Document Released: 06/09/2004 Document Revised: 05/23/2014 Document Reviewed: 10/22/2014 Elsevier Interactive Patient Education 2016 [employer] Pain Management Discharge Instructions  General Discharge Instructions :  If you need to reach your doctor call: Monday-Friday 8:00 am - 4:00 pm at [phone] or toll free [phone].  After clinic hours [phone] to have operator reach doctor.  Bring all of your medication bottles to all your appointments in the pain clinic.  To cancel or reschedule your appointment with Pain Management please remember to call 24 hours in advance to avoid a fee.  Refer to the educational materials which you have been given on: General Risks, I had my Procedure. Discharge Instructions, Post Sedation.  Post Procedure Instructions:  The drugs you were given will stay in your system until tomorrow,  so for the next 24 hours you should not drive, make any legal decisions or drink any alcoholic beverages.  You may eat anything you prefer, but it is better to start with liquids then soups and crackers, and gradually work up to solid foods.  Please notify your doctor immediately if you have any unusual bleeding, trouble breathing or pain that is not related to your normal pain.  Depending on the type of procedure that was done, some parts of your body may feel week and/or numb.  This usually clears up by tonight or the next day.  Walk with the use of an assistive device or accompanied by an adult for the 24 hours.  You may use ice on the affected area for the first 24 hours.  Put ice in a Ziploc bag and cover with a towel and place against area 15 minutes on 15 minutes off.  You may switch to heat after 24 hours.

## 2016-03-22 NOTE — Progress Notes (Signed)
Safety precautions to be maintained throughout the outpatient stay will include: orient to surroundings, keep bed in low position, maintain call bell within reach at all times, provide assistance with transfer out of bed and ambulation.  

## 2016-03-28 MED FILL — Medication: INTRATHECAL | Qty: 1 | Status: AC

## 2016-04-25 ENCOUNTER — Other Ambulatory Visit: Payer: Self-pay

## 2016-04-25 MED ORDER — PAIN MANAGEMENT IT PUMP REFILL: 1.0000 | Freq: Once | INTRATHECAL | 0 refills | Status: DC

## 2016-05-23 ENCOUNTER — Encounter: Payer: Medicare Other | Admitting: Pain Medicine

## 2016-05-24 ENCOUNTER — Encounter: Payer: Self-pay | Admitting: Pain Medicine

## 2016-05-24 ENCOUNTER — Ambulatory Visit: Payer: Medicare Other | Attending: Pain Medicine | Admitting: Pain Medicine

## 2016-05-24 VITALS — BP 135/65 | HR 83 | Temp 98.2°F | Resp 18 | Ht 62.0 in | Wt 185.0 lb

## 2016-05-24 DIAGNOSIS — M79605 Pain in left leg: Secondary | ICD-10-CM | POA: Insufficient documentation

## 2016-05-24 DIAGNOSIS — G894 Chronic pain syndrome: Secondary | ICD-10-CM

## 2016-05-24 DIAGNOSIS — M533 Sacrococcygeal disorders, not elsewhere classified: Secondary | ICD-10-CM | POA: Insufficient documentation

## 2016-05-24 DIAGNOSIS — Z978 Presence of other specified devices: Secondary | ICD-10-CM

## 2016-05-24 DIAGNOSIS — M5442 Lumbago with sciatica, left side: Secondary | ICD-10-CM | POA: Diagnosis not present

## 2016-05-24 DIAGNOSIS — Z451 Encounter for adjustment and management of infusion pump: Secondary | ICD-10-CM | POA: Insufficient documentation

## 2016-05-24 DIAGNOSIS — Z9689 Presence of other specified functional implants: Secondary | ICD-10-CM

## 2016-05-24 DIAGNOSIS — M961 Postlaminectomy syndrome, not elsewhere classified: Secondary | ICD-10-CM

## 2016-05-24 DIAGNOSIS — F1190 Opioid use, unspecified, uncomplicated: Secondary | ICD-10-CM

## 2016-05-24 DIAGNOSIS — Z79891 Long term (current) use of opiate analgesic: Secondary | ICD-10-CM | POA: Insufficient documentation

## 2016-05-24 DIAGNOSIS — G8929 Other chronic pain: Secondary | ICD-10-CM

## 2016-05-24 DIAGNOSIS — M545 Low back pain: Secondary | ICD-10-CM | POA: Insufficient documentation

## 2016-05-24 NOTE — Patient Instructions (Signed)
Return as scheduled 

## 2016-05-24 NOTE — Progress Notes (Signed)
Patient's Name: Tiffany Hunter  MRN: [id number]  Referring Provider: Venetia Maxon, DO  DOB: 1945/05/17  PCP: Tiffany Maxon, DO  DOS: 05/24/2016  Note by: Tiffany Hunter. Tiffany Arbour, MD  Service setting: Ambulatory outpatient  Location: [facility] (AMB) Pain Management Facility  Visit type: Procedure  Specialty: Interventional Pain Management  Patient type: Established   Primary Reason for Visit: Interventional Pain Management Treatment. CC: Back Pain (lower)  Procedure:  Intrathecal Drug Delivery System (IDDS):  Type: Reservoir Refill [phone]) No rate change Region: Abdominal Laterality: Left  Type of Pump: Medtronic Synchromed II (MRI-compatible) Delivery Route: Intrathecal Type of Pain Treated: Neuropathic/Nociceptive Primary Medication Class: Opioid/opiate  Medication, Concentration, Infusion Program, & Delivery Rate: Please see scanned programming printout.  Indications: 1. Chronic pain syndrome   2. Chronic lower extremity pain (Location of Primary Source of Pain) (Left)   3. Chronic low back pain (Location of Secondary source of pain) (Left)   4. Chronic sacroiliac joint pain (Location of Tertiary source of pain) (Left)   5. Failed back surgical syndrome   6. Encounter for adjustment or management of infusion pump   7. 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 Long term current use of opiate analgesic   9. Opiate use (586.8 MME/Day)    Pain Assessment: Self-Reported Pain Score: 8 /10             Reported level is compatible with observation.        Intrathecal Pump Therapy Assessment  Manufacturer: Medtronic Synchromed II Type: Programmable Volume: 40 mL reservoir MRI compatibility: Yes   Drug content:  Primary Medication Class: Opioid Primary Medication: PF-Hydromorphone (Dilaudid) Secondary Medication: PF-Sufentanil Other Medication: see pump readout   Programming:  Type: Simple continuous. See pump readout for details.   Changes:   Medication Change: None at this point Rate Change: No change in rate  Reported side-effects or adverse reactions: None reported  Effectiveness: Described as relatively effective, allowing for increase in activities of daily living (ADL) Clinically meaningful improvement in function (CMIF): Sustained CMIF goals met  Plan: Pump refill today  Pre-Procedure Assessment:  Tiffany Hunter is a 71 y.o. (year old), female patient, seen today for interventional treatment. She  has a past surgical history that includes Carpal tunnel release; Back surgery; Breast surgery (Bilateral); Cholecystectomy; Colonoscopy w/ polypectomy; Cataract extraction w/ intraocular lens implant (Right); Fracture surgery (Right); and Abdominal hysterectomy.. Her primarily concern today is the Back Pain (lower)  Pain Type: Chronic pain Pain Location: Back Pain Orientation: Lower Pain Descriptors / Indicators: Sharp Pain Frequency: Intermittent  Verification of the correct person, correct site (including marking of site), and correct procedure were performed and confirmed by the patient.  Consent: Before the procedure and under the influence of no sedative(s), amnesic(s), or anxiolytics, the patient was informed of the treatment options, risks and possible complications. To fulfill our ethical and legal obligations, as recommended by the American Medical Association's Code of Ethics, I have informed the patient of my clinical impression; the nature and purpose of the treatment or procedure; the risks, benefits, and possible complications of the intervention; the alternatives, including doing nothing; the risk(s) and benefit(s) of the alternative treatment(s) or procedure(s); and the risk(s) and benefit(s) of doing nothing.  Tiffany Hunter was provided with information about the general risks and possible complications associated with most interventional procedures. These include, but are not limited to: failure to achieve desired  goals, infection, bleeding, organ or nerve damage, allergic reactions, paralysis, and/or death.  In addition, she was informed of those risks and possible complications associated to this particular procedure, which include, but are not limited to: damage to the implant; failure to decrease pain; local, systemic, or serious CNS infections, intraspinal abscess with possible cord compression and paralysis, or life-threatening such as meningitis; bleeding; organ damage; nerve injury or damage with subsequent sensory, motor, and/or autonomic system dysfunction, resulting in transient or permanent pain, numbness, and/or weakness of one or several areas of the body; allergic reactions, either minor or major life-threatening, such as anaphylactic or anaphylactoid reactions.  Furthermore, Tiffany Hunter was informed of those risks and complications associated with the medications. These include, but are not limited to: allergic reactions (i.e.: anaphylactic or anaphylactoid reactions); endorphine suppression; bradycardia and/or hypotension; water retention and/or peripheral vascular relaxation leading to lower extremity edema and possible stasis ulcers; respiratory depression and/or shortness of breath; decreased metabolic rate leading to weight gain; swelling or edema; medication-induced neural toxicity; particulate matter embolism and blood vessel occlusion with resultant organ, and/or nervous system infarction; and/or intrathecal granuloma formation with possible spinal cord compression and permanent paralysis.  Before refilling the pump Tiffany Hunter was informed that some of the medications used in the devise may not be FDA approved for such use and therefore it constitutes an off-label use of the medications.  Finally, she was informed that Medicine is not an exact science; therefore, there is also the possibility of unforeseen or unpredictable risks and/or possible complications that may result in a catastrophic  outcome. The patient indicated having understood very clearly. We have given the patient no guarantees and we have made no promises. Enough time was given to the patient to ask questions, all of which were answered to the patient's satisfaction. Ms. Gazzola has indicated that she wanted to continue with the procedure.  Consent Attestation: I, the ordering provider, attest that I have discussed with the patient the benefits, risks, side-effects, alternatives, likelihood of achieving goals, and potential problems during recovery for the procedure that I have provided informed consent.  Pre-Procedure Preparation:  Safety Precautions: Allergies reviewed. The patient was asked about blood thinners, or active infections, both of which were denied. The patient was asked to confirm the procedure and laterality, before marking the site, and again before commencing the procedure. Appropriate site, procedure, and patient were confirmed by following the Joint Commission's Universal Protocol (UP.01.01.01), in the form of a "Time Out". The patient was asked to participate by confirming the accuracy of the "Time Out" information. Patient was assessed for positional comfort and pressure points before starting the procedure. Allergies: She is allergic to doxycycline hyclate; sulfa antibiotics; and sulfamethoxazole-trimethoprim. Allergy Precautions: None required Infection Control Precautions: Sterile technique used. Standard Universal Precautions were taken as recommended by the Department of [facility] for Disease Control and Prevention (CDC). Standard pre-surgical skin prep was conducted. Respiratory hygiene and cough etiquette was practiced. Hand hygiene observed. Safe injection practices and needle disposal techniques followed. SDV (single dose vial) medications used. Medications properly checked for expiration dates and contaminants. Personal protective equipment (PPE) used as per protocol. Monitoring:  As  per clinic protocol. Vitals:   05/24/16 1019  BP: 135/65  Pulse: 83  Resp: 18  Temp: 98.2 F (36.8 C)  TempSrc: Oral  SpO2: 96%  Weight: 185 lb (83.9 kg)  Height: '5\' 2"'  (1.575 m)  Calculated BMI: Body mass index is 33.84 kg/m. Time-out: "Time-out" completed before starting procedure, as per protocol.  Description of Procedure Process:   Time-out: "Time-out"  completed before starting procedure, as per protocol. Position: Supine Target Area: Central-port of intrathecal pump. Approach: Anterior, 90 degree angle approach. Area Prepped: Entire Area around the pump implant. Prepping solution: ChloraPrep (2% chlorhexidine gluconate and 70% isopropyl alcohol) Safety Precautions: Aspiration looking for blood return was conducted prior to all injections. At no point did we inject any substances, as a needle was being advanced. No attempts were made at seeking any paresthesias. Safe injection practices and needle disposal techniques used. Medications properly checked for expiration dates. SDV (single dose vial) medications used. Description of the Procedure: Protocol guidelines were followed. Two nurses trained to do implant refills were present during the entire procedure. The refill medication was checked by both healthcare providers as well as the patient. The patient was included in the "Time-out" to verify the medication. The patient was placed in position. The pump was identified. The area was prepped in the usual manner. The sterile template was positioned over the pump, making sure the side-port location matched that of the pump. Both, the pump and the template were held for stability. The needle provided in the Medtronic Kit was then introduced thru the center of the template and into the central port. The pump content was aspirated and discarded volume documented. The new medication was slowly infused into the pump, thru the filter, making sure to avoid overpressure of the device. The needle  was then removed and the area cleansed, making sure to leave some of the prepping solution back to take advantage of its long term bactericidal properties. The pump was interrogated and programmed to reflect the correct medication, volume, and dosage. The program was printed and taken to the physician for approval. Once checked and signed by the physician, a copy was provided to the patient and another scanned into the EMR. EBL: None Materials & Medications: Medtronic Refill Kit Medication(s): Please see chart orders for details.  Imaging Guidance:  Type of Imaging Technique: None used Indication(s): N/A Exposure Time: No patient exposure Contrast: None used. Fluoroscopic Guidance: N/A Ultrasound Guidance: N/A Interpretation: N/A  Antibiotic Prophylaxis:  Indication(s): No indications identified. Type:  Antibiotics Given (last 72 hours)    None      Post-procedure Assessment:  Complications: None. Procedure was completely uneventful. Disposition: The patient tolerated the entire procedure well. A repeat set of vitals were taken after the procedure. No post-procedural neurological deterioration observed. The patient was provided with discharge instructions, including how to identify potential problems. Should any problems arise concerning this procedure, the patient was given instructions to immediately contact us, at any time, without hesitation. Comments:  No additional relevant information.  Assessment  Primary Diagnosis & Pertinent Problem List: The primary encounter diagnosis was Chronic pain syndrome. Diagnoses of Chronic lower extremity pain (Location of Primary Source of Pain) (Left), Chronic low back pain (Location of Secondary source of pain) (Left), Chronic sacroiliac joint pain (Location of Tertiary source of pain) (Left), Failed back surgical syndrome, Encounter for adjustment or management of infusion pump, Presence of intrathecal pump (Medtronic programmable intrathecal  pump), Long term current use of opiate analgesic, and Opiate use (586.8 MME/Day) were also pertinent to this visit.  Status Diagnosis  Stable Stable Stable 1. Chronic pain syndrome   2. Chronic lower extremity pain (Location of Primary Source of Pain) (Left)   3. Chronic low back pain (Location of Secondary source of pain) (Left)   4. Chronic sacroiliac joint pain (Location of Tertiary source of pain) (Left)   5. Failed back surgical syndrome  6. Encounter for adjustment or management of infusion pump   7. 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 Long term current use of opiate analgesic   9. Opiate use (586.8 MME/Day)      Plan of Care  Discharge to: Discharge home  Medications ordered for procedure: No orders of the defined types were placed in this encounter.  Medications administered: (For more details, see medical record) Ms. Nicolini had no medications administered during this visit. Lab-work, Procedure(s), & Referral(s) Ordered: Orders Placed This Encounter  Procedures  . PUMP REFILL  . PUMP REFILL   Imaging Ordered: New Prescriptions   No medications on file   Physician-requested Follow-up:  Return for Pump Refill (as per pump program).  Future Appointments Date Time Provider [place]  07/20/2016 11:15 AM Milinda Pointer, MD [facility] None   Primary Care Physician: Tiffany Maxon, DO Location: [facility] Out[facility] Facility Note by: Tiffany Hunter. Tiffany Hunter, M.D, DABA, DABAPM, DABPM, DABIPP, FIPP Date: 05/25/16; Time: 10:43 PM  Disclaimer:  Medicine is not an Chief Strategy Officer. The only guarantee in medicine is that nothing is guaranteed. It is important to note that the decision to proceed with this intervention was based on the information collected from the patient. The Data and conclusions were drawn from the patient's questionnaire, the interview, and the physical examination. Because the information was  provided in large part by the patient, it cannot be guaranteed that it has not been purposely or unconsciously manipulated. Every effort has been made to obtain as much relevant data as possible for this evaluation. It is important to note that the conclusions that lead to this procedure are derived in large part from the available data. Always take into account that the treatment will also be dependent on availability of resources and existing treatment guidelines, considered by other Pain Management Practitioners as being common knowledge and practice, at the time of the intervention. For Medico-Legal purposes, it is also important to point out that variation in procedural techniques and pharmacological choices are the acceptable norm. The indications, contraindications, technique, and results of the above procedure should only be interpreted and judged by a Board-Certified Interventional Pain Specialist with extensive familiarity and expertise in the same exact procedure and technique. Attempts at providing opinions without similar or greater experience and expertise than that of the treating physician will be considered as inappropriate and unethical, and shall result in a formal complaint to the state medical board and applicable specialty societies.  Instructions provided at this appointment: Patient Instructions  Return as scheduled/

## 2016-05-24 NOTE — Progress Notes (Signed)
Safety precautions to be maintained throughout the outpatient stay will include: orient to surroundings, keep bed in low position, maintain call bell within reach at all times, provide assistance with transfer out of bed and ambulation.  

## 2016-05-25 ENCOUNTER — Telehealth: Payer: Self-pay

## 2016-05-25 NOTE — Telephone Encounter (Signed)
Post pump phone call.  Patient states she is doing good.  Instructed patient not to leave appointments without telemetry printout from each pump refill, or an appointment.  Will mail pump printout to patient.  Patient states understanding.

## 2016-05-26 MED FILL — Medication: INTRATHECAL | Qty: 1 | Status: AC

## 2016-05-28 DIAGNOSIS — K529 Noninfective gastroenteritis and colitis, unspecified: Secondary | ICD-10-CM | POA: Insufficient documentation

## 2016-05-28 DIAGNOSIS — R1084 Generalized abdominal pain: Secondary | ICD-10-CM | POA: Insufficient documentation

## 2016-07-05 ENCOUNTER — Other Ambulatory Visit: Payer: Self-pay

## 2016-07-05 MED ORDER — PAIN MANAGEMENT IT PUMP REFILL: 1.0000 | Freq: Once | INTRATHECAL | 0 refills | Status: DC

## 2016-07-20 ENCOUNTER — Encounter: Payer: Self-pay | Admitting: Pain Medicine

## 2016-07-20 ENCOUNTER — Ambulatory Visit: Payer: Medicare Other | Attending: Pain Medicine | Admitting: Pain Medicine

## 2016-07-20 VITALS — BP 133/68 | HR 72 | Temp 98.6°F | Resp 18 | Ht 62.0 in | Wt 180.0 lb

## 2016-07-20 DIAGNOSIS — G894 Chronic pain syndrome: Secondary | ICD-10-CM | POA: Diagnosis not present

## 2016-07-20 DIAGNOSIS — Z451 Encounter for adjustment and management of infusion pump: Secondary | ICD-10-CM

## 2016-07-20 DIAGNOSIS — M545 Low back pain: Secondary | ICD-10-CM | POA: Insufficient documentation

## 2016-07-20 DIAGNOSIS — M79605 Pain in left leg: Secondary | ICD-10-CM | POA: Diagnosis not present

## 2016-07-20 DIAGNOSIS — Z978 Presence of other specified devices: Secondary | ICD-10-CM

## 2016-07-20 DIAGNOSIS — G8929 Other chronic pain: Secondary | ICD-10-CM

## 2016-07-20 DIAGNOSIS — Z4681 Encounter for fitting and adjustment of insulin pump: Secondary | ICD-10-CM | POA: Diagnosis not present

## 2016-07-20 DIAGNOSIS — M47816 Spondylosis without myelopathy or radiculopathy, lumbar region: Secondary | ICD-10-CM | POA: Diagnosis not present

## 2016-07-20 DIAGNOSIS — M5442 Lumbago with sciatica, left side: Secondary | ICD-10-CM

## 2016-07-20 DIAGNOSIS — Z9689 Presence of other specified functional implants: Secondary | ICD-10-CM

## 2016-07-20 DIAGNOSIS — Z79891 Long term (current) use of opiate analgesic: Secondary | ICD-10-CM | POA: Diagnosis not present

## 2016-07-20 DIAGNOSIS — F1190 Opioid use, unspecified, uncomplicated: Secondary | ICD-10-CM

## 2016-07-20 NOTE — Progress Notes (Signed)
Safety precautions to be maintained throughout the outpatient stay will include: orient to surroundings, keep bed in low position, maintain call bell within reach at all times, provide assistance with transfer out of bed and ambulation.  

## 2016-07-20 NOTE — Patient Instructions (Addendum)
Pain Score  Introduction: The pain score used by this practice is the Verbal Numerical Rating Scale (VNRS-11). This is an 11-point scale. It is for adults and children 10 years or older. There are significant differences in how the pain score is reported, used, and applied. Forget everything you learned in the past and learn this scoring system.  General Information: The scale should reflect your current level of pain. Unless you are specifically asked for the level of your worst pain, or your average pain. If you are asked for one of these two, then it should be understood that it is over the past 24 hours.  Basic Activities of Daily Living (ADL): Personal hygiene, dressing, eating, transferring, and using restroom.  Instructions: Most patients tend to report their level of pain as a combination of two factors, their physical pain and their psychosocial pain. This last one is also known as "suffering" and it is reflection of how physical pain affects you socially and psychologically. From now on, report them separately. From this point on, when asked to report your pain level, report only your physical pain. Use the following table for reference.  Pain Clinic Pain Levels (0-5/10)  Pain Level Score Description  No Pain 0   Mild pain 1 Nagging, annoying, but does not interfere with basic activities of daily living (ADL). Patients are able to eat, bathe, get dressed, toileting (being able to get on and off the toilet and perform personal hygiene functions), transfer (move in and out of bed or a chair without assistance), and maintain continence (able to control bladder and bowel functions). Blood pressure and heart rate are unaffected. A normal heart rate for a healthy adult ranges from 60 to 100 bpm (beats per minute).   Mild to moderate pain 2 Noticeable and distracting. Impossible to hide from other people. More frequent flare-ups. Still possible to adapt and function close to normal. It can be very  annoying and may have occasional stronger flare-ups. With discipline, patients may get used to it and adapt.   Moderate pain 3 Interferes significantly with activities of daily living (ADL). It becomes difficult to feed, bathe, get dressed, get on and off the toilet or to perform personal hygiene functions. Difficult to get in and out of bed or a chair without assistance. Very distracting. With effort, it can be ignored when deeply involved in activities.   Moderately severe pain 4 Impossible to ignore for more than a few minutes. With effort, patients may still be able to manage work or participate in some social activities. Very difficult to concentrate. Signs of autonomic nervous system discharge are evident: dilated pupils (mydriasis); mild sweating (diaphoresis); sleep interference. Heart rate becomes elevated (>115 bpm). Diastolic blood pressure (lower number) rises above 100 mmHg. Patients find relief in laying down and not moving.   Severe pain 5 Intense and extremely unpleasant. Associated with frowning face and frequent crying. Pain overwhelms the senses.  Ability to do any activity or maintain social relationships becomes significantly limited. Conversation becomes difficult. Pacing back and forth is common, as getting into a comfortable position is nearly impossible. Pain wakes you up from deep sleep. Physical signs will be obvious: pupillary dilation; increased sweating; goosebumps; brisk reflexes; cold, clammy hands and feet; nausea, vomiting or dry heaves; loss of appetite; significant sleep disturbance with inability to fall asleep or to remain asleep. When persistent, significant weight loss is observed due to the complete loss of appetite and sleep deprivation.  Blood pressure and heart   rate becomes significantly elevated. Caution: If elevated blood pressure triggers a pounding headache associated with blurred vision, then the patient should immediately seek attention at an urgent or  emergency care unit, as these may be signs of an impending stroke.    Emergency Department Pain Levels (6-10/10)  Emergency Room Pain 6 Severely limiting. Requires emergency care and should not be seen or managed at an outpatient pain management facility. Communication becomes difficult and requires great effort. Assistance to reach the emergency department may be required. Facial flushing and profuse sweating along with potentially dangerous increases in heart rate and blood pressure will be evident.   Distressing pain 7 Self-care is very difficult. Assistance is required to transport, or use restroom. Assistance to reach the emergency department will be required. Tasks requiring coordination, such as bathing and getting dressed become very difficult.   Disabling pain 8 Self-care is no longer possible. At this level, pain is disabling. The individual is unable to do even the most "basic" activities such as walking, eating, bathing, dressing, transferring to a bed, or toileting. Fine motor skills are lost. It is difficult to think clearly.   Incapacitating pain 9 Pain becomes incapacitating. Thought processing is no longer possible. Difficult to remember your own name. Control of movement and coordination are lost.   The worst pain imaginable 10 At this level, most patients pass out from pain. When this level is reached, collapse of the autonomic nervous system occurs, leading to a sudden drop in blood pressure and heart rate. This in turn results in a temporary and dramatic drop in blood flow to the brain, leading to a loss of consciousness. Fainting is one of the body's self defense mechanisms. Passing out puts the brain in a calmed state and causes it to shut down for a while, in order to begin the healing process.    Summary: 1. Refer to this scale when providing [place] with your pain level. 2. Be accurate and careful when reporting your pain level. This will help with your care. 3. Over-reporting  your pain level will lead to loss of credibility. 4. Even a level of 1/10 means that there is pain and will be treated at our facility. 5. High, inaccurate reporting will be documented as "Symptom Exaggeration", leading to loss of credibility and suspicions of possible secondary gains such as obtaining more narcotics, or wanting to appear disabled, for fraudulent reasons. 6. Only pain levels of 5 or below will be seen at our facility. 7. Pain levels of 6 and above will be sent to the Emergency Department and the appointment cancelled. _____________________________________________________________________________________________   Opioid Overdose Opioids are substances that relieve pain by binding to pain receptors in your brain and spinal cord. Opioids include illegal drugs, such as heroin, as well as prescription pain medicines.An opioid overdose happens when you take too much of an opioid substance. This can happen with any type of opioid, including:  Heroin.  Morphine.  Codeine.  Methadone.  Oxycodone.  Hydrocodone.  Fentanyl.  Hydromorphone.  Buprenorphine. The effects of an overdose can be mild, dangerous, or even deadly. Opioid overdose is a medical emergency. What are the causes? This condition may be caused by:  Taking too much of an opioid by accident.  Taking too much of an opioid on purpose.  An error made by a health care provider who prescribes a medicine.  An error made by the pharmacist who fills the prescription order.  Using more than one substance that contains opioids at  the same time.  Mixing an opioid with a substance that affects your heart, breathing, or blood pressure. These include alcohol, tranquilizers, sleeping pills, illegal drugs, and some over-the-counter medicines. What increases the risk? This condition is more likely in:  Children. They may be attracted to colorful pills. Because of a child's small size, even a small amount of a drug can  be dangerous.  Elderly people. They may be taking many different drugs. Elderly people may have difficulty reading labels or remembering when they last took their medicine.  People who take an opioid on a long-term basis.  People who use:  Illegal drugs.  Other substances, including alcohol, while using an opioid.  People who have:  A history of drug or alcohol abuse.  Certain mental health conditions.  People who take opioids that are not prescribed for them. What are the signs or symptoms? Symptoms of this condition depend on the type of opioid and the amount that was taken. Common symptoms include:  Sleepiness or difficulty waking from sleep.  Confusion.  Slurred speech.  Slowed breathing and a slow pulse.  Nausea and vomiting.  Abnormally small pupils. Signs and symptoms that require emergency treatment include:  Cold, clammy, and pale skin.  Blue lips and fingernails.  Vomiting.  Gurgling sounds in the throat.  A pulse that is very slow or difficult to detect.  Breathing that is very slow, noisy, or difficult to detect.  Limp body.  Inability to respond to speech or be awakened from sleep (stupor). How is this diagnosed? This condition is diagnosed based on your symptoms. It is important to tell your health care provider:  All of the opioidsthat you took.  When you took the opioids.  Whether you were drinking alcohol or using other substances. Your health care provider will do a physical exam. This exam may include:  Checking and monitoring your heart rate and rhythm, your breathing rate and depth, your temperature, and your blood pressure (vital signs).  Checking for abnormally small pupils.  Measuring oxygen levels in your blood. You may also have blood tests or urine tests. How is this treated? Supporting your vital signs and your breathing is the first step in treating an opioid overdose. Treatment may also include:  Giving fluids and  minerals (electrolytes) through an IV tube.  Inserting a breathing tube (endotracheal tube) in your airway to help you breathe.  Giving oxygen.  Passing a tube through your nose and into your stomach (NG tube, or nasogastric tube) to wash out your stomach.  Giving medicines that:  Increase your blood pressure.  Absorb any opioid that is in your digestive system.  Reverse the effects of the opioid (naloxone).  Ongoing counseling and mental health support if you intentionally overdosed or used an illegal drug. Follow these instructions at home:  Take over-the-counter and prescription medicines only as told by your health care provider. Always ask your health care provider about possible side effects and interactions of any new medicine that you start taking.  Keep a list of all of the medicines that you take, including over-the-counter medicines. Bring this list with you to all of your medical visits.  Drink enough fluid to keep your urine clear or pale yellow.  Keep all follow-up visits as told by your health care provider. This is important. How is this prevented?  Get help if you are struggling with:  Alcohol or drug use.  Depression or another mental health problem.  Keep the phone number of  your local poison control center near your phone or on your cell phone.  Store all medicines in safety containers that are out of the reach of children.  Read the drug inserts that come with your medicines.  Do not drink alcohol when taking opioids.  Do not use illegal drugs.  Do not take opioid medicines that are not prescribed for you. Contact a health care provider if:  Your symptoms return.  You develop new symptoms or side effects when you are taking medicines. Get help right away if:  You think that you or someone else may have taken too much of an opioid. The hotline of the [facility] is (705)654-0689.  You or someone else is having symptoms of  an opioid overdose.  You have serious thoughts about hurting yourself or others.  You have:  Chest pain.  Difficulty breathing.  A loss of consciousness. Opioid overdose is an emergency. Do not wait to see if the symptoms will go away. Get medical help right away. Call your local emergency services (911 in the [place] Do not drive yourself to the hospital. This information is not intended to replace advice given to you by your health care provider. Make sure you discuss any questions you have with your health care provider. Document Released: 06/09/2004 Document Revised: 10/08/2015 Document Reviewed: 10/16/2014 [employer] Interactive Patient Education  2017 [employer].

## 2016-07-20 NOTE — Progress Notes (Signed)
Patient's Name: Tiffany Hunter  MRN: [id number]  Referring Provider: Venetia Maxon, DO  DOB: 05-Apr-1946  PCP: Venetia Maxon, DO  DOS: 07/20/2016  Note by: Kathlen Brunswick. Dossie Arbour, MD  Service setting: Ambulatory outpatient  Location: [facility] (AMB) Pain Management Facility  Visit type: Procedure  Specialty: Interventional Pain Management  Patient type: Established   Primary Reason for Visit: Interventional Pain Management Treatment. CC: Back Pain  Procedure:  Intrathecal Drug Delivery System (IDDS):  Type: Reservoir Refill [phone]) No rate change Region: Abdominal Laterality: Left  Type of Pump: Medtronic Synchromed II (MRI-compatible) Delivery Route: Intrathecal Type of Pain Treated: Neuropathic/Nociceptive Primary Medication Class: Opioid/opiate  Medication, Concentration, Infusion Program, & Delivery Rate: Please see scanned programming printout.  Indications: 1. Lumbar spondylosis   2. Chronic pain syndrome   3. Chronic lower extremity pain (Location of Primary Source of Pain) (Left)   4. Chronic low back pain (Location of Secondary source of pain) (Left)   5. Long term current use of opiate analgesic   6. Opiate use (586.8 MME/Day)   7. Encounter for adjustment or management of infusion pump   8. 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 Information on the proper use of the pain score provided to the patient today.  Intrathecal Pump Therapy Assessment  Manufacturer: Medtronic Synchromed II Type: Programmable Volume: 40 mL reservoir MRI compatibility: Yes   Drug content:  Primary Medication Class: Opioid Primary Medication: PF-Hydromorphone (Dilaudid) Secondary Medication: PF-Sufentanil Other Medication: see pump readout   Programming:  Type: Simple continuous. See pump readout for details.     Changes:  Medication Change: None at this point Rate Change: No change in rate  Reported side-effects or adverse reactions: None reported  Effectiveness: Described as relatively effective, allowing for increase in activities of daily living (ADL) Clinically meaningful improvement in function (CMIF): Sustained CMIF goals met  Plan: Pump refill today  Pre-op Assessment:  Previous date of service:   Service provided:  (pump refill) Tiffany Hunter is a 71 y.o. (year old), female patient, seen today for interventional treatment. She  has a past surgical history that includes Carpal tunnel release; Back surgery; Breast surgery (Bilateral); Cholecystectomy; Colonoscopy w/ polypectomy; Cataract extraction w/ intraocular lens implant (Right); Fracture surgery (Right); and Abdominal hysterectomy. Her primarily concern today is the Back Pain  Initial Vital Signs: Blood pressure 133/68, pulse 72, temperature 98.6 F (37 C), temperature source Oral, resp. rate 18, height '5\' 2"'  (1.575 m), weight 180 lb (81.6 kg), SpO2 98 %. BMI: 32.92 kg/m  Risk Assessment: Allergies: Reviewed. She is allergic to doxycycline hyclate; sulfa antibiotics; and sulfamethoxazole-trimethoprim.  Allergy Precautions: None required Coagulopathies: "Reviewed. None identified.  Blood-thinner therapy: None at this time Active Infection(s): Reviewed. None identified. Tiffany Hunter is afebrile  Site Confirmation: Tiffany Hunter was asked to confirm the procedure and laterality before marking the site Procedure checklist: Completed Consent: Before the procedure and under the influence of no sedative(s), amnesic(s), or anxiolytics, the patient was informed of the treatment options, risks and possible complications. To fulfill our ethical and legal obligations, as recommended by the American Medical Association's Code of Ethics, I have informed the patient of my clinical impression; the nature and purpose of the treatment or procedure;  the risks, benefits, and possible complications of the intervention; the alternatives, including doing nothing; the risk(s) and benefit(s) of the alternative treatment(s)  or procedure(s); and the risk(s) and benefit(s) of doing nothing.  Tiffany Hunter was provided with information about the general risks and possible complications associated with most interventional procedures. These include, but are not limited to: failure to achieve desired goals, infection, bleeding, organ or nerve damage, allergic reactions, paralysis, and/or death.  In addition, she was informed of those risks and possible complications associated to this particular procedure, which include, but are not limited to: damage to the implant; failure to decrease pain; local, systemic, or serious CNS infections, intraspinal abscess with possible cord compression and paralysis, or life-threatening such as meningitis; bleeding; organ damage; nerve injury or damage with subsequent sensory, motor, and/or autonomic system dysfunction, resulting in transient or permanent pain, numbness, and/or weakness of one or several areas of the body; allergic reactions, either minor or major life-threatening, such as anaphylactic or anaphylactoid reactions.  Furthermore, Tiffany Hunter was informed of those risks and complications associated with the medications. These include, but are not limited to: allergic reactions (i.e.: anaphylactic or anaphylactoid reactions); endorphine suppression; bradycardia and/or hypotension; water retention and/or peripheral vascular relaxation leading to lower extremity edema and possible stasis ulcers; respiratory depression and/or shortness of breath; decreased metabolic rate leading to weight gain; swelling or edema; medication-induced neural toxicity; particulate matter embolism and blood vessel occlusion with resultant organ, and/or nervous system infarction; and/or intrathecal granuloma formation with possible spinal cord  compression and permanent paralysis.  Before refilling the pump Ms. Exley was informed that some of the medications used in the devise may not be FDA approved for such use and therefore it constitutes an off-label use of the medications.  Finally, she was informed that Medicine is not an exact science; therefore, there is also the possibility of unforeseen or unpredictable risks and/or possible complications that may result in a catastrophic outcome. The patient indicated having understood very clearly. We have given the patient no guarantees and we have made no promises. Enough time was given to the patient to ask questions, all of which were answered to the patient's satisfaction. Ms. Posa has indicated that she wanted to continue with the procedure. Attestation: I, the ordering provider, attest that I have discussed with the patient the benefits, risks, side-effects, alternatives, likelihood of achieving goals, and potential problems during recovery for the procedure that I have provided informed consent. Date: 07/20/2016; Time: 11:41 AM  Pre-Procedure Preparation:  Monitoring: As per clinic protocol. Respiration, ETCO2, SpO2, BP, heart rate and rhythm monitor placed and checked for adequate function Safety Precautions: Patient was assessed for positional comfort and pressure points before starting the procedure. Time-out: I initiated and conducted the "Time-out" before starting the procedure, as per protocol. The patient was asked to participate by confirming the accuracy of the "Time Out" information. Verification of the correct person, site, and procedure were performed and confirmed by me, the nursing staff, and the patient. "Time-out" conducted as per Joint Commission's Universal Protocol (UP.01.01.01). "Time-out" Date & Time: 07/20/2016; 1208 hrs.  Description of Procedure Process:   Position: Supine Target Area: Central-port of intrathecal pump. Approach: Anterior, 90 degree angle  approach. Area Prepped: Entire Area around the pump implant. Prepping solution: ChloraPrep (2% chlorhexidine gluconate and 70% isopropyl alcohol) Safety Precautions: Aspiration looking for blood return was conducted prior to all injections. At no point did we inject any substances, as a needle was being advanced. No attempts were made at seeking any paresthesias. Safe injection practices and needle disposal techniques used. Medications properly checked for expiration dates. SDV (single dose  vial) medications used. Description of the Procedure: Protocol guidelines were followed. Two nurses trained to do implant refills were present during the entire procedure. The refill medication was checked by both healthcare providers as well as the patient. The patient was included in the "Time-out" to verify the medication. The patient was placed in position. The pump was identified. The area was prepped in the usual manner. The sterile template was positioned over the pump, making sure the side-port location matched that of the pump. Both, the pump and the template were held for stability. The needle provided in the Medtronic Kit was then introduced thru the center of the template and into the central port. The pump content was aspirated and discarded volume documented. The new medication was slowly infused into the pump, thru the filter, making sure to avoid overpressure of the device. The needle was then removed and the area cleansed, making sure to leave some of the prepping solution back to take advantage of its long term bactericidal properties. The pump was interrogated and programmed to reflect the correct medication, volume, and dosage. The program was printed and taken to the physician for approval. Once checked and signed by the physician, a copy was provided to the patient and another scanned into the EMR. Vitals:   07/20/16 1131  BP: 133/68  Pulse: 72  Resp: 18  Temp: 98.6 F (37 C)  TempSrc: Oral    SpO2: 98%  Weight: 180 lb (81.6 kg)  Height: '5\' 2"'  (1.575 m)    Start Time: 1220 hrs. End Time: 1241 hrs. Materials & Medications: Medtronic Refill Kit Medication(s): Please see chart orders for details.  Imaging Guidance:  Type of Imaging Technique: None used Indication(s): N/A Exposure Time: No patient exposure Contrast: None used. Fluoroscopic Guidance: N/A Ultrasound Guidance: N/A Interpretation: N/A  Antibiotic Prophylaxis:  Indication(s): None identified Antibiotic given: None  Post-operative Assessment:  EBL: None Complications: No immediate post-treatment complications observed by team, or reported by patient. Note: The patient tolerated the entire procedure well. A repeat set of vitals were taken after the procedure and the patient was kept under observation following institutional policy, for this type of procedure. Post-procedural neurological assessment was performed, showing return to baseline, prior to discharge. The patient was provided with post-procedure discharge instructions, including a section on how to identify potential problems. Should any problems arise concerning this procedure, the patient was given instructions to immediately contact us, at any time, without hesitation. In any case, we plan to contact the patient by telephone for a follow-up status report regarding this interventional procedure. Comments:  No additional relevant information.  Plan of Care  Disposition: Discharge home  Discharge Date & Time: 07/20/2016;   hrs.  Physician-requested Follow-up:  Return for Pump Refill (as per pump program).  Future Appointments Date Time Provider Central  09/27/2016 8:30 AM Milinda Pointer, MD [facility] None   Medications ordered for procedure: No orders of the defined types were placed in this encounter.  Medications administered: Ms. Mcvicar had no medications administered during this visit.  See the medical record for exact dosing,  route, and time of administration.  Lab-work, Procedure(s), & Referral(s) Ordered: Orders Placed This Encounter  Procedures  . PUMP REFILL  . PUMP REFILL   Imaging Ordered: Results for orders placed during the hospital encounter of 04/15/04  DG C-Arm 1-60 Min-No Report   Narrative Clinical Data: Back pain.  FLUOROSCOPY:  Fluoroscopy was utilized by the requesting physician. No radiographic interpretation.    Provider:  Michele Rockers, Millicent Sloan   New Prescriptions   No medications on file   Primary Care Physician: Venetia Maxon, DO Location: [facility] Note by: Kathlen Brunswick. Dossie Arbour, M.D, DABA, DABAPM, DABPM, DABIPP, FIPP Date: 07/20/2016; Time: 2:27 PM  Disclaimer:  Medicine is not an Chief Strategy Officer. The only guarantee in medicine is that nothing is guaranteed. It is important to note that the decision to proceed with this intervention was based on the information collected from the patient. The Data and conclusions were drawn from the patient's questionnaire, the interview, and the physical examination. Because the information was provided in large part by the patient, it cannot be guaranteed that it has not been purposely or unconsciously manipulated. Every effort has been made to obtain as much relevant data as possible for this evaluation. It is important to note that the conclusions that lead to this procedure are derived in large part from the available data. Always take into account that the treatment will also be dependent on availability of resources and existing treatment guidelines, considered by other Pain Management Practitioners as being common knowledge and practice, at the time of the intervention. For Medico-Legal purposes, it is also important to point out that variation in procedural techniques and pharmacological choices are the acceptable norm. The indications, contraindications, technique, and results of the above procedure should  only be interpreted and judged by a Board-Certified Interventional Pain Specialist with extensive familiarity and expertise in the same exact procedure and technique. Attempts at providing opinions without similar or greater experience and expertise than that of the treating physician will be considered as inappropriate and unethical, and shall result in a formal complaint to the state medical board and applicable specialty societies.  Instructions provided at this appointment: Patient Instructions   Pain Score  Introduction: The pain score used by this practice is the Verbal Numerical Rating Scale (VNRS-11). This is an 11-point scale. It is for adults and children 10 years or older. There are significant differences in how the pain score is reported, used, and applied. Forget everything you learned in the past and learn this scoring system.  General Information: The scale should reflect your current level of pain. Unless you are specifically asked for the level of your worst pain, or your average pain. If you are asked for one of these two, then it should be understood that it is over the past 24 hours.  Basic Activities of Daily Living (ADL): Personal hygiene, dressing, eating, transferring, and using restroom.  Instructions: Most patients tend to report their level of pain as a combination of two factors, their physical pain and their psychosocial pain. This last one is also known as "suffering" and it is reflection of how physical pain affects you socially and psychologically. From now on, report them separately. From this point on, when asked to report your pain level, report only your physical pain. Use the following table for reference.  Pain Clinic Pain Levels (0-5/10)  Pain Level Score Description  No Pain 0   Mild pain 1 Nagging, annoying, but does not interfere with basic activities of daily living (ADL). Patients are able to eat, bathe, get dressed, toileting (being able to get on and  off the toilet and perform personal hygiene functions), transfer (move in and out of bed or a chair without assistance), and maintain continence (able to control bladder and bowel functions). Blood pressure and heart rate are unaffected. A normal heart rate for a healthy adult ranges  from 60 to 100 bpm (beats per minute).   Mild to moderate pain 2 Noticeable and distracting. Impossible to hide from other people. More frequent flare-ups. Still possible to adapt and function close to normal. It can be very annoying and may have occasional stronger flare-ups. With discipline, patients may get used to it and adapt.   Moderate pain 3 Interferes significantly with activities of daily living (ADL). It becomes difficult to feed, bathe, get dressed, get on and off the toilet or to perform personal hygiene functions. Difficult to get in and out of bed or a chair without assistance. Very distracting. With effort, it can be ignored when deeply involved in activities.   Moderately severe pain 4 Impossible to ignore for more than a few minutes. With effort, patients may still be able to manage work or participate in some social activities. Very difficult to concentrate. Signs of autonomic nervous system discharge are evident: dilated pupils (mydriasis); mild sweating (diaphoresis); sleep interference. Heart rate becomes elevated (>115 bpm). Diastolic blood pressure (lower number) rises above 100 mmHg. Patients find relief in laying down and not moving.   Severe pain 5 Intense and extremely unpleasant. Associated with frowning face and frequent crying. Pain overwhelms the senses.  Ability to do any activity or maintain social relationships becomes significantly limited. Conversation becomes difficult. Pacing back and forth is common, as getting into a comfortable position is nearly impossible. Pain wakes you up from deep sleep. Physical signs will be obvious: pupillary dilation; increased sweating; goosebumps; brisk  reflexes; cold, clammy hands and feet; nausea, vomiting or dry heaves; loss of appetite; significant sleep disturbance with inability to fall asleep or to remain asleep. When persistent, significant weight loss is observed due to the complete loss of appetite and sleep deprivation.  Blood pressure and heart rate becomes significantly elevated. Caution: If elevated blood pressure triggers a pounding headache associated with blurred vision, then the patient should immediately seek attention at an urgent or emergency care unit, as these may be signs of an impending stroke.    Emergency Department Pain Levels (6-10/10)  Emergency Room Pain 6 Severely limiting. Requires emergency care and should not be seen or managed at an outpatient pain management facility. Communication becomes difficult and requires great effort. Assistance to reach the emergency department may be required. Facial flushing and profuse sweating along with potentially dangerous increases in heart rate and blood pressure will be evident.   Distressing pain 7 Self-care is very difficult. Assistance is required to transport, or use restroom. Assistance to reach the emergency department will be required. Tasks requiring coordination, such as bathing and getting dressed become very difficult.   Disabling pain 8 Self-care is no longer possible. At this level, pain is disabling. The individual is unable to do even the most "basic" activities such as walking, eating, bathing, dressing, transferring to a bed, or toileting. Fine motor skills are lost. It is difficult to think clearly.   Incapacitating pain 9 Pain becomes incapacitating. Thought processing is no longer possible. Difficult to remember your own name. Control of movement and coordination are lost.   The worst pain imaginable 10 At this level, most patients pass out from pain. When this level is reached, collapse of the autonomic nervous system occurs, leading to a sudden drop in blood  pressure and heart rate. This in turn results in a temporary and dramatic drop in blood flow to the brain, leading to a loss of consciousness. Fainting is one of the body's self defense  mechanisms. Passing out puts the brain in a calmed state and causes it to shut down for a while, in order to begin the healing process.    Summary: 1. Refer to this scale when providing [place] with your pain level. 2. Be accurate and careful when reporting your pain level. This will help with your care. 3. Over-reporting your pain level will lead to loss of credibility. 4. Even a level of 1/10 means that there is pain and will be treated at our facility. 5. High, inaccurate reporting will be documented as "Symptom Exaggeration", leading to loss of credibility and suspicions of possible secondary gains such as obtaining more narcotics, or wanting to appear disabled, for fraudulent reasons. 6. Only pain levels of 5 or below will be seen at our facility. 7. Pain levels of 6 and above will be sent to the Emergency Department and the appointment cancelled. _____________________________________________________________________________________________   Opioid Overdose Opioids are substances that relieve pain by binding to pain receptors in your brain and spinal cord. Opioids include illegal drugs, such as heroin, as well as prescription pain medicines.An opioid overdose happens when you take too much of an opioid substance. This can happen with any type of opioid, including:  Heroin.  Morphine.  Codeine.  Methadone.  Oxycodone.  Hydrocodone.  Fentanyl.  Hydromorphone.  Buprenorphine. The effects of an overdose can be mild, dangerous, or even deadly. Opioid overdose is a medical emergency. What are the causes? This condition may be caused by:  Taking too much of an opioid by accident.  Taking too much of an opioid on purpose.  An error made by a health care provider who prescribes a medicine.  An  error made by the pharmacist who fills the prescription order.  Using more than one substance that contains opioids at the same time.  Mixing an opioid with a substance that affects your heart, breathing, or blood pressure. These include alcohol, tranquilizers, sleeping pills, illegal drugs, and some over-the-counter medicines. What increases the risk? This condition is more likely in:  Children. They may be attracted to colorful pills. Because of a child's small size, even a small amount of a drug can be dangerous.  Elderly people. They may be taking many different drugs. Elderly people may have difficulty reading labels or remembering when they last took their medicine.  People who take an opioid on a long-term basis.  People who use:  Illegal drugs.  Other substances, including alcohol, while using an opioid.  People who have:  A history of drug or alcohol abuse.  Certain mental health conditions.  People who take opioids that are not prescribed for them. What are the signs or symptoms? Symptoms of this condition depend on the type of opioid and the amount that was taken. Common symptoms include:  Sleepiness or difficulty waking from sleep.  Confusion.  Slurred speech.  Slowed breathing and a slow pulse.  Nausea and vomiting.  Abnormally small pupils. Signs and symptoms that require emergency treatment include:  Cold, clammy, and pale skin.  Blue lips and fingernails.  Vomiting.  Gurgling sounds in the throat.  A pulse that is very slow or difficult to detect.  Breathing that is very slow, noisy, or difficult to detect.  Limp body.  Inability to respond to speech or be awakened from sleep (stupor). How is this diagnosed? This condition is diagnosed based on your symptoms. It is important to tell your health care provider:  All of the opioidsthat you took.  When you  took the opioids.  Whether you were drinking alcohol or using other  substances. Your health care provider will do a physical exam. This exam may include:  Checking and monitoring your heart rate and rhythm, your breathing rate and depth, your temperature, and your blood pressure (vital signs).  Checking for abnormally small pupils.  Measuring oxygen levels in your blood. You may also have blood tests or urine tests. How is this treated? Supporting your vital signs and your breathing is the first step in treating an opioid overdose. Treatment may also include:  Giving fluids and minerals (electrolytes) through an IV tube.  Inserting a breathing tube (endotracheal tube) in your airway to help you breathe.  Giving oxygen.  Passing a tube through your nose and into your stomach (NG tube, or nasogastric tube) to wash out your stomach.  Giving medicines that:  Increase your blood pressure.  Absorb any opioid that is in your digestive system.  Reverse the effects of the opioid (naloxone).  Ongoing counseling and mental health support if you intentionally overdosed or used an illegal drug. Follow these instructions at home:  Take over-the-counter and prescription medicines only as told by your health care provider. Always ask your health care provider about possible side effects and interactions of any new medicine that you start taking.  Keep a list of all of the medicines that you take, including over-the-counter medicines. Bring this list with you to all of your medical visits.  Drink enough fluid to keep your urine clear or pale yellow.  Keep all follow-up visits as told by your health care provider. This is important. How is this prevented?  Get help if you are struggling with:  Alcohol or drug use.  Depression or another mental health problem.  Keep the phone number of your local poison control center near your phone or on your cell phone.  Store all medicines in safety containers that are out of the reach of children.  Read the drug  inserts that come with your medicines.  Do not drink alcohol when taking opioids.  Do not use illegal drugs.  Do not take opioid medicines that are not prescribed for you. Contact a health care provider if:  Your symptoms return.  You develop new symptoms or side effects when you are taking medicines. Get help right away if:  You think that you or someone else may have taken too much of an opioid. The hotline of the [facility] is 567-120-1354.  You or someone else is having symptoms of an opioid overdose.  You have serious thoughts about hurting yourself or others.  You have:  Chest pain.  Difficulty breathing.  A loss of consciousness. Opioid overdose is an emergency. Do not wait to see if the symptoms will go away. Get medical help right away. Call your local emergency services (911 in the [place] Do not drive yourself to the hospital. This information is not intended to replace advice given to you by your health care provider. Make sure you discuss any questions you have with your health care provider. Document Released: 06/09/2004 Document Revised: 10/08/2015 Document Reviewed: 10/16/2014 Elsevier Interactive Patient Education  2017 [employer].

## 2016-07-21 ENCOUNTER — Telehealth: Payer: Self-pay | Admitting: *Deleted

## 2016-07-21 MED FILL — Medication: INTRATHECAL | Qty: 1 | Status: AC

## 2016-07-21 NOTE — Telephone Encounter (Signed)
Left voicemail for patient to call for any problems post pump refill.

## 2016-09-12 ENCOUNTER — Other Ambulatory Visit: Payer: Self-pay

## 2016-09-12 MED ORDER — PAIN MANAGEMENT IT PUMP REFILL: 1.0000 | Freq: Once | INTRATHECAL | 0 refills | Status: DC

## 2016-09-27 ENCOUNTER — Ambulatory Visit: Payer: Medicare Other | Attending: Pain Medicine | Admitting: Pain Medicine

## 2016-09-27 ENCOUNTER — Encounter: Payer: Self-pay | Admitting: Pain Medicine

## 2016-09-27 VITALS — BP 133/65 | HR 74 | Temp 97.5°F | Resp 18 | Ht 62.0 in | Wt 180.0 lb

## 2016-09-27 DIAGNOSIS — Z888 Allergy status to other drugs, medicaments and biological substances status: Secondary | ICD-10-CM | POA: Insufficient documentation

## 2016-09-27 DIAGNOSIS — M79605 Pain in left leg: Secondary | ICD-10-CM | POA: Insufficient documentation

## 2016-09-27 DIAGNOSIS — Z9049 Acquired absence of other specified parts of digestive tract: Secondary | ICD-10-CM | POA: Insufficient documentation

## 2016-09-27 DIAGNOSIS — Z79891 Long term (current) use of opiate analgesic: Secondary | ICD-10-CM

## 2016-09-27 DIAGNOSIS — Z9841 Cataract extraction status, right eye: Secondary | ICD-10-CM | POA: Insufficient documentation

## 2016-09-27 DIAGNOSIS — Z9889 Other specified postprocedural states: Secondary | ICD-10-CM | POA: Insufficient documentation

## 2016-09-27 DIAGNOSIS — G8929 Other chronic pain: Secondary | ICD-10-CM

## 2016-09-27 DIAGNOSIS — M5442 Lumbago with sciatica, left side: Secondary | ICD-10-CM

## 2016-09-27 DIAGNOSIS — T402X5A Adverse effect of other opioids, initial encounter: Secondary | ICD-10-CM | POA: Diagnosis not present

## 2016-09-27 DIAGNOSIS — G894 Chronic pain syndrome: Secondary | ICD-10-CM

## 2016-09-27 DIAGNOSIS — M961 Postlaminectomy syndrome, not elsewhere classified: Secondary | ICD-10-CM

## 2016-09-27 DIAGNOSIS — K5900 Constipation, unspecified: Secondary | ICD-10-CM | POA: Insufficient documentation

## 2016-09-27 DIAGNOSIS — M533 Sacrococcygeal disorders, not elsewhere classified: Secondary | ICD-10-CM | POA: Insufficient documentation

## 2016-09-27 DIAGNOSIS — K5903 Drug induced constipation: Secondary | ICD-10-CM

## 2016-09-27 MED ORDER — LUBIPROSTONE 8 MCG PO CAPS: 8.0000 ug | ORAL_CAPSULE | Freq: Two times a day (BID) | ORAL | 0 refills | Status: DC

## 2016-09-27 NOTE — Patient Instructions (Signed)
Pain Score  Introduction: The pain score used by this practice is the Verbal Numerical Rating Scale (VNRS-11). This is an 11-point scale. It is for adults and children 10 years or older. There are significant differences in how the pain score is reported, used, and applied. Forget everything you learned in the past and learn this scoring system.  General Information: The scale should reflect your current level of pain. Unless you are specifically asked for the level of your worst pain, or your average pain. If you are asked for one of these two, then it should be understood that it is over the past 24 hours.  Basic Activities of Daily Living (ADL): Personal hygiene, dressing, eating, transferring, and using restroom.  Instructions: Most patients tend to report their level of pain as a combination of two factors, their physical pain and their psychosocial pain. This last one is also known as "suffering" and it is reflection of how physical pain affects you socially and psychologically. From now on, report them separately. From this point on, when asked to report your pain level, report only your physical pain. Use the following table for reference.  Pain Clinic Pain Levels (0-5/10)  Pain Level Score Description  No Pain 0   Mild pain 1 Nagging, annoying, but does not interfere with basic activities of daily living (ADL). Patients are able to eat, bathe, get dressed, toileting (being able to get on and off the toilet and perform personal hygiene functions), transfer (move in and out of bed or a chair without assistance), and maintain continence (able to control bladder and bowel functions). Blood pressure and heart rate are unaffected. A normal heart rate for a healthy adult ranges from 60 to 100 bpm (beats per minute).   Mild to moderate pain 2 Noticeable and distracting. Impossible to hide from other people. More frequent flare-ups. Still possible to adapt and function close to normal. It can be very  annoying and may have occasional stronger flare-ups. With discipline, patients may get used to it and adapt.   Moderate pain 3 Interferes significantly with activities of daily living (ADL). It becomes difficult to feed, bathe, get dressed, get on and off the toilet or to perform personal hygiene functions. Difficult to get in and out of bed or a chair without assistance. Very distracting. With effort, it can be ignored when deeply involved in activities.   Moderately severe pain 4 Impossible to ignore for more than a few minutes. With effort, patients may still be able to manage work or participate in some social activities. Very difficult to concentrate. Signs of autonomic nervous system discharge are evident: dilated pupils (mydriasis); mild sweating (diaphoresis); sleep interference. Heart rate becomes elevated (>115 bpm). Diastolic blood pressure (lower number) rises above 100 mmHg. Patients find relief in laying down and not moving.   Severe pain 5 Intense and extremely unpleasant. Associated with frowning face and frequent crying. Pain overwhelms the senses.  Ability to do any activity or maintain social relationships becomes significantly limited. Conversation becomes difficult. Pacing back and forth is common, as getting into a comfortable position is nearly impossible. Pain wakes you up from deep sleep. Physical signs will be obvious: pupillary dilation; increased sweating; goosebumps; brisk reflexes; cold, clammy hands and feet; nausea, vomiting or dry heaves; loss of appetite; significant sleep disturbance with inability to fall asleep or to remain asleep. When persistent, significant weight loss is observed due to the complete loss of appetite and sleep deprivation.  Blood pressure and heart   rate becomes significantly elevated. Caution: If elevated blood pressure triggers a pounding headache associated with blurred vision, then the patient should immediately seek attention at an urgent or  emergency care unit, as these may be signs of an impending stroke.    Emergency Department Pain Levels (6-10/10)  Emergency Room Pain 6 Severely limiting. Requires emergency care and should not be seen or managed at an outpatient pain management facility. Communication becomes difficult and requires great effort. Assistance to reach the emergency department may be required. Facial flushing and profuse sweating along with potentially dangerous increases in heart rate and blood pressure will be evident.   Distressing pain 7 Self-care is very difficult. Assistance is required to transport, or use restroom. Assistance to reach the emergency department will be required. Tasks requiring coordination, such as bathing and getting dressed become very difficult.   Disabling pain 8 Self-care is no longer possible. At this level, pain is disabling. The individual is unable to do even the most "basic" activities such as walking, eating, bathing, dressing, transferring to a bed, or toileting. Fine motor skills are lost. It is difficult to think clearly.   Incapacitating pain 9 Pain becomes incapacitating. Thought processing is no longer possible. Difficult to remember your own name. Control of movement and coordination are lost.   The worst pain imaginable 10 At this level, most patients pass out from pain. When this level is reached, collapse of the autonomic nervous system occurs, leading to a sudden drop in blood pressure and heart rate. This in turn results in a temporary and dramatic drop in blood flow to the brain, leading to a loss of consciousness. Fainting is one of the body's self defense mechanisms. Passing out puts the brain in a calmed state and causes it to shut down for a while, in order to begin the healing process.    Summary: 1. Refer to this scale when providing [place] with your pain level. 2. Be accurate and careful when reporting your pain level. This will help with your care. 3. Over-reporting  your pain level will lead to loss of credibility. 4. Even a level of 1/10 means that there is pain and will be treated at our facility. 5. High, inaccurate reporting will be documented as "Symptom Exaggeration", leading to loss of credibility and suspicions of possible secondary gains such as obtaining more narcotics, or wanting to appear disabled, for fraudulent reasons. 6. Only pain levels of 5 or below will be seen at our facility. 7. Pain levels of 6 and above will be sent to the Emergency Department and the appointment cancelled. _____________________________________________________________________________________________  ____________________________________________________________________________________________  Medication Rules  Applies to: All patients receiving prescriptions (written or electronic).  Pharmacy of record: Pharmacy where electronic prescriptions will be sent. If written prescriptions are taken to a different pharmacy, please inform the nursing staff. The pharmacy listed in the electronic medical record should be the one where you would like electronic prescriptions to be sent.  Prescription refills: Only during scheduled appointments. Applies to both, written and electronic prescriptions.  NOTE: The following applies primarily to controlled substances (Opioid Pain Medications)  Patient's responsibilities: 1. Pain Pills: Bring all pain pills to every appointment (except for procedure appointments). 2. Pill Bottles: Bring pills in original pharmacy bottle. Always bring newest bottle. Bring bottle, even if empty. 3. Medication refills: You are responsible for knowing and keeping track of what medications you need refilled. The day before your appointment, write a list of all prescriptions that need to be  refilled. Bring that list to your appointment and give it to the admitting nurse. Prescriptions will be written only during appointments. If you forget a medication, it  will not be "Called in", "Faxed", or "electronically sent". You will need to get another appointment to get these prescribed. 4. Prescription Accuracy: You are responsible for carefully inspecting your prescriptions before leaving our office. Have the discharge nurse carefully go over each prescription with you, before taking them home. Make sure that your name is accurately spelled, that your address is correct. Check the name and dose of your medication to make sure it is accurate. Check the number of pills, and the written instructions to make sure they are clear and accurate. Make sure that you are given enough medication to last until your next medication refill appointment. 5. Taking Medication: Take medication as prescribed. Never take more pills than instructed. Never take medication more frequently than prescribed. Taking less pills or less frequently is permitted and encouraged, when it comes to controlled substances (written prescriptions).  6. Inform other Doctors: Always inform, all of your healthcare providers, of all the medications you take. 7. Pain Medication from other Providers: You are not allowed to accept any additional pain medication from any other Doctor or Healthcare provider. There are two exceptions to this rule. (see below) In the event that you require additional pain medication, you are responsible for notifying us, as stated below. 8. Medication Agreement: You are responsible for carefully reading and following our Medication Agreement. This must be signed before receiving any prescriptions from our practice. Safely store a copy of your signed Agreement. Violations to the Agreement will result in no further prescriptions. (Additional copies of our Medication Agreement are available upon request.) 9. Laws, Rules, & Regulations: All patients are expected to follow all Federal and [employer], Rules, [employer]. Ignorance of the Laws does not constitute a valid  excuse.  Exceptions: There are only two exceptions to the rule of not receiving pain medications from other Healthcare Providers. 1. Exception #1 (Emergencies): In the event of an emergency (i.e.: accident requiring emergency care), you are allowed to receive additional pain medication. However, you are responsible for: As soon as you are able, call our office [phone], at any time of the day or night, and leave a message stating your name, the date and nature of the emergency, and the name and dose of the medication prescribed. In the event that your call is answered by a member of our staff, make sure to document and save the date, time, and the name of the person that took your information.  2. Exception #2 (Planned Surgery): In the event that you are scheduled by another doctor or dentist to have any type of surgery or procedure, you are allowed (for a period no longer than 30 days), to receive additional pain medication, for the acute post-op pain. However, in this case, you are responsible for picking up a copy of our "Post-op Pain Management for Surgeons" handout, and giving it to your surgeon or dentist. This document is available at our office, and does not require an appointment to obtain it. Simply go to our office during business hours (Monday-Thursday from 8:00 AM to 4:00 PM) (Friday 8:00 AM to 12:00 Noon) or if you have a scheduled appointment with [place], prior to your surgery, and ask for it by name. In addition, you will need to provide [place] with your name, name of your surgeon, type of surgery, and  date of procedure or surgery.  _____________________________________________________________________________________________ 

## 2016-09-27 NOTE — Progress Notes (Signed)
Safety precautions to be maintained throughout the outpatient stay will include: orient to surroundings, keep bed in low position, maintain call bell within reach at all times, provide assistance with transfer out of bed and ambulation.  

## 2016-09-27 NOTE — Progress Notes (Signed)
Patient's Name: Tiffany Hunter  MRN: [id number]  Referring Provider: Venetia Maxon, DO  DOB: 1946-01-10  PCP: Venetia Maxon, DO  DOS: 09/27/2016  Note by: Kathlen Brunswick. Dossie Arbour, MD  Service setting: Ambulatory outpatient  Location: [facility] (AMB) Pain Management Facility  Visit type: Procedure  Specialty: Interventional Pain Management  Patient type: Established   Primary Reason for Visit: Interventional Pain Management Treatment. CC: Knee Pain (bilateral)  Procedure:  Intrathecal Drug Delivery System (IDDS):  Type: Reservoir Refill [phone]) No rate change Region: Abdominal Laterality: Left  Type of Pump: Medtronic Synchromed II (MRI-compatible) Delivery Route: Intrathecal Type of Pain Treated: Neuropathic/Nociceptive Primary Medication Class: Opioid/opiate  Medication, Concentration, Infusion Program, & Delivery Rate: Please see scanned programming printout.  Indications: 1. Chronic lower extremity pain (Location of Primary Source of Pain) (Left)   2. Chronic low back pain (Location of Secondary source of pain) (Left)   3. Chronic sacroiliac joint pain (Location of Tertiary source of pain) (Left)   4. Chronic pain syndrome   5. Failed back surgical syndrome   6. Long term current use of opiate analgesic   7. Opioid-induced constipation (OIC)    Pain Assessment: Self-Reported Pain Score: 5 /10             Reported level is compatible with observation.        Intrathecal Pump Therapy Assessment  Manufacturer: Medtronic Synchromed II Type: Programmable Volume: 40 mL reservoir MRI compatibility: Yes   Drug content:  Primary Medication Class: Opioid Primary Medication: PF-Hydromorphone (Dilaudid) Secondary Medication: PF-Sufentanil Other Medication: see pump readout   Programming:  Type: Simple continuous. See pump readout for details.   Changes:  Medication Change: None at this point Rate Change: No change in rate  Reported side-effects or adverse reactions: None  reported  Effectiveness: Described as relatively effective, allowing for increase in activities of daily living (ADL) Clinically meaningful improvement in function (CMIF): Sustained CMIF goals met  Plan: Pump refill today  Pre-op Assessment:  Previous date of service: 07/20/16 Service provided: Procedure (pump refill) Tiffany Hunter is a 71 y.o. (year old), female patient, seen today for interventional treatment. She  has a past surgical history that includes Carpal tunnel release; Back surgery; Breast surgery (Bilateral); Cholecystectomy; Colonoscopy w/ polypectomy; Cataract extraction w/ intraocular lens implant (Right); Fracture surgery (Right); and Abdominal hysterectomy. Her primarily concern today is the Knee Pain (bilateral)  Initial Vital Signs: Blood pressure 133/65, pulse 74, temperature 97.5 F (36.4 C), resp. rate 18, height _0  (1.575 m), weight 180 lb (81.6 kg), SpO2 98 %. BMI: 32.92 kg/m  Risk Assessment: Allergies: Reviewed. She is allergic to doxycycline hyclate; sulfa antibiotics; and sulfamethoxazole-trimethoprim.  Allergy Precautions: None required Coagulopathies: Reviewed. None identified.  Blood-thinner therapy: None at this time Active Infection(s): Reviewed. None identified. Tiffany Hunter is afebrile  Site Confirmation: Tiffany Hunter was asked to confirm the procedure and laterality before marking the site Procedure checklist: Completed Consent: Before the procedure and under the influence of no sedative(s), amnesic(s), or anxiolytics, the patient was informed of the treatment options, risks and possible complications. To fulfill our ethical and legal obligations, as recommended by the American Medical Association's Code of Ethics, I have informed the patient of my clinical impression; the nature and purpose of the treatment or procedure; the risks, benefits, and possible complications of the intervention; the alternatives, including doing nothing; the risk(s) and  benefit(s) of the alternative treatment(s) or procedure(s); and the risk(s) and benefit(s) of doing nothing.  Tiffany Hunter was  provided with information about the general risks and possible complications associated with most interventional procedures. These include, but are not limited to: failure to achieve desired goals, infection, bleeding, organ or nerve damage, allergic reactions, paralysis, and/or death.  In addition, she was informed of those risks and possible complications associated to this particular procedure, which include, but are not limited to: damage to the implant; failure to decrease pain; local, systemic, or serious CNS infections, intraspinal abscess with possible cord compression and paralysis, or life-threatening such as meningitis; bleeding; organ damage; nerve injury or damage with subsequent sensory, motor, and/or autonomic system dysfunction, resulting in transient or permanent pain, numbness, and/or weakness of one or several areas of the body; allergic reactions, either minor or major life-threatening, such as anaphylactic or anaphylactoid reactions.  Furthermore, Tiffany Hunter was informed of those risks and complications associated with the medications. These include, but are not limited to: allergic reactions (i.e.: anaphylactic or anaphylactoid reactions); endorphine suppression; bradycardia and/or hypotension; water retention and/or peripheral vascular relaxation leading to lower extremity edema and possible stasis ulcers; respiratory depression and/or shortness of breath; decreased metabolic rate leading to weight gain; swelling or edema; medication-induced neural toxicity; particulate matter embolism and blood vessel occlusion with resultant organ, and/or nervous system infarction; and/or intrathecal granuloma formation with possible spinal cord compression and permanent paralysis.  Before refilling the pump Tiffany Hunter was informed that some of the medications used in the  devise may not be FDA approved for such use and therefore it constitutes an off-label use of the medications.  Finally, she was informed that Medicine is not an exact science; therefore, there is also the possibility of unforeseen or unpredictable risks and/or possible complications that may result in a catastrophic outcome. The patient indicated having understood very clearly. We have given the patient no guarantees and we have made no promises. Enough time was given to the patient to ask questions, all of which were answered to the patient's satisfaction. Tiffany Hunter has indicated that she wanted to continue with the procedure. Attestation: I, the ordering provider, attest that I have discussed with the patient the benefits, risks, side-effects, alternatives, likelihood of achieving goals, and potential problems during recovery for the procedure that I have provided informed consent. Date: 09/27/2016; Time: 8:48 AM  Pre-Procedure Preparation:  Monitoring: As per clinic protocol. Respiration, ETCO2, SpO2, BP, heart rate and rhythm monitor placed and checked for adequate function Safety Precautions: Patient was assessed for positional comfort and pressure points before starting the procedure. Time-out: I initiated and conducted the "Time-out" before starting the procedure, as per protocol. The patient was asked to participate by confirming the accuracy of the "Time Out" information. Verification of the correct person, site, and procedure were performed and confirmed by me, the nursing staff, and the patient. "Time-out" conducted as per Joint Commission's Universal Protocol (UP.01.01.01). "Time-out" Date & Time: 09/27/2016; 0829 hrs.  Description of Procedure Process:   Position: Supine Target Area: Central-port of intrathecal pump. Approach: Anterior, 90 degree angle approach. Area Prepped: Entire Area around the pump implant. Prepping solution: ChloraPrep (2% chlorhexidine gluconate and 70%  isopropyl alcohol) Safety Precautions: Aspiration looking for blood return was conducted prior to all injections. At no point did we inject any substances, as a needle was being advanced. No attempts were made at seeking any paresthesias. Safe injection practices and needle disposal techniques used. Medications properly checked for expiration dates. SDV (single dose vial) medications used. Description of the Procedure: Protocol guidelines were followed. Two nurses trained  to do implant refills were present during the entire procedure. The refill medication was checked by both healthcare providers as well as the patient. The patient was included in the "Time-out" to verify the medication. The patient was placed in position. The pump was identified. The area was prepped in the usual manner. The sterile template was positioned over the pump, making sure the side-port location matched that of the pump. Both, the pump and the template were held for stability. The needle provided in the Medtronic Kit was then introduced thru the center of the template and into the central port. The pump content was aspirated and discarded volume documented. The new medication was slowly infused into the pump, thru the filter, making sure to avoid overpressure of the device. The needle was then removed and the area cleansed, making sure to leave some of the prepping solution back to take advantage of its long term bactericidal properties. The pump was interrogated and programmed to reflect the correct medication, volume, and dosage. The program was printed and taken to the physician for approval. Once checked and signed by the physician, a copy was provided to the patient and another scanned into the EMR. Vitals:   09/27/16 0811 09/27/16 0813  BP:  133/65  Pulse: 74   Resp: 18   Temp: 97.5 F (36.4 C)   SpO2: 98%   Weight: 180 lb (81.6 kg)   Height: _0  (1.575 m)     Start Time: 0830 hrs. End Time:   hrs. Materials &  Medications: Medtronic Refill Kit Medication(s): Please see chart orders for details.  Imaging Guidance:  Type of Imaging Technique: None used Indication(s): N/A Exposure Time: No patient exposure Contrast: None used. Fluoroscopic Guidance: N/A Ultrasound Guidance: N/A Interpretation: N/A  Antibiotic Prophylaxis:  Indication(s): None identified Antibiotic given: None  Post-operative Assessment:  EBL: None Complications: No immediate post-treatment complications observed by team, or reported by patient. Note: The patient tolerated the entire procedure well. A repeat set of vitals were taken after the procedure and the patient was kept under observation following institutional policy, for this type of procedure. Post-procedural neurological assessment was performed, showing return to baseline, prior to discharge. The patient was provided with post-procedure discharge instructions, including a section on how to identify potential problems. Should any problems arise concerning this procedure, the patient was given instructions to immediately contact us, at any time, without hesitation. In any case, we plan to contact the patient by telephone for a follow-up status report regarding this interventional procedure. Comments:  No additional relevant information.  Plan of Care  Disposition: Discharge home  Discharge Date & Time: 09/27/2016; 0900 hrs.  Physician-requested Follow-up:  Return for Pump Refill (as per pump program), w/ MD.  Future Appointments Date Time Provider [place]  12/07/2016 11:00 AM Milinda Pointer, MD [facility] None   Medications ordered for procedure: Meds ordered this encounter  Medications  . lubiprostone (AMITIZA) 8 MCG capsule    Sig: Take 1 capsule (8 mcg total) by mouth 2 (two) times daily with a meal. Swallow the medication whole. Do not break or chew the medication.    Dispense:  180 capsule    Refill:  0    Do not place this medication, or any  other prescription from our practice, on "Automatic Refill". Patient may have prescription filled one day early if pharmacy is closed on scheduled refill date.   Medications administered: Tiffany Hunter had no medications administered during this visit.  See the medical  record for exact dosing, route, and time of administration.  Lab-work, Procedure(s), & Referral(s) Ordered: Orders Placed This Encounter  Procedures  . PUMP REFILL  . PUMP REFILL  . Informed Consent Details: Transcribe to consent form and obtain patient signature  . Provider attestation of informed consent for procedure/surgical case  . Verify informed consent  . Discharge instructions  . Follow-up   Imaging Ordered: Results for orders placed during the hospital encounter of 04/15/04  DG C-Arm 1-60 Min-No Report   Narrative Clinical Data: Back pain.  FLUOROSCOPY:  Fluoroscopy was utilized by the requesting physician. No radiographic interpretation.    Provider: Michele Rockers, Millicent Sloan   New Prescriptions   LUBIPROSTONE (AMITIZA) 8 MCG CAPSULE    Take 1 capsule (8 mcg total) by mouth 2 (two) times daily with a meal. Swallow the medication whole. Do not break or chew the medication.   Primary Care Physician: Venetia Maxon, DO Location: [facility] Note by: Kathlen Brunswick. Dossie Arbour, M.D, DABA, DABAPM, DABPM, DABIPP, FIPP Date: 09/27/2016; Time: 4:21 PM  Disclaimer:  Medicine is not an Chief Strategy Officer. The only guarantee in medicine is that nothing is guaranteed. It is important to note that the decision to proceed with this intervention was based on the information collected from the patient. The Data and conclusions were drawn from the patient's questionnaire, the interview, and the physical examination. Because the information was provided in large part by the patient, it cannot be guaranteed that it has not been purposely or unconsciously manipulated. Every effort has been made to  obtain as much relevant data as possible for this evaluation. It is important to note that the conclusions that lead to this procedure are derived in large part from the available data. Always take into account that the treatment will also be dependent on availability of resources and existing treatment guidelines, considered by other Pain Management Practitioners as being common knowledge and practice, at the time of the intervention. For Medico-Legal purposes, it is also important to point out that variation in procedural techniques and pharmacological choices are the acceptable norm. The indications, contraindications, technique, and results of the above procedure should only be interpreted and judged by a Board-Certified Interventional Pain Specialist with extensive familiarity and expertise in the same exact procedure and technique.  Instructions provided at this appointment: Patient Instructions   Pain Score  Introduction: The pain score used by this practice is the Verbal Numerical Rating Scale (VNRS-11). This is an 11-point scale. It is for adults and children 10 years or older. There are significant differences in how the pain score is reported, used, and applied. Forget everything you learned in the past and learn this scoring system.  General Information: The scale should reflect your current level of pain. Unless you are specifically asked for the level of your worst pain, or your average pain. If you are asked for one of these two, then it should be understood that it is over the past 24 hours.  Basic Activities of Daily Living (ADL): Personal hygiene, dressing, eating, transferring, and using restroom.  Instructions: Most patients tend to report their level of pain as a combination of two factors, their physical pain and their psychosocial pain. This last one is also known as "suffering" and it is reflection of how physical pain affects you socially and psychologically. From now on, report  them separately. From this point on, when asked to report your pain level, report only your physical pain. Use the  following table for reference.  Pain Clinic Pain Levels (0-5/10)  Pain Level Score Description  No Pain 0   Mild pain 1 Nagging, annoying, but does not interfere with basic activities of daily living (ADL). Patients are able to eat, bathe, get dressed, toileting (being able to get on and off the toilet and perform personal hygiene functions), transfer (move in and out of bed or a chair without assistance), and maintain continence (able to control bladder and bowel functions). Blood pressure and heart rate are unaffected. A normal heart rate for a healthy adult ranges from 60 to 100 bpm (beats per minute).   Mild to moderate pain 2 Noticeable and distracting. Impossible to hide from other people. More frequent flare-ups. Still possible to adapt and function close to normal. It can be very annoying and may have occasional stronger flare-ups. With discipline, patients may get used to it and adapt.   Moderate pain 3 Interferes significantly with activities of daily living (ADL). It becomes difficult to feed, bathe, get dressed, get on and off the toilet or to perform personal hygiene functions. Difficult to get in and out of bed or a chair without assistance. Very distracting. With effort, it can be ignored when deeply involved in activities.   Moderately severe pain 4 Impossible to ignore for more than a few minutes. With effort, patients may still be able to manage work or participate in some social activities. Very difficult to concentrate. Signs of autonomic nervous system discharge are evident: dilated pupils (mydriasis); mild sweating (diaphoresis); sleep interference. Heart rate becomes elevated (>115 bpm). Diastolic blood pressure (lower number) rises above 100 mmHg. Patients find relief in laying down and not moving.   Severe pain 5 Intense and extremely unpleasant. Associated with  frowning face and frequent crying. Pain overwhelms the senses.  Ability to do any activity or maintain social relationships becomes significantly limited. Conversation becomes difficult. Pacing back and forth is common, as getting into a comfortable position is nearly impossible. Pain wakes you up from deep sleep. Physical signs will be obvious: pupillary dilation; increased sweating; goosebumps; brisk reflexes; cold, clammy hands and feet; nausea, vomiting or dry heaves; loss of appetite; significant sleep disturbance with inability to fall asleep or to remain asleep. When persistent, significant weight loss is observed due to the complete loss of appetite and sleep deprivation.  Blood pressure and heart rate becomes significantly elevated. Caution: If elevated blood pressure triggers a pounding headache associated with blurred vision, then the patient should immediately seek attention at an urgent or emergency care unit, as these may be signs of an impending stroke.    Emergency Department Pain Levels (6-10/10)  Emergency Room Pain 6 Severely limiting. Requires emergency care and should not be seen or managed at an outpatient pain management facility. Communication becomes difficult and requires great effort. Assistance to reach the emergency department may be required. Facial flushing and profuse sweating along with potentially dangerous increases in heart rate and blood pressure will be evident.   Distressing pain 7 Self-care is very difficult. Assistance is required to transport, or use restroom. Assistance to reach the emergency department will be required. Tasks requiring coordination, such as bathing and getting dressed become very difficult.   Disabling pain 8 Self-care is no longer possible. At this level, pain is disabling. The individual is unable to do even the most "basic" activities such as walking, eating, bathing, dressing, transferring to a bed, or toileting. Fine motor skills are lost. It  is difficult to  think clearly.   Incapacitating pain 9 Pain becomes incapacitating. Thought processing is no longer possible. Difficult to remember your own name. Control of movement and coordination are lost.   The worst pain imaginable 10 At this level, most patients pass out from pain. When this level is reached, collapse of the autonomic nervous system occurs, leading to a sudden drop in blood pressure and heart rate. This in turn results in a temporary and dramatic drop in blood flow to the brain, leading to a loss of consciousness. Fainting is one of the body's self defense mechanisms. Passing out puts the brain in a calmed state and causes it to shut down for a while, in order to begin the healing process.    Summary: 1. Refer to this scale when providing [place] with your pain level. 2. Be accurate and careful when reporting your pain level. This will help with your care. 3. Over-reporting your pain level will lead to loss of credibility. 4. Even a level of 1/10 means that there is pain and will be treated at our facility. 5. High, inaccurate reporting will be documented as "Symptom Exaggeration", leading to loss of credibility and suspicions of possible secondary gains such as obtaining more narcotics, or wanting to appear disabled, for fraudulent reasons. 6. Only pain levels of 5 or below will be seen at our facility. 7. Pain levels of 6 and above will be sent to the Emergency Department and the appointment cancelled. _____________________________________________________________________________________________  ____________________________________________________________________________________________  Medication Rules  Applies to: All patients receiving prescriptions (written or electronic).  Pharmacy of record: Pharmacy where electronic prescriptions will be sent. If written prescriptions are taken to a different pharmacy, please inform the nursing staff. The pharmacy listed in the  electronic medical record should be the one where you would like electronic prescriptions to be sent.  Prescription refills: Only during scheduled appointments. Applies to both, written and electronic prescriptions.  NOTE: The following applies primarily to controlled substances (Opioid Pain Medications)  Patient's responsibilities: 1. Pain Pills: Bring all pain pills to every appointment (except for procedure appointments). 2. Pill Bottles: Bring pills in original pharmacy bottle. Always bring newest bottle. Bring bottle, even if empty. 3. Medication refills: You are responsible for knowing and keeping track of what medications you need refilled. The day before your appointment, write a list of all prescriptions that need to be refilled. Bring that list to your appointment and give it to the admitting nurse. Prescriptions will be written only during appointments. If you forget a medication, it will not be "Called in", "Faxed", or "electronically sent". You will need to get another appointment to get these prescribed. 4. Prescription Accuracy: You are responsible for carefully inspecting your prescriptions before leaving our office. Have the discharge nurse carefully go over each prescription with you, before taking them home. Make sure that your name is accurately spelled, that your address is correct. Check the name and dose of your medication to make sure it is accurate. Check the number of pills, and the written instructions to make sure they are clear and accurate. Make sure that you are given enough medication to last until your next medication refill appointment. 5. Taking Medication: Take medication as prescribed. Never take more pills than instructed. Never take medication more frequently than prescribed. Taking less pills or less frequently is permitted and encouraged, when it comes to controlled substances (written prescriptions).  6. Inform other Doctors: Always inform, all of your healthcare  providers, of all the medications you  take. 7. Pain Medication from other Providers: You are not allowed to accept any additional pain medication from any other Doctor or Healthcare provider. There are two exceptions to this rule. (see below) In the event that you require additional pain medication, you are responsible for notifying us, as stated below. 8. Medication Agreement: You are responsible for carefully reading and following our Medication Agreement. This must be signed before receiving any prescriptions from our practice. Safely store a copy of your signed Agreement. Violations to the Agreement will result in no further prescriptions. (Additional copies of our Medication Agreement are available upon request.) 9. Laws, Rules, & Regulations: All patients are expected to follow all Federal and [employer], Rules, [employer]. Ignorance of the Laws does not constitute a valid excuse.  Exceptions: There are only two exceptions to the rule of not receiving pain medications from other Healthcare Providers. 1. Exception #1 (Emergencies): In the event of an emergency (i.e.: accident requiring emergency care), you are allowed to receive additional pain medication. However, you are responsible for: As soon as you are able, call our office [phone], at any time of the day or night, and leave a message stating your name, the date and nature of the emergency, and the name and dose of the medication prescribed. In the event that your call is answered by a member of our staff, make sure to document and save the date, time, and the name of the person that took your information.  2. Exception #2 (Planned Surgery): In the event that you are scheduled by another doctor or dentist to have any type of surgery or procedure, you are allowed (for a period no longer than 30 days), to receive additional pain medication, for the acute post-op pain. However, in this case, you are responsible for picking up a  copy of our "Post-op Pain Management for Surgeons" handout, and giving it to your surgeon or dentist. This document is available at our office, and does not require an appointment to obtain it. Simply go to our office during business hours (Monday-Thursday from 8:00 AM to 4:00 PM) (Friday 8:00 AM to 12:00 Noon) or if you have a scheduled appointment with [place], prior to your surgery, and ask for it by name. In addition, you will need to provide [place] with your name, name of your surgeon, type of surgery, and date of procedure or surgery.  _____________________________________________________________________________________________

## 2016-09-28 ENCOUNTER — Telehealth: Payer: Self-pay

## 2016-09-28 NOTE — Telephone Encounter (Signed)
Post procedure phone call. Patient states she is doing good.  

## 2016-09-29 MED FILL — Medication: INTRATHECAL | Qty: 1 | Status: AC

## 2016-10-11 NOTE — Telephone Encounter (Signed)
na

## 2016-11-15 ENCOUNTER — Other Ambulatory Visit: Payer: Self-pay

## 2016-11-15 MED ORDER — PAIN MANAGEMENT IT PUMP REFILL: 1.0000 | Freq: Once | INTRATHECAL | 0 refills | Status: DC

## 2016-12-07 ENCOUNTER — Ambulatory Visit: Payer: Medicare Other | Attending: Pain Medicine | Admitting: Pain Medicine

## 2016-12-07 ENCOUNTER — Ambulatory Visit
Admission: RE | Admit: 2016-12-07 | Discharge: 2016-12-07 | Disposition: A | Discharge status: Home / Self Care | Payer: Medicare Other | Source: Ambulatory Visit | Attending: Pain Medicine | Admitting: Pain Medicine

## 2016-12-07 ENCOUNTER — Encounter: Payer: Self-pay | Admitting: Pain Medicine

## 2016-12-07 VITALS — BP 140/59 | HR 73 | Temp 98.1°F | Resp 18 | Ht 62.0 in | Wt 180.0 lb

## 2016-12-07 DIAGNOSIS — M79605 Pain in left leg: Secondary | ICD-10-CM

## 2016-12-07 DIAGNOSIS — Z9049 Acquired absence of other specified parts of digestive tract: Secondary | ICD-10-CM | POA: Diagnosis not present

## 2016-12-07 DIAGNOSIS — M5442 Lumbago with sciatica, left side: Secondary | ICD-10-CM

## 2016-12-07 DIAGNOSIS — M961 Postlaminectomy syndrome, not elsewhere classified: Secondary | ICD-10-CM | POA: Diagnosis not present

## 2016-12-07 DIAGNOSIS — Z978 Presence of other specified devices: Secondary | ICD-10-CM

## 2016-12-07 DIAGNOSIS — G8929 Other chronic pain: Secondary | ICD-10-CM | POA: Diagnosis not present

## 2016-12-07 DIAGNOSIS — M5136 Other intervertebral disc degeneration, lumbar region: Secondary | ICD-10-CM | POA: Diagnosis not present

## 2016-12-07 DIAGNOSIS — F1190 Opioid use, unspecified, uncomplicated: Secondary | ICD-10-CM

## 2016-12-07 DIAGNOSIS — Z4682 Encounter for fitting and adjustment of non-vascular catheter: Secondary | ICD-10-CM | POA: Diagnosis not present

## 2016-12-07 DIAGNOSIS — R079 Chest pain, unspecified: Secondary | ICD-10-CM | POA: Insufficient documentation

## 2016-12-07 DIAGNOSIS — Z9689 Presence of other specified functional implants: Secondary | ICD-10-CM

## 2016-12-07 DIAGNOSIS — Z451 Encounter for adjustment and management of infusion pump: Secondary | ICD-10-CM

## 2016-12-07 DIAGNOSIS — Z79891 Long term (current) use of opiate analgesic: Secondary | ICD-10-CM | POA: Insufficient documentation

## 2016-12-07 DIAGNOSIS — M533 Sacrococcygeal disorders, not elsewhere classified: Secondary | ICD-10-CM | POA: Insufficient documentation

## 2016-12-07 DIAGNOSIS — G894 Chronic pain syndrome: Secondary | ICD-10-CM

## 2016-12-07 DIAGNOSIS — F329 Major depressive disorder, single episode, unspecified: Secondary | ICD-10-CM | POA: Diagnosis not present

## 2016-12-07 DIAGNOSIS — M545 Low back pain: Secondary | ICD-10-CM | POA: Insufficient documentation

## 2016-12-07 NOTE — Progress Notes (Signed)
Safety precautions to be maintained throughout the outpatient stay will include: orient to surroundings, keep bed in low position, maintain call bell within reach at all times, provide assistance with transfer out of bed and ambulation. Intrathecal pump refill done under sterile technique witnessed by Burnett Harry RN.  Removed 5.0 ml and wasted in sink.  Refilled without difficulty.  Site without complications.  Refilled with 40 ml Hydromorphone 8 mg and Sufentanil 70 mcg.  Telemetry verified by Dr Dossie Arbour. Copy of telemetry given to patient.  Signs and symptoms of drug overdose given to patient and husband.

## 2016-12-07 NOTE — Progress Notes (Signed)
Safety precautions to be maintained throughout the outpatient stay will include: orient to surroundings, keep bed in low position, maintain call bell within reach at all times, provide assistance with transfer out of bed and ambulation.  

## 2016-12-07 NOTE — Progress Notes (Addendum)
Patient's Name: Tiffany Hunter  MRN: [id number]  Referring Provider: Venetia Maxon, DO  DOB: 1945-11-03  PCP: Venetia Maxon, DO  DOS: 12/07/2016  Note by: Gaspar Cola, MD  Service setting: Ambulatory outpatient  Specialty: Interventional Pain Management  Patient type: Established  Location: [facility] (AMB) Pain Management Facility  Visit type: Interventional Procedure   Primary Reason for Visit: Interventional Pain Management Treatment. CC: Back Pain (low)  Procedure:  Intrathecal Drug Delivery System (IDDS):  Type: Reservoir Refill [phone]) No rate change Region: Abdominal Laterality: Left  Type of Pump: Medtronic Synchromed II (MRI-compatible) Delivery Route: Intrathecal Type of Pain Treated: Neuropathic/Nociceptive Primary Medication Class: Opioid/opiate  Medication, Concentration, Infusion Program, & Delivery Rate: Please see scanned programming printout.  Indications: 1. Chronic pain syndrome   2. Chronic lower extremity pain (Location of Primary Source of Pain) (Left)   3. Chronic low back pain (Location of Secondary source of pain) (Left)   4. Chronic sacroiliac joint pain (Location of Tertiary source of pain) (Left)   5. Failed back surgical syndrome   6. 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 Long term current use of opiate analgesic   8. Opiate use (586.8 MME/Day)   9. Encounter for adjustment or management of infusion pump    Pain Assessment: Self-Reported Pain Score: 10-Worst pain ever/10 Clinically the patient looks like a 2/10 Reported level is inconsistent with clinical observations. Information on the proper use of the pain score provided to the patient today.  Today I dedicated a considerable amount of time to go over the need to use the pain scale from this practice so that we can eliminate subjective scoring and begin to use a more objective type of score.  Intrathecal Pump Therapy Assessment  Manufacturer: Medtronic  Synchromed II Type: Programmable Volume: 40 mL reservoir MRI compatibility: Yes   Drug content:  Primary Medication Class: Opioid Primary Medication: PF-Hydromorphone (Dilaudid) (8 mg/mL) Secondary Medication: PF-Sufentanil (70 g/mL) Other Medication: see pump readout   Programming:  Type: Simple continuous. See pump readout for details.   Changes:  Medication Change: None at this point Rate Change: No change in rate  Reported side-effects or adverse reactions: None reported  Effectiveness: Described as relatively effective, allowing for increase in activities of daily living (ADL) Clinically meaningful improvement in function (CMIF): Sustained CMIF goals met  Plan: Pump refill today  Pre-op Assessment:  Previous date of service: 09/27/16 Service provided: Procedure (pump refill) Tiffany Hunter is a 71 y.o. (year old), female patient, seen today for interventional treatment. She  has a past surgical history that includes Carpal tunnel release; Back surgery; Breast surgery (Bilateral); Cholecystectomy; Colonoscopy w/ polypectomy; Cataract extraction w/ intraocular lens implant (Right); Fracture surgery (Right); and Abdominal hysterectomy. Tiffany Hunter has a current medication list which includes the following prescription(s): albuterol, aspirin, atorvastatin, carvedilol, tramadol, and PAIN MANAGEMENT IT PUMP REFILL. Her primarily concern today is the Back Pain (low)  The patient continues to complain of significant low back pain (Primary Area of Pain), which she ascribes to be in the midline. In addition she complains about left lower extremity pain (Secondary Area of pain) with a pattern that runs through the posterior aspect of the leg and in his at the level of the knee. Today we will go ahead and order an x-ray of the lumbar spine on flexion and extension and I will see the patient back to see if she may be a good candidate for some diagnostic lumbar facet blocks  and/or SI joint  injections, depending on the results of the physical examination, at that point.  Initial Vital Signs: Blood pressure (!) 140/59, pulse 73, temperature 98.1 F (36.7 C), resp. rate 18, height '5\' 2"'  (1.575 m), weight 180 lb (81.6 kg), SpO2 98 %. BMI: Estimated body mass index is 32.92 kg/m as calculated from the following:   Height as of this encounter: '5\' 2"'  (1.575 m).   Weight as of this encounter: 180 lb (81.6 kg).  Risk Assessment: Allergies: Reviewed. She is allergic to doxycycline hyclate; sulfa antibiotics; and sulfamethoxazole-trimethoprim.  Allergy Precautions: None required Coagulopathies: Reviewed. None identified.  Blood-thinner therapy: None at this time Active Infection(s): Reviewed. None identified. Tiffany Hunter is afebrile  Site Confirmation: Tiffany Hunter was asked to confirm the procedure and laterality before marking the site Procedure checklist: Completed Consent: Before the procedure and under the influence of no sedative(s), amnesic(s), or anxiolytics, the patient was informed of the treatment options, risks and possible complications. To fulfill our ethical and legal obligations, as recommended by the American Medical Association's Code of Ethics, I have informed the patient of my clinical impression; the nature and purpose of the treatment or procedure; the risks, benefits, and possible complications of the intervention; the alternatives, including doing nothing; the risk(s) and benefit(s) of the alternative treatment(s) or procedure(s); and the risk(s) and benefit(s) of doing nothing.  Tiffany Hunter was provided with information about the general risks and possible complications associated with most interventional procedures. These include, but are not limited to: failure to achieve desired goals, infection, bleeding, organ or nerve damage, allergic reactions, paralysis, and/or death.  In addition, she was informed of those risks and possible complications associated to  this particular procedure, which include, but are not limited to: damage to the implant; failure to decrease pain; local, systemic, or serious CNS infections, intraspinal abscess with possible cord compression and paralysis, or life-threatening such as meningitis; bleeding; organ damage; nerve injury or damage with subsequent sensory, motor, and/or autonomic system dysfunction, resulting in transient or permanent pain, numbness, and/or weakness of one or several areas of the body; allergic reactions, either minor or major life-threatening, such as anaphylactic or anaphylactoid reactions.  Furthermore, Tiffany Hunter was informed of those risks and complications associated with the medications. These include, but are not limited to: allergic reactions (i.e.: anaphylactic or anaphylactoid reactions); endorphine suppression; bradycardia and/or hypotension; water retention and/or peripheral vascular relaxation leading to lower extremity edema and possible stasis ulcers; respiratory depression and/or shortness of breath; decreased metabolic rate leading to weight gain; swelling or edema; medication-induced neural toxicity; particulate matter embolism and blood vessel occlusion with resultant organ, and/or nervous system infarction; and/or intrathecal granuloma formation with possible spinal cord compression and permanent paralysis.  Before refilling the pump Tiffany Hunter was informed that some of the medications used in the devise may not be FDA approved for such use and therefore it constitutes an off-label use of the medications.  Finally, she was informed that Medicine is not an exact science; therefore, there is also the possibility of unforeseen or unpredictable risks and/or possible complications that may result in a catastrophic outcome. The patient indicated having understood very clearly. We have given the patient no guarantees and we have made no promises. Enough time was given to the patient to ask  questions, all of which were answered to the patient's satisfaction. Tiffany Hunter has indicated that she wanted to continue with the procedure. Attestation: I, the ordering provider, attest that I have discussed with  the patient the benefits, risks, side-effects, alternatives, likelihood of achieving goals, and potential problems during recovery for the procedure that I have provided informed consent. Date: 12/07/2016; Time: 10:57 AM  Pre-Procedure Preparation:  Monitoring: As per clinic protocol. Respiration, ETCO2, SpO2, BP, heart rate and rhythm monitor placed and checked for adequate function Safety Precautions: Patient was assessed for positional comfort and pressure points before starting the procedure. Time-out: I initiated and conducted the "Time-out" before starting the procedure, as per protocol. The patient was asked to participate by confirming the accuracy of the "Time Out" information. Verification of the correct person, site, and procedure were performed and confirmed by me, the nursing staff, and the patient. "Time-out" conducted as per Joint Commission's Universal Protocol (UP.01.01.01). "Time-out" Date & Time: 12/07/2016; 1059 hrs.  Description of Procedure Process:   Position: Supine Target Area: Central-port of intrathecal pump. Approach: Anterior, 90 degree angle approach. Area Prepped: Entire Area around the pump implant. Prepping solution: ChloraPrep (2% chlorhexidine gluconate and 70% isopropyl alcohol) Safety Precautions: Aspiration looking for blood return was conducted prior to all injections. At no point did we inject any substances, as a needle was being advanced. No attempts were made at seeking any paresthesias. Safe injection practices and needle disposal techniques used. Medications properly checked for expiration dates. SDV (single dose vial) medications used. Description of the Procedure: Protocol guidelines were followed. Two nurses trained to do implant refills  were present during the entire procedure. The refill medication was checked by both healthcare providers as well as the patient. The patient was included in the "Time-out" to verify the medication. The patient was placed in position. The pump was identified. The area was prepped in the usual manner. The sterile template was positioned over the pump, making sure the side-port location matched that of the pump. Both, the pump and the template were held for stability. The needle provided in the Medtronic Kit was then introduced thru the center of the template and into the central port. The pump content was aspirated and discarded volume documented. The new medication was slowly infused into the pump, thru the filter, making sure to avoid overpressure of the device. The needle was then removed and the area cleansed, making sure to leave some of the prepping solution back to take advantage of its long term bactericidal properties. The pump was interrogated and programmed to reflect the correct medication, volume, and dosage. The program was printed and taken to the physician for approval. Once checked and signed by the physician, a copy was provided to the patient and another scanned into the EMR. Vitals:   12/07/16 1041  BP: (!) 140/59  Pulse: 73  Resp: 18  Temp: 98.1 F (36.7 C)  SpO2: 98%  Weight: 180 lb (81.6 kg)  Height: '5\' 2"'  (1.575 m)    Start Time: 1101 hrs. End Time: 1112 hrs. Materials & Medications: Medtronic Refill Kit Medication(s): Please see chart orders for details.  Imaging Guidance:  Type of Imaging Technique: None used Indication(s): N/A Exposure Time: No patient exposure Contrast: None used. Fluoroscopic Guidance: N/A Ultrasound Guidance: N/A Interpretation: N/A  Antibiotic Prophylaxis:  Indication(s): None identified Antibiotic given: None  Post-operative Assessment:  EBL: None Complications: No immediate post-treatment complications observed by team, or reported by  patient. Note: The patient tolerated the entire procedure well. A repeat set of vitals were taken after the procedure and the patient was kept under observation following institutional policy, for this type of procedure. Post-procedural neurological assessment was performed, showing  return to baseline, prior to discharge. The patient was provided with post-procedure discharge instructions, including a section on how to identify potential problems. Should any problems arise concerning this procedure, the patient was given instructions to immediately contact us, at any time, without hesitation. In any case, we plan to contact the patient by telephone for a follow-up status report regarding this interventional procedure. Comments:  No additional relevant information.  Plan of Care  Disposition: Discharge home  Discharge Date & Time: 12/07/2016; 1123 hrs.  Physician-requested Follow-up:  Return for Pump Refill (as per pump program), in addition, return in 1 wk, after test (X-ray) completion.  Future Appointments Date Time Provider [place]  12/14/2016 11:15 AM Milinda Pointer, MD [facility] None  02/13/2017 11:30 AM Milinda Pointer, MD [facility] None   Medications ordered for procedure: No orders of the defined types were placed in this encounter.  Medications administered: Tiffany Hunter had no medications administered during this visit.  See the medical record for exact dosing, route, and time of administration.  Lab-work, Procedure(s), & Referral(s) Ordered: Orders Placed This Encounter  Procedures  . PUMP REFILL  . PUMP REFILL  . DG Lumbar Spine Complete W/Bend   Imaging Ordered: Results for orders placed during the hospital encounter of 04/15/04  DG C-Arm 1-60 Min-No Report   Narrative Clinical Data: Back pain.  FLUOROSCOPY:  Fluoroscopy was utilized by the requesting physician. No radiographic interpretation.    Provider: Michele Rockers, Millicent Sloan   New  Prescriptions   No medications on file   Primary Care Physician: Venetia Maxon, DO Location: [facility] Note by: Gaspar Cola, MD Date: 12/07/2016; Time: 11:32 AM  Disclaimer:  Medicine is not an exact science. The only guarantee in medicine is that nothing is guaranteed. It is important to note that the decision to proceed with this intervention was based on the information collected from the patient. The Data and conclusions were drawn from the patient's questionnaire, the interview, and the physical examination. Because the information was provided in large part by the patient, it cannot be guaranteed that it has not been purposely or unconsciously manipulated. Every effort has been made to obtain as much relevant data as possible for this evaluation. It is important to note that the conclusions that lead to this procedure are derived in large part from the available data. Always take into account that the treatment will also be dependent on availability of resources and existing treatment guidelines, considered by other Pain Management Practitioners as being common knowledge and practice, at the time of the intervention. For Medico-Legal purposes, it is also important to point out that variation in procedural techniques and pharmacological choices are the acceptable norm. The indications, contraindications, technique, and results of the above procedure should only be interpreted and judged by a Board-Certified Interventional Pain Specialist with extensive familiarity and expertise in the same exact procedure and technique.  Instructions provided at this appointment: Patient Instructions   You had your IT pump refilled today.  You have been ordered xrays as well.      Opioid Overdose Opioids are substances that relieve pain by binding to pain receptors in your brain and spinal cord. Opioids include illegal drugs, such as heroin, as well as prescription  pain medicines.An opioid overdose happens when you take too much of an opioid substance. This can happen with any type of opioid, including:  Heroin.  Morphine.  Codeine.  Methadone.  Oxycodone.  Hydrocodone.  Fentanyl.  Hydromorphone.  Buprenorphine.  The effects of an overdose can be mild, dangerous, or even deadly. Opioid overdose is a medical emergency. What are the causes? This condition may be caused by:  Taking too much of an opioid by accident.  Taking too much of an opioid on purpose.  An error made by a health care provider who prescribes a medicine.  An error made by the pharmacist who fills the prescription order.  Using more than one substance that contains opioids at the same time.  Mixing an opioid with a substance that affects your heart, breathing, or blood pressure. These include alcohol, tranquilizers, sleeping pills, illegal drugs, and some over-the-counter medicines.  What increases the risk? This condition is more likely in:  Children. They may be attracted to colorful pills. Because of a child's small size, even a small amount of a drug can be dangerous.  Elderly people. They may be taking many different drugs. Elderly people may have difficulty reading labels or remembering when they last took their medicine.  People who take an opioid on a long-term basis.  People who use: ? Illegal drugs. ? Other substances, including alcohol, while using an opioid.  People who have: ? A history of drug or alcohol abuse. ? Certain mental health conditions.  People who take opioids that are not prescribed for them.  What are the signs or symptoms? Symptoms of this condition depend on the type of opioid and the amount that was taken. Common symptoms include:  Sleepiness or difficulty waking from sleep.  Confusion.  Slurred speech.  Slowed breathing and a slow pulse.  Nausea and vomiting.  Abnormally small pupils.  Signs and symptoms that  require emergency treatment include:  Cold, clammy, and pale skin.  Blue lips and fingernails.  Vomiting.  Gurgling sounds in the throat.  A pulse that is very slow or difficult to detect.  Breathing that is very slow, noisy, or difficult to detect.  Limp body.  Inability to respond to speech or be awakened from sleep (stupor).  How is this diagnosed? This condition is diagnosed based on your symptoms. It is important to tell your health care provider:  All of the opioidsthat you took.  When you took the opioids.  Whether you were drinking alcohol or using other substances.  Your health care provider will do a physical exam. This exam may include:  Checking and monitoring your heart rate and rhythm, your breathing rate and depth, your temperature, and your blood pressure (vital signs).  Checking for abnormally small pupils.  Measuring oxygen levels in your blood.  You may also have blood tests or urine tests. How is this treated? Supporting your vital signs and your breathing is the first step in treating an opioid overdose. Treatment may also include:  Giving fluids and minerals (electrolytes) through an IV tube.  Inserting a breathing tube (endotracheal tube) in your airway to help you breathe.  Giving oxygen.  Passing a tube through your nose and into your stomach (NG tube, or nasogastric tube) to wash out your stomach.  Giving medicines that: ? Increase your blood pressure. ? Absorb any opioid that is in your digestive system. ? Reverse the effects of the opioid (naloxone).  Ongoing counseling and mental health support if you intentionally overdosed or used an illegal drug.  Follow these instructions at home:  Take over-the-counter and prescription medicines only as told by your health care provider. Always ask your health care provider about possible side effects and interactions of any  new medicine that you start taking.  Keep a list of all of the  medicines that you take, including over-the-counter medicines. Bring this list with you to all of your medical visits.  Drink enough fluid to keep your urine clear or pale yellow.  Keep all follow-up visits as told by your health care provider. This is important. How is this prevented?  Get help if you are struggling with: ? Alcohol or drug use. ? Depression or another mental health problem.  Keep the phone number of your local poison control center near your phone or on your cell phone.  Store all medicines in safety containers that are out of the reach of children.  Read the drug inserts that come with your medicines.  Do not drink alcohol when taking opioids.  Do not use illegal drugs.  Do not take opioid medicines that are not prescribed for you. Contact a health care provider if:  Your symptoms return.  You develop new symptoms or side effects when you are taking medicines. Get help right away if:  You think that you or someone else may have taken too much of an opioid. The hotline of the [facility] is [phone].  You or someone else is having symptoms of an opioid overdose.  You have serious thoughts about hurting yourself or others.  You have: ? Chest pain. ? Difficulty breathing. ? A loss of consciousness. Opioid overdose is an emergency. Do not wait to see if the symptoms will go away. Get medical help right away. Call your local emergency services (911 in the [place] Do not drive yourself to the hospital. This information is not intended to replace advice given to you by your health care provider. Make sure you discuss any questions you have with your health care provider. Document Released: 06/09/2004 Document Revised: 10/08/2015 Document Reviewed: 10/16/2014 Elsevier Interactive Patient Education  2018 [employer].   ____________________________________________________________________________________________  Pain  Scale  Introduction: The pain score used by this practice is the Verbal Numerical Rating Scale (VNRS-11). This is an 11-point scale. It is for adults and children 10 years or older. There are significant differences in how the pain score is reported, used, and applied. Forget everything you learned in the past and learn this scoring system.  General Information: The scale should reflect your current level of pain. Unless you are specifically asked for the level of your worst pain, or your average pain. If you are asked for one of these two, then it should be understood that it is over the past 24 hours.  Basic Activities of Daily Living (ADL): Personal hygiene, dressing, eating, transferring, and using restroom.  Instructions: Most patients tend to report their level of pain as a combination of two factors, their physical pain and their psychosocial pain. This last one is also known as "suffering" and it is reflection of how physical pain affects you socially and psychologically. From now on, report them separately. From this point on, when asked to report your pain level, report only your physical pain. Use the following table for reference.  Pain Clinic Pain Levels (0-5/10)  Pain Level Score  Description  No Pain 0   Mild pain 1 Nagging, annoying, but does not interfere with basic activities of daily living (ADL). Patients are able to eat, bathe, get dressed, toileting (being able to get on and off the toilet and perform personal hygiene functions), transfer (move in and out of bed or a chair without assistance), and  maintain continence (able to control bladder and bowel functions). Blood pressure and heart rate are unaffected. A normal heart rate for a healthy adult ranges from 60 to 100 bpm (beats per minute).   Mild to moderate pain 2 Noticeable and distracting. Impossible to hide from other people. More frequent flare-ups. Still possible to adapt and function close to normal. It can be very  annoying and may have occasional stronger flare-ups. With discipline, patients may get used to it and adapt.   Moderate pain 3 Interferes significantly with activities of daily living (ADL). It becomes difficult to feed, bathe, get dressed, get on and off the toilet or to perform personal hygiene functions. Difficult to get in and out of bed or a chair without assistance. Very distracting. With effort, it can be ignored when deeply involved in activities.   Moderately severe pain 4 Impossible to ignore for more than a few minutes. With effort, patients may still be able to manage work or participate in some social activities. Very difficult to concentrate. Signs of autonomic nervous system discharge are evident: dilated pupils (mydriasis); mild sweating (diaphoresis); sleep interference. Heart rate becomes elevated (>115 bpm). Diastolic blood pressure (lower number) rises above 100 mmHg. Patients find relief in laying down and not moving.   Severe pain 5 Intense and extremely unpleasant. Associated with frowning face and frequent crying. Pain overwhelms the senses.  Ability to do any activity or maintain social relationships becomes significantly limited. Conversation becomes difficult. Pacing back and forth is common, as getting into a comfortable position is nearly impossible. Pain wakes you up from deep sleep. Physical signs will be obvious: pupillary dilation; increased sweating; goosebumps; brisk reflexes; cold, clammy hands and feet; nausea, vomiting or dry heaves; loss of appetite; significant sleep disturbance with inability to fall asleep or to remain asleep. When persistent, significant weight loss is observed due to the complete loss of appetite and sleep deprivation.  Blood pressure and heart rate becomes significantly elevated. Caution: If elevated blood pressure triggers a pounding headache associated with blurred vision, then the patient should immediately seek attention at an urgent or  emergency care unit, as these may be signs of an impending stroke.    Emergency Department Pain Levels (6-10/10)  Emergency Room Pain 6 Severely limiting. Requires emergency care and should not be seen or managed at an outpatient pain management facility. Communication becomes difficult and requires great effort. Assistance to reach the emergency department may be required. Facial flushing and profuse sweating along with potentially dangerous increases in heart rate and blood pressure will be evident.   Distressing pain 7 Self-care is very difficult. Assistance is required to transport, or use restroom. Assistance to reach the emergency department will be required. Tasks requiring coordination, such as bathing and getting dressed become very difficult.   Disabling pain 8 Self-care is no longer possible. At this level, pain is disabling. The individual is unable to do even the most "basic" activities such as walking, eating, bathing, dressing, transferring to a bed, or toileting. Fine motor skills are lost. It is difficult to think clearly.   Incapacitating pain 9 Pain becomes incapacitating. Thought processing is no longer possible. Difficult to remember your own name. Control of movement and coordination are lost.   The worst pain imaginable 10 At this level, most patients pass out from pain. When this level is reached, collapse of the autonomic nervous system occurs, leading to a sudden drop in blood pressure and heart rate. This in turn results in  a temporary and dramatic drop in blood flow to the brain, leading to a loss of consciousness. Fainting is one of the body's self defense mechanisms. Passing out puts the brain in a calmed state and causes it to shut down for a while, in order to begin the healing process.    Summary: 1. Refer to this scale when providing [place] with your pain level. 2. Be accurate and careful when reporting your pain level. This will help with your care. 3. Over-reporting  your pain level will lead to loss of credibility. 4. Even a level of 1/10 means that there is pain and will be treated at our facility. 5. High, inaccurate reporting will be documented as "Symptom Exaggeration", leading to loss of credibility and suspicions of possible secondary gains such as obtaining more narcotics, or wanting to appear disabled, for fraudulent reasons. 6. Only pain levels of 5 or below will be seen at our facility. 7. Pain levels of 6 and above will be sent to the Emergency Department and the appointment cancelled. ____________________________________________________________________________________________

## 2016-12-07 NOTE — Patient Instructions (Addendum)
You had your IT pump refilled today.  You have been ordered xrays as well.      Opioid Overdose Opioids are substances that relieve pain by binding to pain receptors in your brain and spinal cord. Opioids include illegal drugs, such as heroin, as well as prescription pain medicines.An opioid overdose happens when you take too much of an opioid substance. This can happen with any type of opioid, including:  Heroin.  Morphine.  Codeine.  Methadone.  Oxycodone.  Hydrocodone.  Fentanyl.  Hydromorphone.  Buprenorphine.  The effects of an overdose can be mild, dangerous, or even deadly. Opioid overdose is a medical emergency. What are the causes? This condition may be caused by:  Taking too much of an opioid by accident.  Taking too much of an opioid on purpose.  An error made by a health care provider who prescribes a medicine.  An error made by the pharmacist who fills the prescription order.  Using more than one substance that contains opioids at the same time.  Mixing an opioid with a substance that affects your heart, breathing, or blood pressure. These include alcohol, tranquilizers, sleeping pills, illegal drugs, and some over-the-counter medicines.  What increases the risk? This condition is more likely in:  Children. They may be attracted to colorful pills. Because of a child's small size, even a small amount of a drug can be dangerous.  Elderly people. They may be taking many different drugs. Elderly people may have difficulty reading labels or remembering when they last took their medicine.  People who take an opioid on a long-term basis.  People who use: ? Illegal drugs. ? Other substances, including alcohol, while using an opioid.  People who have: ? A history of drug or alcohol abuse. ? Certain mental health conditions.  People who take opioids that are not prescribed for them.  What are the signs or symptoms? Symptoms of this condition depend on  the type of opioid and the amount that was taken. Common symptoms include:  Sleepiness or difficulty waking from sleep.  Confusion.  Slurred speech.  Slowed breathing and a slow pulse.  Nausea and vomiting.  Abnormally small pupils.  Signs and symptoms that require emergency treatment include:  Cold, clammy, and pale skin.  Blue lips and fingernails.  Vomiting.  Gurgling sounds in the throat.  A pulse that is very slow or difficult to detect.  Breathing that is very slow, noisy, or difficult to detect.  Limp body.  Inability to respond to speech or be awakened from sleep (stupor).  How is this diagnosed? This condition is diagnosed based on your symptoms. It is important to tell your health care provider:  All of the opioidsthat you took.  When you took the opioids.  Whether you were drinking alcohol or using other substances.  Your health care provider will do a physical exam. This exam may include:  Checking and monitoring your heart rate and rhythm, your breathing rate and depth, your temperature, and your blood pressure (vital signs).  Checking for abnormally small pupils.  Measuring oxygen levels in your blood.  You may also have blood tests or urine tests. How is this treated? Supporting your vital signs and your breathing is the first step in treating an opioid overdose. Treatment may also include:  Giving fluids and minerals (electrolytes) through an IV tube.  Inserting a breathing tube (endotracheal tube) in your airway to help you breathe.  Giving oxygen.  Passing a tube through your nose and  into your stomach (NG tube, or nasogastric tube) to wash out your stomach.  Giving medicines that: ? Increase your blood pressure. ? Absorb any opioid that is in your digestive system. ? Reverse the effects of the opioid (naloxone).  Ongoing counseling and mental health support if you intentionally overdosed or used an illegal drug.  Follow these  instructions at home:  Take over-the-counter and prescription medicines only as told by your health care provider. Always ask your health care provider about possible side effects and interactions of any new medicine that you start taking.  Keep a list of all of the medicines that you take, including over-the-counter medicines. Bring this list with you to all of your medical visits.  Drink enough fluid to keep your urine clear or pale yellow.  Keep all follow-up visits as told by your health care provider. This is important. How is this prevented?  Get help if you are struggling with: ? Alcohol or drug use. ? Depression or another mental health problem.  Keep the phone number of your local poison control center near your phone or on your cell phone.  Store all medicines in safety containers that are out of the reach of children.  Read the drug inserts that come with your medicines.  Do not drink alcohol when taking opioids.  Do not use illegal drugs.  Do not take opioid medicines that are not prescribed for you. Contact a health care provider if:  Your symptoms return.  You develop new symptoms or side effects when you are taking medicines. Get help right away if:  You think that you or someone else may have taken too much of an opioid. The hotline of the [facility] is [phone].  You or someone else is having symptoms of an opioid overdose.  You have serious thoughts about hurting yourself or others.  You have: ? Chest pain. ? Difficulty breathing. ? A loss of consciousness. Opioid overdose is an emergency. Do not wait to see if the symptoms will go away. Get medical help right away. Call your local emergency services (911 in the [place] Do not drive yourself to the hospital. This information is not intended to replace advice given to you by your health care provider. Make sure you discuss any questions you have with your health care  provider. Document Released: 06/09/2004 Document Revised: 10/08/2015 Document Reviewed: 10/16/2014 Elsevier Interactive Patient Education  2018 [employer].   ____________________________________________________________________________________________  Pain Scale  Introduction: The pain score used by this practice is the Verbal Numerical Rating Scale (VNRS-11). This is an 11-point scale. It is for adults and children 10 years or older. There are significant differences in how the pain score is reported, used, and applied. Forget everything you learned in the past and learn this scoring system.  General Information: The scale should reflect your current level of pain. Unless you are specifically asked for the level of your worst pain, or your average pain. If you are asked for one of these two, then it should be understood that it is over the past 24 hours.  Basic Activities of Daily Living (ADL): Personal hygiene, dressing, eating, transferring, and using restroom.  Instructions: Most patients tend to report their level of pain as a combination of two factors, their physical pain and their psychosocial pain. This last one is also known as "suffering" and it is reflection of how physical pain affects you socially and psychologically. From now on, report them separately. From this  point on, when asked to report your pain level, report only your physical pain. Use the following table for reference.  Pain Clinic Pain Levels (0-5/10)  Pain Level Score  Description  No Pain 0   Mild pain 1 Nagging, annoying, but does not interfere with basic activities of daily living (ADL). Patients are able to eat, bathe, get dressed, toileting (being able to get on and off the toilet and perform personal hygiene functions), transfer (move in and out of bed or a chair without assistance), and maintain continence (able to control bladder and bowel functions). Blood pressure and heart rate are unaffected. A normal  heart rate for a healthy adult ranges from 60 to 100 bpm (beats per minute).   Mild to moderate pain 2 Noticeable and distracting. Impossible to hide from other people. More frequent flare-ups. Still possible to adapt and function close to normal. It can be very annoying and may have occasional stronger flare-ups. With discipline, patients may get used to it and adapt.   Moderate pain 3 Interferes significantly with activities of daily living (ADL). It becomes difficult to feed, bathe, get dressed, get on and off the toilet or to perform personal hygiene functions. Difficult to get in and out of bed or a chair without assistance. Very distracting. With effort, it can be ignored when deeply involved in activities.   Moderately severe pain 4 Impossible to ignore for more than a few minutes. With effort, patients may still be able to manage work or participate in some social activities. Very difficult to concentrate. Signs of autonomic nervous system discharge are evident: dilated pupils (mydriasis); mild sweating (diaphoresis); sleep interference. Heart rate becomes elevated (>115 bpm). Diastolic blood pressure (lower number) rises above 100 mmHg. Patients find relief in laying down and not moving.   Severe pain 5 Intense and extremely unpleasant. Associated with frowning face and frequent crying. Pain overwhelms the senses.  Ability to do any activity or maintain social relationships becomes significantly limited. Conversation becomes difficult. Pacing back and forth is common, as getting into a comfortable position is nearly impossible. Pain wakes you up from deep sleep. Physical signs will be obvious: pupillary dilation; increased sweating; goosebumps; brisk reflexes; cold, clammy hands and feet; nausea, vomiting or dry heaves; loss of appetite; significant sleep disturbance with inability to fall asleep or to remain asleep. When persistent, significant weight loss is observed due to the complete loss of  appetite and sleep deprivation.  Blood pressure and heart rate becomes significantly elevated. Caution: If elevated blood pressure triggers a pounding headache associated with blurred vision, then the patient should immediately seek attention at an urgent or emergency care unit, as these may be signs of an impending stroke.    Emergency Department Pain Levels (6-10/10)  Emergency Room Pain 6 Severely limiting. Requires emergency care and should not be seen or managed at an outpatient pain management facility. Communication becomes difficult and requires great effort. Assistance to reach the emergency department may be required. Facial flushing and profuse sweating along with potentially dangerous increases in heart rate and blood pressure will be evident.   Distressing pain 7 Self-care is very difficult. Assistance is required to transport, or use restroom. Assistance to reach the emergency department will be required. Tasks requiring coordination, such as bathing and getting dressed become very difficult.   Disabling pain 8 Self-care is no longer possible. At this level, pain is disabling. The individual is unable to do even the most "basic" activities such as walking, eating,  bathing, dressing, transferring to a bed, or toileting. Fine motor skills are lost. It is difficult to think clearly.   Incapacitating pain 9 Pain becomes incapacitating. Thought processing is no longer possible. Difficult to remember your own name. Control of movement and coordination are lost.   The worst pain imaginable 10 At this level, most patients pass out from pain. When this level is reached, collapse of the autonomic nervous system occurs, leading to a sudden drop in blood pressure and heart rate. This in turn results in a temporary and dramatic drop in blood flow to the brain, leading to a loss of consciousness. Fainting is one of the body's self defense mechanisms. Passing out puts the brain in a calmed state and  causes it to shut down for a while, in order to begin the healing process.    Summary: 1. Refer to this scale when providing [place] with your pain level. 2. Be accurate and careful when reporting your pain level. This will help with your care. 3. Over-reporting your pain level will lead to loss of credibility. 4. Even a level of 1/10 means that there is pain and will be treated at our facility. 5. High, inaccurate reporting will be documented as "Symptom Exaggeration", leading to loss of credibility and suspicions of possible secondary gains such as obtaining more narcotics, or wanting to appear disabled, for fraudulent reasons. 6. Only pain levels of 5 or below will be seen at our facility. 7. Pain levels of 6 and above will be sent to the Emergency Department and the appointment cancelled. ____________________________________________________________________________________________

## 2016-12-08 ENCOUNTER — Telehealth: Payer: Self-pay | Admitting: *Deleted

## 2016-12-08 MED FILL — Medication: INTRATHECAL | Qty: 1 | Status: AC

## 2016-12-08 NOTE — Telephone Encounter (Signed)
No problems after pump refill. 

## 2016-12-14 ENCOUNTER — Encounter: Payer: Self-pay | Admitting: Pain Medicine

## 2016-12-14 ENCOUNTER — Ambulatory Visit: Payer: Medicare Other | Attending: Pain Medicine | Admitting: Pain Medicine

## 2016-12-14 VITALS — BP 110/71 | HR 71 | Temp 98.5°F | Resp 18 | Ht 62.0 in | Wt 180.0 lb

## 2016-12-14 DIAGNOSIS — G8929 Other chronic pain: Secondary | ICD-10-CM

## 2016-12-14 DIAGNOSIS — E7801 Familial hypercholesterolemia: Secondary | ICD-10-CM | POA: Diagnosis not present

## 2016-12-14 DIAGNOSIS — M79605 Pain in left leg: Secondary | ICD-10-CM

## 2016-12-14 DIAGNOSIS — M545 Low back pain: Secondary | ICD-10-CM | POA: Diagnosis not present

## 2016-12-14 DIAGNOSIS — M25521 Pain in right elbow: Secondary | ICD-10-CM | POA: Diagnosis not present

## 2016-12-14 DIAGNOSIS — M533 Sacrococcygeal disorders, not elsewhere classified: Secondary | ICD-10-CM | POA: Diagnosis not present

## 2016-12-14 DIAGNOSIS — F1190 Opioid use, unspecified, uncomplicated: Secondary | ICD-10-CM

## 2016-12-14 DIAGNOSIS — Z79899 Other long term (current) drug therapy: Secondary | ICD-10-CM | POA: Diagnosis not present

## 2016-12-14 DIAGNOSIS — Z882 Allergy status to sulfonamides status: Secondary | ICD-10-CM | POA: Insufficient documentation

## 2016-12-14 DIAGNOSIS — E1122 Type 2 diabetes mellitus with diabetic chronic kidney disease: Secondary | ICD-10-CM | POA: Insufficient documentation

## 2016-12-14 DIAGNOSIS — N182 Chronic kidney disease, stage 2 (mild): Secondary | ICD-10-CM | POA: Insufficient documentation

## 2016-12-14 DIAGNOSIS — Z79891 Long term (current) use of opiate analgesic: Secondary | ICD-10-CM | POA: Diagnosis not present

## 2016-12-14 DIAGNOSIS — J45909 Unspecified asthma, uncomplicated: Secondary | ICD-10-CM | POA: Insufficient documentation

## 2016-12-14 DIAGNOSIS — Z881 Allergy status to other antibiotic agents status: Secondary | ICD-10-CM | POA: Diagnosis not present

## 2016-12-14 DIAGNOSIS — M5116 Intervertebral disc disorders with radiculopathy, lumbar region: Secondary | ICD-10-CM | POA: Insufficient documentation

## 2016-12-14 DIAGNOSIS — M4696 Unspecified inflammatory spondylopathy, lumbar region: Secondary | ICD-10-CM

## 2016-12-14 DIAGNOSIS — M47816 Spondylosis without myelopathy or radiculopathy, lumbar region: Secondary | ICD-10-CM | POA: Diagnosis not present

## 2016-12-14 DIAGNOSIS — M25562 Pain in left knee: Secondary | ICD-10-CM | POA: Insufficient documentation

## 2016-12-14 DIAGNOSIS — G894 Chronic pain syndrome: Secondary | ICD-10-CM | POA: Diagnosis not present

## 2016-12-14 DIAGNOSIS — I2510 Atherosclerotic heart disease of native coronary artery without angina pectoris: Secondary | ICD-10-CM | POA: Diagnosis not present

## 2016-12-14 DIAGNOSIS — M1712 Unilateral primary osteoarthritis, left knee: Secondary | ICD-10-CM | POA: Insufficient documentation

## 2016-12-14 DIAGNOSIS — M5442 Lumbago with sciatica, left side: Secondary | ICD-10-CM

## 2016-12-14 DIAGNOSIS — M961 Postlaminectomy syndrome, not elsewhere classified: Secondary | ICD-10-CM

## 2016-12-14 DIAGNOSIS — Z9689 Presence of other specified functional implants: Secondary | ICD-10-CM | POA: Insufficient documentation

## 2016-12-14 DIAGNOSIS — I2721 Secondary pulmonary arterial hypertension: Secondary | ICD-10-CM | POA: Insufficient documentation

## 2016-12-14 DIAGNOSIS — Z7982 Long term (current) use of aspirin: Secondary | ICD-10-CM | POA: Insufficient documentation

## 2016-12-14 DIAGNOSIS — M1612 Unilateral primary osteoarthritis, left hip: Secondary | ICD-10-CM | POA: Diagnosis not present

## 2016-12-14 DIAGNOSIS — I129 Hypertensive chronic kidney disease with stage 1 through stage 4 chronic kidney disease, or unspecified chronic kidney disease: Secondary | ICD-10-CM | POA: Diagnosis not present

## 2016-12-14 NOTE — Progress Notes (Signed)
Safety precautions to be maintained throughout the outpatient stay will include: orient to surroundings, keep bed in low position, maintain call bell within reach at all times, provide assistance with transfer out of bed and ambulation.  

## 2016-12-14 NOTE — Progress Notes (Signed)
Patient's Name: Tiffany Hunter  MRN: [id number]  Referring Provider: Venetia Maxon, DO  DOB: 06/19/1945  PCP: Iva Lento, PA-C  DOS: 12/14/2016  Note by: Gaspar Cola, MD  Service setting: Ambulatory outpatient  Specialty: Interventional Pain Management  Location: [facility] (AMB) Pain Management Facility    Patient type: Established   Primary Reason(s) for Visit: Evaluation of chronic illnesses with exacerbation, or progression (Level of risk: moderate) CC: Back Pain (left, lower)  HPI  Tiffany Hunter is a 71 y.o. year old, female patient, who comes today for a follow-up evaluation. 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); Long term current use of opiate analgesic; Opiate use (586.8 MME/Day); Encounter for adjustment or management of infusion pump; Atypical chest pain; Arteriosclerosis of coronary artery; Carotid artery bruit; Chronic kidney disease (CKD), stage II (mild); Diabetes mellitus ([place]); Breathlessness on exertion; Benign hypertension; Combined fat and carbohydrate induced hyperlipemia; Chronic midline low back pain (Primary Area of Pain); Osteoarthritis; Lumbar spondylosis; 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); Chronic lumbar radicular pain (L4 Dermatome) (Left); Coccygodynia; Chronic hip pain (Left); Osteoarthritis of hip (Left); Chronic knee pain (Left); Osteoarthritis of knee (Left); Thoracic bulging disc without myelopathy; Generalized weakness; Disturbance of skin sensation; Chronic lower extremity pain (Secondary Area of pain) (Left); Chronic sacroiliac joint pain (Location of Tertiary source of pain) (Left); Mammographic microcalcification found on diagnostic imaging of breast; Swelling; Pulmonary arterial hypertension ([place]); Gastroenteritis; Generalized abdominal pain; Right elbow pain; Opioid-induced constipation (OIC);  and Lumbar facet joint syndrome (HCC)(L) on her problem list. Tiffany Hunter was last seen on 12/07/2016. Her primarily concern today is the Back Pain (left, lower)  Pain Assessment: Location: Left, Lower Back Radiating: left leg to the knee Onset: More than a month ago Duration: Chronic pain Quality: Aching, Sharp, Stabbing, Shooting, Throbbing Severity: 0-No pain/10 (self-reported pain score)  Note: Reported level is compatible with observation.                   Timing: Intermittent Modifying factors: rest, heat  The patient returns to the clinic today to go over the results of her recent x-rays. In addition to this, we have reevaluated her symptoms and the physical exam confirms that the patient's pain appears to be coming from the lumbar facets on the left side, as evidenced by ipsilateral pain on hyperextension and rotation towards the left side. Physical exam today was negative for SI joint pain bilaterally on the Patrick's maneuver. However, she did seem to have some discomfort and decreased range of motion of the left hip and some significant pain on the left knee. She indicates that she is currently having this evaluated by orthopedic surgeon for possible replacement.  Further details on both, my assessment(s), as well as the proposed treatment plan, please see below.  Laboratory Chemistry  Inflammation Markers (CRP: Acute Phase) (ESR: Chronic Phase) Lab Results  Component Value Date   CRP 0.9 10/29/2015   ESRSEDRATE 16 10/29/2015                 Renal Function Markers Lab Results  Component Value Date   BUN 9 10/29/2015   CREATININE 0.45 10/29/2015   GFRAA >60 10/29/2015   GFRNONAA >60 10/29/2015                 Hepatic Function Markers Lab Results  Component Value Date   AST 34 10/29/2015   ALT  23 10/29/2015   ALBUMIN 4.2 10/29/2015   ALKPHOS 69 10/29/2015                 Electrolytes Lab Results  Component Value Date   NA 140 10/29/2015   K 3.7 10/29/2015    CL 101 10/29/2015   CALCIUM 8.9 10/29/2015   MG 1.8 10/29/2015                 Neuropathy Markers Lab Results  Component Value Date   VITAMINB12 329 10/29/2015                 Bone Pathology Markers Lab Results  Component Value Date   ALKPHOS 69 10/29/2015   25OHVITD1 32 10/29/2015   25OHVITD2 18 10/29/2015   25OHVITD3 14 10/29/2015   CALCIUM 8.9 10/29/2015                 Coagulation Parameters Lab Results  Component Value Date   INR 1.1 (H) 12/16/2010   LABPROT 12.1 12/16/2010   PLT 161 10/29/2015                 Cardiovascular Markers Lab Results  Component Value Date   BNP 53.0 10/29/2015   HGB 13.5 10/29/2015   HCT 40.4 10/29/2015                 Note: Lab results reviewed.  Recent Diagnostic Imaging Review  Dg Lumbar Spine Complete W/bend Result Date: 12/07/2016 CLINICAL DATA:  Chronic low back pain.  Left lower extremity pain. EXAM: LUMBAR SPINE - COMPLETE WITH BENDING VIEWS COMPARISON:  10/29/2015 FINDINGS: There are 5 non rib-bearing lumbar type vertebrae. Vertebral alignment is normal. There is no evidence of dynamic instability on flexion or extension images. Sequelae of L5-S1 Ray cage fusion are again noted. Mild disc space narrowing at L1-2 is similar to the prior study. Mild endplate spurring is present at L1-2 and in the lower thoracic spine. No pars defects are identified. An intrathecal catheter terminates just right of midline in the ventral canal at the T11 superior endplate level. Right upper quadrant abdominal surgical clips are noted. IMPRESSION: 1. Similar appearance of mild disc degeneration at L1-2. 2. Unchanged L5-S1 fusion. Electronically Signed   By: Logan Bores M.D.   On: 12/07/2016 14:35   Lumbosacral Imaging: Lumbar DG Bending views:  Results for orders placed during the hospital encounter of 12/07/16  DG Lumbar Spine Complete W/Bend   Narrative CLINICAL DATA:  Chronic low back pain.  Left lower extremity pain.  EXAM: LUMBAR SPINE -  COMPLETE WITH BENDING VIEWS  COMPARISON:  10/29/2015  FINDINGS: There are 5 non rib-bearing lumbar type vertebrae. Vertebral alignment is normal. There is no evidence of dynamic instability on flexion or extension images. Sequelae of L5-S1 Ray cage fusion are again noted. Mild disc space narrowing at L1-2 is similar to the prior study. Mild endplate spurring is present at L1-2 and in the lower thoracic spine. No pars defects are identified. An intrathecal catheter terminates just right of midline in the ventral canal at the T11 superior endplate level. Right upper quadrant abdominal surgical clips are noted.  IMPRESSION: 1. Similar appearance of mild disc degeneration at L1-2. 2. Unchanged L5-S1 fusion.   Electronically Signed   By: Logan Bores M.D.   On: 12/07/2016 14:35    Sacroiliac Joint Imaging: Sacroiliac Joint DG:  Results for orders placed during the hospital encounter of 10/29/15  DG Si Joints   Narrative CLINICAL DATA:  Worsening left low back pain radiating to the left leg with numbness and tingling  EXAM: BILATERAL SACROILIAC JOINTS - 3+ VIEW  COMPARISON:  Abdomen films of 06/24/2014  FINDINGS: The SI joints appear well corticated. There is mild degenerative spurring present inferiorly. No sacroiliitis is seen. The sacral foramina appear corticated. No acute abnormality is seen.  IMPRESSION: No sacroiliitis.  Degenerative change involves the SI joints.   Electronically Signed   By: Ivar Drape M.D.   On: 10/29/2015 11:37    Note: Results of ordered imaging test(s) reviewed and explained to patient in Layman's terms. Copy of results provided to patient  Meds   Current Meds  Medication Sig  . albuterol (PROVENTIL HFA;VENTOLIN HFA) 108 (90 Base) MCG/ACT inhaler Inhale into the lungs.  Marland Kitchen aspirin 81 MG tablet Take 81 mg by mouth daily.  Marland Kitchen atorvastatin (LIPITOR) 20 MG tablet   . carvedilol (COREG) 3.125 MG tablet Take 3.125 mg by mouth 2 (two) times  daily with a meal.   . traMADol (ULTRAM) 50 MG tablet Take 50 mg by mouth as needed.     ROS  Constitutional: Denies any fever or chills Gastrointestinal: No reported hemesis, hematochezia, vomiting, or acute GI distress Musculoskeletal: Denies any acute onset joint swelling, redness, loss of ROM, or weakness Neurological: No reported episodes of acute onset apraxia, aphasia, dysarthria, agnosia, amnesia, paralysis, loss of coordination, or loss of consciousness  Allergies  Tiffany Hunter is allergic to doxycycline hyclate; sulfa antibiotics; and sulfamethoxazole-trimethoprim.  PFSH  Drug: Tiffany Hunter  reports that she does not use drugs. Alcohol:  reports that she does not drink alcohol. Tobacco:  reports that she has never smoked. She has never used smokeless tobacco. Medical:  has a past medical history of Abnormal EKG; Arthritis; Asthma; Backhand tennis elbow (11/20/2012); Barrett's esophagus; CAD (coronary artery disease); Cancer ([place]); Carotid artery bruit; Cataract; Chest wall pain; Colonic polyp; Difficult or painful urination (01/21/2014); Exogenous obesity; GERD (gastroesophageal reflux disease); H/O: hysterectomy; Heart murmur; Hyperlipidemia; Knee pain; Low back pain; Menopausal symptom (05/15/2015); Osteoporosis; Ovarian failure; Presence of functional implant (Medtronic programmable intrathecal pump) (03/20/2015); Sinusitis; and Weakness. Surgical: Tiffany Hunter  has a past surgical history that includes Carpal tunnel release; Back surgery; Breast surgery (Bilateral); Cholecystectomy; Colonoscopy w/ polypectomy; Cataract extraction w/ intraocular lens implant (Right); Fracture surgery (Right); and Abdominal hysterectomy. Family: family history includes Cancer in her mother; Colon cancer in her unknown relative; Coronary artery disease in her unknown relative; Heart disease in her father.  Constitutional Exam  General appearance: Well nourished, well developed, and well hydrated. In no  apparent acute distress Vitals:   12/14/16 1124  BP: 110/71  Pulse: 71  Resp: 18  Temp: 98.5 F (36.9 C)  TempSrc: Oral  SpO2: 94%  Weight: 180 lb (81.6 kg)  Height: _0  (1.575 m)   BMI Assessment: Estimated body mass index is 32.92 kg/m as calculated from the following:   Height as of this encounter: _1  (1.575 m).   Weight as of this encounter: 180 lb (81.6 kg).  BMI interpretation table: BMI level Category Range association with higher incidence of chronic pain  <18 kg/m2 Underweight   18.5-24.9 kg/m2 Ideal body weight   25-29.9 kg/m2 Overweight Increased incidence by 20%  30-34.9 kg/m2 Obese (Class I) Increased incidence by 68%  35-39.9 kg/m2 Severe obesity (Class II) Increased incidence by 136%  >40 kg/m2 Extreme obesity (Class III) Increased incidence by 254%   BMI Readings from Last 4 Encounters:  12/14/16 32.92  kg/m  12/07/16 32.92 kg/m  09/27/16 32.92 kg/m  07/20/16 32.92 kg/m   Wt Readings from Last 4 Encounters:  12/14/16 180 lb (81.6 kg)  12/07/16 180 lb (81.6 kg)  09/27/16 180 lb (81.6 kg)  07/20/16 180 lb (81.6 kg)  Psych/Mental status: Alert, oriented x 3 (person, place, & time)       Eyes: PERLA Respiratory: No evidence of acute respiratory distress  Cervical Spine Exam  Inspection: No masses, redness, or swelling Alignment: Symmetrical Functional ROM: Unrestricted ROM      Stability: No instability detected Muscle strength & Tone: Functionally intact Sensory: Unimpaired Palpation: No palpable anomalies              Upper Extremity (UE) Exam    Side: Right upper extremity  Side: Left upper extremity  Inspection: No masses, redness, swelling, or asymmetry. No contractures  Inspection: No masses, redness, swelling, or asymmetry. No contractures  Functional ROM: Unrestricted ROM          Functional ROM: Unrestricted ROM          Muscle strength & Tone: Functionally intact  Muscle strength & Tone: Functionally intact  Sensory: Unimpaired   Sensory: Unimpaired  Palpation: No palpable anomalies              Palpation: No palpable anomalies              Specialized Test(s): Deferred         Specialized Test(s): Deferred          Thoracic Spine Exam  Inspection: No masses, redness, or swelling Alignment: Symmetrical Functional ROM: Unrestricted ROM Stability: No instability detected Sensory: Unimpaired Muscle strength & Tone: No palpable anomalies  Lumbar Spine Exam  Inspection: No masses, redness, or swelling Alignment: Symmetrical Functional ROM: Diminished ROM      Stability: No instability detected Muscle strength & Tone: Functionally intact Sensory: Movement-associated pain Palpation: Complains of area being tender to palpation       Provocative Tests: Lumbar Hyperextension and rotation test: Positive on the left for facet joint pain. Lumbar Lateral bending test: evaluation deferred today       Patrick's Maneuver: Negative for bilateral S-I arthralgia              Gait & Posture Assessment  Ambulation: Unassisted Gait: Relatively normal for age and body habitus Posture: WNL   Lower Extremity Exam    Side: Right lower extremity  Side: Left lower extremity  Inspection: No masses, redness, swelling, or asymmetry. No contractures  Inspection: No masses, redness, swelling, or asymmetry. No contractures  Functional ROM: Unrestricted ROM          Functional ROM: Unrestricted ROM          Muscle strength & Tone: Functionally intact  Muscle strength & Tone: Functionally intact  Sensory: Unimpaired  Sensory: Unimpaired  Palpation: No palpable anomalies  Palpation: No palpable anomalies   Assessment  Primary Diagnosis & Pertinent Problem List: The primary encounter diagnosis was Chronic midline low back pain (Primary Area of Pain). Diagnoses of Chronic lower extremity pain (Secondary Area of pain) (Left), Chronic sacroiliac joint pain (Location of Tertiary source of pain) (Left), Failed back surgical syndrome (30 years  ago), Lumbar spondylosis, Opiate use (586.8 MME/Day), and Lumbar facet joint syndrome (HCC)(L) were also pertinent to this visit.  Status Diagnosis  Controlled Controlled Controlled 1. Chronic midline low back pain (Primary Area of Pain)   2. Chronic lower extremity pain (Secondary Area of pain) (Left)  3. Chronic sacroiliac joint pain (Location of Tertiary source of pain) (Left)   4. 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 Lumbar spondylosis   6. Opiate use (586.8 MME/Day)   7. Lumbar facet joint syndrome (HCC)(L)     Problems updated and reviewed during this visit: Problem  Lumbar facet joint syndrome (HCC)(L)   Plan of Care  Pharmacotherapy (Medications Ordered): No orders of the defined types were placed in this encounter.  New Prescriptions   No medications on file   Medications administered today: Tiffany Hunter had no medications administered during this visit.  Lab-work, procedure(s), and/or referral(s): Orders Placed This Encounter  Procedures  . LUMBAR FACET(MEDIAL BRANCH NERVE BLOCK) MBNB    Interventional management options: Planned, scheduled, and/or pending:   Diagnostic left sided lumbar facet block #1 under fluoroscopic guidance and IV sedation    Considering:   Diagnostic left sided lumbar facet block #2  Possible left-sided lumbar facet RFA    Palliative PRN treatment(s):   Palliative Intrathecal pump refill as per programming    Provider-requested follow-up: Return for Procedure (with sedation): Dx (L) L-FCT BLK.  Future Appointments Date Time Provider [place]  12/22/2016 9:15 AM Milinda Pointer, MD [facility] None  02/13/2017 11:30 AM Milinda Pointer, MD [facility] None   Primary Care Physician: Iva Lento, PA-C Location: [facility] Note by: Gaspar Cola, MD Date: 12/14/2016; Time: 7:56 PM  Patient Instructions    ____________________________________________________________________________________________  Preparing for Procedure with Sedation Instructions: . Oral Intake: Do not eat or drink anything for at least 8 hours prior to your procedure. . Transportation: Public transportation is not allowed. Bring an adult driver. The driver must be physically present in our waiting room before any procedure can be started. Marland Kitchen Physical Assistance: Bring an adult physically capable of assisting you, in the event you need help. This adult should keep you company at home for at least 6 hours after the procedure. . Blood Pressure Medicine: Take your blood pressure medicine with a sip of water the morning of the procedure. . Blood thinners:  . Diabetics on insulin: Notify the staff so that you can be scheduled 1st case in the morning. If your diabetes requires high dose insulin, take only  of your normal insulin dose the morning of the procedure and notify the staff that you have done so. . Preventing infections: Shower with an antibacterial soap the morning of your procedure. . Build-up your immune system: Take 1000 mg of Vitamin C with every meal (3 times a day) the day prior to your procedure. Marland Kitchen Antibiotics: Inform the staff if you have a condition or reason that requires you to take antibiotics before dental procedures. . Pregnancy: If you are pregnant, call and cancel the procedure. . Sickness: If you have a cold, fever, or any active infections, call and cancel the procedure. . Arrival: You must be in the facility at least 30 minutes prior to your scheduled procedure. . Children: Do not bring children with you. . Dress appropriately: Bring dark clothing that you would not mind if they get stained. . Valuables: Do not bring any jewelry or valuables. Procedure appointments are reserved for interventional treatments only. Marland Kitchen No Prescription Refills. . No medication changes will be discussed during procedure  appointments. . No disability issues will be discussed. ____________________________________________________________________________________________

## 2016-12-14 NOTE — Patient Instructions (Signed)

## 2016-12-22 ENCOUNTER — Ambulatory Visit
Admission: RE | Admit: 2016-12-22 | Discharge: 2016-12-22 | Disposition: A | Discharge status: Home / Self Care | Payer: Medicare Other | Source: Ambulatory Visit | Attending: Pain Medicine | Admitting: Pain Medicine

## 2016-12-22 ENCOUNTER — Encounter: Payer: Self-pay | Admitting: Pain Medicine

## 2016-12-22 ENCOUNTER — Ambulatory Visit (HOSPITAL_BASED_OUTPATIENT_CLINIC_OR_DEPARTMENT_OTHER): Payer: Medicare Other | Admitting: Pain Medicine

## 2016-12-22 VITALS — BP 154/74 | HR 75 | Temp 98.6°F | Resp 16 | Ht 62.0 in | Wt 180.0 lb

## 2016-12-22 DIAGNOSIS — M47816 Spondylosis without myelopathy or radiculopathy, lumbar region: Secondary | ICD-10-CM | POA: Insufficient documentation

## 2016-12-22 DIAGNOSIS — M4696 Unspecified inflammatory spondylopathy, lumbar region: Secondary | ICD-10-CM | POA: Insufficient documentation

## 2016-12-22 DIAGNOSIS — M5416 Radiculopathy, lumbar region: Secondary | ICD-10-CM | POA: Diagnosis not present

## 2016-12-22 DIAGNOSIS — G8929 Other chronic pain: Secondary | ICD-10-CM

## 2016-12-22 MED ORDER — ROPIVACAINE HCL 2 MG/ML IJ SOLN
INTRAMUSCULAR | Status: AC
  Filled 2016-12-22: qty 10

## 2016-12-22 MED ORDER — FENTANYL CITRATE (PF) 100 MCG/2ML IJ SOLN
INTRAMUSCULAR | Status: AC
  Filled 2016-12-22: qty 2

## 2016-12-22 MED ORDER — LACTATED RINGERS IV SOLN
1000.0000 mL | Freq: Once | INTRAVENOUS | Status: AC
  Administered 2016-12-22: 1000 mL via INTRAVENOUS

## 2016-12-22 MED ORDER — MIDAZOLAM HCL 5 MG/5ML IJ SOLN
1.0000 mg | INTRAMUSCULAR | Status: DC | PRN
  Administered 2016-12-22: 5 mg via INTRAVENOUS

## 2016-12-22 MED ORDER — TRIAMCINOLONE ACETONIDE 40 MG/ML IJ SUSP
40.0000 mg | Freq: Once | INTRAMUSCULAR | Status: AC
Start: 2016-12-22 — End: 2016-12-22
  Administered 2016-12-22: 40 mg

## 2016-12-22 MED ORDER — ROPIVACAINE HCL 2 MG/ML IJ SOLN
9.0000 mL | Freq: Once | INTRAMUSCULAR | Status: AC
Start: 2016-12-22 — End: 2016-12-22
  Administered 2016-12-22: 10 mL via PERINEURAL

## 2016-12-22 MED ORDER — TRIAMCINOLONE ACETONIDE 40 MG/ML IJ SUSP
INTRAMUSCULAR | Status: AC
  Filled 2016-12-22: qty 1

## 2016-12-22 MED ORDER — FENTANYL CITRATE (PF) 100 MCG/2ML IJ SOLN
25.0000 ug | INTRAMUSCULAR | Status: DC | PRN
Start: 2016-12-22 — End: 2016-12-22

## 2016-12-22 MED ORDER — MIDAZOLAM HCL 5 MG/5ML IJ SOLN
INTRAMUSCULAR | Status: AC
  Filled 2016-12-22: qty 5

## 2016-12-22 MED ORDER — LIDOCAINE HCL (PF) 1.5 % IJ SOLN
20.0000 mL | Freq: Once | INTRAMUSCULAR | Status: DC
  Filled 2016-12-22: qty 20

## 2016-12-22 NOTE — Patient Instructions (Signed)

## 2016-12-22 NOTE — Progress Notes (Signed)
Patient's Name: Tiffany Hunter  MRN: [id number]  Referring Provider: Iva Lento, PA-C  DOB: 01/02/1946  PCP: Iva Lento, PA-C  DOS: 12/22/2016  Note by: Gaspar Cola, MD  Service setting: Ambulatory outpatient  Specialty: Interventional Pain Management  Patient type: Established  Location: [facility] (AMB) Pain Management Facility  Visit type: Interventional Procedure   Primary Reason for Visit: Interventional Pain Management Treatment. CC: Back Pain (low, mid)  Procedure:  Anesthesia, Analgesia, Anxiolysis:  Type: Diagnostic Medial Branch Facet Block Region: Lumbar Level: L2, L3, L4, L5, & S1 Medial Branch Level(s) Laterality: Left  Type: Local Anesthesia with Moderate (Conscious) Sedation Local Anesthetic: Lidocaine 1% Route: Intravenous (IV) IV Access: Secured Sedation: Meaningful verbal contact was maintained at all times during the procedure  Indication(s): Analgesia and Anxiety  Indications: 1. Lumbar facet joint syndrome (Left)   2. Lumbar spondylosis   3. 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 is a 71 y.o. (year old), female patient, seen today for interventional treatment. She  has a past surgical history that includes Carpal tunnel release; Back surgery; Breast surgery (Bilateral); Cholecystectomy; Colonoscopy w/ polypectomy; Cataract extraction w/ intraocular lens implant (Right); Fracture surgery (Right); and Abdominal hysterectomy. [name] has a current medication list which includes the following prescription(s): albuterol, aspirin, atorvastatin, carvedilol, tramadol, and PAIN MANAGEMENT IT PUMP REFILL, and the following Facility-Administered Medications: fentanyl, lactated ringers, lidocaine, and midazolam. Her primarily concern today is the Back Pain (low, mid)  Initial Vital Signs: There were no vitals taken for this visit. BMI: Estimated  body mass index is 32.92 kg/m as calculated from the following:   Height as of this encounter: 5\' 2"  (1.575 m).   Weight as of this encounter: 180 lb (81.6 kg).  Risk Assessment: Allergies: Reviewed. She is allergic to doxycycline hyclate; sulfa antibiotics; and sulfamethoxazole-trimethoprim.  Allergy Precautions: None required Coagulopathies: Reviewed. None identified.  Blood-thinner therapy: None at this time Active Infection(s): Reviewed. None identified. [name] is afebrile  Site Confirmation: [name] was asked to confirm the procedure and laterality before marking the site Procedure checklist: Completed Consent: Before the procedure and under the influence of no sedative(s), amnesic(s), or anxiolytics, the patient was informed of the treatment options, risks and possible complications. To fulfill our ethical and legal obligations, as recommended by the American Medical Association's Code of Ethics, I have informed the patient of my clinical impression; the nature and purpose of the treatment or procedure; the risks, benefits, and possible complications of the intervention; the alternatives, including doing nothing; the risk(s) and benefit(s) of the alternative treatment(s) or procedure(s); and the risk(s) and benefit(s) of doing nothing. The patient was provided information about the general risks and possible complications associated with the procedure. These may include, but are not limited to: failure to achieve desired goals, infection, bleeding, organ or nerve damage, allergic reactions, paralysis, and death. In addition, the patient was informed of those risks and complications associated to Spine-related procedures, such as failure to decrease pain; infection (i.e.: Meningitis, epidural or intraspinal abscess); bleeding (i.e.: epidural hematoma, subarachnoid hemorrhage, or any other type of intraspinal or peri-dural bleeding); organ or nerve damage (i.e.: Any type of peripheral  nerve, nerve root, or spinal cord injury) with subsequent damage to sensory, motor, and/or autonomic systems, resulting in permanent pain, numbness, and/or weakness of one or several areas of the body; allergic reactions; (i.e.: anaphylactic reaction); and/or death. Furthermore, the patient  was informed of those risks and complications associated with the medications. These include, but are not limited to: allergic reactions (i.e.: anaphylactic or anaphylactoid reaction(s)); adrenal axis suppression; blood sugar elevation that in diabetics may result in ketoacidosis or comma; water retention that in patients with history of congestive heart failure may result in shortness of breath, pulmonary edema, and decompensation with resultant heart failure; weight gain; swelling or edema; medication-induced neural toxicity; particulate matter embolism and blood vessel occlusion with resultant organ, and/or nervous system infarction; and/or aseptic necrosis of one or more joints. Finally, the patient was informed that Medicine is not an exact science; therefore, there is also the possibility of unforeseen or unpredictable risks and/or possible complications that may result in a catastrophic outcome. The patient indicated having understood very clearly. We have given the patient no guarantees and we have made no promises. Enough time was given to the patient to ask questions, all of which were answered to the patient's satisfaction. [name] has indicated that she wanted to continue with the procedure. Attestation: I, the ordering provider, attest that I have discussed with the patient the benefits, risks, side-effects, alternatives, likelihood of achieving goals, and potential problems during recovery for the procedure that I have provided informed consent. Date: 12/22/2016; Time: 8:04 AM  Pre-Procedure Preparation:  Monitoring: As per clinic protocol. Respiration, ETCO2, SpO2, BP, heart rate and rhythm monitor placed  and checked for adequate function Safety Precautions: Patient was assessed for positional comfort and pressure points before starting the procedure. Time-out: I initiated and conducted the "Time-out" before starting the procedure, as per protocol. The patient was asked to participate by confirming the accuracy of the "Time Out" information. Verification of the correct person, site, and procedure were performed and confirmed by me, the nursing staff, and the patient. "Time-out" conducted as per Joint Commission's Universal Protocol (UP.01.01.01). "Time-out" Date & Time: 12/22/2016; 1005 hrs.  Description of Procedure Process:   Position: Prone Target Area: For Lumbar Facet blocks, the target is the groove formed by the junction of the transverse process and superior articular process. For the L5 dorsal ramus, the target is the notch between superior articular process and sacral ala. For the S1 dorsal ramus, the target is the superior and lateral edge of the posterior S1 Sacral foramen. Approach: Paramedial approach. Area Prepped: Entire Posterior Lumbosacral Region Prepping solution: ChloraPrep (2% chlorhexidine gluconate and 70% isopropyl alcohol) Safety Precautions: Aspiration looking for blood return was conducted prior to all injections. At no point did we inject any substances, as a needle was being advanced. No attempts were made at seeking any paresthesias. Safe injection practices and needle disposal techniques used. Medications properly checked for expiration dates. SDV (single dose vial) medications used. Description of the Procedure: Protocol guidelines were followed. The patient was placed in position over the fluoroscopy table. The target area was identified and the area prepped in the usual manner. Skin desensitized using vapocoolant spray. Skin & deeper tissues infiltrated with local anesthetic. Appropriate amount of time allowed to pass for local anesthetics to take effect. The procedure  needle was introduced through the skin, ipsilateral to the reported pain, and advanced to the target area. Employing the "Medial Branch Technique", the needles were advanced to the angle made by the superior and medial portion of the transverse process, and the lateral and inferior portion of the superior articulating process of the targeted vertebral bodies. This area is known as "Burton's Eye" or the "Eye of the [place] Dog". A procedure needle  was introduced through the skin, and this time advanced to the angle made by the superior and medial border of the sacral ala, and the lateral border of the S1 vertebral body. This last needle was later repositioned at the superior and lateral border of the posterior S1 foramen. Negative aspiration confirmed. Solution injected in intermittent fashion, asking for systemic symptoms every 0.5cc of injectate. The needles were then removed and the area cleansed, making sure to leave some of the prepping solution back to take advantage of its long term bactericidal properties.   Illustration of the posterior view of the lumbar spine and the posterior neural structures. Laminae of L2 through S1 are labeled. DPRL5, dorsal primary ramus of L5; DPRS1, dorsal primary ramus of S1; DPR3, dorsal primary ramus of L3; FJ, facet (zygapophyseal) joint L3-L4; I, inferior articular process of L4; LB1, lateral branch of dorsal primary ramus of L1; IAB, inferior articular branches from L3 medial branch (supplies L4-L5 facet joint); IBP, intermediate branch plexus; MB3, medial branch of dorsal primary ramus of L3; NR3, third lumbar nerve root; S, superior articular process of L5; SAB, superior articular branches from L4 (supplies L4-5 facet joint also); TP3, transverse process of L3.  Vitals:   12/22/16 1022 12/22/16 1032 12/22/16 1042 12/22/16 1052  BP: (!) 170/74 (!) 162/59 (!) 165/74 (!) 154/74  Pulse:      Resp: 12 12 16 16   Temp:      SpO2: 93% 93% 95% 95%  Weight:      Height:         Start Time: 1005 hrs. End Time: 1014 hrs. Materials:  Needle(s) Type: Regular needle Gauge: 22G Length: 3.5-in Medication(s): We administered lactated ringers, midazolam, triamcinolone acetonide, and ropivacaine (PF) 2 mg/mL (0.2%). Please see chart orders for dosing details.  Imaging Guidance (Spinal):  Type of Imaging Technique: Fluoroscopy Guidance (Spinal) Indication(s): Assistance in needle guidance and placement for procedures requiring needle placement in or near specific anatomical locations not easily accessible without such assistance. Exposure Time: Please see nurses notes. Contrast: None used. Fluoroscopic Guidance: I was personally present during the use of fluoroscopy. "Tunnel Vision Technique" used to obtain the best possible view of the target area. Parallax error corrected before commencing the procedure. "Direction-depth-direction" technique used to introduce the needle under continuous pulsed fluoroscopy. Once target was reached, antero-posterior, oblique, and lateral fluoroscopic projection used confirm needle placement in all planes. Images permanently stored in EMR. Interpretation: No contrast injected. I personally interpreted the imaging intraoperatively. Adequate needle placement confirmed in multiple planes. Permanent images saved into the patient's record.  Antibiotic Prophylaxis:  Indication(s): None identified Antibiotic given: None  Post-operative Assessment:  EBL: None Complications: No immediate post-treatment complications observed by team, or reported by patient. Note: The patient tolerated the entire procedure well. A repeat set of vitals were taken after the procedure and the patient was kept under observation following institutional policy, for this type of procedure. Post-procedural neurological assessment was performed, showing return to baseline, prior to discharge. The patient was provided with post-procedure discharge instructions, including a  section on how to identify potential problems. Should any problems arise concerning this procedure, the patient was given instructions to immediately contact us, at any time, without hesitation. In any case, we plan to contact the patient by telephone for a follow-up status report regarding this interventional procedure. Comments:  No additional relevant information.  Plan of Care  Disposition: Discharge home  Discharge Date & Time: 12/22/2016; 1055 hrs.  Physician-requested Follow-up:  Return  for post-procedure eval by Dr. Dossie Arbour in 2 weeks.  New Prescriptions   No medications on file   Future Appointments Date Time Provider [place]  01/25/2017 10:15 AM Milinda Pointer, MD [facility] None  02/13/2017 11:45 AM Milinda Pointer, MD [facility] None    Imaging Orders     DG C-Arm 1-60 Min-No Report  Procedure Orders     LUMBAR FACET(MEDIAL BRANCH NERVE BLOCK) MBNB Medications ordered for procedure: Meds ordered this encounter  Medications  . lactated ringers infusion 1,000 mL  . midazolam (VERSED) 5 MG/5ML injection 1-2 mg    Make sure Flumazenil is available in the pyxis when using this medication. If oversedation occurs, administer 0.2 mg IV over 15 sec. If after 45 sec no response, administer 0.2 mg again over 1 min; may repeat at 1 min intervals; not to exceed 4 doses (1 mg)  . fentaNYL (SUBLIMAZE) injection 25-50 mcg    Make sure Narcan is available in the pyxis when using this medication. In the event of respiratory depression (RR< 8/min): Titrate NARCAN (naloxone) in increments of 0.1 to 0.2 mg IV at 2-3 minute intervals, until desired degree of reversal.  . lidocaine 1.5 % injection 20 mL    From block tray  . triamcinolone acetonide (KENALOG-40) injection 40 mg  . ropivacaine (PF) 2 mg/mL (0.2%) (NAROPIN) injection 9 mL   Medications administered: We administered lactated ringers, midazolam, triamcinolone acetonide, and ropivacaine (PF) 2 mg/mL (0.2%).  See the  medical record for exact dosing, route, and time of administration.  Primary Care Physician: Iva Lento, PA-C Location: [facility] Note by: Gaspar Cola, MD Date: 12/22/2016; Time: 10:58 AM  Disclaimer:  Medicine is not an exact science. The only guarantee in medicine is that nothing is guaranteed. It is important to note that the decision to proceed with this intervention was based on the information collected from the patient. The Data and conclusions were drawn from the patient's questionnaire, the interview, and the physical examination. Because the information was provided in large part by the patient, it cannot be guaranteed that it has not been purposely or unconsciously manipulated. Every effort has been made to obtain as much relevant data as possible for this evaluation. It is important to note that the conclusions that lead to this procedure are derived in large part from the available data. Always take into account that the treatment will also be dependent on availability of resources and existing treatment guidelines, considered by other Pain Management Practitioners as being common knowledge and practice, at the time of the intervention. For Medico-Legal purposes, it is also important to point out that variation in procedural techniques and pharmacological choices are the acceptable norm. The indications, contraindications, technique, and results of the above procedure should only be interpreted and judged by a Board-Certified Interventional Pain Specialist with extensive familiarity and expertise in the same exact procedure and technique.

## 2016-12-22 NOTE — Progress Notes (Signed)
Safety precautions to be maintained throughout the outpatient stay will include: orient to surroundings, keep bed in low position, maintain call bell within reach at all times, provide assistance with transfer out of bed and ambulation.  

## 2016-12-23 ENCOUNTER — Telehealth: Payer: Self-pay

## 2016-12-23 NOTE — Telephone Encounter (Signed)
Post procedure phone call. States she is pain free and doing well.

## 2017-01-02 DIAGNOSIS — N3941 Urge incontinence: Secondary | ICD-10-CM | POA: Insufficient documentation

## 2017-01-25 ENCOUNTER — Encounter: Payer: Self-pay | Admitting: Pain Medicine

## 2017-01-25 ENCOUNTER — Ambulatory Visit: Payer: Medicare Other | Attending: Pain Medicine | Admitting: Pain Medicine

## 2017-01-25 VITALS — BP 139/92 | HR 64 | Temp 98.7°F | Resp 18 | Ht 62.0 in | Wt 180.0 lb

## 2017-01-25 DIAGNOSIS — M4726 Other spondylosis with radiculopathy, lumbar region: Secondary | ICD-10-CM | POA: Diagnosis not present

## 2017-01-25 DIAGNOSIS — I2510 Atherosclerotic heart disease of native coronary artery without angina pectoris: Secondary | ICD-10-CM | POA: Insufficient documentation

## 2017-01-25 DIAGNOSIS — M1612 Unilateral primary osteoarthritis, left hip: Secondary | ICD-10-CM | POA: Diagnosis not present

## 2017-01-25 DIAGNOSIS — M5442 Lumbago with sciatica, left side: Secondary | ICD-10-CM | POA: Diagnosis not present

## 2017-01-25 DIAGNOSIS — N3941 Urge incontinence: Secondary | ICD-10-CM | POA: Diagnosis not present

## 2017-01-25 DIAGNOSIS — Z9689 Presence of other specified functional implants: Secondary | ICD-10-CM

## 2017-01-25 DIAGNOSIS — M1712 Unilateral primary osteoarthritis, left knee: Secondary | ICD-10-CM | POA: Diagnosis not present

## 2017-01-25 DIAGNOSIS — G894 Chronic pain syndrome: Secondary | ICD-10-CM | POA: Diagnosis not present

## 2017-01-25 DIAGNOSIS — Z79891 Long term (current) use of opiate analgesic: Secondary | ICD-10-CM

## 2017-01-25 DIAGNOSIS — M47816 Spondylosis without myelopathy or radiculopathy, lumbar region: Secondary | ICD-10-CM | POA: Diagnosis not present

## 2017-01-25 DIAGNOSIS — M545 Low back pain: Secondary | ICD-10-CM | POA: Diagnosis present

## 2017-01-25 DIAGNOSIS — M25552 Pain in left hip: Secondary | ICD-10-CM | POA: Diagnosis not present

## 2017-01-25 DIAGNOSIS — M79605 Pain in left leg: Secondary | ICD-10-CM | POA: Diagnosis not present

## 2017-01-25 DIAGNOSIS — M5416 Radiculopathy, lumbar region: Secondary | ICD-10-CM

## 2017-01-25 DIAGNOSIS — R531 Weakness: Secondary | ICD-10-CM | POA: Diagnosis not present

## 2017-01-25 DIAGNOSIS — I2721 Secondary pulmonary arterial hypertension: Secondary | ICD-10-CM | POA: Insufficient documentation

## 2017-01-25 DIAGNOSIS — I120 Hypertensive chronic kidney disease with stage 5 chronic kidney disease or end stage renal disease: Secondary | ICD-10-CM | POA: Diagnosis not present

## 2017-01-25 DIAGNOSIS — G8929 Other chronic pain: Secondary | ICD-10-CM

## 2017-01-25 DIAGNOSIS — E1122 Type 2 diabetes mellitus with diabetic chronic kidney disease: Secondary | ICD-10-CM | POA: Diagnosis not present

## 2017-01-25 DIAGNOSIS — N189 Chronic kidney disease, unspecified: Secondary | ICD-10-CM | POA: Diagnosis not present

## 2017-01-25 DIAGNOSIS — M533 Sacrococcygeal disorders, not elsewhere classified: Secondary | ICD-10-CM | POA: Diagnosis not present

## 2017-01-25 DIAGNOSIS — M25562 Pain in left knee: Secondary | ICD-10-CM

## 2017-01-25 DIAGNOSIS — Z978 Presence of other specified devices: Secondary | ICD-10-CM

## 2017-01-25 DIAGNOSIS — M4696 Unspecified inflammatory spondylopathy, lumbar region: Secondary | ICD-10-CM | POA: Diagnosis not present

## 2017-01-25 NOTE — Progress Notes (Signed)
Safety precautions to be maintained throughout the outpatient stay will include: orient to surroundings, keep bed in low position, maintain call bell within reach at all times, provide assistance with transfer out of bed and ambulation.  

## 2017-01-25 NOTE — Progress Notes (Signed)
Patient's Name: Tiffany Hunter  MRN: [id number]  Referring Provider: Iva Lento, PA-C  DOB: [birth year]  PCP: Iva Lento, PA-C  DOS: 01/25/2017  Note by: Gaspar Cola, MD  Service setting: Ambulatory outpatient  Specialty: Interventional Pain Management  Location: [facility] (AMB) Pain Management Facility    Patient type: Established   Primary Reason(s) for Visit: Encounter for post-procedure evaluation of chronic illness with mild to moderate exacerbation CC: Back Pain (lower)  HPI  Tiffany Hunter is a 71 y.o. year old, female patient, who comes today for a post-procedure evaluation. 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); Long term current use of opiate analgesic; Opiate use (586.8 MME/Day); Encounter for adjustment or management of infusion pump; Atypical chest pain; Arteriosclerosis of coronary artery; Carotid artery bruit; Chronic kidney disease (CKD), stage II (mild); Diabetes mellitus ([place]); Breathlessness on exertion; Benign hypertension; Combined fat and carbohydrate induced hyperlipemia; Chronic low back pain (Primary Area of Pain) (Left); Osteoarthritis; Lumbar spondylosis; 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); Chronic lumbar radicular pain (L4 Dermatome) (Left); Coccygodynia; Chronic hip pain (Left); Osteoarthritis of hip (Left); Chronic knee pain (Left); Osteoarthritis of knee (Left); Thoracic bulging disc without myelopathy; Generalized weakness; Disturbance of skin sensation; Chronic lower extremity pain (Secondary Area of pain) (Left); Chronic sacroiliac joint pain [facility] source of pain) (Left); Mammographic microcalcification found on diagnostic imaging of breast; Swelling; Pulmonary arterial hypertension ([place]); Gastroenteritis; Generalized abdominal pain; Right elbow pain; Opioid-induced constipation (OIC); Lumbar  facet joint syndrome (Left); and Urge incontinence on her problem list. Her primarily concern today is the Back Pain (lower)  Pain Assessment: Location: Lower Back Radiating: left leg to the knee Onset: More than a month ago Duration: Chronic pain Quality: Aching, Sharp, Stabbing, Throbbing Severity: 0-No pain/10 (self-reported pain score)  Note: Reported level is compatible with observation.                   Effect on ADL: have to take things slow Timing: Constant Modifying factors: rest, heat  Tiffany Hunter comes in today for post-procedure evaluation after the treatment done on 12/22/2016. The patient comes in today clinics today indicating name 100% relief of the pain from the diagnostic left sided lumbar facet block. Unfortunately, this patient is always in a hurry and despite the fact that we have informed patient that it is a HIPAA violation, she is in the habit of walking out of her exam room into areas where on patients are being examined, so as to tell us that she has some other type of compromise for which she is late and wants to be seen immediately so that she can get out of the clinic. Unfortunately, this creates a situation where she is not getting the full benefit of the appointment since she does not stick around long enough to allow me to provide her with detailed information. This issue has been addressed with the patient several times and we have even offered to refer her to a practice closer to where she lives so that she does not complain of such a long drive. However, she always returns indicating that she prefers to stay here. The interesting thing here is that for a long time she turned down all of our offers to manage her pain with interventional techniques or other than her pump. We have to keep in mind that this pump was not implemented by [place] and therefore the indications  for which was implanted were selected by a different physician. This is also true for the medications in  the pump. For the longest time this patient has been resistant to changes in her therapy despite the fact that we have informed her of benefits associated with a recommendations. This is a good example of the types of benefits that she can't attain by following are recommendations. At this point she comes in indicating that her pain score is a 0/10.  Further details on both, my assessment(s), as well as the proposed treatment plan, please see below.  Post-Procedure Assessment  12/22/2016 Procedure: Diagnostic left sided lumbar facet block #1 under fluoroscopic guidance and IV sedation  Pre-procedure pain score:  5/10 Post-procedure pain score: 0/10 (100% relief) Influential Factors: BMI: 32.92 kg/m Intra-procedural challenges: None observed.         Assessment challenges: None detected.              Reported side-effects: None.        Post-procedural adverse reactions or complications: None reported         Sedation: Sedation provided. When no sedatives are used, the analgesic levels obtained are directly associated to the effectiveness of the local anesthetics. However, when sedation is provided, the level of analgesia obtained during the initial 1 hour following the intervention, is believed to be the result of a combination of factors. These factors may include, but are not limited to: 1. The effectiveness of the local anesthetics used. 2. The effects of the analgesic(s) and/or anxiolytic(s) used. 3. The degree of discomfort experienced by the patient at the time of the procedure. 4. The patients ability and reliability in recalling and recording the events. 5. The presence and influence of possible secondary gains and/or psychosocial factors. Reported result: Relief experienced during the 1st hour after the procedure: 100 % (Ultra-Short Term Relief)            Interpretative annotation: Clinically appropriate result. Analgesia during this period is likely to be Local Anesthetic and/or IV  Sedative (Analgesic/Anxiolytic) related.          Effects of local anesthetic: The analgesic effects attained during this period are directly associated to the localized infiltration of local anesthetics and therefore cary significant diagnostic value as to the etiological location, or anatomical origin, of the pain. Expected duration of relief is directly dependent on the pharmacodynamics of the local anesthetic used. Long-acting (4-6 hours) anesthetics used.  Reported result: Relief during the next 4 to 6 hour after the procedure: 100 % (Short-Term Relief)            Interpretative annotation: Clinically appropriate result. Analgesia during this period is likely to be Local Anesthetic-related.          Long-term benefit: Defined as the period of time past the expected duration of local anesthetics (1 hour for short-acting and 4-6 hours for long-acting). With the possible exception of prolonged sympathetic blockade from the local anesthetics, benefits during this period are typically attributed to, or associated with, other factors such as analgesic sensory neuropraxia, antiinflammatory effects, or beneficial biochemical changes provided by agents other than the local anesthetics.  Reported result: Extended relief following procedure: 100 % (Long-Term Relief) Ms. Arca reports that both, extremity and the axial pain improved with the treatment. Interpretative annotation: Clinically appropriate result. Good relief. No permanent benefit expected. Inflammation plays a part in the etiology to the pain.          Current benefits: Defined as persistent  relief that continues at this point in time.   Reported results: Treated area: 100 % Ms. Prothero reports improvement in function Interpretative annotation: Recurrence of symptoms. No permanent benefit expected. Effective therapeutic approach. Benefit believed to be steroid-related.  Interpretation: Results would suggest a successful diagnostic  intervention.                  Plan:  Set up procedure as a PRN palliative treatment option for this patient.  Laboratory Chemistry  Inflammation Markers (CRP: Acute Phase) (ESR: Chronic Phase) Lab Results  Component Value Date   CRP 0.9 10/29/2015   ESRSEDRATE 16 10/29/2015                 Renal Function Markers Lab Results  Component Value Date   BUN 9 10/29/2015   CREATININE 0.45 10/29/2015   GFRAA >60 10/29/2015   GFRNONAA >60 10/29/2015                 Hepatic Function Markers Lab Results  Component Value Date   AST 34 10/29/2015   ALT 23 10/29/2015   ALBUMIN 4.2 10/29/2015   ALKPHOS 69 10/29/2015                 Electrolytes Lab Results  Component Value Date   NA 140 10/29/2015   K 3.7 10/29/2015   CL 101 10/29/2015   CALCIUM 8.9 10/29/2015   MG 1.8 10/29/2015                 Neuropathy Markers Lab Results  Component Value Date   VITAMINB12 329 10/29/2015                 Bone Pathology Markers Lab Results  Component Value Date   ALKPHOS 69 10/29/2015   25OHVITD1 32 10/29/2015   25OHVITD2 18 10/29/2015   25OHVITD3 14 10/29/2015   CALCIUM 8.9 10/29/2015                 Coagulation Parameters Lab Results  Component Value Date   INR 1.1 (H) 12/16/2010   LABPROT 12.1 12/16/2010   PLT 161 10/29/2015                 Cardiovascular Markers Lab Results  Component Value Date   BNP 53.0 10/29/2015   HGB 13.5 10/29/2015   HCT 40.4 10/29/2015                 Note: Lab results reviewed.  Recent Diagnostic Imaging Review  Dg C-arm 1-60 Min-no Report  Result Date: 12/22/2016 Fluoroscopy was utilized by the requesting physician.  No radiographic interpretation.   Note: Imaging results reviewed.          Meds   Current Outpatient Prescriptions:  .  albuterol (PROVENTIL HFA;VENTOLIN HFA) 108 (90 Base) MCG/ACT inhaler, Inhale into the lungs., Disp: , Rfl:  .  aspirin 81 MG tablet, Take 81 mg by mouth daily., Disp: , Rfl:  .  atorvastatin  (LIPITOR) 20 MG tablet, , Disp: , Rfl:  .  carvedilol (COREG) 3.125 MG tablet, Take 3.125 mg by mouth 2 (two) times daily with a meal. , Disp: , Rfl:  .  hydrocortisone (PROCTOSOL HC) 2.5 % rectal cream, , Disp: , Rfl:  .  oxybutynin (DITROPAN) 5 MG tablet, Take 5 mg by mouth., Disp: , Rfl:  .  PAIN MANAGEMENT IT PUMP REFILL, 1 each by Intrathecal route once. Medication: PF Hydromorphone 8.0 mg/ml PF Sufentanil 70.0 mcg/ml   Total Volume:  40 ml Needed by 12-07-16, Disp: 1 each, Rfl: 0  ROS  Constitutional: Denies any fever or chills Gastrointestinal: No reported hemesis, hematochezia, vomiting, or acute GI distress Musculoskeletal: Denies any acute onset joint swelling, redness, loss of ROM, or weakness Neurological: No reported episodes of acute onset apraxia, aphasia, dysarthria, agnosia, amnesia, paralysis, loss of coordination, or loss of consciousness  Allergies  Ms. Willemsen is allergic to doxycycline hyclate; sulfa antibiotics; and sulfamethoxazole-trimethoprim.  PFSH  Drug: Ms. Newcombe  reports that she does not use drugs. Alcohol:  reports that she does not drink alcohol. Tobacco:  reports that she has never smoked. She has never used smokeless tobacco. Medical:  has a past medical history of Abnormal EKG; Arthritis; Asthma; Backhand tennis elbow (11/20/2012); Barrett's esophagus; CAD (coronary artery disease); Cancer ([place]); Carotid artery bruit; Cataract; Chest wall pain; Colonic polyp; Difficult or painful urination (01/21/2014); Exogenous obesity; GERD (gastroesophageal reflux disease); H/O: hysterectomy; Heart murmur; Hyperlipidemia; Knee pain; Low back pain; Menopausal symptom (05/15/2015); Osteoporosis; Ovarian failure; Presence of functional implant (Medtronic programmable intrathecal pump) (03/20/2015); Sinusitis; and Weakness. Surgical: Ms. Alcivar  has a past surgical history that includes Carpal tunnel release; Back surgery; Breast surgery (Bilateral); Cholecystectomy; Colonoscopy  w/ polypectomy; Cataract extraction w/ intraocular lens implant (Right); Fracture surgery (Right); and Abdominal hysterectomy. Family: family history includes Cancer in her mother; Colon cancer in her unknown relative; Coronary artery disease in her unknown relative; Heart disease in her father.  Constitutional Exam  General appearance: Well nourished, well developed, and well hydrated. In no apparent acute distress Vitals:   01/25/17 1110  BP: (!) 139/92  Pulse: 64  Resp: 18  Temp: 98.7 F (37.1 C)  TempSrc: Oral  SpO2: 93%  Weight: 180 lb (81.6 kg)  Height: '5\' 2"'  (1.575 m)   BMI Assessment: Estimated body mass index is 32.92 kg/m as calculated from the following:   Height as of this encounter: '5\' 2"'  (1.575 m).   Weight as of this encounter: 180 lb (81.6 kg).  BMI interpretation table: BMI level Category Range association with higher incidence of chronic pain  <18 kg/m2 Underweight   18.5-24.9 kg/m2 Ideal body weight   25-29.9 kg/m2 Overweight Increased incidence by 20%  30-34.9 kg/m2 Obese (Class I) Increased incidence by 68%  35-39.9 kg/m2 Severe obesity (Class II) Increased incidence by 136%  >40 kg/m2 Extreme obesity (Class III) Increased incidence by 254%   BMI Readings from Last 4 Encounters:  01/25/17 32.92 kg/m  12/22/16 32.92 kg/m  12/14/16 32.92 kg/m  12/07/16 32.92 kg/m   Wt Readings from Last 4 Encounters:  01/25/17 180 lb (81.6 kg)  12/22/16 180 lb (81.6 kg)  12/14/16 180 lb (81.6 kg)  12/07/16 180 lb (81.6 kg)  Psych/Mental status: Alert, oriented x 3 (person, place, & time)       Eyes: PERLA Respiratory: No evidence of acute respiratory distress  Cervical Spine Area Exam  Skin & Axial Inspection: No masses, redness, edema, swelling, or associated skin lesions Alignment: Symmetrical Functional ROM: Unrestricted ROM      Stability: No instability detected Muscle Tone/Strength: Functionally intact. No obvious neuro-muscular anomalies  detected. Sensory (Neurological): Unimpaired Palpation: No palpable anomalies              Upper Extremity (UE) Exam    Side: Right upper extremity  Side: Left upper extremity  Skin & Extremity Inspection: Skin color, temperature, and hair growth are WNL. No peripheral edema or cyanosis. No masses, redness, swelling, asymmetry, or associated skin lesions. No  contractures.  Skin & Extremity Inspection: Skin color, temperature, and hair growth are WNL. No peripheral edema or cyanosis. No masses, redness, swelling, asymmetry, or associated skin lesions. No contractures.  Functional ROM: Unrestricted ROM          Functional ROM: Unrestricted ROM          Muscle Tone/Strength: Functionally intact. No obvious neuro-muscular anomalies detected.  Muscle Tone/Strength: Functionally intact. No obvious neuro-muscular anomalies detected.  Sensory (Neurological): Unimpaired          Sensory (Neurological): Unimpaired          Palpation: No palpable anomalies              Palpation: No palpable anomalies              Specialized Test(s): Deferred         Specialized Test(s): Deferred          Thoracic Spine Area Exam  Skin & Axial Inspection: No masses, redness, or swelling Alignment: Symmetrical Functional ROM: Unrestricted ROM Stability: No instability detected Muscle Tone/Strength: Functionally intact. No obvious neuro-muscular anomalies detected. Sensory (Neurological): Unimpaired Muscle strength & Tone: No palpable anomalies  Lumbar Spine Area Exam  Skin & Axial Inspection: No masses, redness, or swelling Alignment: Symmetrical Functional ROM: Improved after treatment      Stability: No instability detected Muscle Tone/Strength: Functionally intact. No obvious neuro-muscular anomalies detected. Sensory (Neurological): Unimpaired Palpation: No palpable anomalies       Provocative Tests: Lumbar Hyperextension and rotation test: evaluation deferred today       Lumbar Lateral bending test:  evaluation deferred today       Patrick's Maneuver: evaluation deferred today                    Gait & Posture Assessment  Ambulation: Unassisted Gait: Relatively normal for age and body habitus Posture: WNL   Lower Extremity Exam    Side: Right lower extremity  Side: Left lower extremity  Skin & Extremity Inspection: Skin color, temperature, and hair growth are WNL. No peripheral edema or cyanosis. No masses, redness, swelling, asymmetry, or associated skin lesions. No contractures.  Skin & Extremity Inspection: Skin color, temperature, and hair growth are WNL. No peripheral edema or cyanosis. No masses, redness, swelling, asymmetry, or associated skin lesions. No contractures.  Functional ROM: Unrestricted ROM          Functional ROM: Unrestricted ROM          Muscle Tone/Strength: Functionally intact. No obvious neuro-muscular anomalies detected.  Muscle Tone/Strength: Functionally intact. No obvious neuro-muscular anomalies detected.  Sensory (Neurological): Unimpaired  Sensory (Neurological): Unimpaired  Palpation: No palpable anomalies  Palpation: No palpable anomalies   Assessment  Primary Diagnosis & Pertinent Problem List: The primary encounter diagnosis was Chronic low back pain (Primary Area of Pain) (Bilateral) (L>R). 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  Status Diagnosis  Resolved Resolved Resolved 1. Chronic low back pain (Primary Area of Pain) (Bilateral) (L>R)   2. Lumbar facet joint  syndrome (Left)   3. Chronic lower extremity pain (Secondary Area  of pain) (Left)   4. Chronic sacroiliac joint pain [facility] source of pain) (Left)   5. Chronic lumbar radicular pain (L4 Dermatome) (Left)   6. Lumbar spondylosis   7. Chronic hip pain (Left)   8. Osteoarthritis of hip (Left)   9. Chronic knee pain (Left)   10. Osteoarthritis of knee (Left)   11. Chronic pain syndrome   12. Long term current use of opiate analgesic   13. 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 Scarring and skin bridge present from prior repair. No notable prolapse Also with DM, prior back surgery, and chronic narcotics. Any of these may contribute Start low dose meds and RTC in 4wks for f/u    Plan of Care  Pharmacotherapy (Medications Ordered): No orders of the defined types were placed in this encounter.  New Prescriptions   No medications on file   Medications administered today: Ms. Dorrance had no medications administered during this visit.   Procedure Orders     LUMBAR FACET(MEDIAL BRANCH NERVE BLOCK) [facility] Lab Orders  No laboratory test(s) ordered today   Imaging Orders  No imaging studies ordered today   Referral Orders  No referral(s) requested today    Interventional management options: Planned, scheduled, and/or pending:   Not at this time.   Considering:   Diagnostic left sided lumbar facet block #2  Possible left-sided lumbar facet RFA    Palliative PRN treatment(s):   Palliative Intrathecal pump refill as per programming  Diagnostic left sided lumbar facet block #2 under fluoroscopic guidance and IV sedation     Provider-requested follow-up: Return for Pump Refill (as per pump program).  Future Appointments Date Time Provider [place]  02/13/2017 11:45 AM Milinda Pointer, MD [employer] [facility] None   Primary Care Physician: Iva Lento, PA-C Location: [facility] Note by: Gaspar Cola, MD Date: 01/25/2017; Time: 12:13 PM

## 2017-01-25 NOTE — Progress Notes (Signed)
Results were reviewed and found to be: mildly abnormal  No acute injury or pathology identified  Review would suggest interventional pain management techniques may be of benefit 

## 2017-02-03 ENCOUNTER — Other Ambulatory Visit: Payer: Self-pay

## 2017-02-03 MED ORDER — PAIN MANAGEMENT IT PUMP REFILL: 1.0000 | Freq: Once | INTRATHECAL | 0 refills | Status: DC

## 2017-02-12 NOTE — Progress Notes (Addendum)
Patient's Name: Tiffany Hunter  MRN: [id number]  Referring Provider: Venetia Maxon, DO  DOB: 10-17-1945  PCP: Iva Lento, PA-C  DOS: 02/13/2017  Note by: Gaspar Cola, MD  Service setting: Ambulatory outpatient  Specialty: Interventional Pain Management  Patient type: Established  Location: [facility] (AMB) Pain Management Facility  Visit type: Interventional Procedure   Primary Reason for Visit: Interventional Pain Management Treatment. CC: Back Pain (low)  Procedure:  Intrathecal Drug Delivery System (IDDS):  Type: Reservoir Refill [phone]) No rate change Region: Abdominal Laterality: Left  Type of Pump: Medtronic Synchromed II (MRI-compatible) Delivery Route: Intrathecal Type of Pain Treated: Neuropathic/Nociceptive Primary Medication Class: Opioid/opiate  Medication, Concentration, Infusion Program, & Delivery Rate: Please see scanned programming printout.   Indications: 1. Chronic pain syndrome   2. Chronic low back pain (Primary Area of Pain) (Left)   3. Chronic lower extremity pain (Secondary Area of pain) (Left)   4. Chronic sacroiliac joint pain 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 Opiate use (586.8 MME/Day)   8. Encounter for adjustment or management of infusion pump    Pain Assessment: Self-Reported Pain Score: 0-No pain/10             Reported level is compatible with observation.        Intrathecal Pump Therapy Assessment  Manufacturer: Medtronic Synchromed II Type: Programmable Volume: 40 mL reservoir MRI compatibility: Yes   Drug content:  Primary Medication Class: Opioid Primary Medication: PF-Hydromorphone (Dilaudid) (8 mg/mL) Secondary Medication: PF-Sufentanil (70 g/mL) Other Medication: see pump readout   Programming:  Type: Simple continuous. See pump readout for details.   Changes:  Medication Change: None at  this point Rate Change: No change in rate  Reported side-effects or adverse reactions: None reported  Effectiveness: Described as relatively effective, allowing for increase in activities of daily living (ADL) Clinically meaningful improvement in function (CMIF): Sustained CMIF goals met  Plan: Pump refill today  Pre-op Assessment:  Tiffany Hunter is a 71 y.o. (year old), female patient, seen today for interventional treatment. She  has a past surgical history that includes Carpal tunnel release; Back surgery; Breast surgery (Bilateral); Cholecystectomy; Colonoscopy w/ polypectomy; Cataract extraction w/ intraocular lens implant (Right); Fracture surgery (Right); and Abdominal hysterectomy. Tiffany Hunter has a current medication list which includes the following prescription(s): albuterol, aspirin, atorvastatin, carvedilol, hydrocortisone, oxybutynin, and PAIN MANAGEMENT IT PUMP REFILL. Her primarily concern today is the Back Pain (low)  Initial Vital Signs: There were no vitals taken for this visit. BMI: Estimated body mass index is 32.92 kg/m as calculated from the following:   Height as of this encounter: '5\' 2"'  (1.575 m).   Weight as of this encounter: 180 lb (81.6 kg).  Risk Assessment: Allergies: Reviewed. She is allergic to doxycycline hyclate; sulfa antibiotics; and sulfamethoxazole-trimethoprim.  Allergy Precautions: None required Coagulopathies: Reviewed. None identified.  Blood-thinner therapy: None at this time Active Infection(s): Reviewed. None identified. Tiffany Hunter is afebrile  Site Confirmation: Tiffany Hunter was asked to confirm the procedure and laterality before marking the site Procedure checklist: Completed Consent: Before the procedure and under the influence of no sedative(s), amnesic(s), or anxiolytics, the patient was informed of the treatment options, risks and possible complications. To fulfill our ethical and legal obligations, as recommended by the American  Medical Association's Code of Ethics, I have informed the patient of my clinical impression; the nature  and purpose of the treatment or procedure; the risks, benefits, and possible complications of the intervention; the alternatives, including doing nothing; the risk(s) and benefit(s) of the alternative treatment(s) or procedure(s); and the risk(s) and benefit(s) of doing nothing.  Tiffany Hunter was provided with information about the general risks and possible complications associated with most interventional procedures. These include, but are not limited to: failure to achieve desired goals, infection, bleeding, organ or nerve damage, allergic reactions, paralysis, and/or death.  In addition, she was informed of those risks and possible complications associated to this particular procedure, which include, but are not limited to: damage to the implant; failure to decrease pain; local, systemic, or serious CNS infections, intraspinal abscess with possible cord compression and paralysis, or life-threatening such as meningitis; bleeding; organ damage; nerve injury or damage with subsequent sensory, motor, and/or autonomic system dysfunction, resulting in transient or permanent pain, numbness, and/or weakness of one or several areas of the body; allergic reactions, either minor or major life-threatening, such as anaphylactic or anaphylactoid reactions.  Furthermore, Tiffany Hunter was informed of those risks and complications associated with the medications. These include, but are not limited to: allergic reactions (i.e.: anaphylactic or anaphylactoid reactions); endorphine suppression; bradycardia and/or hypotension; water retention and/or peripheral vascular relaxation leading to lower extremity edema and possible stasis ulcers; respiratory depression and/or shortness of breath; decreased metabolic rate leading to weight gain; swelling or edema; medication-induced neural toxicity; particulate matter embolism and  blood vessel occlusion with resultant organ, and/or nervous system infarction; and/or intrathecal granuloma formation with possible spinal cord compression and permanent paralysis.  Before refilling the pump Ms. Fettes was informed that some of the medications used in the devise may not be FDA approved for such use and therefore it constitutes an off-label use of the medications.  Finally, she was informed that Medicine is not an exact science; therefore, there is also the possibility of unforeseen or unpredictable risks and/or possible complications that may result in a catastrophic outcome. The patient indicated having understood very clearly. We have given the patient no guarantees and we have made no promises. Enough time was given to the patient to ask questions, all of which were answered to the patient's satisfaction. Ms. Gaut has indicated that she wanted to continue with the procedure. Attestation: I, the ordering provider, attest that I have discussed with the patient the benefits, risks, side-effects, alternatives, likelihood of achieving goals, and potential problems during recovery for the procedure that I have provided informed consent. Date: 02/13/2017; Time: 6:55 PM  Pre-Procedure Preparation:  Monitoring: As per clinic protocol. Respiration, ETCO2, SpO2, BP, heart rate and rhythm monitor placed and checked for adequate function Safety Precautions: Patient was assessed for positional comfort and pressure points before starting the procedure. Time-out: I initiated and conducted the "Time-out" before starting the procedure, as per protocol. The patient was asked to participate by confirming the accuracy of the "Time Out" information. Verification of the correct person, site, and procedure were performed and confirmed by me, the nursing staff, and the patient. "Time-out" conducted as per Joint Commission's Universal Protocol (UP.01.01.01). "Time-out" Date & Time: 02/13/2017; 1058  hrs.  Description of Procedure Process:   Position: Supine Target Area: Central-port of intrathecal pump. Approach: Anterior, 90 degree angle approach. Area Prepped: Entire Area around the pump implant. Prepping solution: ChloraPrep (2% chlorhexidine gluconate and 70% isopropyl alcohol) Safety Precautions: Aspiration looking for blood return was conducted prior to all injections. At no point did we inject any substances, as a  needle was being advanced. No attempts were made at seeking any paresthesias. Safe injection practices and needle disposal techniques used. Medications properly checked for expiration dates. SDV (single dose vial) medications used. Description of the Procedure: Protocol guidelines were followed. Two nurses trained to do implant refills were present during the entire procedure. The refill medication was checked by both healthcare providers as well as the patient. The patient was included in the "Time-out" to verify the medication. The patient was placed in position. The pump was identified. The area was prepped in the usual manner. The sterile template was positioned over the pump, making sure the side-port location matched that of the pump. Both, the pump and the template were held for stability. The needle provided in the Medtronic Kit was then introduced thru the center of the template and into the central port. The pump content was aspirated and discarded volume documented. The new medication was slowly infused into the pump, thru the filter, making sure to avoid overpressure of the device. The needle was then removed and the area cleansed, making sure to leave some of the prepping solution back to take advantage of its long term bactericidal properties. The pump was interrogated and programmed to reflect the correct medication, volume, and dosage. The program was printed and taken to the physician for approval. Once checked and signed by the physician, a copy was provided to the  patient and another scanned into the EMR. Vitals:   02/13/17 1018  BP: (!) 120/58  Pulse: 77  Resp: 18  Temp: 98.7 F (37.1 C)  TempSrc: Oral  SpO2: 98%  Weight: 180 lb (81.6 kg)  Height: '5\' 2"'  (1.575 m)    Start Time: 1058 hrs. Materials & Medications: Medtronic Refill Kit Medication(s): Please see chart orders for details.  Imaging Guidance:  Type of Imaging Technique: None used Indication(s): N/A Exposure Time: No patient exposure Contrast: None used. Fluoroscopic Guidance: N/A Ultrasound Guidance: N/A Interpretation: N/A  Antibiotic Prophylaxis:  Indication(s): None identified Antibiotic given: None  Post-operative Assessment:  EBL: None Complications: No immediate post-treatment complications observed by team, or reported by patient. Note: The patient tolerated the entire procedure well. A repeat set of vitals were taken after the procedure and the patient was kept under observation following institutional policy, for this type of procedure. Post-procedural neurological assessment was performed, showing return to baseline, prior to discharge. The patient was provided with post-procedure discharge instructions, including a section on how to identify potential problems. Should any problems arise concerning this procedure, the patient was given instructions to immediately contact us, at any time, without hesitation. In any case, we plan to contact the patient by telephone for a follow-up status report regarding this interventional procedure. Comments:  No additional relevant information.  Plan of Care  Disposition: Discharge home  Discharge Date & Time: 02/13/2017; 1121 hrs.   Physician-requested Follow-up:  Return for Pump Refill (as per pump program).  Future Appointments Date Time Provider [place]  04/25/2017 11:00 AM Milinda Pointer, MD [facility] None   Imaging Orders  No imaging studies ordered today    Procedure Orders     PUMP REFILL     PUMP  REFILL  Medications ordered for procedure: No orders of the defined types were placed in this encounter.  Medications administered: Ms. Christian had no medications administered during this visit.  See the medical record for exact dosing, route, and time of administration.  New Prescriptions   No medications on file   Primary Care  Physician: Iva Lento, PA-C Location: [facility] Note by: Gaspar Cola, MD Date: 02/13/2017; Time: 1:17 PM  Disclaimer:  Medicine is not an Chief Strategy Officer. The only guarantee in medicine is that nothing is guaranteed. It is important to note that the decision to proceed with this intervention was based on the information collected from the patient. The Data and conclusions were drawn from the patient's questionnaire, the interview, and the physical examination. Because the information was provided in large part by the patient, it cannot be guaranteed that it has not been purposely or unconsciously manipulated. Every effort has been made to obtain as much relevant data as possible for this evaluation. It is important to note that the conclusions that lead to this procedure are derived in large part from the available data. Always take into account that the treatment will also be dependent on availability of resources and existing treatment guidelines, considered by other Pain Management Practitioners as being common knowledge and practice, at the time of the intervention. For Medico-Legal purposes, it is also important to point out that variation in procedural techniques and pharmacological choices are the acceptable norm. The indications, contraindications, technique, and results of the above procedure should only be interpreted and judged by a Board-Certified Interventional Pain Specialist with extensive familiarity and expertise in the same exact procedure and technique.

## 2017-02-13 ENCOUNTER — Encounter: Payer: Self-pay | Admitting: Pain Medicine

## 2017-02-13 ENCOUNTER — Ambulatory Visit: Payer: Medicare Other | Attending: Pain Medicine | Admitting: Pain Medicine

## 2017-02-13 VITALS — BP 120/58 | HR 77 | Temp 98.7°F | Resp 18 | Ht 62.0 in | Wt 180.0 lb

## 2017-02-13 DIAGNOSIS — M545 Low back pain: Secondary | ICD-10-CM | POA: Diagnosis present

## 2017-02-13 DIAGNOSIS — G894 Chronic pain syndrome: Secondary | ICD-10-CM | POA: Diagnosis not present

## 2017-02-13 DIAGNOSIS — M79605 Pain in left leg: Secondary | ICD-10-CM | POA: Insufficient documentation

## 2017-02-13 DIAGNOSIS — Z79899 Other long term (current) drug therapy: Secondary | ICD-10-CM | POA: Insufficient documentation

## 2017-02-13 DIAGNOSIS — M533 Sacrococcygeal disorders, not elsewhere classified: Secondary | ICD-10-CM | POA: Insufficient documentation

## 2017-02-13 DIAGNOSIS — Z961 Presence of intraocular lens: Secondary | ICD-10-CM | POA: Diagnosis not present

## 2017-02-13 DIAGNOSIS — Z451 Encounter for adjustment and management of infusion pump: Secondary | ICD-10-CM | POA: Diagnosis not present

## 2017-02-13 DIAGNOSIS — M961 Postlaminectomy syndrome, not elsewhere classified: Secondary | ICD-10-CM | POA: Diagnosis not present

## 2017-02-13 DIAGNOSIS — Z7982 Long term (current) use of aspirin: Secondary | ICD-10-CM | POA: Insufficient documentation

## 2017-02-13 DIAGNOSIS — F1190 Opioid use, unspecified, uncomplicated: Secondary | ICD-10-CM

## 2017-02-13 DIAGNOSIS — Z9841 Cataract extraction status, right eye: Secondary | ICD-10-CM | POA: Insufficient documentation

## 2017-02-13 DIAGNOSIS — Z882 Allergy status to sulfonamides status: Secondary | ICD-10-CM | POA: Diagnosis not present

## 2017-02-13 DIAGNOSIS — Z881 Allergy status to other antibiotic agents status: Secondary | ICD-10-CM | POA: Insufficient documentation

## 2017-02-13 DIAGNOSIS — M5442 Lumbago with sciatica, left side: Secondary | ICD-10-CM

## 2017-02-13 DIAGNOSIS — Z79891 Long term (current) use of opiate analgesic: Secondary | ICD-10-CM | POA: Diagnosis not present

## 2017-02-13 DIAGNOSIS — Z978 Presence of other specified devices: Secondary | ICD-10-CM

## 2017-02-13 DIAGNOSIS — Z90710 Acquired absence of both cervix and uterus: Secondary | ICD-10-CM | POA: Diagnosis not present

## 2017-02-13 DIAGNOSIS — Z9049 Acquired absence of other specified parts of digestive tract: Secondary | ICD-10-CM | POA: Diagnosis not present

## 2017-02-13 DIAGNOSIS — G8929 Other chronic pain: Secondary | ICD-10-CM

## 2017-02-13 NOTE — Progress Notes (Signed)
Safety precautions to be maintained throughout the outpatient stay will include: orient to surroundings, keep bed in low position, maintain call bell within reach at all times, provide assistance with transfer out of bed and ambulation.  

## 2017-02-13 NOTE — Patient Instructions (Signed)
Opioid Overdose Opioids are substances that relieve pain by binding to pain receptors in your brain and spinal cord. Opioids include illegal drugs, such as heroin, as well as prescription pain medicines.An opioid overdose happens when you take too much of an opioid substance. This can happen with any type of opioid, including:  Heroin.  Morphine.  Codeine.  Methadone.  Oxycodone.  Hydrocodone.  Fentanyl.  Hydromorphone.  Buprenorphine.  The effects of an overdose can be mild, dangerous, or even deadly. Opioid overdose is a medical emergency. What are the causes? This condition may be caused by:  Taking too much of an opioid by accident.  Taking too much of an opioid on purpose.  An error made by a health care provider who prescribes a medicine.  An error made by the pharmacist who fills the prescription order.  Using more than one substance that contains opioids at the same time.  Mixing an opioid with a substance that affects your heart, breathing, or blood pressure. These include alcohol, tranquilizers, sleeping pills, illegal drugs, and some over-the-counter medicines.  What increases the risk? This condition is more likely in:  Children. They may be attracted to colorful pills. Because of a child's small size, even a small amount of a drug can be dangerous.  Elderly people. They may be taking many different drugs. Elderly people may have difficulty reading labels or remembering when they last took their medicine.  People who take an opioid on a long-term basis.  People who use: ? Illegal drugs. ? Other substances, including alcohol, while using an opioid.  People who have: ? A history of drug or alcohol abuse. ? Certain mental health conditions.  People who take opioids that are not prescribed for them.  What are the signs or symptoms? Symptoms of this condition depend on the type of opioid and the amount that was taken. Common symptoms  include:  Sleepiness or difficulty waking from sleep.  Confusion.  Slurred speech.  Slowed breathing and a slow pulse.  Nausea and vomiting.  Abnormally small pupils.  Signs and symptoms that require emergency treatment include:  Cold, clammy, and pale skin.  Blue lips and fingernails.  Vomiting.  Gurgling sounds in the throat.  A pulse that is very slow or difficult to detect.  Breathing that is very slow, noisy, or difficult to detect.  Limp body.  Inability to respond to speech or be awakened from sleep (stupor).  How is this diagnosed? This condition is diagnosed based on your symptoms. It is important to tell your health care provider:  All of the opioidsthat you took.  When you took the opioids.  Whether you were drinking alcohol or using other substances.  Your health care provider will do a physical exam. This exam may include:  Checking and monitoring your heart rate and rhythm, your breathing rate and depth, your temperature, and your blood pressure (vital signs).  Checking for abnormally small pupils.  Measuring oxygen levels in your blood.  You may also have blood tests or urine tests. How is this treated? Supporting your vital signs and your breathing is the first step in treating an opioid overdose. Treatment may also include:  Giving fluids and minerals (electrolytes) through an IV tube.  Inserting a breathing tube (endotracheal tube) in your airway to help you breathe.  Giving oxygen.  Passing a tube through your nose and into your stomach (NG tube, or nasogastric tube) to wash out your stomach.  Giving medicines that: ? Increase your   blood pressure. ? Absorb any opioid that is in your digestive system. ? Reverse the effects of the opioid (naloxone).  Ongoing counseling and mental health support if you intentionally overdosed or used an illegal drug.  Follow these instructions at home:  Take over-the-counter and prescription  medicines only as told by your health care provider. Always ask your health care provider about possible side effects and interactions of any new medicine that you start taking.  Keep a list of all of the medicines that you take, including over-the-counter medicines. Bring this list with you to all of your medical visits.  Drink enough fluid to keep your urine clear or pale yellow.  Keep all follow-up visits as told by your health care provider. This is important. How is this prevented?  Get help if you are struggling with: ? Alcohol or drug use. ? Depression or another mental health problem.  Keep the phone number of your local poison control center near your phone or on your cell phone.  Store all medicines in safety containers that are out of the reach of children.  Read the drug inserts that come with your medicines.  Do not drink alcohol when taking opioids.  Do not use illegal drugs.  Do not take opioid medicines that are not prescribed for you. Contact a health care provider if:  Your symptoms return.  You develop new symptoms or side effects when you are taking medicines. Get help right away if:  You think that you or someone else may have taken too much of an opioid. The hotline of the National Poison Control Center is (800) 222-1222.  You or someone else is having symptoms of an opioid overdose.  You have serious thoughts about hurting yourself or others.  You have: ? Chest pain. ? Difficulty breathing. ? A loss of consciousness. Opioid overdose is an emergency. Do not wait to see if the symptoms will go away. Get medical help right away. Call your local emergency services (911 in the [place] Do not drive yourself to the hospital. This information is not intended to replace advice given to you by your health care provider. Make sure you discuss any questions you have with your health care provider. Document Released: 06/09/2004 Document Revised: 10/08/2015 Document  Reviewed: 10/16/2014 Elsevier Interactive Patient Education  2018 [employer]  

## 2017-02-13 NOTE — Progress Notes (Signed)
IT pump refill done under sterile technique witnessed by D. [employer].  5ML wasted in sink witnessed by D. [employer].  Post procedure instructions given including signs and symptoms of drug overdose.  Patient and husband states understanding.

## 2017-02-15 MED FILL — Medication: INTRATHECAL | Qty: 1 | Status: AC

## 2017-03-15 IMAGING — CR DG SI JOINTS 3+V
1 series · 3 of 3 positions shown · non-contrast
Comparison: Abdomen films of 06/24/2014

CLINICAL DATA: Worsening left low back pain radiating to the left
leg with numbness and tingling

EXAM:
BILATERAL SACROILIAC JOINTS - 3+ VIEW

[Series 1: dg si joints · 0.14mm/px · 3 of 3 slices shown]
[im 1/3]
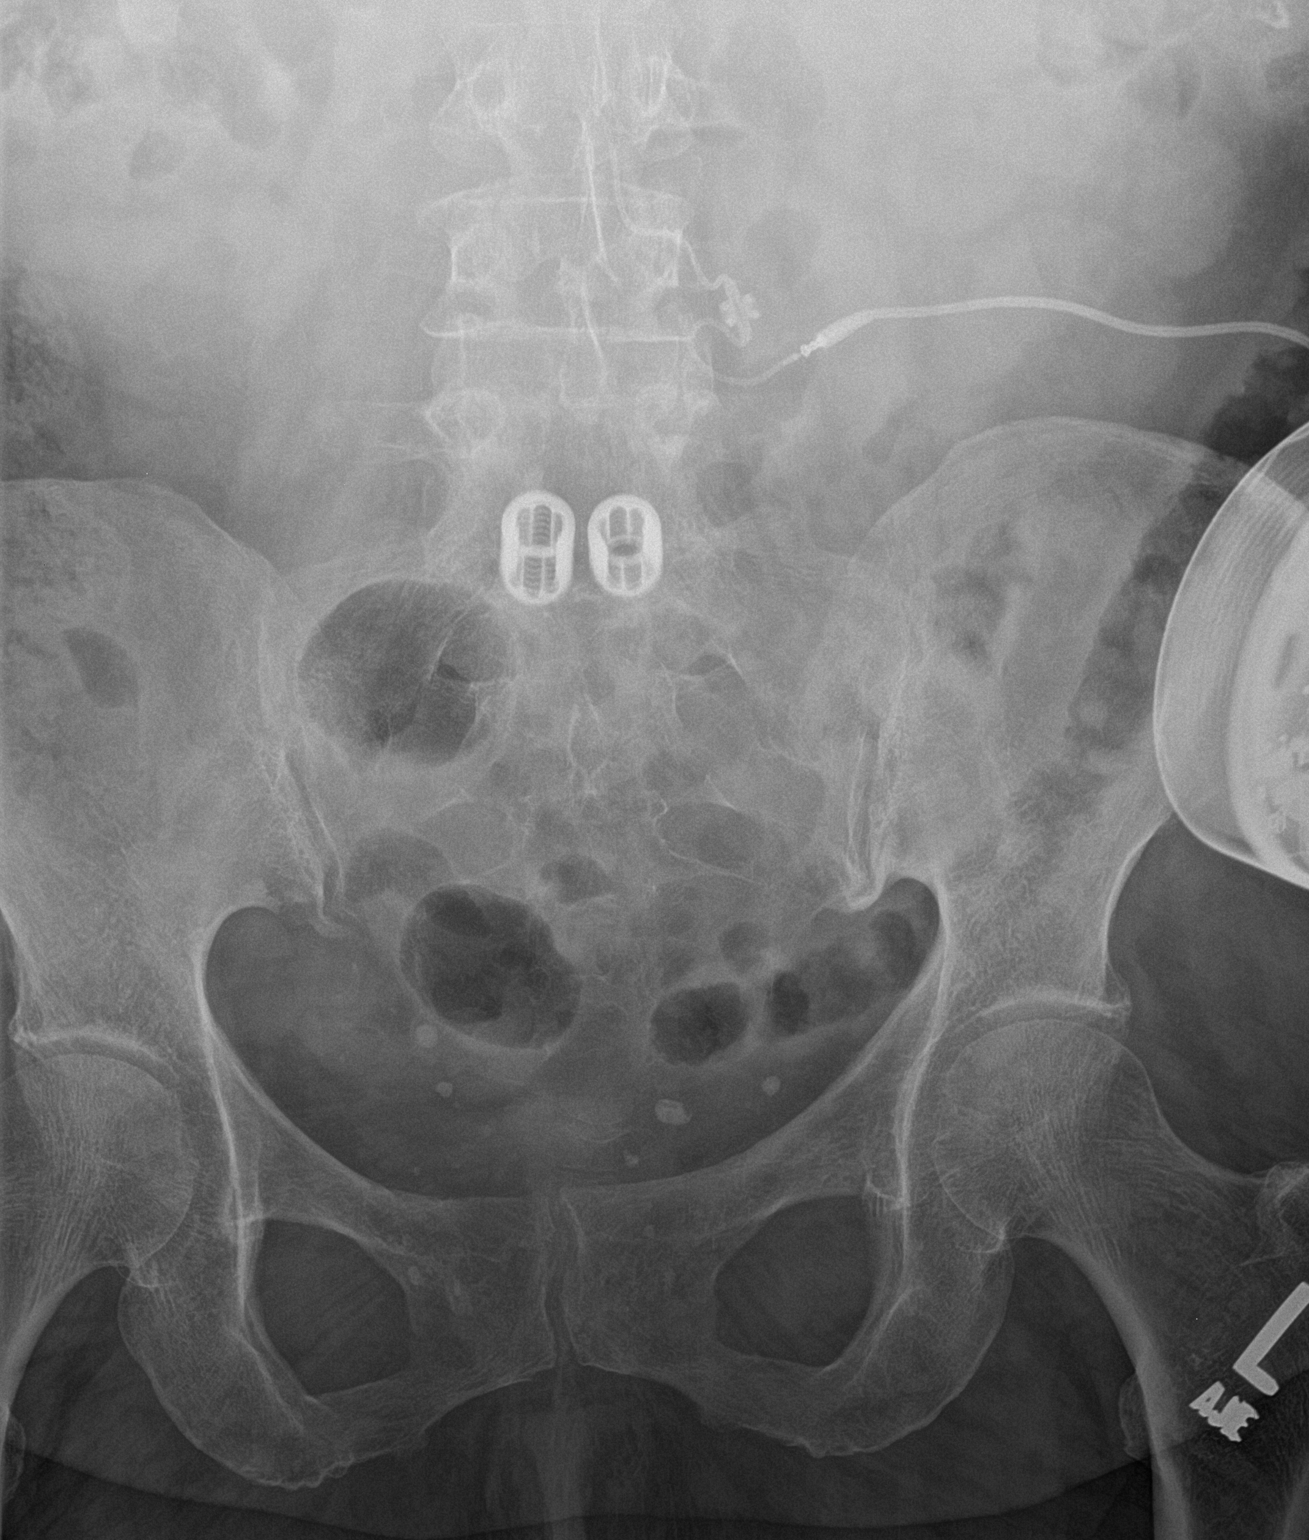
[im 2/3]
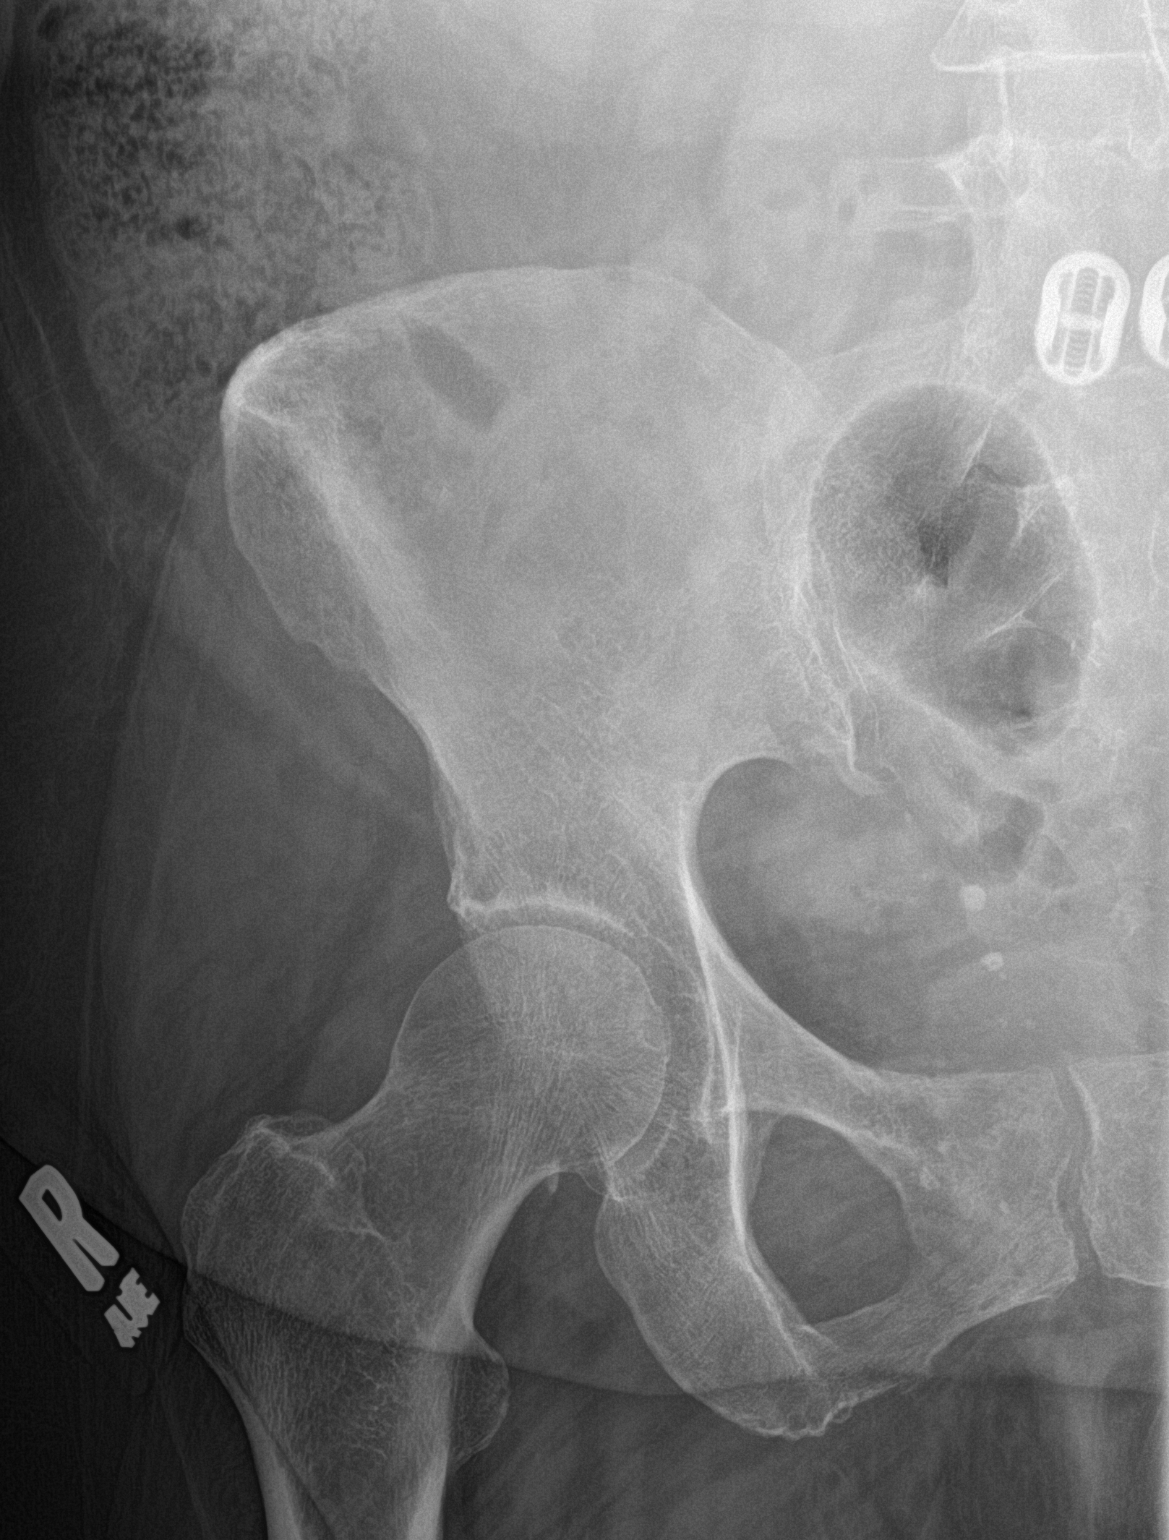
[im 3/3]
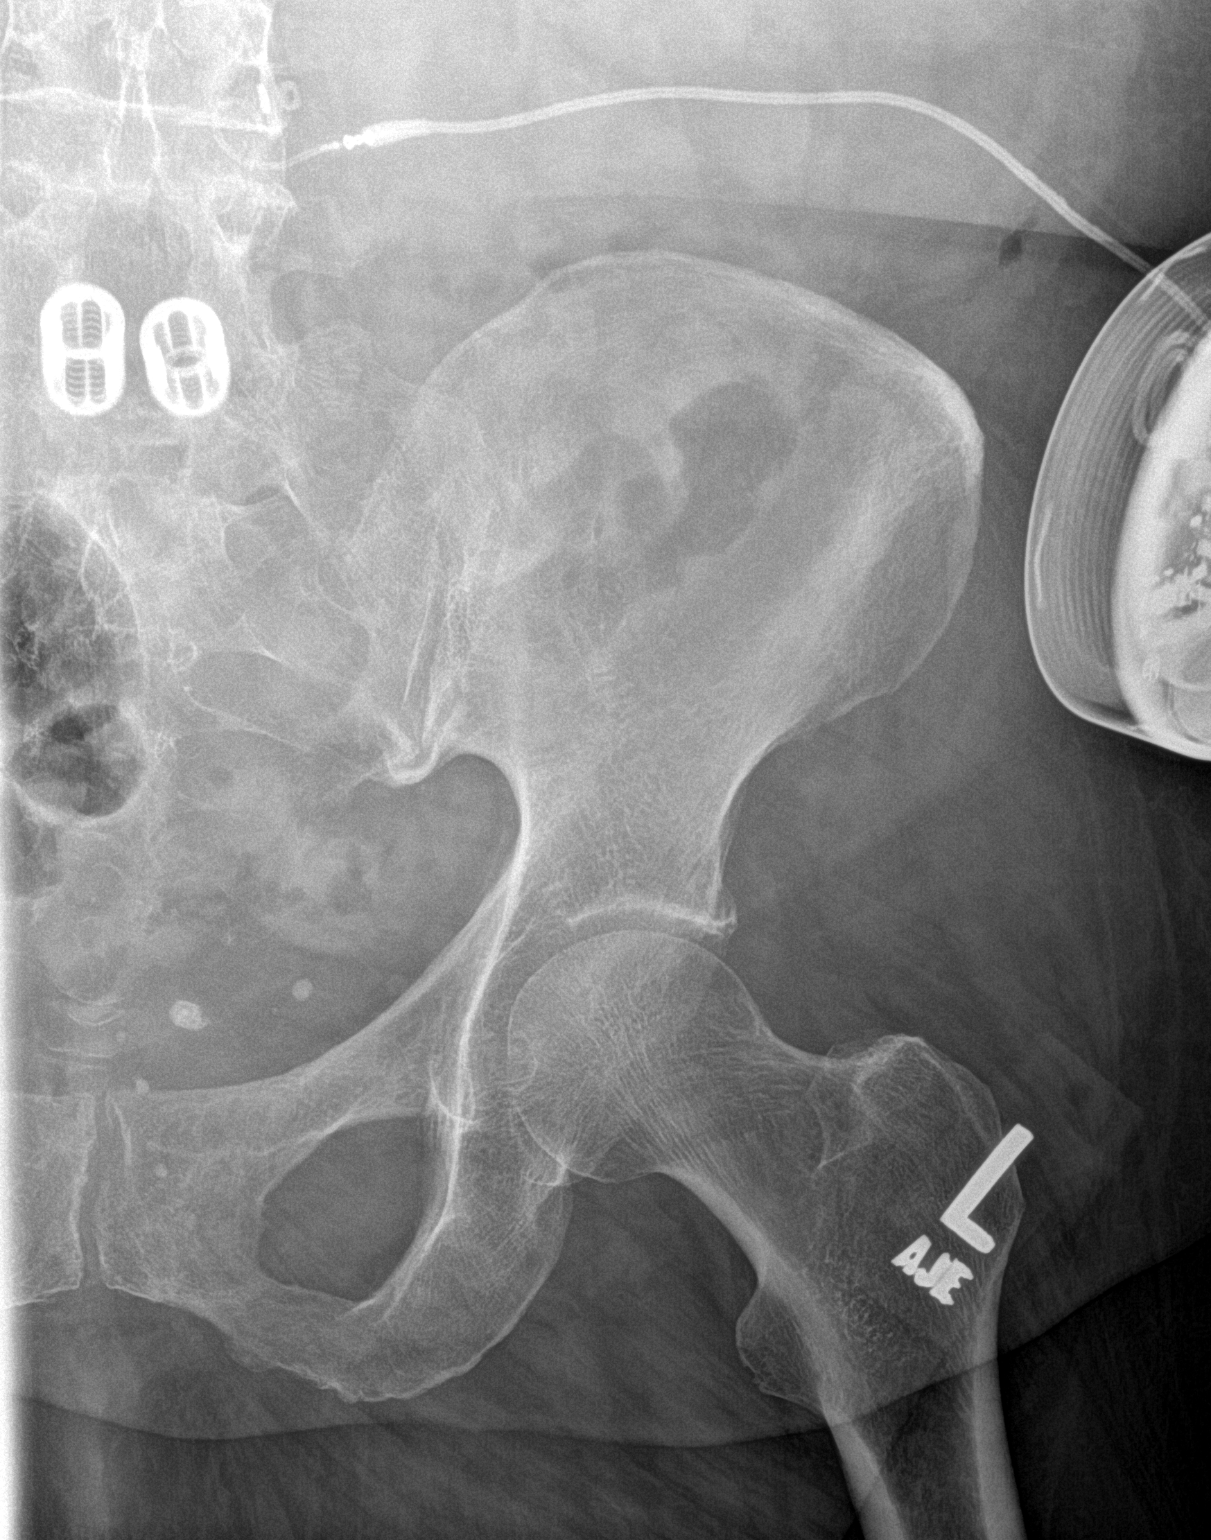

[3 of 3 positions shown; findings below may reference images not displayed]

FINDINGS: The SI joints appear well corticated. There is mild degenerative
spurring present inferiorly. No sacroiliitis is seen. The sacral
foramina appear corticated. No acute abnormality is seen.
IMPRESSION: No sacroiliitis.  Degenerative change involves the SI joints.

## 2017-03-29 ENCOUNTER — Other Ambulatory Visit: Payer: Self-pay

## 2017-03-29 MED ORDER — PAIN MANAGEMENT IT PUMP REFILL: 1.0000 | Freq: Once | INTRATHECAL | 0 refills | Status: DC

## 2017-04-25 ENCOUNTER — Ambulatory Visit: Payer: Medicare Other | Admitting: Pain Medicine

## 2017-04-28 ENCOUNTER — Other Ambulatory Visit: Payer: Self-pay

## 2017-04-28 ENCOUNTER — Ambulatory Visit: Payer: Medicare Other | Attending: Nurse Practitioner | Admitting: Nurse Practitioner

## 2017-04-28 ENCOUNTER — Encounter: Payer: Self-pay | Admitting: Nurse Practitioner

## 2017-04-28 VITALS — BP 149/64 | HR 75 | Temp 98.7°F | Resp 18 | Ht 62.0 in | Wt 185.0 lb

## 2017-04-28 DIAGNOSIS — G894 Chronic pain syndrome: Secondary | ICD-10-CM | POA: Diagnosis present

## 2017-04-28 DIAGNOSIS — M5442 Lumbago with sciatica, left side: Secondary | ICD-10-CM | POA: Insufficient documentation

## 2017-04-28 DIAGNOSIS — G8929 Other chronic pain: Secondary | ICD-10-CM | POA: Diagnosis not present

## 2017-04-28 DIAGNOSIS — Z978 Presence of other specified devices: Secondary | ICD-10-CM

## 2017-04-28 DIAGNOSIS — Z9689 Presence of other specified functional implants: Secondary | ICD-10-CM | POA: Insufficient documentation

## 2017-04-28 DIAGNOSIS — Z881 Allergy status to other antibiotic agents status: Secondary | ICD-10-CM | POA: Diagnosis not present

## 2017-04-28 DIAGNOSIS — M533 Sacrococcygeal disorders, not elsewhere classified: Secondary | ICD-10-CM | POA: Insufficient documentation

## 2017-04-28 DIAGNOSIS — Z882 Allergy status to sulfonamides status: Secondary | ICD-10-CM | POA: Diagnosis not present

## 2017-04-28 MED ORDER — PAIN MANAGEMENT IT PUMP REFILL: 1.0000 | Freq: Once | INTRATHECAL | 0 refills | Status: DC

## 2017-04-28 NOTE — Progress Notes (Addendum)
Patient's Name: Tiffany Hunter  MRN: [id number]  Referring Provider: Iva Lento, PA-C  DOB: [birth year]  PCP: Iva Lento, PA-C  DOS: 04/28/2017  Note by: Dionisio David, NP  Service setting: Ambulatory outpatient  Specialty: Interventional Pain Management  Patient type: Established  Location: [facility] (AMB) Pain Management Facility  Visit type: Interventional Procedure   Primary Reason for Visit: Interventional Pain Management Treatment. CC: Back Pain (lower)  Procedure:  Intrathecal Drug Delivery System (IDDS):  Type: Reservoir Refill [phone]) No rate change Region: Abdominal Laterality: Left  Type of Pump: Medtronic Synchromed II (MRI-compatible) Delivery Route: Intrathecal Type of Pain Treated: Neuropathic/Nociceptive Primary Medication Class: Opioid/opiate  Medication, Concentration, Infusion Program, & Delivery Rate: Please see scanned programming printout.  Indications: 1. 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 Chronic low back pain (Primary Area of Pain) (Left)   3. Chronic sacroiliac joint pain [facility] source of pain) (Left)   4. Chronic pain syndrome    Pain Assessment: Self-Reported Pain Score: 5 /10             Reported level is compatible with observation.        Intrathecal Pump Therapy Assessment  Manufacturer: Medtronic Synchromed II Type: Programmable Volume: 40 mL reservoir MRI compatibility: Yes   Drug content:  Primary Medication Class: Opioid Primary Medication: PF-Hydromorphone (Dilaudid) Secondary Medication: PF-Sufentanil Other Medication: see pump readout   Programming:  Type: Simple continuous. See pump readout for details.   Changes:  Medication Change: None at this point Rate Change: No change in rate  Reported side-effects or adverse reactions: None reported  Effectiveness: Described as relatively effective, allowing for increase in activities of daily living (ADL) Clinically meaningful  improvement in function (CMIF): Sustained CMIF goals met  Plan: Pump refill today  Pre-op Assessment:  Tiffany Hunter is a 71 y.o. (year old), female patient, seen today for interventional treatment. She  has a past surgical history that includes Carpal tunnel release; Back surgery; Breast surgery (Bilateral); Cholecystectomy; Colonoscopy w/ polypectomy; Cataract extraction w/ intraocular lens implant (Right); Fracture surgery (Right); and Abdominal hysterectomy. Tiffany Hunter has a current medication list which includes the following prescription(s): albuterol, aspirin, atorvastatin, carvedilol, hydrocortisone, oxybutynin, and PAIN MANAGEMENT IT PUMP REFILL. Her primarily concern today is the Back Pain (lower)  Initial Vital Signs: Blood pressure (!) 149/64, pulse 75, temperature 98.7 F (37.1 C), temperature source Oral, resp. rate 18, height _0  (1.575 m), weight 185 lb (83.9 kg), SpO2 97 %. BMI: Estimated body mass index is 33.84 kg/m as calculated from the following:   Height as of this encounter: _1  (1.575 m).   Weight as of this encounter: 185 lb (83.9 kg).  Risk Assessment: Allergies: Reviewed. She is allergic to doxycycline hyclate; sulfa antibiotics; and sulfamethoxazole-trimethoprim.  Allergy Precautions: None required Coagulopathies: Reviewed. None identified.  Blood-thinner therapy: None at this time Active Infection(s): Reviewed. None identified. Tiffany Hunter is afebrile  Site Confirmation: Tiffany Hunter was asked to confirm the procedure and laterality before marking the site Procedure checklist: Completed Consent: Before the procedure and under the influence of no sedative(s), amnesic(s), or anxiolytics, the patient was informed of the treatment options, risks and possible complications. To fulfill our ethical and legal obligations, as recommended by the American Medical Association's Code of Ethics, I have informed the patient of my clinical impression; the nature and purpose  of the treatment or procedure; the risks, benefits, and possible complications of the intervention; the alternatives, including doing nothing; the risk(s) and benefit(s)  of the alternative treatment(s) or procedure(s); and the risk(s) and benefit(s) of doing nothing.  Tiffany Hunter was provided with information about the general risks and possible complications associated with most interventional procedures. These include, but are not limited to: failure to achieve desired goals, infection, bleeding, organ or nerve damage, allergic reactions, paralysis, and/or death.  In addition, she was informed of those risks and possible complications associated to this particular procedure, which include, but are not limited to: damage to the implant; failure to decrease pain; local, systemic, or serious CNS infections, intraspinal abscess with possible cord compression and paralysis, or life-threatening such as meningitis; bleeding; organ damage; nerve injury or damage with subsequent sensory, motor, and/or autonomic system dysfunction, resulting in transient or permanent pain, numbness, and/or weakness of one or several areas of the body; allergic reactions, either minor or major life-threatening, such as anaphylactic or anaphylactoid reactions.  Furthermore, Tiffany Hunter was informed of those risks and complications associated with the medications. These include, but are not limited to: allergic reactions (i.e.: anaphylactic or anaphylactoid reactions); endorphine suppression; bradycardia and/or hypotension; water retention and/or peripheral vascular relaxation leading to lower extremity edema and possible stasis ulcers; respiratory depression and/or shortness of breath; decreased metabolic rate leading to weight gain; swelling or edema; medication-induced neural toxicity; particulate matter embolism and blood vessel occlusion with resultant organ, and/or nervous system infarction; and/or intrathecal granuloma formation  with possible spinal cord compression and permanent paralysis.  Before refilling the pump Tiffany Hunter was informed that some of the medications used in the devise may not be FDA approved for such use and therefore it constitutes an off-label use of the medications.  Finally, she was informed that Medicine is not an exact science; therefore, there is also the possibility of unforeseen or unpredictable risks and/or possible complications that may result in a catastrophic outcome. The patient indicated having understood very clearly. We have given the patient no guarantees and we have made no promises. Enough time was given to the patient to ask questions, all of which were answered to the patient's satisfaction. Ms. Garrod has indicated that she wanted to continue with the procedure. Attestation: I, the ordering provider, attest that I have discussed with the patient the benefits, risks, side-effects, alternatives, likelihood of achieving goals, and potential problems during recovery for the procedure that I have provided informed consent. Date: 04/28/2017; Time: 9:25 AM  Pre-Procedure Preparation:  Monitoring: As per clinic protocol. Respiration, ETCO2, SpO2, BP, heart rate and rhythm monitor placed and checked for adequate function Safety Precautions: Patient was assessed for positional comfort and pressure points before starting the procedure. Time-out: I initiated and conducted the "Time-out" before starting the procedure, as per protocol. The patient was asked to participate by confirming the accuracy of the "Time Out" information. Verification of the correct person, site, and procedure were performed and confirmed by me, the nursing staff, and the patient. "Time-out" conducted as per Joint Commission's Universal Protocol (UP.01.01.01). "Time-out" Date & Time: 04/28/2017; 0922 hrs.  Description of Procedure Process:   Position: Supine Target Area: Central-port of intrathecal pump. Approach:  Anterior, 90 degree angle approach. Area Prepped: Entire Area around the pump implant. Prepping solution: ChloraPrep (2% chlorhexidine gluconate and 70% isopropyl alcohol) Safety Precautions: Aspiration looking for blood return was conducted prior to all injections. At no point did we inject any substances, as a needle was being advanced. No attempts were made at seeking any paresthesias. Safe injection practices and needle disposal techniques used. Medications properly checked for expiration  dates. SDV (single dose vial) medications used. Description of the Procedure: Protocol guidelines were followed. Two nurses trained to do implant refills were present during the entire procedure. The refill medication was checked by both healthcare providers as well as the patient. The patient was included in the "Time-out" to verify the medication. The patient was placed in position. The pump was identified. The area was prepped in the usual manner. The sterile template was positioned over the pump, making sure the side-port location matched that of the pump. Both, the pump and the template were held for stability. The needle provided in the Medtronic Kit was then introduced thru the center of the template and into the central port. The pump content was aspirated and discarded volume documented. The new medication was slowly infused into the pump, thru the filter, making sure to avoid overpressure of the device. The needle was then removed and the area cleansed, making sure to leave some of the prepping solution back to take advantage of its long term bactericidal properties. The pump was interrogated and programmed to reflect the correct medication, volume, and dosage. The program was printed and taken to the physician for approval. Once checked and signed by the physician, a copy was provided to the patient and another scanned into the EMR. Vitals:   04/28/17 0922  BP: (!) 149/64  Pulse: 75  Resp: 18  Temp: 98.7 F  (37.1 C)  TempSrc: Oral  SpO2: 97%  Weight: 185 lb (83.9 kg)  Height: _0  (1.575 m)    Start Time: 0924 hrs. End Time:   hrs. Materials & Medications: Medtronic Refill Kit Medication(s): Please see chart orders for details.  Imaging Guidance:  Type of Imaging Technique: None used Indication(s): N/A Exposure Time: No patient exposure Contrast: None used. Fluoroscopic Guidance: N/A Ultrasound Guidance: N/A Interpretation: N/A  Antibiotic Prophylaxis:  Indication(s): None identified Antibiotic given: None  Post-operative Assessment:  EBL: None Complications: No immediate post-treatment complications observed by team, or reported by patient. Note: The patient tolerated the entire procedure well. A repeat set of vitals were taken after the procedure and the patient was kept under observation following institutional policy, for this type of procedure. Post-procedural neurological assessment was performed, showing return to baseline, prior to discharge. The patient was provided with post-procedure discharge instructions, including a section on how to identify potential problems. Should any problems arise concerning this procedure, the patient was given instructions to immediately contact us, at any time, without hesitation. In any case, we plan to contact the patient by telephone for a follow-up status report regarding this interventional procedure. Comments:  No additional relevant information.    Landis Martins, RN  04/28/2017  9:24 AM  Signed Safety precautions to be maintained throughout the outpatient stay will include: orient to surroundings, keep bed in low position, maintain call bell within reach at all times, provide assistance with transfer out of bed and ambulation.   Intrathecal pump accessed under sterile technique without difficulty by Burnett Harry RN. Wasted 3 ml in sink witnessed by K. Higinio Plan RN and Burnett Harry RN.  When pump medicine taken out of bag, noted that it was  leaking due to crack in syringe.   Medication squirted into sterile container.  Dr Dossie Arbour notified and orders given to fill pump with 10 mls and bring back for complete pump refill next week.  Pharmacy notified.   30 ml wasted in sink witnessed by C. Fabio Neighbors and K. Tice RN   Pharmacokinetics: Liberation  and absorption (onset of action): WNL Distribution (time to peak effect): WNL Metabolism and excretion (duration of action): WNL         Pharmacodynamics: Desired effects: Analgesia: Ms. Jeanbaptiste reports >50% benefit. Functional ability: Patient reports that medication allows her to accomplish basic ADLs Clinically meaningful improvement in function (CMIF): Sustained CMIF goals met Perceived effectiveness: Described as relatively effective, allowing for increase in activities of daily living (ADL) Undesirable effects: Side-effects or Adverse reactions: None reported Monitoring: [place] PMP: Online review of the past 68-monthperiod conducted. Compliant with practice rules and regulations Last UDS on record: No results found for: SUMMARY UDS interpretation: Compliant          Medication Assessment Form: Reviewed. Patient indicates being compliant with therapy Treatment compliance: Compliant Risk Assessment Profile: Aberrant behavior: See prior evaluations. None observed or detected today Comorbid factors increasing risk of overdose: See prior notes. No additional risks detected today Risk of substance use disorder (SUD): Low  ORT Scoring interpretation table:  Score <3 = Low Risk for SUD  Score between 4-7 = Moderate Risk for SUD  Score >8 = High Risk for Opioid Abuse   Risk Mitigation Strategies:  Patient Counseling: Covered Patient-Prescriber Agreement (PPA): Present and active  Notification to other healthcare providers: Done  Pharmacologic Plan: No change in therapy, at this time  Assessment  Primary Diagnosis & Pertinent Problem List: The primary encounter diagnosis was Presence  of intrathecal pump (Medtronic programmable intrathecal pump). Diagnoses of Chronic low back pain (Primary Area of Pain) (Left), Chronic sacroiliac joint pain (Tertiary source of pain) (Left), and Chronic pain syndrome were also pertinent to this visit.  Status Diagnosis  Controlled Controlled Controlled 1. 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 Chronic low back pain (Primary Area of Pain) (Left)   3. Chronic sacroiliac joint pain ([facility]) (Left)   4. Chronic pain syndrome     Problems updated and reviewed during this visit: No problems updated. Plan of Care   Imaging Orders  No imaging studies ordered today   Procedure Orders    No procedure(s) ordered today    Medications ordered for procedure: No orders of the defined types were placed in this encounter.  Medications administered: HBilly Coasthad no medications administered during this visit.  See the medical record for exact dosing, route, and time of administration.  This SmartLink is deprecated. Use AVSMEDLIST instead to display the medication list for a patient. Disposition: Discharge home  Discharge Date & Time: 04/28/2017; 1045 hrs.   Physician-requested Follow-up: Return in about 3 months (around 07/27/2017) for Pump refill based on programming. Future Appointments  Date Time Provider DJay 05/04/2017 11:00 AM [facility]il[facility]   Primary Care Physician: WIva Lento PA-C [facility] Note by: CDionisio David NP Date: 04/28/2017; Time: 2:20 PM  Disclaimer:  Medicine is not an exact science. The only guarantee in medicine is that nothing is guaranteed. It is important to note that the decision to proceed with this intervention was based on the information collected from the patient. The Data and conclusions were drawn from the patient's questionnaire, the interview, and the physical  examination. Because the information was provided in large part by the patient, it cannot be guaranteed that it has not been purposely or unconsciously manipulated. Every effort has been made to obtain as much relevant data as possible for this evaluation. It is important to note that the conclusions  that lead to this procedure are derived in large part from the available data. Always take into account that the treatment will also be dependent on availability of resources and existing treatment guidelines, considered by other Pain Management Practitioners as being common knowledge and practice, at the time of the intervention. For Medico-Legal purposes, it is also important to point out that variation in procedural techniques and pharmacological choices are the acceptable norm. The indications, contraindications, technique, and results of the above procedure should only be interpreted and judged by a Board-Certified Interventional Pain Specialist with extensive familiarity and expertise in the same exact procedure and technique.

## 2017-04-28 NOTE — Progress Notes (Signed)
Safety precautions to be maintained throughout the outpatient stay will include: orient to surroundings, keep bed in low position, maintain call bell within reach at all times, provide assistance with transfer out of bed and ambulation.   Intrathecal pump accessed under sterile technique without difficulty by Burnett Harry RN. Wasted 3 ml in sink witnessed by K. Higinio Plan RN and Burnett Harry RN.  When pump medicine taken out of bag, noted that it was leaking due to crack in syringe.   Medication squirted into sterile container.  Dr Dossie Arbour notified and orders given to fill pump with 10 mls and bring back for complete pump refill next week.  Pharmacy notified.   30 ml wasted in sink witnessed by C. Fabio Neighbors and K. Tice RN

## 2017-04-28 NOTE — Patient Instructions (Addendum)
____________________________________________________________________________________________  Medication Rules  Applies to: All patients receiving prescriptions (written or electronic).  Pharmacy of record: Pharmacy where electronic prescriptions will be sent. If written prescriptions are taken to a different pharmacy, please inform the nursing staff. The pharmacy listed in the electronic medical record should be the one where you would like electronic prescriptions to be sent.  Prescription refills: Only during scheduled appointments. Applies to both, written and electronic prescriptions.  NOTE: The following applies primarily to controlled substances (Opioid* Pain Medications).   Patient's responsibilities: 1. Pain Pills: Bring all pain pills to every appointment (except for procedure appointments). 2. Pill Bottles: Bring pills in original pharmacy bottle. Always bring newest bottle. Bring bottle, even if empty. 3. Medication refills: You are responsible for knowing and keeping track of what medications you need refilled. The day before your appointment, write a list of all prescriptions that need to be refilled. Bring that list to your appointment and give it to the admitting nurse. Prescriptions will be written only during appointments. If you forget a medication, it will not be "Called in", "Faxed", or "electronically sent". You will need to get another appointment to get these prescribed. 4. Prescription Accuracy: You are responsible for carefully inspecting your prescriptions before leaving our office. Have the discharge nurse carefully go over each prescription with you, before taking them home. Make sure that your name is accurately spelled, that your address is correct. Check the name and dose of your medication to make sure it is accurate. Check the number of pills, and the written instructions to make sure they are clear and accurate. Make sure that you are given enough medication to  last until your next medication refill appointment. 5. Taking Medication: Take medication as prescribed. Never take more pills than instructed. Never take medication more frequently than prescribed. Taking less pills or less frequently is permitted and encouraged, when it comes to controlled substances (written prescriptions).  6. Inform other Doctors: Always inform, all of your healthcare providers, of all the medications you take. 7. Pain Medication from other Providers: You are not allowed to accept any additional pain medication from any other Doctor or Healthcare provider. There are two exceptions to this rule. (see below) In the event that you require additional pain medication, you are responsible for notifying us, as stated below. 8. Medication Agreement: You are responsible for carefully reading and following our Medication Agreement. This must be signed before receiving any prescriptions from our practice. Safely store a copy of your signed Agreement. Violations to the Agreement will result in no further prescriptions. (Additional copies of our Medication Agreement are available upon request.) 9. Laws, Rules, & Regulations: All patients are expected to follow all Federal and State Laws, Statutes, Rules, & Regulations. Ignorance of the Laws does not constitute a valid excuse. The use of any illegal substances is prohibited. 10. Adopted CDC guidelines & recommendations: Target dosing levels will be at or below 60 MME/day. Use of benzodiazepines** is not recommended.  Exceptions: There are only two exceptions to the rule of not receiving pain medications from other Healthcare Providers. 1. Exception #1 (Emergencies): In the event of an emergency (i.e.: accident requiring emergency care), you are allowed to receive additional pain medication. However, you are responsible for: As soon as you are able, call our office [phone], at any time of the day or night, and leave a message stating your  name, the date and nature of the emergency, and the name and dose of the medication   prescribed. In the event that your call is answered by a member of our staff, make sure to document and save the date, time, and the name of the person that took your information.  2. Exception #2 (Planned Surgery): In the event that you are scheduled by another doctor or dentist to have any type of surgery or procedure, you are allowed (for a period no longer than 30 days), to receive additional pain medication, for the acute post-op pain. However, in this case, you are responsible for picking up a copy of our "Post-op Pain Management for Surgeons" handout, and giving it to your surgeon or dentist. This document is available at our office, and does not require an appointment to obtain it. Simply go to our office during business hours (Monday-Thursday from 8:00 AM to 4:00 PM) (Friday 8:00 AM to 12:00 Noon) or if you have a scheduled appointment with us, prior to your surgery, and ask for it by name. In addition, you will need to provide us with your name, name of your surgeon, type of surgery, and date of procedure or surgery.  *Opioid medications include: morphine, codeine, oxycodone, oxymorphone, hydrocodone, hydromorphone, meperidine, tramadol, tapentadol, buprenorphine, fentanyl, methadone. **Benzodiazepine medications include: diazepam (Valium), alprazolam (Xanax), clonazepam (Klonopine), lorazepam (Ativan), clorazepate (Tranxene), chlordiazepoxide (Librium), estazolam (Prosom), oxazepam (Serax), temazepam (Restoril), triazolam (Halcion)  ____________________________________________________________________________________________  Opioid Overdose Opioids are substances that relieve pain by binding to pain receptors in your brain and spinal cord. Opioids include illegal drugs, such as heroin, as well as prescription pain medicines.An opioid overdose happens when you take too much of an opioid substance. This can happen  with any type of opioid, including:  Heroin.  Morphine.  Codeine.  Methadone.  Oxycodone.  Hydrocodone.  Fentanyl.  Hydromorphone.  Buprenorphine.  The effects of an overdose can be mild, dangerous, or even deadly. Opioid overdose is a medical emergency. What are the causes? This condition may be caused by:  Taking too much of an opioid by accident.  Taking too much of an opioid on purpose.  An error made by a health care provider who prescribes a medicine.  An error made by the pharmacist who fills the prescription order.  Using more than one substance that contains opioids at the same time.  Mixing an opioid with a substance that affects your heart, breathing, or blood pressure. These include alcohol, tranquilizers, sleeping pills, illegal drugs, and some over-the-counter medicines.  What increases the risk? This condition is more likely in:  Children. They may be attracted to colorful pills. Because of a child's small size, even a small amount of a drug can be dangerous.  Elderly people. They may be taking many different drugs. Elderly people may have difficulty reading labels or remembering when they last took their medicine.  People who take an opioid on a long-term basis.  People who use: ? Illegal drugs. ? Other substances, including alcohol, while using an opioid.  People who have: ? A history of drug or alcohol abuse. ? Certain mental health conditions.  People who take opioids that are not prescribed for them.  What are the signs or symptoms? Symptoms of this condition depend on the type of opioid and the amount that was taken. Common symptoms include:  Sleepiness or difficulty waking from sleep.  Confusion.  Slurred speech.  Slowed breathing and a slow pulse.  Nausea and vomiting.  Abnormally small pupils.  Signs and symptoms that require emergency treatment include:  Cold, clammy, and pale skin.  Blue   lips and  fingernails.  Vomiting.  Gurgling sounds in the throat.  A pulse that is very slow or difficult to detect.  Breathing that is very slow, noisy, or difficult to detect.  Limp body.  Inability to respond to speech or be awakened from sleep (stupor).  How is this diagnosed? This condition is diagnosed based on your symptoms. It is important to tell your health care provider:  All of the opioidsthat you took.  When you took the opioids.  Whether you were drinking alcohol or using other substances.  Your health care provider will do a physical exam. This exam may include:  Checking and monitoring your heart rate and rhythm, your breathing rate and depth, your temperature, and your blood pressure (vital signs).  Checking for abnormally small pupils.  Measuring oxygen levels in your blood.  You may also have blood tests or urine tests. How is this treated? Supporting your vital signs and your breathing is the first step in treating an opioid overdose. Treatment may also include:  Giving fluids and minerals (electrolytes) through an IV tube.  Inserting a breathing tube (endotracheal tube) in your airway to help you breathe.  Giving oxygen.  Passing a tube through your nose and into your stomach (NG tube, or nasogastric tube) to wash out your stomach.  Giving medicines that: ? Increase your blood pressure. ? Absorb any opioid that is in your digestive system. ? Reverse the effects of the opioid (naloxone).  Ongoing counseling and mental health support if you intentionally overdosed or used an illegal drug.  Follow these instructions at home:  Take over-the-counter and prescription medicines only as told by your health care provider. Always ask your health care provider about possible side effects and interactions of any new medicine that you start taking.  Keep a list of all of the medicines that you take, including over-the-counter medicines. Bring this list with you to  all of your medical visits.  Drink enough fluid to keep your urine clear or pale yellow.  Keep all follow-up visits as told by your health care provider. This is important. How is this prevented?  Get help if you are struggling with: ? Alcohol or drug use. ? Depression or another mental health problem.  Keep the phone number of your local poison control center near your phone or on your cell phone.  Store all medicines in safety containers that are out of the reach of children.  Read the drug inserts that come with your medicines.  Do not drink alcohol when taking opioids.  Do not use illegal drugs.  Do not take opioid medicines that are not prescribed for you. Contact a health care provider if:  Your symptoms return.  You develop new symptoms or side effects when you are taking medicines. Get help right away if:  You think that you or someone else may have taken too much of an opioid. The hotline of the National Poison Control Center is (800) 222-1222.  You or someone else is having symptoms of an opioid overdose.  You have serious thoughts about hurting yourself or others.  You have: ? Chest pain. ? Difficulty breathing. ? A loss of consciousness. Opioid overdose is an emergency. Do not wait to see if the symptoms will go away. Get medical help right away. Call your local emergency services (911 in the [place] Do not drive yourself to the hospital. This information is not intended to replace advice given to you by your health care provider.   Make sure you discuss any questions you have with your health care provider. Document Released: 06/09/2004 Document Revised: 10/08/2015 Document Reviewed: 10/16/2014 [employer] Interactive Patient Education  2018 [employer]  

## 2017-05-04 ENCOUNTER — Encounter: Payer: Self-pay | Admitting: Pain Medicine

## 2017-05-04 ENCOUNTER — Other Ambulatory Visit: Payer: Self-pay

## 2017-05-04 ENCOUNTER — Ambulatory Visit: Payer: Medicare Other | Attending: Pain Medicine | Admitting: Pain Medicine

## 2017-05-04 VITALS — BP 150/78 | HR 64 | Temp 98.7°F | Resp 16 | Ht 62.0 in | Wt 185.0 lb

## 2017-05-04 DIAGNOSIS — Z462 Encounter for fitting and adjustment of other devices related to nervous system and special senses: Secondary | ICD-10-CM | POA: Insufficient documentation

## 2017-05-04 DIAGNOSIS — G894 Chronic pain syndrome: Secondary | ICD-10-CM | POA: Insufficient documentation

## 2017-05-04 DIAGNOSIS — Z79891 Long term (current) use of opiate analgesic: Secondary | ICD-10-CM

## 2017-05-04 DIAGNOSIS — Z978 Presence of other specified devices: Secondary | ICD-10-CM

## 2017-05-04 DIAGNOSIS — M545 Low back pain: Secondary | ICD-10-CM | POA: Diagnosis not present

## 2017-05-04 DIAGNOSIS — M549 Dorsalgia, unspecified: Secondary | ICD-10-CM | POA: Diagnosis present

## 2017-05-04 DIAGNOSIS — Z9889 Other specified postprocedural states: Secondary | ICD-10-CM | POA: Diagnosis not present

## 2017-05-04 DIAGNOSIS — Z451 Encounter for adjustment and management of infusion pump: Secondary | ICD-10-CM

## 2017-05-04 DIAGNOSIS — Z9841 Cataract extraction status, right eye: Secondary | ICD-10-CM | POA: Diagnosis not present

## 2017-05-04 DIAGNOSIS — M961 Postlaminectomy syndrome, not elsewhere classified: Secondary | ICD-10-CM

## 2017-05-04 DIAGNOSIS — M79605 Pain in left leg: Secondary | ICD-10-CM | POA: Insufficient documentation

## 2017-05-04 DIAGNOSIS — M5442 Lumbago with sciatica, left side: Secondary | ICD-10-CM

## 2017-05-04 DIAGNOSIS — G8929 Other chronic pain: Secondary | ICD-10-CM

## 2017-05-04 NOTE — Progress Notes (Signed)
Safety precautions to be maintained throughout the outpatient stay will include: orient to surroundings, keep bed in low position, maintain call bell within reach at all times, provide assistance with transfer out of bed and ambulation.  

## 2017-05-04 NOTE — Patient Instructions (Signed)
Opioid Overdose Opioids are substances that relieve pain by binding to pain receptors in your brain and spinal cord. Opioids include illegal drugs, such as heroin, as well as prescription pain medicines.An opioid overdose happens when you take too much of an opioid substance. This can happen with any type of opioid, including:  Heroin.  Morphine.  Codeine.  Methadone.  Oxycodone.  Hydrocodone.  Fentanyl.  Hydromorphone.  Buprenorphine.  The effects of an overdose can be mild, dangerous, or even deadly. Opioid overdose is a medical emergency. What are the causes? This condition may be caused by:  Taking too much of an opioid by accident.  Taking too much of an opioid on purpose.  An error made by a health care provider who prescribes a medicine.  An error made by the pharmacist who fills the prescription order.  Using more than one substance that contains opioids at the same time.  Mixing an opioid with a substance that affects your heart, breathing, or blood pressure. These include alcohol, tranquilizers, sleeping pills, illegal drugs, and some over-the-counter medicines.  What increases the risk? This condition is more likely in:  Children. They may be attracted to colorful pills. Because of a child's small size, even a small amount of a drug can be dangerous.  Elderly people. They may be taking many different drugs. Elderly people may have difficulty reading labels or remembering when they last took their medicine.  People who take an opioid on a long-term basis.  People who use: ? Illegal drugs. ? Other substances, including alcohol, while using an opioid.  People who have: ? A history of drug or alcohol abuse. ? Certain mental health conditions.  People who take opioids that are not prescribed for them.  What are the signs or symptoms? Symptoms of this condition depend on the type of opioid and the amount that was taken. Common symptoms  include:  Sleepiness or difficulty waking from sleep.  Confusion.  Slurred speech.  Slowed breathing and a slow pulse.  Nausea and vomiting.  Abnormally small pupils.  Signs and symptoms that require emergency treatment include:  Cold, clammy, and pale skin.  Blue lips and fingernails.  Vomiting.  Gurgling sounds in the throat.  A pulse that is very slow or difficult to detect.  Breathing that is very slow, noisy, or difficult to detect.  Limp body.  Inability to respond to speech or be awakened from sleep (stupor).  How is this diagnosed? This condition is diagnosed based on your symptoms. It is important to tell your health care provider:  All of the opioidsthat you took.  When you took the opioids.  Whether you were drinking alcohol or using other substances.  Your health care provider will do a physical exam. This exam may include:  Checking and monitoring your heart rate and rhythm, your breathing rate and depth, your temperature, and your blood pressure (vital signs).  Checking for abnormally small pupils.  Measuring oxygen levels in your blood.  You may also have blood tests or urine tests. How is this treated? Supporting your vital signs and your breathing is the first step in treating an opioid overdose. Treatment may also include:  Giving fluids and minerals (electrolytes) through an IV tube.  Inserting a breathing tube (endotracheal tube) in your airway to help you breathe.  Giving oxygen.  Passing a tube through your nose and into your stomach (NG tube, or nasogastric tube) to wash out your stomach.  Giving medicines that: ? Increase your   blood pressure. ? Absorb any opioid that is in your digestive system. ? Reverse the effects of the opioid (naloxone).  Ongoing counseling and mental health support if you intentionally overdosed or used an illegal drug.  Follow these instructions at home:  Take over-the-counter and prescription  medicines only as told by your health care provider. Always ask your health care provider about possible side effects and interactions of any new medicine that you start taking.  Keep a list of all of the medicines that you take, including over-the-counter medicines. Bring this list with you to all of your medical visits.  Drink enough fluid to keep your urine clear or pale yellow.  Keep all follow-up visits as told by your health care provider. This is important. How is this prevented?  Get help if you are struggling with: ? Alcohol or drug use. ? Depression or another mental health problem.  Keep the phone number of your local poison control center near your phone or on your cell phone.  Store all medicines in safety containers that are out of the reach of children.  Read the drug inserts that come with your medicines.  Do not drink alcohol when taking opioids.  Do not use illegal drugs.  Do not take opioid medicines that are not prescribed for you. Contact a health care provider if:  Your symptoms return.  You develop new symptoms or side effects when you are taking medicines. Get help right away if:  You think that you or someone else may have taken too much of an opioid. The hotline of the National Poison Control Center is (800) 222-1222.  You or someone else is having symptoms of an opioid overdose.  You have serious thoughts about hurting yourself or others.  You have: ? Chest pain. ? Difficulty breathing. ? A loss of consciousness. Opioid overdose is an emergency. Do not wait to see if the symptoms will go away. Get medical help right away. Call your local emergency services (911 in the [place] Do not drive yourself to the hospital. This information is not intended to replace advice given to you by your health care provider. Make sure you discuss any questions you have with your health care provider. Document Released: 06/09/2004 Document Revised: 10/08/2015 Document  Reviewed: 10/16/2014 Elsevier Interactive Patient Education  2018 [employer]  

## 2017-05-04 NOTE — Progress Notes (Addendum)
Patient's Name: Tiffany Hunter  MRN: [id number]  Referring Provider: Iva Lento, PA-C  DOB: 09-03-1945  PCP: Iva Lento, PA-C  DOS: 05/04/2017  Note by: Gaspar Cola, MD  Service setting: Ambulatory outpatient  Specialty: Interventional Pain Management  Patient type: Established  Location: [facility] (AMB) Pain Management Facility  Visit type: Interventional Procedure   Primary Reason for Visit: Interventional Pain Management Treatment. CC: Back Pain  Procedure:  Intrathecal Drug Delivery System (IDDS):  Type: Reservoir Refill [phone]) No rate change Region: Abdominal Laterality: Left  Type of Pump: Medtronic Synchromed II (MRI-compatible) Delivery Route: Intrathecal Type of Pain Treated: Neuropathic/Nociceptive Primary Medication Class: Opioid/opiate  Medication, Concentration, Infusion Program, & Delivery Rate: Please see scanned programming printout.   Indications: 1. Chronic pain syndrome   2. Chronic low back pain (Primary Area of Pain) (Left)   3. 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 Encounter for adjustment or management of infusion pump   7. Long term current use of opiate analgesic   8. End of battery life of intrathecal infusion pump    Pain Assessment: Self-Reported Pain Score: 0-No pain/10             Reported level is compatible with observation.        Note: Intrathecal pump programming indicates that the battery life of this pump is near its and and that it will need to be replaced before the next refill.  We have contacted the neurosurgery practice in [facility] for reservoir/battery replacement.  The original pump implant and the first replacement were both done by Dr. Erline Levine.  Intrathecal Pump Therapy Assessment  Manufacturer: Medtronic Synchromed II Type: Programmable Volume: 40 mL  reservoir MRI compatibility: Yes   Drug content:  Primary Medication Class: Opioid Primary Medication:PF-Hydromorphone (Dilaudid)(8 mg/mL) Secondary Medication:PF-Sufentanil(70 g/mL) Other Medication: none   Programming:  Type: Simple continuous. See pump readout for details.   Changes:  Medication Change: None at this point Rate Change: No change in rate  Reported side-effects or adverse reactions: None reported  Effectiveness: Described as relatively effective, allowing for increase in activities of daily living (ADL) Clinically meaningful improvement in function (CMIF): Sustained CMIF goals met  Plan: Pump refill today  Pre-op Assessment:  Ms. Krupka is a 71 y.o. (year old), female patient, seen today for interventional treatment. She  has a past surgical history that includes Carpal tunnel release; Back surgery; Breast surgery (Bilateral); Cholecystectomy; Colonoscopy w/ polypectomy; Cataract extraction w/ intraocular lens implant (Right); Fracture surgery (Right); and Abdominal hysterectomy. Ms. Jenison has a current medication list which includes the following prescription(s): albuterol, aspirin, atorvastatin, carvedilol, hydrocortisone, oxybutynin, and PAIN MANAGEMENT IT PUMP REFILL. Her primarily concern today is the Back Pain  Initial Vital Signs: Blood pressure (!) 150/78, pulse 64, temperature 98.7 F (37.1 C), resp. rate 16, height '5\' 2"'  (1.575 m), weight 185 lb (83.9 kg), SpO2 97 %. BMI: Estimated body mass index is 33.84 kg/m as calculated from the following:   Height as of this encounter: '5\' 2"'  (1.575 m).   Weight as of this encounter: 185 lb (83.9 kg).  Risk Assessment: Allergies: Reviewed. She is allergic to doxycycline hyclate; sulfa antibiotics; and sulfamethoxazole-trimethoprim.  Allergy Precautions: None required Coagulopathies: Reviewed. None identified.  Blood-thinner therapy: None at this time Active Infection(s): Reviewed. None identified. Ms.  Petillo is afebrile  Site Confirmation: Ms. Pates  was asked to confirm the procedure and laterality before marking the site Procedure checklist: Completed Consent: Before the procedure and under the influence of no sedative(s), amnesic(s), or anxiolytics, the patient was informed of the treatment options, risks and possible complications. To fulfill our ethical and legal obligations, as recommended by the American Medical Association's Code of Ethics, I have informed the patient of my clinical impression; the nature and purpose of the treatment or procedure; the risks, benefits, and possible complications of the intervention; the alternatives, including doing nothing; the risk(s) and benefit(s) of the alternative treatment(s) or procedure(s); and the risk(s) and benefit(s) of doing nothing.  Ms. Wallner was provided with information about the general risks and possible complications associated with most interventional procedures. These include, but are not limited to: failure to achieve desired goals, infection, bleeding, organ or nerve damage, allergic reactions, paralysis, and/or death.  In addition, she was informed of those risks and possible complications associated to this particular procedure, which include, but are not limited to: damage to the implant; failure to decrease pain; local, systemic, or serious CNS infections, intraspinal abscess with possible cord compression and paralysis, or life-threatening such as meningitis; bleeding; organ damage; nerve injury or damage with subsequent sensory, motor, and/or autonomic system dysfunction, resulting in transient or permanent pain, numbness, and/or weakness of one or several areas of the body; allergic reactions, either minor or major life-threatening, such as anaphylactic or anaphylactoid reactions.  Furthermore, Ms. Vandevander was informed of those risks and complications associated with the medications. These include, but are not limited to:  allergic reactions (i.e.: anaphylactic or anaphylactoid reactions); endorphine suppression; bradycardia and/or hypotension; water retention and/or peripheral vascular relaxation leading to lower extremity edema and possible stasis ulcers; respiratory depression and/or shortness of breath; decreased metabolic rate leading to weight gain; swelling or edema; medication-induced neural toxicity; particulate matter embolism and blood vessel occlusion with resultant organ, and/or nervous system infarction; and/or intrathecal granuloma formation with possible spinal cord compression and permanent paralysis.  Before refilling the pump Ms. Fini was informed that some of the medications used in the devise may not be FDA approved for such use and therefore it constitutes an off-label use of the medications.  Finally, she was informed that Medicine is not an exact science; therefore, there is also the possibility of unforeseen or unpredictable risks and/or possible complications that may result in a catastrophic outcome. The patient indicated having understood very clearly. We have given the patient no guarantees and we have made no promises. Enough time was given to the patient to ask questions, all of which were answered to the patient's satisfaction. Ms. Markgraf has indicated that she wanted to continue with the procedure. Attestation: I, the ordering provider, attest that I have discussed with the patient the benefits, risks, side-effects, alternatives, likelihood of achieving goals, and potential problems during recovery for the procedure that I have provided informed consent. Date: 05/04/2017; Time: 11:45 AM  Pre-Procedure Preparation:  Monitoring: As per clinic protocol. Respiration, ETCO2, SpO2, BP, heart rate and rhythm monitor placed and checked for adequate function Safety Precautions: Patient was assessed for positional comfort and pressure points before starting the procedure. Time-out: I initiated  and conducted the "Time-out" before starting the procedure, as per protocol. The patient was asked to participate by confirming the accuracy of the "Time Out" information. Verification of the correct person, site, and procedure were performed and confirmed by me, the nursing staff, and the patient. "Time-out" conducted as per Joint Commission's Universal Protocol (UP.01.01.01). "  Time-out" Date & Time: 05/04/2017;   hrs.  Description of Procedure Process:   Position: Supine Target Area: Central-port of intrathecal pump. Approach: Anterior, 90 degree angle approach. Area Prepped: Entire Area around the pump implant. Prepping solution: ChloraPrep (2% chlorhexidine gluconate and 70% isopropyl alcohol) Safety Precautions: Aspiration looking for blood return was conducted prior to all injections. At no point did we inject any substances, as a needle was being advanced. No attempts were made at seeking any paresthesias. Safe injection practices and needle disposal techniques used. Medications properly checked for expiration dates. SDV (single dose vial) medications used. Description of the Procedure: Protocol guidelines were followed. Two nurses trained to do implant refills were present during the entire procedure. The refill medication was checked by both healthcare providers as well as the patient. The patient was included in the "Time-out" to verify the medication. The patient was placed in position. The pump was identified. The area was prepped in the usual manner. The sterile template was positioned over the pump, making sure the side-port location matched that of the pump. Both, the pump and the template were held for stability. The needle provided in the Medtronic Kit was then introduced thru the center of the template and into the central port. The pump content was aspirated and discarded volume documented. The new medication was slowly infused into the pump, thru the filter, making sure to avoid  overpressure of the device. The needle was then removed and the area cleansed, making sure to leave some of the prepping solution back to take advantage of its long term bactericidal properties. The pump was interrogated and programmed to reflect the correct medication, volume, and dosage. The program was printed and taken to the physician for approval. Once checked and signed by the physician, a copy was provided to the patient and another scanned into the EMR. Vitals:   05/04/17 1118 05/04/17 1122  BP:  (!) 150/78  Pulse:  64  Resp:  16  Temp:  98.7 F (37.1 C)  SpO2:  97%  Weight: 185 lb (83.9 kg)   Height: '5\' 2"'  (1.575 m)     Start Time:   hrs. End Time:   hrs. Materials & Medications: Medtronic Refill Kit Medication(s): Please see chart orders for details.  Imaging Guidance:  Type of Imaging Technique: None used Indication(s): N/A Exposure Time: No patient exposure Contrast: None used. Fluoroscopic Guidance: N/A Ultrasound Guidance: N/A Interpretation: N/A  Antibiotic Prophylaxis:  Indication(s): None identified Antibiotic given: None  Post-operative Assessment:  EBL: None Complications: No immediate post-treatment complications observed by team, or reported by patient. Note: The patient tolerated the entire procedure well. A repeat set of vitals were taken after the procedure and the patient was kept under observation following institutional policy, for this type of procedure. Post-procedural neurological assessment was performed, showing return to baseline, prior to discharge. The patient was provided with post-procedure discharge instructions, including a section on how to identify potential problems. Should any problems arise concerning this procedure, the patient was given instructions to immediately contact us, at any time, without hesitation. In any case, we plan to contact the patient by telephone for a follow-up status report regarding this interventional  procedure. Comments:  No additional relevant information.  Plan of Care   Imaging Orders  No imaging studies ordered today    Procedure Orders     PUMP REFILL     PUMP REFILL  Medications ordered for procedure: No orders of the defined types were placed in  this encounter.  Medications administered: [name] had no medications administered during this visit.  See the medical record for exact dosing, route, and time of administration.  This SmartLink is deprecated. Use AVSMEDLIST instead to display the medication list for a patient. Disposition: Discharge home  Discharge Date & Time: 05/04/2017; 1204 hrs.   Physician-requested Follow-up: Return for Pump Refill (as per pump program). No future appointments. Primary Care Physician: Iva Lento, PA-C Location: [facility] Note by: Gaspar Cola, MD Date: 05/04/2017; Time: 12:21 PM  Disclaimer:  Medicine is not an Chief Strategy Officer. The only guarantee in medicine is that nothing is guaranteed. It is important to note that the decision to proceed with this intervention was based on the information collected from the patient. The Data and conclusions were drawn from the patient's questionnaire, the interview, and the physical examination. Because the information was provided in large part by the patient, it cannot be guaranteed that it has not been purposely or unconsciously manipulated. Every effort has been made to obtain as much relevant data as possible for this evaluation. It is important to note that the conclusions that lead to this procedure are derived in large part from the available data. Always take into account that the treatment will also be dependent on availability of resources and existing treatment guidelines, considered by other Pain Management Practitioners as being common knowledge and practice, at the time of the intervention. For Medico-Legal purposes, it is also important to  point out that variation in procedural techniques and pharmacological choices are the acceptable norm. The indications, contraindications, technique, and results of the above procedure should only be interpreted and judged by a Board-Certified Interventional Pain Specialist with extensive familiarity and expertise in the same exact procedure and technique.

## 2017-05-05 ENCOUNTER — Telehealth: Payer: Self-pay

## 2017-05-05 NOTE — Telephone Encounter (Signed)
Post procedure phone call.  LM 

## 2017-05-10 ENCOUNTER — Telehealth: Payer: Self-pay

## 2017-06-13 ENCOUNTER — Other Ambulatory Visit: Payer: Self-pay | Admitting: Neurosurgery

## 2017-06-15 ENCOUNTER — Telehealth: Payer: Self-pay | Admitting: Pain Medicine

## 2017-06-15 NOTE — Telephone Encounter (Signed)
Last pump printout faxed to [facility].

## 2017-06-15 NOTE — Telephone Encounter (Signed)
Jessica from Dr. Melven Sartorius office called. They are doing pump revision surgery for Mrs. Reek on Feb 22-19 and need to get dosing for pump meds. They have 2 conflicting reports.  Phone [phone] Fax [phone]

## 2017-06-17 NOTE — H&P (Signed)
Patient ID:   [id number] Patient: Tiffany Hunter  Date of Birth: [birth year] Visit Type: Office Visit   Date: 06/12/2017 09:15 AM Provider: Marchia Meiers. Vertell Limber MD   This 72 year old female presents for back pain.   History of Present Illness: 1.  back pain  Tiffany Hunter, 72 year old female, returns to discuss pump revision.  Last seen in 2012 for revision of her intrathecal pain pump left abdomen, she reports no changes in her medical history.  She reports no issues with her pump, apparently receiving good benefit from the Dilaudid/Sufentanil, managed by Dr. Hope Pigeon [phone]. Pump refill date is 07/20/17.  History:  HTN, ovarian cancer, murmur, asthma, hiatal hernia Surgical history:  Lumbar surgeries 1998 and 1999 by Dr. Owens Shark, hysterectomy, 2 breast cyst, bilateral carpal tunnel release, cholecystectomy, pump placement 2005, with tube revision 2006, pump revision May 2012  SynchroMed II pump was refilled December 20th  by Dr. Consuela Mimes with refill alarm date 07/20/2017           PAST MEDICAL/SURGICAL HISTORY   (Detailed)     Family History  (Detailed) Relationship Family Member Name Deceased Age at Death Condition Onset Age Cause of Death      Family history of Cancer, unknown  N     Social History:  (Detailed) Tobacco use reviewed. Preferred language is Unknown.   Tobacco use status: Current non-smoker. Smoking status: Never smoker.  SMOKING STATUS Type Smoking Status Usage Per Day Years Used Total Pack Years   Never smoker          MEDICATIONS(added, continued or stopped this visit): Started Medication Directions Instruction Stopped   Aspirin Low Dose 81 mg tablet,delayed release take 1 tablet by oral route  every day     atorvastatin 20 mg tablet take 1 tablet by oral route  every day     carvedilol 12.5 mg tablet take 1 tablet by oral route 2 times every day with food     oxybutynin chloride 5 mg tablet take 1 tablet by oral route 2 times  every day       ALLERGIES: Ingredient Reaction Medication Name Comment  NO KNOWN ALLERGIES     No known allergies.   Review of Systems System Neg/Pos Details  Constitutional Negative Chills, Fatigue, Fever, Malaise, Night sweats, Weight gain and Weight loss.  ENMT Negative Ear drainage, Hearing loss, Nasal drainage, Otalgia, Sinus pressure and Sore throat.  Eyes Negative Eye discharge, Eye pain and Vision changes.  Respiratory Negative Chronic cough, Cough, Dyspnea, Known TB exposure and Wheezing.  Cardio Negative Chest pain, Claudication, Edema and Irregular heartbeat/palpitations.  GI Negative Abdominal pain, Blood in stool, Change in stool pattern, Constipation, Decreased appetite, Diarrhea, Heartburn, Nausea and Vomiting.  GU Negative Dysuria, Hematuria, Polyuria (Genitourinary), Urinary frequency, Urinary incontinence and Urinary retention.  Endocrine Negative Cold intolerance, Heat intolerance, Polydipsia and Polyphagia.  Neuro Negative Dizziness, Extremity weakness, Gait disturbance, Headache, Memory impairment, Numbness in extremity, Seizures and Tremors.  Psych Negative Anxiety, Depression and Insomnia.  Integumentary Negative Brittle hair, Brittle nails, Change in shape/size of mole(s), Hair loss, Hirsutism, Hives, Pruritus, Rash and Skin lesion.  MS Negative Back pain, Joint pain, Joint swelling, Muscle weakness and Neck pain.  Hema/Lymph Negative Easy bleeding, Easy bruising and Lymphadenopathy.  Allergic/Immuno Negative Contact allergy, Environmental allergies, Food allergies and Seasonal allergies.  Reproductive Negative Breast discharge, Breast lumps, Dysmenorrhea, Dyspareunia, History of abnormal PAP smear, Hot flashes, Irregular menses and Vaginal discharge.   Vitals Date Temp F BP Pulse Ht  In Wt Lb BMI BSA Pain Score  06/12/2017  117/72 75 62 188 34.39  0/10     PHYSICAL EXAM General Level of Distress: no acute distress Overall Appearance: normal  Head and  Face  Right Left  Fundoscopic Exam:  normal normal    Cardiovascular Cardiac: regular rate and rhythm without murmur  Right Left  Carotid Pulses: normal normal  Respiratory Lungs: clear to auscultation  Neurological Orientation: normal Recent and Remote Memory: normal Attention Span and Concentration:   normal Language: normal Fund of Knowledge: normal  Right Left Sensation: normal normal Upper Extremity Coordination: normal normal  Lower Extremity Coordination: normal normal  Musculoskeletal Gait and Station: normal  Right Left Upper Extremity Muscle Strength: normal normal Lower Extremity Muscle Strength: normal normal Upper Extremity Muscle Tone:  normal normal Lower Extremity Muscle Tone: normal normal   Motor Strength Upper and lower extremity motor strength was tested in the clinically pertinent muscles.     Deep Tendon Reflexes  Right Left Biceps: normal normal Triceps: normal normal Brachioradialis: normal normal Patellar: normal normal Achilles: normal normal  Cranial Nerves II. Optic Nerve/Visual Fields: normal III. Oculomotor: normal IV. Trochlear: normal V. Trigeminal: normal VI. Abducens: normal VII. Facial: normal VIII. Acoustic/Vestibular: normal IX. Glossopharyngeal: normal X. Vagus: normal XI. Spinal Accessory: normal XII. Hypoglossal: normal  Motor and other Tests Lhermittes: negative Rhomberg: negative Pronator drift: absent     Right Left Hoffman's: normal normal Clonus: normal normal Babinski: normal normal   Additional Findings:  Fundoscopic exam is normal. Cranial nerve exam is normal. Full strength in upper and lower extremities. Reflexes are symmetric.     IMPRESSION Patient presents to discuss pump revision. She reports good relief from the use of her pump with Sufentanil and Hydromorphone. On confrontational testing, full strength throughout and symmetric reflexes. Schedule pump replacement for  07/07/17.  Completed Orders (this encounter) Order Details Reason Side Interpretation Result Initial Treatment Date Region  Dietary management education, guidance, and counseling patient encouraged to eat a well balanced diet         Assessment/Plan # Detail Type Description   1. Assessment Body mass index (BMI) 34.0-34.9, adult (Z68.34).   Plan Orders Today's instructions / counseling include(s) Dietary management education, guidance, and counseling.           Pain Management Plan Pain Scale: 0/10. Method: Numeric Pain Intensity Scale. Location: back.  Schedule pump replacement for 07/07/17.   Orders: Instruction(s)/Education: Assessment Instruction  [phone] Dietary management education, guidance, and counseling             Provider:  Vertell Limber MD, Marchia Meiers 06/12/2017 9:55 AM  Dictation edited by: Lucita Lora    CC Providers: [facility] [address] [place], [place] [address]              Electronically signed by Marchia Meiers. Vertell Limber MD on 06/17/2017 12:51 PM

## 2017-06-21 ENCOUNTER — Other Ambulatory Visit: Payer: Self-pay | Admitting: Neurosurgery

## 2017-06-30 NOTE — Pre-Procedure Instructions (Signed)
Tiffany Hunter  06/30/2017      [id number] - [place], [address] [place] [address] Phone: [phone] Fax: [phone]  [facility] [place], [place] [address] AT [facility] OF [address] [place] [facility] [place] [address] Phone: [phone] Fax: [phone]    Your procedure is scheduled on July 07, 2017.  Report to [facility] Admitting at 1230 PM.  Call this number if you have problems the morning of surgery:  [phone]   Remember:  Do not eat food or drink liquids after midnight.  Take these medicines the morning of surgery with A SIP OF WATER albuterol inhaler (bring inhaler with you), carvedilol (coreg), oxybutynin (ditropan).  Beginning now, STOP taking any Aspirin (unless otherwise instructed by your surgeon), Aleve, Naproxen, Ibuprofen, Motrin, Advil, Goody's, BC's, all herbal medications, fish oil, and all vitamins  Continue all other medications as instructed by your physician except follow the above medication instructions before surgery  Do not wear jewelry, make-up or nail polish.  Do not wear lotions, powders, or perfumes, or deodorant.  Do not shave 48 hours prior to surgery.    Do not bring valuables to the hospital.   [facility] is not responsible for any belongings or valuables.  Contacts, dentures or bridgework may not be worn into surgery.  Leave your suitcase in the car.  After surgery it may be brought to your room.  For patients admitted to the hospital, discharge time will be determined by your treatment team.  Patients discharged the day of surgery will not be allowed to drive home.   Special instructions:   [facility]- Preparing For Surgery  Before surgery, you can play an important role. Because skin is not sterile, your skin needs to be as free of germs as possible. You can reduce the number of germs on your skin by washing with CHG  (chlorahexidine gluconate) Soap before surgery.  CHG is an antiseptic cleaner which kills germs and bonds with the skin to continue killing germs even after washing.  Please do not use if you have an allergy to CHG or antibacterial soaps. If your skin becomes reddened/irritated stop using the CHG.  Do not shave (including legs and underarms) for at least 48 hours prior to first CHG shower. It is OK to shave your face.  Please follow these instructions carefully.   1. Shower the NIGHT BEFORE SURGERY and the MORNING OF SURGERY with CHG.   2. If you chose to wash your hair, wash your hair first as usual with your normal shampoo.  3. After you shampoo, rinse your hair and body thoroughly to remove the shampoo.  4. Use CHG as you would any other liquid soap. You can apply CHG directly to the skin and wash gently with a scrungie or a clean washcloth.   5. Apply the CHG Soap to your body ONLY FROM THE NECK DOWN.  Do not use on open wounds or open sores. Avoid contact with your eyes, ears, mouth and genitals (private parts). Wash Face and genitals (private parts)  with your normal soap.  6. Wash thoroughly, paying special attention to the area where your surgery will be performed.  7. Thoroughly rinse your body with warm water from the neck down.  8. DO NOT shower/wash with your normal soap after using and rinsing off the CHG Soap.  9. Pat yourself dry with a CLEAN TOWEL.  10. Wear  CLEAN PAJAMAS to bed the night before surgery, wear comfortable clothes the morning of surgery  11. Place CLEAN SHEETS on your bed the night of your first shower and DO NOT SLEEP WITH PETS.  Day of Surgery: Do not apply any deodorants/lotions. Please wear clean clothes to the hospital/surgery center.    Please read over the following fact sheets that you were given. Pain Booklet, Coughing and Deep Breathing and Surgical Site Infection Prevention

## 2017-07-03 ENCOUNTER — Encounter (HOSPITAL_COMMUNITY): Payer: Self-pay

## 2017-07-03 ENCOUNTER — Encounter (HOSPITAL_COMMUNITY)
Admission: RE | Admit: 2017-07-03 | Discharge: 2017-07-03 | Disposition: A | Discharge status: Home / Self Care | Payer: Medicare Other | Source: Ambulatory Visit | Attending: Neurosurgery | Admitting: Neurosurgery

## 2017-07-03 ENCOUNTER — Other Ambulatory Visit: Payer: Self-pay

## 2017-07-03 DIAGNOSIS — E785 Hyperlipidemia, unspecified: Secondary | ICD-10-CM | POA: Diagnosis not present

## 2017-07-03 DIAGNOSIS — K219 Gastro-esophageal reflux disease without esophagitis: Secondary | ICD-10-CM | POA: Diagnosis not present

## 2017-07-03 DIAGNOSIS — Z01812 Encounter for preprocedural laboratory examination: Secondary | ICD-10-CM | POA: Diagnosis not present

## 2017-07-03 DIAGNOSIS — R011 Cardiac murmur, unspecified: Secondary | ICD-10-CM | POA: Insufficient documentation

## 2017-07-03 DIAGNOSIS — I2510 Atherosclerotic heart disease of native coronary artery without angina pectoris: Secondary | ICD-10-CM | POA: Insufficient documentation

## 2017-07-03 DIAGNOSIS — J45909 Unspecified asthma, uncomplicated: Secondary | ICD-10-CM | POA: Insufficient documentation

## 2017-07-03 DIAGNOSIS — I10 Essential (primary) hypertension: Secondary | ICD-10-CM | POA: Diagnosis not present

## 2017-07-03 HISTORY — DX: Pneumonia, unspecified organism: J18.9

## 2017-07-03 HISTORY — DX: Headache, unspecified: R51.9

## 2017-07-03 HISTORY — DX: Personal history of other diseases of the digestive system: Z87.19

## 2017-07-03 HISTORY — DX: Essential (primary) hypertension: I10

## 2017-07-03 HISTORY — DX: Headache: R51

## 2017-07-03 LAB — BASIC METABOLIC PANEL
ANION GAP: 11 (ref 5–15)
CHLORIDE: 101 mmol/L (ref 101–111)
CO2: 29 mmol/L (ref 22–32)
Calcium: 9.8 mg/dL (ref 8.9–10.3)
Creatinine, Ser: 0.62 mg/dL (ref 0.44–1.00)
GFR calc Af Amer: >60 mL/min (ref >60)
GLUCOSE: 107 mg/dL — AB (ref 65–99)
POTASSIUM: 3.7 mmol/L (ref 3.5–5.1)
Sodium: 141 mmol/L (ref 135–145)

## 2017-07-03 LAB — CBC
HCT: 42.9 % (ref 36.0–46.0)
HEMOGLOBIN: 14.1 g/dL (ref 12.0–15.0)
MCH: 30.4 pg (ref 26.0–34.0)
MCHC: 32.9 g/dL (ref 30.0–36.0)
MCV: 92.5 fL (ref 78.0–100.0)
Platelets: 139 10*3/uL — ABNORMAL LOW (ref 150–400)
RBC: 4.64 MIL/uL (ref 3.87–5.11)
RDW: 12.9 % (ref 11.5–15.5)
WBC: 7.4 10*3/uL (ref 4.0–10.5)

## 2017-07-03 NOTE — Progress Notes (Signed)
PCP - Dr. Laurin Coder at [facility] Cardiologist - Dr. Abran Richard  Chest x-ray - 09/23/2016 EKG - 09/23/2016; tracing requested Stress Test - 01/12/2015 ECHO - 12/01/2015 Cardiac Cath - 2012  Sleep Study - n/a Aspirin Instructions: patient stopped ASA 07/02/2017  Anesthesia review: yes, hx of CAD  Patient denies shortness of breath, fever, cough and chest pain at PAT appointment   Patient verbalized understanding of instructions that were given to them at the PAT appointment. Patient was also instructed that they will need to review over the PAT instructions again at home before surgery.

## 2017-07-04 NOTE — Progress Notes (Signed)
Anesthesia Chart Review:  Pt is a 72 year old female scheduled for revision of intrathecal pain pump L abdomen on 07/07/2017 with Erline Levine, MD  - Cardiologist is Abran Richard, MD. Last office visit 01/12/17 (notes in care everywhere)   PMH includes:  CAD (nonobstructive by 01/12/11 cath), heart murmur, HTN, hyperlipidemia, carotid artery bruit, asthma, GERD. Never smoker. BMI 34  Medications include: Albuterol, ASA 81 mg, Lipitor, carvedilol  BP (!) 112/50   Pulse 71   Temp 36.8 C   Resp 20   Ht 5' 2.5" (1.588 m)   Wt 188 lb (85.3 kg)   SpO2 98%   BMI 33.84 kg/m   Preoperative labs reviewed.    EKG 09/23/16 tracing requested from [facility].  - Report in care everywhere documents: NSR. Nonspecific T wave abnormality.   Cardiac cath 12/24/10 1. Nonobstructive coronary artery disease (20-30% stenosis throughout PDA 2. Normal right heart pressures. 3. Normal left ventricular function.  If no changes, I anticipate pt can proceed with surgery as scheduled.   Willeen Cass, FNP-BC [facility]/Anesthesiology Phone: [phone] 07/04/2017 2:36 PM

## 2017-07-07 ENCOUNTER — Ambulatory Visit (HOSPITAL_COMMUNITY): Payer: Medicare Other | Admitting: Anesthesiology

## 2017-07-07 ENCOUNTER — Ambulatory Visit (HOSPITAL_COMMUNITY): Payer: Medicare Other | Admitting: Emergency Medicine

## 2017-07-07 ENCOUNTER — Ambulatory Visit (HOSPITAL_COMMUNITY)
Admission: RE | Admit: 2017-07-07 | Discharge: 2017-07-07 | Disposition: A | Discharge status: Home / Self Care | Payer: Medicare Other | Source: Ambulatory Visit | Attending: Neurosurgery | Admitting: Neurosurgery

## 2017-07-07 ENCOUNTER — Other Ambulatory Visit: Payer: Self-pay

## 2017-07-07 ENCOUNTER — Encounter (HOSPITAL_COMMUNITY)
Admission: RE | Disposition: A | Discharge status: Home / Self Care | Payer: Self-pay | Source: Ambulatory Visit | Attending: Neurosurgery

## 2017-07-07 ENCOUNTER — Encounter (HOSPITAL_COMMUNITY): Payer: Self-pay

## 2017-07-07 DIAGNOSIS — M1990 Unspecified osteoarthritis, unspecified site: Secondary | ICD-10-CM | POA: Insufficient documentation

## 2017-07-07 DIAGNOSIS — Z4542 Encounter for adjustment and management of neuropacemaker (brain) (peripheral nerve) (spinal cord): Secondary | ICD-10-CM | POA: Diagnosis not present

## 2017-07-07 DIAGNOSIS — Z9049 Acquired absence of other specified parts of digestive tract: Secondary | ICD-10-CM | POA: Insufficient documentation

## 2017-07-07 DIAGNOSIS — I10 Essential (primary) hypertension: Secondary | ICD-10-CM | POA: Insufficient documentation

## 2017-07-07 DIAGNOSIS — Z90710 Acquired absence of both cervix and uterus: Secondary | ICD-10-CM | POA: Diagnosis not present

## 2017-07-07 DIAGNOSIS — E669 Obesity, unspecified: Secondary | ICD-10-CM | POA: Diagnosis not present

## 2017-07-07 DIAGNOSIS — K219 Gastro-esophageal reflux disease without esophagitis: Secondary | ICD-10-CM | POA: Diagnosis not present

## 2017-07-07 DIAGNOSIS — Z6833 Body mass index (BMI) 33.0-33.9, adult: Secondary | ICD-10-CM | POA: Insufficient documentation

## 2017-07-07 DIAGNOSIS — Z881 Allergy status to other antibiotic agents status: Secondary | ICD-10-CM | POA: Diagnosis not present

## 2017-07-07 DIAGNOSIS — G8929 Other chronic pain: Secondary | ICD-10-CM | POA: Diagnosis not present

## 2017-07-07 DIAGNOSIS — Z887 Allergy status to serum and vaccine status: Secondary | ICD-10-CM | POA: Insufficient documentation

## 2017-07-07 DIAGNOSIS — Z79899 Other long term (current) drug therapy: Secondary | ICD-10-CM | POA: Diagnosis not present

## 2017-07-07 DIAGNOSIS — R011 Cardiac murmur, unspecified: Secondary | ICD-10-CM | POA: Insufficient documentation

## 2017-07-07 DIAGNOSIS — I2510 Atherosclerotic heart disease of native coronary artery without angina pectoris: Secondary | ICD-10-CM | POA: Insufficient documentation

## 2017-07-07 DIAGNOSIS — Z8543 Personal history of malignant neoplasm of ovary: Secondary | ICD-10-CM | POA: Insufficient documentation

## 2017-07-07 DIAGNOSIS — J45909 Unspecified asthma, uncomplicated: Secondary | ICD-10-CM | POA: Insufficient documentation

## 2017-07-07 DIAGNOSIS — Z7982 Long term (current) use of aspirin: Secondary | ICD-10-CM | POA: Diagnosis not present

## 2017-07-07 HISTORY — PX: PAIN PUMP REVISION: SHX6231

## 2017-07-07 SURGERY — PAIN PUMP REVISION
Anesthesia: General | Site: Abdomen | Laterality: Left

## 2017-07-07 MED ORDER — GLYCOPYRROLATE 0.2 MG/ML IJ SOLN
INTRAMUSCULAR | Status: DC | PRN
  Administered 2017-07-07: .2 mg via INTRAVENOUS

## 2017-07-07 MED ORDER — EPHEDRINE SULFATE 50 MG/ML IJ SOLN
INTRAMUSCULAR | Status: DC | PRN
  Administered 2017-07-07: 20 mg via INTRAVENOUS

## 2017-07-07 MED ORDER — LACTATED RINGERS IV SOLN
INTRAVENOUS | Status: DC
  Administered 2017-07-07: 12:00:00 via INTRAVENOUS

## 2017-07-07 MED ORDER — DEXAMETHASONE SODIUM PHOSPHATE 10 MG/ML IJ SOLN
INTRAMUSCULAR | Status: AC
  Filled 2017-07-07: qty 1

## 2017-07-07 MED ORDER — SUCCINYLCHOLINE CHLORIDE 200 MG/10ML IV SOSY
PREFILLED_SYRINGE | INTRAVENOUS | Status: DC | PRN
  Administered 2017-07-07: 120 mg via INTRAVENOUS

## 2017-07-07 MED ORDER — VANCOMYCIN HCL 1000 MG IV SOLR
INTRAVENOUS | Status: AC
  Filled 2017-07-07: qty 1000

## 2017-07-07 MED ORDER — NON FORMULARY
Status: DC | PRN
  Administered 2017-07-07: 2800 ug

## 2017-07-07 MED ORDER — FENTANYL CITRATE (PF) 250 MCG/5ML IJ SOLN
INTRAMUSCULAR | Status: AC
  Filled 2017-07-07: qty 5

## 2017-07-07 MED ORDER — LIDOCAINE HCL (CARDIAC) 20 MG/ML IV SOLN
INTRAVENOUS | Status: DC | PRN
  Administered 2017-07-07: 80 mg via INTRAVENOUS

## 2017-07-07 MED ORDER — SUCCINYLCHOLINE CHLORIDE 200 MG/10ML IV SOSY
PREFILLED_SYRINGE | INTRAVENOUS | Status: AC
  Filled 2017-07-07: qty 10

## 2017-07-07 MED ORDER — CHLORHEXIDINE GLUCONATE CLOTH 2 % EX PADS: 6.0000 | MEDICATED_PAD | Freq: Once | CUTANEOUS | Status: DC

## 2017-07-07 MED ORDER — LIDOCAINE-EPINEPHRINE 1 %-1:100000 IJ SOLN
INTRAMUSCULAR | Status: AC
  Filled 2017-07-07: qty 1

## 2017-07-07 MED ORDER — OXYCODONE HCL 5 MG PO TABS: 5.0000 mg | ORAL_TABLET | Freq: Once | ORAL | Status: DC | PRN

## 2017-07-07 MED ORDER — DEXAMETHASONE SODIUM PHOSPHATE 10 MG/ML IJ SOLN
INTRAMUSCULAR | Status: DC | PRN
  Administered 2017-07-07: 6 mg via INTRAVENOUS

## 2017-07-07 MED ORDER — CEFAZOLIN SODIUM-DEXTROSE 2-4 GM/100ML-% IV SOLN
INTRAVENOUS | Status: AC
  Filled 2017-07-07: qty 100

## 2017-07-07 MED ORDER — NON FORMULARY
Status: DC | PRN
  Administered 2017-07-07: 320 mg

## 2017-07-07 MED ORDER — LIDOCAINE 2% (20 MG/ML) 5 ML SYRINGE
INTRAMUSCULAR | Status: AC
  Filled 2017-07-07: qty 15

## 2017-07-07 MED ORDER — CEFAZOLIN SODIUM-DEXTROSE 2-4 GM/100ML-% IV SOLN
2.0000 g | INTRAVENOUS | Status: AC
  Administered 2017-07-07: 2 g via INTRAVENOUS

## 2017-07-07 MED ORDER — BUPIVACAINE HCL (PF) 0.5 % IJ SOLN
INTRAMUSCULAR | Status: AC
  Filled 2017-07-07: qty 30

## 2017-07-07 MED ORDER — PROPOFOL 10 MG/ML IV BOLUS
INTRAVENOUS | Status: AC
  Filled 2017-07-07: qty 20

## 2017-07-07 MED ORDER — ONDANSETRON HCL 4 MG/2ML IJ SOLN: 4.0000 mg | Freq: Once | INTRAMUSCULAR | Status: DC | PRN

## 2017-07-07 MED ORDER — VANCOMYCIN HCL 1000 MG IV SOLR
INTRAVENOUS | Status: DC | PRN
  Administered 2017-07-07: 1000 mg via TOPICAL

## 2017-07-07 MED ORDER — OXYCODONE HCL 5 MG/5ML PO SOLN
5.0000 mg | Freq: Once | ORAL | Status: DC | PRN
Start: 2017-07-07 — End: 2017-07-07

## 2017-07-07 MED ORDER — ONDANSETRON HCL 4 MG/2ML IJ SOLN
INTRAMUSCULAR | Status: DC | PRN
  Administered 2017-07-07: 4 mg via INTRAVENOUS

## 2017-07-07 MED ORDER — SUGAMMADEX SODIUM 200 MG/2ML IV SOLN
INTRAVENOUS | Status: AC
  Filled 2017-07-07: qty 4

## 2017-07-07 MED ORDER — FENTANYL CITRATE (PF) 100 MCG/2ML IJ SOLN
INTRAMUSCULAR | Status: DC | PRN
  Administered 2017-07-07: 50 ug via INTRAVENOUS
  Administered 2017-07-07: 100 ug via INTRAVENOUS

## 2017-07-07 MED ORDER — FENTANYL CITRATE (PF) 100 MCG/2ML IJ SOLN: 25.0000 ug | INTRAMUSCULAR | Status: DC | PRN

## 2017-07-07 MED ORDER — ONDANSETRON HCL 4 MG/2ML IJ SOLN
INTRAMUSCULAR | Status: AC
  Filled 2017-07-07: qty 4

## 2017-07-07 MED ORDER — ROCURONIUM BROMIDE 10 MG/ML (PF) SYRINGE
PREFILLED_SYRINGE | INTRAVENOUS | Status: AC
  Filled 2017-07-07: qty 5

## 2017-07-07 MED ORDER — PHENYLEPHRINE 40 MCG/ML (10ML) SYRINGE FOR IV PUSH (FOR BLOOD PRESSURE SUPPORT)
PREFILLED_SYRINGE | INTRAVENOUS | Status: AC
  Filled 2017-07-07: qty 10

## 2017-07-07 MED ORDER — PROPOFOL 10 MG/ML IV BOLUS
INTRAVENOUS | Status: DC | PRN
  Administered 2017-07-07: 50 mg via INTRAVENOUS
  Administered 2017-07-07: 150 mg via INTRAVENOUS

## 2017-07-07 MED ORDER — ONDANSETRON HCL 4 MG/2ML IJ SOLN
4.0000 mg | Freq: Once | INTRAMUSCULAR | Status: AC
  Administered 2017-07-07: 4 mg via INTRAVENOUS
  Filled 2017-07-07: qty 2

## 2017-07-07 MED ORDER — BUPIVACAINE HCL (PF) 0.5 % IJ SOLN
INTRAMUSCULAR | Status: DC | PRN
  Administered 2017-07-07: 4.5 mL

## 2017-07-07 MED ORDER — LIDOCAINE-EPINEPHRINE 1 %-1:100000 IJ SOLN
INTRAMUSCULAR | Status: DC | PRN
  Administered 2017-07-07: 4.5 mL

## 2017-07-07 MED ORDER — DEXAMETHASONE SODIUM PHOSPHATE 10 MG/ML IJ SOLN
INTRAMUSCULAR | Status: AC
Start: 2017-07-07 — End: ?
  Filled 2017-07-07: qty 2

## 2017-07-07 MED ORDER — 0.9 % SODIUM CHLORIDE (POUR BTL) OPTIME
TOPICAL | Status: DC | PRN
  Administered 2017-07-07: 1000 mL

## 2017-07-07 SURGICAL SUPPLY — 77 items
ADH SKN CLS APL DERMABOND .7 (GAUZE/BANDAGES/DRESSINGS) ×1
BLADE CLIPPER SURG (BLADE) ×1 IMPLANT
BLADE SURG 10 STRL SS (BLADE) ×6 IMPLANT
BLADE SURG 15 STRL LF DISP TIS (BLADE) ×1 IMPLANT
BLADE SURG 15 STRL SS (BLADE) ×3
BOOT SUTURE AID YELLOW STND (SUTURE) ×1 IMPLANT
CABLE BIPOLOR RESECTION CORD (MISCELLANEOUS) ×3 IMPLANT
CANISTER SUCT 3000ML PPV (MISCELLANEOUS) ×3 IMPLANT
CARTRIDGE OIL MAESTRO DRILL (MISCELLANEOUS) ×1 IMPLANT
COVER MAYO STAND STRL (DRAPES) ×1 IMPLANT
DECANTER SPIKE VIAL GLASS SM (MISCELLANEOUS) ×1 IMPLANT
DERMABOND ADVANCED (GAUZE/BANDAGES/DRESSINGS) ×2
DERMABOND ADVANCED .7 DNX12 (GAUZE/BANDAGES/DRESSINGS) IMPLANT
DIFFUSER DRILL AIR PNEUMATIC (MISCELLANEOUS) ×1 IMPLANT
DRAPE C-ARM 42X72 X-RAY (DRAPES) ×1 IMPLANT
DRAPE INCISE IOBAN 66X45 STRL (DRAPES) ×2 IMPLANT
DRAPE INCISE IOBAN 85X60 (DRAPES) ×1 IMPLANT
DRAPE LAPAROTOMY 100X72X124 (DRAPES) ×3 IMPLANT
DRAPE POUCH INSTRU U-SHP 10X18 (DRAPES) ×3 IMPLANT
DRSG OPSITE POSTOP 4X6 (GAUZE/BANDAGES/DRESSINGS) ×2 IMPLANT
DRSG PAD ABDOMINAL 8X10 ST (GAUZE/BANDAGES/DRESSINGS) IMPLANT
ELECT REM PT RETURN 9FT ADLT (ELECTROSURGICAL) ×3
ELECTRODE REM PT RTRN 9FT ADLT (ELECTROSURGICAL) ×1 IMPLANT
GAUZE SPONGE 4X4 12PLY STRL (GAUZE/BANDAGES/DRESSINGS) IMPLANT
GAUZE SPONGE 4X4 16PLY XRAY LF (GAUZE/BANDAGES/DRESSINGS) ×3 IMPLANT
GLOVE BIO SURGEON STRL SZ8 (GLOVE) ×3 IMPLANT
GLOVE BIOGEL PI IND STRL 7.5 (GLOVE) IMPLANT
GLOVE BIOGEL PI IND STRL 8 (GLOVE) ×1 IMPLANT
GLOVE BIOGEL PI IND STRL 8.5 (GLOVE) ×1 IMPLANT
GLOVE BIOGEL PI INDICATOR 7.5 (GLOVE) ×2
GLOVE BIOGEL PI INDICATOR 8 (GLOVE) ×6
GLOVE BIOGEL PI INDICATOR 8.5 (GLOVE) ×2
GLOVE ECLIPSE 7.5 STRL STRAW (GLOVE) ×4 IMPLANT
GLOVE ECLIPSE 8.0 STRL XLNG CF (GLOVE) ×3 IMPLANT
GLOVE EXAM NITRILE LRG STRL (GLOVE) IMPLANT
GLOVE EXAM NITRILE XL STR (GLOVE) IMPLANT
GLOVE EXAM NITRILE XS STR PU (GLOVE) IMPLANT
GOWN STRL REUS W/ TWL LRG LVL3 (GOWN DISPOSABLE) IMPLANT
GOWN STRL REUS W/ TWL XL LVL3 (GOWN DISPOSABLE) IMPLANT
GOWN STRL REUS W/TWL 2XL LVL3 (GOWN DISPOSABLE) ×4 IMPLANT
GOWN STRL REUS W/TWL LRG LVL3 (GOWN DISPOSABLE)
GOWN STRL REUS W/TWL XL LVL3 (GOWN DISPOSABLE) ×3
KIT BASIN OR (CUSTOM PROCEDURE TRAY) ×3 IMPLANT
KIT REFILL (MISCELLANEOUS) ×2
KIT REFILL CATH SYNCHROMED II (MISCELLANEOUS) IMPLANT
KIT ROOM TURNOVER OR (KITS) ×3 IMPLANT
NDL HYPO 18GX1.5 BLUNT FILL (NEEDLE) IMPLANT
NDL HYPO 25X1 1.5 SAFETY (NEEDLE) ×1 IMPLANT
NEEDLE HYPO 18GX1.5 BLUNT FILL (NEEDLE) IMPLANT
NEEDLE HYPO 25X1 1.5 SAFETY (NEEDLE) ×3 IMPLANT
NS IRRIG 1000ML POUR BTL (IV SOLUTION) ×3 IMPLANT
OIL CARTRIDGE MAESTRO DRILL (MISCELLANEOUS)
PACK EENT II TURBAN DRAPE (CUSTOM PROCEDURE TRAY) ×3 IMPLANT
PATTIES SURGICAL .5 X.5 (GAUZE/BANDAGES/DRESSINGS) IMPLANT
PATTIES SURGICAL 1X1 (DISPOSABLE) IMPLANT
PENCIL BUTTON HOLSTER BLD 10FT (ELECTRODE) ×3 IMPLANT
PUMP SYNCHROMED II 40ML RESVR (Neuro Prosthesis/Implant) ×2 IMPLANT
SPONGE LAP 4X18 X RAY DECT (DISPOSABLE) ×3 IMPLANT
SPONGE SURGIFOAM ABS GEL SZ50 (HEMOSTASIS) IMPLANT
STAPLER SKIN PROX WIDE 3.9 (STAPLE) ×1 IMPLANT
SUT BONE WAX W31G (SUTURE) IMPLANT
SUT SILK 0 TIES 10X30 (SUTURE) ×3 IMPLANT
SUT SILK 2 0 PERMA HAND 18 BK (SUTURE) ×6 IMPLANT
SUT SILK 2 0 TIES 10X30 (SUTURE) IMPLANT
SUT VIC AB 0 CT1 18XCR BRD8 (SUTURE) ×2 IMPLANT
SUT VIC AB 0 CT1 8-18 (SUTURE)
SUT VIC AB 2-0 CP2 18 (SUTURE) ×4 IMPLANT
SUT VIC AB 3-0 SH 8-18 (SUTURE) ×7 IMPLANT
SYR 20CC LL (SYRINGE) ×4 IMPLANT
SYR 3ML LL SCALE MARK (SYRINGE) ×2 IMPLANT
SYR BULB IRRIGATION 50ML (SYRINGE) ×2 IMPLANT
SYR CONTROL 10ML LL (SYRINGE) ×3 IMPLANT
TOWEL GREEN STERILE (TOWEL DISPOSABLE) ×3 IMPLANT
TOWEL GREEN STERILE FF (TOWEL DISPOSABLE) ×1 IMPLANT
TUBE CONNECTING 12'X1/4 (SUCTIONS) ×1
TUBE CONNECTING 12X1/4 (SUCTIONS) ×2 IMPLANT
WATER STERILE IRR 1000ML POUR (IV SOLUTION) ×3 IMPLANT

## 2017-07-07 NOTE — Anesthesia Procedure Notes (Signed)
Procedure Name: Intubation Date/Time: 07/07/2017 2:19 PM Performed by: White, Amedeo Plenty, CRNA Pre-anesthesia Checklist: Patient identified, Emergency Drugs available, Suction available and Patient being monitored Patient Re-evaluated:Patient Re-evaluated prior to induction Oxygen Delivery Method: Circle system utilized Preoxygenation: Pre-oxygenation with 100% oxygen Induction Type: IV induction, Rapid sequence and Cricoid Pressure applied Laryngoscope Size: Mac and 3 Grade View: Grade I Tube type: Oral Tube size: 7.0 mm Number of attempts: 1 Airway Equipment and Method: Stylet Placement Confirmation: ETT inserted through vocal cords under direct vision,  positive ETCO2 and breath sounds checked- equal and bilateral Secured at: 20 cm Tube secured with: Tape Dental Injury: Teeth and Oropharynx as per pre-operative assessment

## 2017-07-07 NOTE — Anesthesia Preprocedure Evaluation (Addendum)
Anesthesia Evaluation  Patient identified by MRN, date of birth, ID band Patient awake    Reviewed: Allergy & Precautions, NPO status , Patient's Chart, lab work & pertinent test results, reviewed documented beta blocker date and time   Airway Mallampati: II  TM Distance: >3 FB Neck ROM: Full    Dental  (+) Edentulous Upper, Edentulous Lower   Pulmonary asthma ,    Pulmonary exam normal breath sounds clear to auscultation       Cardiovascular hypertension, Pt. on home beta blockers and Pt. on medications + CAD  Normal cardiovascular exam Rhythm:Regular Rate:Normal  Myoview 4/12: inf and apical reversible defect concerning for ischemia  Cath 8/12: pLAD with mild Ca but no sig stenosis; pRCA 30%, mRCA 20%, dRCA 30% and mild diffuse plaquing throughout the PDA and PL system; EF 60-65%; normal right heart pressures   Neuro/Psych  Headaches, negative psych ROS   GI/Hepatic Neg liver ROS, hiatal hernia, GERD  Medicated and Controlled,Barrett's esophagus   Endo/Other  Obesity  Renal/GU negative Renal ROS  negative genitourinary   Musculoskeletal  (+) Arthritis ,   Abdominal   Peds  Hematology Thrombocytopenia   Anesthesia Other Findings Ovarian cancer  Reproductive/Obstetrics                            Anesthesia Physical Anesthesia Plan  ASA: III  Anesthesia Plan: General   Post-op Pain Management:    Induction: Intravenous  PONV Risk Score and Plan: 3 and Treatment may vary due to age or medical condition, Ondansetron and Dexamethasone  Airway Management Planned: Oral ETT  Additional Equipment: None  Intra-op Plan:   Post-operative Plan: Extubation in OR  Informed Consent: I have reviewed the patients History and Physical, chart, labs and discussed the procedure including the risks, benefits and alternatives for the proposed anesthesia with the patient or authorized representative  who has indicated his/her understanding and acceptance.   Dental advisory given  Plan Discussed with: CRNA  Anesthesia Plan Comments:       Anesthesia Quick Evaluation

## 2017-07-07 NOTE — Interval H&P Note (Signed)
History and Physical Interval Note:  07/07/2017 1:36 PM  Tiffany Hunter  has presented today for surgery, with the diagnosis of End of battery life of intrathecal infusion pump  The various methods of treatment have been discussed with the patient and family. After consideration of risks, benefits and other options for treatment, the patient has consented to  Procedure(s) with comments: Revision of intrathecal pain pump/left abdomen (Left) - Revision of intrathecal pain pump/left abdomen as a surgical intervention .  The patient's history has been reviewed, patient examined, no change in status, stable for surgery.  I have reviewed the patient's chart and labs.  Questions were answered to the patient's satisfaction.     Brenlyn Beshara D

## 2017-07-07 NOTE — Transfer of Care (Signed)
Immediate Anesthesia Transfer of Care Note  Patient: Tiffany Hunter  Procedure(s) Performed: Revision of intrathecal pain pump/left abdomen (Left Abdomen)  Patient Location: PACU  Anesthesia Type:General  Level of Consciousness: awake, alert , oriented and patient cooperative  Airway & Oxygen Therapy: Patient Spontanous Breathing and Patient connected to nasal cannula oxygen  Post-op Assessment: Report given to RN and Post -op Vital signs reviewed and stable  Post vital signs: Reviewed and stable  Last Vitals:  Vitals:   07/07/17 1112 07/07/17 1534  BP: (!) 153/70   Pulse: 76   Resp: 18   Temp: 37.2 C (P) 36.4 C  SpO2: 96%     Last Pain:  Vitals:   07/07/17 1534  TempSrc:   PainSc: (P) 0-No pain      Patients Stated Pain Goal: 5 (50/27/74 1287)  Complications: No apparent anesthesia complications

## 2017-07-07 NOTE — Op Note (Signed)
07/07/2017  3:17 PM  PATIENT:  Tiffany Hunter  72 y.o. female  PRE-OPERATIVE DIAGNOSIS:  End of battery life of intrathecal infusion pump for chronic pain  POST-OPERATIVE DIAGNOSIS: End of battery life of intrathecal infusion pump for chronic pain  PROCEDURE:  Procedure(s): Revision of intrathecal pain pump/left abdomen (Left)  SURGEON:  Surgeon(s) and Role:    Erline Levine, MD - Primary  PHYSICIAN ASSISTANT:   ASSISTANTS: Poteat, RN   ANESTHESIA:   LMA and local  EBL:  20 mL   BLOOD ADMINISTERED:none  DRAINS: none   LOCAL MEDICATIONS USED:  MARCAINE    and LIDOCAINE   SPECIMEN:  No Specimen  DISPOSITION OF SPECIMEN:  N/A  COUNTS:  YES  TOURNIQUET:  * No tourniquets in log *  DICTATION: DICTATION: Patient has implanted intrathecal  pump for chronic pain, which is now depleted.  It was elected for patient to undergo intrathecal pump revision.  PROCEDURE: Patient was brought to the operating room and given LMA.  Left lower quadrant was prepped with betadine scrub and Duraprep.  Area of planned incision was infiltrated with marcaine.  Prior incision was reopened and the old intrathecal pump was externalized.  Catheter was disconnected. There was slow spontaneous flow of CSF.  The new 40 cc pump was filled and connected to the catheter. The new pump was then internalized into the previously placed pocket. Wound was irrigated with vancomycin. Then irrigated once more.  Incision was closed with 2-0 Vicryl and 3-0 vicryl sutures and dressed with Dermabond and a sterile occlusive dressing.  Counts were correct at the end of the case.  PLAN OF CARE: Discharge to home after PACU  PATIENT DISPOSITION:  PACU - hemodynamically stable.   Delay start of Pharmacological VTE agent (>24hrs) due to surgical blood loss or risk of bleeding: yes

## 2017-07-07 NOTE — Anesthesia Postprocedure Evaluation (Signed)
Anesthesia Post Note  Patient: Tiffany Hunter  Procedure(s) Performed: Revision of intrathecal pain pump/left abdomen (Left Abdomen)     Patient location during evaluation: PACU Anesthesia Type: General Level of consciousness: awake and alert Pain management: pain level controlled Vital Signs Assessment: post-procedure vital signs reviewed and stable Respiratory status: spontaneous breathing, nonlabored ventilation, respiratory function stable and patient connected to nasal cannula oxygen Cardiovascular status: blood pressure returned to baseline and stable Postop Assessment: no apparent nausea or vomiting Anesthetic complications: no    Last Vitals:  Vitals:   07/07/17 1629 07/07/17 1659  BP: 134/62 130/77  Pulse: 73 80  Resp: 12 14  Temp:    SpO2: 93% 93%    Last Pain:  Vitals:   07/07/17 1635  TempSrc:   PainSc: 0-No pain                 Audry Pili

## 2017-07-07 NOTE — Brief Op Note (Signed)
07/07/2017  3:17 PM  PATIENT:  Tiffany Hunter  72 y.o. female  PRE-OPERATIVE DIAGNOSIS:  End of battery life of intrathecal infusion pump for chronic pain  POST-OPERATIVE DIAGNOSIS: End of battery life of intrathecal infusion pump for chronic pain  PROCEDURE:  Procedure(s): Revision of intrathecal pain pump/left abdomen (Left)  SURGEON:  Surgeon(s) and Role:    Erline Levine, MD - Primary  PHYSICIAN ASSISTANT:   ASSISTANTS: Poteat, RN   ANESTHESIA:   LMA and local  EBL:  20 mL   BLOOD ADMINISTERED:none  DRAINS: none   LOCAL MEDICATIONS USED:  MARCAINE    and LIDOCAINE   SPECIMEN:  No Specimen  DISPOSITION OF SPECIMEN:  N/A  COUNTS:  YES  TOURNIQUET:  * No tourniquets in log *  DICTATION: DICTATION: Patient has implanted intrathecal  pump for chronic pain, which is now depleted.  It was elected for patient to undergo intrathecal pump revision.  PROCEDURE: Patient was brought to the operating room and given LMA.  Left lower quadrant was prepped with betadine scrub and Duraprep.  Area of planned incision was infiltrated with marcaine.  Prior incision was reopened and the old intrathecal pump was externalized.  Catheter was disconnected. There was slow spontaneous flow of CSF.  The new 40 cc pump was filled and connected to the catheter. The new pump was then internalized into the previously placed pocket. Wound was irrigated with vancomycin. Then irrigated once more.  Incision was closed with 2-0 Vicryl and 3-0 vicryl sutures and dressed with Dermabond and a sterile occlusive dressing.  Counts were correct at the end of the case.  PLAN OF CARE: Discharge to home after PACU  PATIENT DISPOSITION:  PACU - hemodynamically stable.   Delay start of Pharmacological VTE agent (>24hrs) due to surgical blood loss or risk of bleeding: yes

## 2017-07-07 NOTE — Progress Notes (Signed)
Patient is doing well.  Discharge home from PACU.

## 2017-07-07 NOTE — OR Nursing (Signed)
8 ml hydromorphone/sufentanil from explanted pump wasted with discarded pump in sharps container by T. Aldrich RN @1510 

## 2017-07-07 NOTE — Progress Notes (Signed)
[employer] company notified of pt's surgery to have battery replaced.

## 2017-07-10 ENCOUNTER — Encounter (HOSPITAL_COMMUNITY): Payer: Self-pay | Admitting: Neurosurgery

## 2017-07-13 ENCOUNTER — Telehealth: Payer: Self-pay

## 2017-07-13 NOTE — Telephone Encounter (Signed)
Please schedule patient for a pump refill around May 1 or 2nd.  Please let Nonnie Done know the date and time so that we may put in pump book and order medication.  Thank you

## 2017-07-14 NOTE — Telephone Encounter (Signed)
Scheduled pump refill for May 2 @ 11am

## 2017-09-07 ENCOUNTER — Other Ambulatory Visit: Payer: Self-pay

## 2017-09-07 MED ORDER — PAIN MANAGEMENT IT PUMP REFILL: 1.0000 | Freq: Once | INTRATHECAL | 0 refills | Status: DC

## 2017-09-14 ENCOUNTER — Ambulatory Visit: Payer: Medicare Other | Admitting: Pain Medicine

## 2017-09-14 DIAGNOSIS — L989 Disorder of the skin and subcutaneous tissue, unspecified: Secondary | ICD-10-CM | POA: Insufficient documentation

## 2017-09-19 ENCOUNTER — Encounter: Payer: Self-pay | Admitting: Pain Medicine

## 2017-09-19 ENCOUNTER — Ambulatory Visit: Payer: Medicare Other | Attending: Pain Medicine | Admitting: Pain Medicine

## 2017-09-19 ENCOUNTER — Other Ambulatory Visit: Payer: Self-pay

## 2017-09-19 VITALS — BP 122/58 | HR 64 | Temp 98.8°F | Resp 18 | Ht 64.0 in | Wt 180.0 lb

## 2017-09-19 DIAGNOSIS — M5442 Lumbago with sciatica, left side: Secondary | ICD-10-CM

## 2017-09-19 DIAGNOSIS — Z4681 Encounter for fitting and adjustment of insulin pump: Secondary | ICD-10-CM | POA: Insufficient documentation

## 2017-09-19 DIAGNOSIS — Z9841 Cataract extraction status, right eye: Secondary | ICD-10-CM | POA: Diagnosis not present

## 2017-09-19 DIAGNOSIS — Z882 Allergy status to sulfonamides status: Secondary | ICD-10-CM | POA: Diagnosis not present

## 2017-09-19 DIAGNOSIS — M545 Low back pain: Secondary | ICD-10-CM | POA: Insufficient documentation

## 2017-09-19 DIAGNOSIS — Z451 Encounter for adjustment and management of infusion pump: Secondary | ICD-10-CM

## 2017-09-19 DIAGNOSIS — M47816 Spondylosis without myelopathy or radiculopathy, lumbar region: Secondary | ICD-10-CM | POA: Diagnosis not present

## 2017-09-19 DIAGNOSIS — G894 Chronic pain syndrome: Secondary | ICD-10-CM | POA: Insufficient documentation

## 2017-09-19 DIAGNOSIS — G8929 Other chronic pain: Secondary | ICD-10-CM

## 2017-09-19 DIAGNOSIS — M961 Postlaminectomy syndrome, not elsewhere classified: Secondary | ICD-10-CM

## 2017-09-19 DIAGNOSIS — Z978 Presence of other specified devices: Secondary | ICD-10-CM

## 2017-09-19 MED FILL — Medication: INTRATHECAL | Qty: 1 | Status: AC

## 2017-09-19 NOTE — Progress Notes (Addendum)
Patient's Name: Tiffany Hunter  MRN: [id number]  Referring Provider: Iva Lento, PA-C  DOB: 1945/08/22  PCP: Iva Lento, PA-C  DOS: 09/19/2017  Note by: Gaspar Cola, MD  Service setting: Ambulatory outpatient  Specialty: Interventional Pain Management  Patient type: Established  Location: [facility] (AMB) Pain Management Facility  Visit type: Interventional Procedure   Primary Reason for Visit: Interventional Pain Management Treatment. CC: Back Pain (lower)  Procedure:  Intrathecal Drug Delivery System (IDDS):  Type: Reservoir Refill [phone]) No rate change Region: Abdominal Laterality: Left  Type of Pump: Medtronic Synchromed II (MRI-compatible) Delivery Route: Intrathecal Type of Pain Treated: Neuropathic/Nociceptive Primary Medication Class: Opioid/opiate  Medication, Concentration, Infusion Program, & Delivery Rate: Please see scanned programming printout.   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 Chronic pain syndrome   3. Chronic low back pain (Primary Area of Pain) (Left)   4. Lumbar spondylosis   5. 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 Encounter for adjustment or management of infusion pump    Pain Assessment: Self-Reported Pain Score: 7 /10             Reported level is compatible with observation.        Intrathecal Pump Therapy Assessment  Manufacturer: Medtronic Synchromed II Type: Programmable Volume: 40 mL reservoir MRI compatibility: Yes   Drug content:  Primary Medication Class:Opioid Primary Medication:PF-Hydromorphone (Dilaudid)(8 mg/mL) Secondary Medication:PF-Sufentanil(70 g/mL) Other Medication:none   Programming:  Type: Simple continuous. See pump readout for details.   Changes:  Medication Change: None at this point Rate Change: No change in rate  Reported side-effects or adverse reactions: None reported  Effectiveness: Described as relatively effective,  allowing for increase in activities of daily living (ADL) Clinically meaningful improvement in function (CMIF): Sustained CMIF goals met  Plan: Pump refill today  Pre-op Assessment:  Tiffany Hunter is a 72 y.o. (year old), female patient, seen today for interventional treatment. She  has a past surgical history that includes Carpal tunnel release (Bilateral); Back surgery; Breast surgery (Bilateral); Cholecystectomy; Colonoscopy w/ polypectomy; Cataract extraction w/ intraocular lens implant (Right); Abdominal hysterectomy; Fracture surgery (Right); Cardiac catheterization (2012); and Pain pump revision (Left, 07/07/2017). Tiffany Hunter has a current medication list which includes the following prescription(s): aspirin ec, atorvastatin, carvedilol, cholecalciferol, clotrimazole-betamethasone, hydrocortisone, oxybutynin, albuterol, and PAIN MANAGEMENT IT PUMP REFILL. Her primarily concern today is the Back Pain (lower)  Initial Vital Signs:  Pulse/HCG Rate: 64  Temp: 98.8 F (37.1 C) Resp: 18 BP: (!) 122/58 SpO2: 97 %  BMI: Estimated body mass index is 30.9 kg/m as calculated from the following:   Height as of this encounter: '5\' 4"'  (1.626 m).   Weight as of this encounter: 180 lb (81.6 kg).  Risk Assessment: Allergies: Reviewed. She is allergic to doxycycline hyclate and sulfa antibiotics.  Allergy Precautions: None required Coagulopathies: Reviewed. None identified.  Blood-thinner therapy: None at this time Active Infection(s): Reviewed. None identified. Tiffany Hunter is afebrile  Site Confirmation: Tiffany Hunter was asked to confirm the procedure and laterality before marking the site Procedure checklist: Completed Consent: Before the procedure and under the influence of no sedative(s), amnesic(s), or anxiolytics, the patient was informed of the treatment options, risks and possible complications. To fulfill our ethical and legal obligations, as recommended by the American Medical  Association's Code of Ethics, I have informed the patient of my clinical impression; the nature and purpose of the treatment or procedure; the risks, benefits, and possible complications  of the intervention; the alternatives, including doing nothing; the risk(s) and benefit(s) of the alternative treatment(s) or procedure(s); and the risk(s) and benefit(s) of doing nothing.  Tiffany Hunter was provided with information about the general risks and possible complications associated with most interventional procedures. These include, but are not limited to: failure to achieve desired goals, infection, bleeding, organ or nerve damage, allergic reactions, paralysis, and/or death.  In addition, she was informed of those risks and possible complications associated to this particular procedure, which include, but are not limited to: damage to the implant; failure to decrease pain; local, systemic, or serious CNS infections, intraspinal abscess with possible cord compression and paralysis, or life-threatening such as meningitis; bleeding; organ damage; nerve injury or damage with subsequent sensory, motor, and/or autonomic system dysfunction, resulting in transient or permanent pain, numbness, and/or weakness of one or several areas of the body; allergic reactions, either minor or major life-threatening, such as anaphylactic or anaphylactoid reactions.  Furthermore, Tiffany Hunter was informed of those risks and complications associated with the medications. These include, but are not limited to: allergic reactions (i.e.: anaphylactic or anaphylactoid reactions); endorphine suppression; bradycardia and/or hypotension; water retention and/or peripheral vascular relaxation leading to lower extremity edema and possible stasis ulcers; respiratory depression and/or shortness of breath; decreased metabolic rate leading to weight gain; swelling or edema; medication-induced neural toxicity; particulate matter embolism and blood  vessel occlusion with resultant organ, and/or nervous system infarction; and/or intrathecal granuloma formation with possible spinal cord compression and permanent paralysis.  Before refilling the pump Tiffany Hunter was informed that some of the medications used in the devise may not be FDA approved for such use and therefore it constitutes an off-label use of the medications.  Finally, she was informed that Medicine is not an exact science; therefore, there is also the possibility of unforeseen or unpredictable risks and/or possible complications that may result in a catastrophic outcome. The patient indicated having understood very clearly. We have given the patient no guarantees and we have made no promises. Enough time was given to the patient to ask questions, all of which were answered to the patient's satisfaction. Tiffany Hunter has indicated that she wanted to continue with the procedure. Attestation: I, the ordering provider, attest that I have discussed with the patient the benefits, risks, side-effects, alternatives, likelihood of achieving goals, and potential problems during recovery for the procedure that I have provided informed consent. Date  Time: 09/19/2017  9:59 AM  Pre-Procedure Preparation:  Monitoring: As per clinic protocol. Respiration, ETCO2, SpO2, BP, heart rate and rhythm monitor placed and checked for adequate function Safety Precautions: Patient was assessed for positional comfort and pressure points before starting the procedure. Time-out: I initiated and conducted the "Time-out" before starting the procedure, as per protocol. The patient was asked to participate by confirming the accuracy of the "Time Out" information. Verification of the correct person, site, and procedure were performed and confirmed by me, the nursing staff, and the patient. "Time-out" conducted as per Joint Commission's Universal Protocol (UP.01.01.01). Time: 1001  Description of Procedure Process:    Position: Supine Target Area: Central-port of intrathecal pump. Approach: Anterior, 90 degree angle approach. Area Prepped: Entire Area around the pump implant. Prepping solution: ChloraPrep (2% chlorhexidine gluconate and 70% isopropyl alcohol) Safety Precautions: Aspiration looking for blood return was conducted prior to all injections. At no point did we inject any substances, as a needle was being advanced. No attempts were made at seeking any paresthesias. Safe injection practices and  needle disposal techniques used. Medications properly checked for expiration dates. SDV (single dose vial) medications used. Description of the Procedure: Protocol guidelines were followed. Two nurses trained to do implant refills were present during the entire procedure. The refill medication was checked by both healthcare providers as well as the patient. The patient was included in the "Time-out" to verify the medication. The patient was placed in position. The pump was identified. The area was prepped in the usual manner. The sterile template was positioned over the pump, making sure the side-port location matched that of the pump. Both, the pump and the template were held for stability. The needle provided in the Medtronic Kit was then introduced thru the center of the template and into the central port. The pump content was aspirated and discarded volume documented. The new medication was slowly infused into the pump, thru the filter, making sure to avoid overpressure of the device. The needle was then removed and the area cleansed, making sure to leave some of the prepping solution back to take advantage of its long term bactericidal properties. The pump was interrogated and programmed to reflect the correct medication, volume, and dosage. The program was printed and taken to the physician for approval. Once checked and signed by the physician, a copy was provided to the patient and another scanned into the  EMR. Vitals:   09/19/17 0957  BP: (!) 122/58  Pulse: 64  Resp: 18  Temp: 98.8 F (37.1 C)  TempSrc: Oral  SpO2: 97%  Weight: 180 lb (81.6 kg)  Height: '5\' 4"'  (1.626 m)    Start Time: 1003 hrs. End Time: 1017 hrs. Materials & Medications: Medtronic Refill Kit Medication(s): Please see chart orders for details.  Imaging Guidance:  Type of Imaging Technique: None used Indication(s): N/A Exposure Time: No patient exposure Contrast: None used. Fluoroscopic Guidance: N/A Ultrasound Guidance: N/A Interpretation: N/A  Antibiotic Prophylaxis:   Anti-infectives (From admission, onward)   None     Indication(s): None identified  Post-operative Assessment:  Post-procedure Vital Signs:  Pulse/HCG Rate: 64  Temp: 98.8 F (37.1 C) Resp: 18 BP: (!) 122/58 SpO2: 97 %  EBL: None  Complications: No immediate post-treatment complications observed by team, or reported by patient.  Note: The patient tolerated the entire procedure well. A repeat set of vitals were taken after the procedure and the patient was kept under observation following institutional policy, for this type of procedure. Post-procedural neurological assessment was performed, showing return to baseline, prior to discharge. The patient was provided with post-procedure discharge instructions, including a section on how to identify potential problems. Should any problems arise concerning this procedure, the patient was given instructions to immediately contact us, at any time, without hesitation. In any case, we plan to contact the patient by telephone for a follow-up status report regarding this interventional procedure.  Comments:  No additional relevant information.  Plan of Care   Imaging Orders  No imaging studies ordered today    Procedure Orders     PUMP REFILL     PUMP REFILL  Medications ordered for procedure: No orders of the defined types were placed in this encounter.  Medications  administered: [name] had no medications administered during this visit.  See the medical record for exact dosing, route, and time of administration.  New Prescriptions   No medications on file   Disposition: Discharge home  Discharge Date & Time: 09/19/2017; 1030 hrs.   Physician-requested Follow-up: Return for Pump Refill (as per pump  program) (Max:15mo.  Future Appointments  Date Time Provider [place] 11/28/2017 11:00 AM [facility]il[facility]   Primary Care Physician: WIva Lento PA-C [facility] Note by: FGaspar Cola MD Date: 09/19/2017; Time: 11:42 AM  Disclaimer:  Medicine is not an eChief Strategy Officer The only guarantee in medicine is that nothing is guaranteed. It is important to note that the decision to proceed with this intervention was based on the information collected from the patient. The Data and conclusions were drawn from the patient's questionnaire, the interview, and the physical examination. Because the information was provided in large part by the patient, it cannot be guaranteed that it has not been purposely or unconsciously manipulated. Every effort has been made to obtain as much relevant data as possible for this evaluation. It is important to note that the conclusions that lead to this procedure are derived in large part from the available data. Always take into account that the treatment will also be dependent on availability of resources and existing treatment guidelines, considered by other Pain Management Practitioners as being common knowledge and practice, at the time of the intervention. For Medico-Legal purposes, it is also important to point out that variation in procedural techniques and pharmacological choices are the acceptable norm. The indications, contraindications, technique, and results of the above procedure should only be interpreted and judged by a Board-Certified  Interventional Pain Specialist with extensive familiarity and expertise in the same exact procedure and technique.

## 2017-09-20 ENCOUNTER — Telehealth: Payer: Self-pay

## 2017-09-20 NOTE — Telephone Encounter (Signed)
Pt states that she did well with her pump refill and denies any needs. Instructed to call if needed.

## 2017-11-06 ENCOUNTER — Telehealth: Payer: Self-pay

## 2017-11-06 ENCOUNTER — Other Ambulatory Visit: Payer: Self-pay

## 2017-11-06 MED ORDER — PAIN MANAGEMENT IT PUMP REFILL: 1.0000 | Freq: Once | INTRATHECAL | 0 refills | Status: DC

## 2017-11-28 ENCOUNTER — Telehealth: Payer: Self-pay

## 2017-11-28 ENCOUNTER — Ambulatory Visit: Payer: Medicare Other | Attending: Pain Medicine | Admitting: Pain Medicine

## 2017-11-28 ENCOUNTER — Other Ambulatory Visit: Payer: Self-pay

## 2017-11-28 ENCOUNTER — Encounter: Payer: Self-pay | Admitting: Pain Medicine

## 2017-11-28 VITALS — BP 148/56 | HR 75 | Temp 98.9°F | Resp 18 | Ht 62.0 in | Wt 180.0 lb

## 2017-11-28 DIAGNOSIS — Z7952 Long term (current) use of systemic steroids: Secondary | ICD-10-CM | POA: Insufficient documentation

## 2017-11-28 DIAGNOSIS — Z79899 Other long term (current) drug therapy: Secondary | ICD-10-CM | POA: Diagnosis not present

## 2017-11-28 DIAGNOSIS — M47817 Spondylosis without myelopathy or radiculopathy, lumbosacral region: Secondary | ICD-10-CM | POA: Diagnosis not present

## 2017-11-28 DIAGNOSIS — Z9049 Acquired absence of other specified parts of digestive tract: Secondary | ICD-10-CM | POA: Insufficient documentation

## 2017-11-28 DIAGNOSIS — Z9889 Other specified postprocedural states: Secondary | ICD-10-CM | POA: Insufficient documentation

## 2017-11-28 DIAGNOSIS — M47816 Spondylosis without myelopathy or radiculopathy, lumbar region: Secondary | ICD-10-CM

## 2017-11-28 DIAGNOSIS — M5416 Radiculopathy, lumbar region: Secondary | ICD-10-CM | POA: Insufficient documentation

## 2017-11-28 DIAGNOSIS — M545 Low back pain: Secondary | ICD-10-CM | POA: Diagnosis present

## 2017-11-28 DIAGNOSIS — G894 Chronic pain syndrome: Secondary | ICD-10-CM | POA: Insufficient documentation

## 2017-11-28 DIAGNOSIS — Z7982 Long term (current) use of aspirin: Secondary | ICD-10-CM | POA: Diagnosis not present

## 2017-11-28 DIAGNOSIS — Z881 Allergy status to other antibiotic agents status: Secondary | ICD-10-CM | POA: Insufficient documentation

## 2017-11-28 DIAGNOSIS — M961 Postlaminectomy syndrome, not elsewhere classified: Secondary | ICD-10-CM | POA: Diagnosis not present

## 2017-11-28 DIAGNOSIS — G8929 Other chronic pain: Secondary | ICD-10-CM

## 2017-11-28 DIAGNOSIS — Z882 Allergy status to sulfonamides status: Secondary | ICD-10-CM | POA: Insufficient documentation

## 2017-11-28 DIAGNOSIS — M5442 Lumbago with sciatica, left side: Secondary | ICD-10-CM

## 2017-11-28 NOTE — Progress Notes (Signed)
Patient's Name: Tiffany Hunter  MRN: [id number]  Referring Provider: Iva Lento, PA-C  DOB: 02-21-1946  PCP: Iva Lento, PA-C  DOS: 11/28/2017  Note by: Gaspar Cola, MD  Service setting: Ambulatory outpatient  Specialty: Interventional Pain Management  Patient type: Established  Location: [facility] (AMB) Pain Management Facility  Visit type: Interventional Procedure   Primary Reason for Visit: Interventional Pain Management Treatment. CC: Back Pain (lower)  Procedure:          Intrathecal Drug Delivery System (IDDS):  Type: Reservoir Refill [id number]) No rate change Region: Abdominal Laterality: Left  Type of Pump: Medtronic Synchromed II (MRI-compatible) Delivery Route: Intrathecal Type of Pain Treated: Neuropathic/Nociceptive Primary Medication Class: Opioid/opiate  Medication, Concentration, Infusion Program, & Delivery Rate: Please see scanned programming printout.   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 Chronic pain syndrome   3. Spondylosis without myelopathy or radiculopathy, lumbosacral region   4. Lumbar facet joint syndrome (Left)   5. Lumbar spondylosis   6. 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 Chronic low back pain (Primary Area of Pain) (Left)    Pain Assessment: Self-Reported Pain Score: 2 /10             Reported level is compatible with observation.        Intrathecal Pump Therapy Assessment  Manufacturer: Medtronic Synchromed II Type: Programmable Volume: 40 mL reservoir MRI compatibility: Yes   Drug content: Primary Medication Class:Opioid Primary Medication:PF-Hydromorphone (Dilaudid)(8 mg/mL) Secondary Medication:PF-Sufentanil(70 g/mL) Other Medication:none  Programming:  Type: Simple continuous. See pump readout for details.   Changes:  Medication Change: None at this point Rate Change: No change in rate  Reported side-effects or adverse reactions: None  reported  Effectiveness: Described as relatively effective, allowing for increase in activities of daily living (ADL) Clinically meaningful improvement in function (CMIF): Sustained CMIF goals met  Plan: Pump refill today  Pre-op Assessment:  Tiffany Hunter is a 72 y.o. (year old), female patient, seen today for interventional treatment. She  has a past surgical history that includes Carpal tunnel release (Bilateral); Back surgery; Breast surgery (Bilateral); Cholecystectomy; Colonoscopy w/ polypectomy; Cataract extraction w/ intraocular lens implant (Right); Abdominal hysterectomy; Fracture surgery (Right); Cardiac catheterization (2012); and Pain pump revision (Left, 07/07/2017). Tiffany Hunter has a current medication list which includes the following prescription(s): aspirin ec, atorvastatin, carvedilol, cholecalciferol, clotrimazole-betamethasone, hydrocortisone, oxybutynin, albuterol, and PAIN MANAGEMENT IT PUMP REFILL. Her primarily concern today is the Back Pain (lower)  Initial Vital Signs:  Pulse/HCG Rate: 75  Temp: 98.9 F (37.2 C) Resp: 18 BP: (!) 148/56 SpO2: 94 %  BMI: Estimated body mass index is 32.92 kg/m as calculated from the following:   Height as of this encounter: _0  (1.575 m).   Weight as of this encounter: 180 lb (81.6 kg).  Risk Assessment: Allergies: Reviewed. She is allergic to doxycycline hyclate and sulfa antibiotics.  Allergy Precautions: None required Coagulopathies: Reviewed. None identified.  Blood-thinner therapy: None at this time Active Infection(s): Reviewed. None identified. Tiffany Hunter is afebrile  Site Confirmation: Tiffany Hunter was asked to confirm the procedure and laterality before marking the site Procedure checklist: Completed Consent: Before the procedure and under the influence of no sedative(s), amnesic(s), or anxiolytics, the patient was informed of the treatment options, risks and possible complications. To fulfill our ethical and legal  obligations, as recommended by the American Medical Association's Code of Ethics, I have informed the patient of my clinical impression; the nature and  purpose of the treatment or procedure; the risks, benefits, and possible complications of the intervention; the alternatives, including doing nothing; the risk(s) and benefit(s) of the alternative treatment(s) or procedure(s); and the risk(s) and benefit(s) of doing nothing.  Tiffany Hunter was provided with information about the general risks and possible complications associated with most interventional procedures. These include, but are not limited to: failure to achieve desired goals, infection, bleeding, organ or nerve damage, allergic reactions, paralysis, and/or death.  In addition, she was informed of those risks and possible complications associated to this particular procedure, which include, but are not limited to: damage to the implant; failure to decrease pain; local, systemic, or serious CNS infections, intraspinal abscess with possible cord compression and paralysis, or life-threatening such as meningitis; bleeding; organ damage; nerve injury or damage with subsequent sensory, motor, and/or autonomic system dysfunction, resulting in transient or permanent pain, numbness, and/or weakness of one or several areas of the body; allergic reactions, either minor or major life-threatening, such as anaphylactic or anaphylactoid reactions.  Furthermore, Tiffany Hunter was informed of those risks and complications associated with the medications. These include, but are not limited to: allergic reactions (i.e.: anaphylactic or anaphylactoid reactions); endorphine suppression; bradycardia and/or hypotension; water retention and/or peripheral vascular relaxation leading to lower extremity edema and possible stasis ulcers; respiratory depression and/or shortness of breath; decreased metabolic rate leading to weight gain; swelling or edema; medication-induced neural  toxicity; particulate matter embolism and blood vessel occlusion with resultant organ, and/or nervous system infarction; and/or intrathecal granuloma formation with possible spinal cord compression and permanent paralysis.  Before refilling the pump Ms. More was informed that some of the medications used in the devise may not be FDA approved for such use and therefore it constitutes an off-label use of the medications.  Finally, she was informed that Medicine is not an exact science; therefore, there is also the possibility of unforeseen or unpredictable risks and/or possible complications that may result in a catastrophic outcome. The patient indicated having understood very clearly. We have given the patient no guarantees and we have made no promises. Enough time was given to the patient to ask questions, all of which were answered to the patient's satisfaction. Ms. Rosasco has indicated that she wanted to continue with the procedure. Attestation: I, the ordering provider, attest that I have discussed with the patient the benefits, risks, side-effects, alternatives, likelihood of achieving goals, and potential problems during recovery for the procedure that I have provided informed consent. Date  Time: 11/28/2017 11:03 AM  Pre-Procedure Preparation:  Monitoring: As per clinic protocol. Respiration, ETCO2, SpO2, BP, heart rate and rhythm monitor placed and checked for adequate function Safety Precautions: Patient was assessed for positional comfort and pressure points before starting the procedure. Time-out: I initiated and conducted the "Time-out" before starting the procedure, as per protocol. The patient was asked to participate by confirming the accuracy of the "Time Out" information. Verification of the correct person, site, and procedure were performed and confirmed by me, the nursing staff, and the patient. "Time-out" conducted as per Joint Commission's Universal Protocol  (UP.01.01.01). Time: 1112  Description of Procedure:          Position: Supine Target Area: Central-port of intrathecal pump. Approach: Anterior, 90 degree angle approach. Area Prepped: Entire Area around the pump implant. Prepping solution: ChloraPrep (2% chlorhexidine gluconate and 70% isopropyl alcohol) Safety Precautions: Aspiration looking for blood return was conducted prior to all injections. At no point did we inject any substances, as  a needle was being advanced. No attempts were made at seeking any paresthesias. Safe injection practices and needle disposal techniques used. Medications properly checked for expiration dates. SDV (single dose vial) medications used. Description of the Procedure: Protocol guidelines were followed. Two nurses trained to do implant refills were present during the entire procedure. The refill medication was checked by both healthcare providers as well as the patient. The patient was included in the "Time-out" to verify the medication. The patient was placed in position. The pump was identified. The area was prepped in the usual manner. The sterile template was positioned over the pump, making sure the side-port location matched that of the pump. Both, the pump and the template were held for stability. The needle provided in the Medtronic Kit was then introduced thru the center of the template and into the central port. The pump content was aspirated and discarded volume documented. The new medication was slowly infused into the pump, thru the filter, making sure to avoid overpressure of the device. The needle was then removed and the area cleansed, making sure to leave some of the prepping solution back to take advantage of its long term bactericidal properties. The pump was interrogated and programmed to reflect the correct medication, volume, and dosage. The program was printed and taken to the physician for approval. Once checked and signed by the physician, a copy  was provided to the patient and another scanned into the EMR. Vitals:   11/28/17 1101  BP: (!) 148/56  Pulse: 75  Resp: 18  Temp: 98.9 F (37.2 C)  TempSrc: Oral  SpO2: 94%  Weight: 180 lb (81.6 kg)  Height: _0  (1.575 m)    Start Time: 1118 hrs. End Time: 1125 hrs. Materials & Medications: Medtronic Refill Kit Medication(s): Please see chart orders for details.  Imaging Guidance:          Type of Imaging Technique: None used Indication(s): N/A Exposure Time: No patient exposure Contrast: None used. Fluoroscopic Guidance: N/A Ultrasound Guidance: N/A Interpretation: N/A  Antibiotic Prophylaxis:   Anti-infectives (From admission, onward)   None     Indication(s): None identified  Post-operative Assessment:  Post-procedure Vital Signs:  Pulse/HCG Rate: 75  Temp: 98.9 F (37.2 C) Resp: 18 BP: (!) 148/56 SpO2: 94 %  EBL: None  Complications: No immediate post-treatment complications observed by team, or reported by patient.  Note: The patient tolerated the entire procedure well. A repeat set of vitals were taken after the procedure and the patient was kept under observation following institutional policy, for this type of procedure. Post-procedural neurological assessment was performed, showing return to baseline, prior to discharge. The patient was provided with post-procedure discharge instructions, including a section on how to identify potential problems. Should any problems arise concerning this procedure, the patient was given instructions to immediately contact us, at any time, without hesitation. In any case, we plan to contact the patient by telephone for a follow-up status report regarding this interventional procedure.  Comments:  No additional relevant information.  Plan of Care   Imaging Orders  No imaging studies ordered today    Procedure Orders     PUMP REFILL     PUMP REFILL  Medications ordered for procedure: No orders of the defined  types were placed in this encounter.  Medications administered: [name] had no medications administered during this visit.  See the medical record for exact dosing, route, and time of administration.  New Prescriptions   No medications on  file   Disposition: Discharge home  Discharge Date & Time: 11/28/2017; 1145 hrs.   Physician-requested Follow-up: Return for Pump Refill (as per pump program) (Max:37mo.  Future Appointments  Date Time Provider [place] 02/22/2018 11:00 AM [facility]ilinda Pointer [facility]   Primary Care Physician: WIva Lento PA-C [facility] Note by: FGaspar Cola MD Date: 11/28/2017; Time: 12:23 PM  Disclaimer:  Medicine is not an eChief Strategy Officer The only guarantee in medicine is that nothing is guaranteed. It is important to note that the decision to proceed with this intervention was based on the information collected from the patient. The Data and conclusions were drawn from the patient's questionnaire, the interview, and the physical examination. Because the information was provided in large part by the patient, it cannot be guaranteed that it has not been purposely or unconsciously manipulated. Every effort has been made to obtain as much relevant data as possible for this evaluation. It is important to note that the conclusions that lead to this procedure are derived in large part from the available data. Always take into account that the treatment will also be dependent on availability of resources and existing treatment guidelines, considered by other Pain Management Practitioners as being common knowledge and practice, at the time of the intervention. For Medico-Legal purposes, it is also important to point out that variation in procedural techniques and pharmacological choices are the acceptable norm. The indications, contraindications, technique, and results of the above procedure should  only be interpreted and judged by a Board-Certified Interventional Pain Specialist with extensive familiarity and expertise in the same exact procedure and technique.

## 2017-11-28 NOTE — Addendum Note (Signed)
Addended by: Landis Martins on: 11/28/2017 03:53 PM   Modules accepted: Orders

## 2017-11-28 NOTE — Progress Notes (Signed)
Safety precautions to be maintained throughout the outpatient stay will include: orient to surroundings, keep bed in low position, maintain call bell within reach at all times, provide assistance with transfer out of bed and ambulation.  

## 2017-11-29 NOTE — Telephone Encounter (Signed)
error 

## 2017-11-30 MED FILL — Medication: INTRATHECAL | Qty: 1 | Status: AC

## 2018-01-02 ENCOUNTER — Other Ambulatory Visit: Payer: Self-pay

## 2018-01-02 MED ORDER — PAIN MANAGEMENT IT PUMP REFILL: 1.0000 | Freq: Once | INTRATHECAL | 0 refills | Status: DC

## 2018-01-31 ENCOUNTER — Ambulatory Visit: Payer: Medicare Other | Attending: Pain Medicine | Admitting: Pain Medicine

## 2018-01-31 ENCOUNTER — Other Ambulatory Visit: Payer: Self-pay

## 2018-01-31 ENCOUNTER — Encounter: Payer: Self-pay | Admitting: Pain Medicine

## 2018-01-31 VITALS — BP 143/80 | HR 72 | Temp 98.4°F | Resp 18 | Ht 62.0 in | Wt 180.0 lb

## 2018-01-31 DIAGNOSIS — Z451 Encounter for adjustment and management of infusion pump: Secondary | ICD-10-CM | POA: Diagnosis not present

## 2018-01-31 DIAGNOSIS — M5416 Radiculopathy, lumbar region: Secondary | ICD-10-CM | POA: Insufficient documentation

## 2018-01-31 DIAGNOSIS — Z90710 Acquired absence of both cervix and uterus: Secondary | ICD-10-CM | POA: Insufficient documentation

## 2018-01-31 DIAGNOSIS — G894 Chronic pain syndrome: Secondary | ICD-10-CM | POA: Insufficient documentation

## 2018-01-31 DIAGNOSIS — Z9049 Acquired absence of other specified parts of digestive tract: Secondary | ICD-10-CM | POA: Diagnosis not present

## 2018-01-31 DIAGNOSIS — Z882 Allergy status to sulfonamides status: Secondary | ICD-10-CM | POA: Insufficient documentation

## 2018-01-31 DIAGNOSIS — Z7982 Long term (current) use of aspirin: Secondary | ICD-10-CM | POA: Insufficient documentation

## 2018-01-31 DIAGNOSIS — M961 Postlaminectomy syndrome, not elsewhere classified: Secondary | ICD-10-CM | POA: Insufficient documentation

## 2018-01-31 DIAGNOSIS — M79605 Pain in left leg: Secondary | ICD-10-CM

## 2018-01-31 DIAGNOSIS — Z79899 Other long term (current) drug therapy: Secondary | ICD-10-CM | POA: Insufficient documentation

## 2018-01-31 DIAGNOSIS — G8929 Other chronic pain: Secondary | ICD-10-CM

## 2018-01-31 DIAGNOSIS — Z978 Presence of other specified devices: Secondary | ICD-10-CM

## 2018-01-31 DIAGNOSIS — Z881 Allergy status to other antibiotic agents status: Secondary | ICD-10-CM | POA: Diagnosis not present

## 2018-01-31 DIAGNOSIS — Z961 Presence of intraocular lens: Secondary | ICD-10-CM | POA: Diagnosis not present

## 2018-01-31 DIAGNOSIS — Z9841 Cataract extraction status, right eye: Secondary | ICD-10-CM | POA: Diagnosis not present

## 2018-01-31 DIAGNOSIS — M5442 Lumbago with sciatica, left side: Secondary | ICD-10-CM

## 2018-01-31 NOTE — Progress Notes (Signed)
Safety precautions to be maintained throughout the outpatient stay will include: orient to surroundings, keep bed in low position, maintain call bell within reach at all times, provide assistance with transfer out of bed and ambulation.  

## 2018-01-31 NOTE — Progress Notes (Signed)
Patient's Name: Tiffany Hunter  MRN: [id number]  Referring Provider: Iva Lento, PA-C  DOB: 1945-06-28  PCP: Iva Lento, PA-C  DOS: 01/31/2018  Note by: Gaspar Cola, MD  Service setting: Ambulatory outpatient  Specialty: Interventional Pain Management  Patient type: Established  Location: [facility] (AMB) Pain Management Facility  Visit type: Interventional Procedure   Primary Reason for Visit: Interventional Pain Management Treatment. CC: Back Pain (low)  Procedure:          Intrathecal Drug Delivery System (IDDS):  Type: Reservoir Refill [phone]) No rate change Region: Abdominal Laterality: Left  Type of Pump: Medtronic Synchromed II (MRI-compatible) Delivery Route: Intrathecal Type of Pain Treated: Neuropathic/Nociceptive Primary Medication Class: Opioid/opiate  Medication, Concentration, Infusion Program, & Delivery Rate: Please see scanned programming printout.   Indications: 1. Chronic pain syndrome   2. Chronic low back pain (Primary Area of Pain) (Left)   3. Chronic lower extremity pain (Secondary Area of pain) (Left)   4. 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 Encounter for adjustment or management of infusion pump    Pain Assessment: Self-Reported Pain Score: 7 /10             Reported level is compatible with observation.        Intrathecal Pump Therapy Assessment  Manufacturer: Medtronic Synchromed II Type: Programmable Volume: 40 mL reservoir MRI compatibility: Yes   Drug content:  Primary Medication Class:Opioid Primary Medication:PF-Hydromorphone (Dilaudid)(8 mg/mL) Secondary Medication:PF-Sufentanil(70 g/mL) Other Medication:none   Programming:  Type: Simple continuous. See pump readout for details.   Changes:  Medication Change: None at this point Rate Change: No change in rate  Reported  side-effects or adverse reactions: None reported  Effectiveness: Described as relatively effective, allowing for increase in activities of daily living (ADL) Clinically meaningful improvement in function (CMIF): Sustained CMIF goals met  Plan: Pump refill today  Pre-op Assessment:  Tiffany Hunter is a 72 y.o. (year old), female patient, seen today for interventional treatment. She  has a past surgical history that includes Carpal tunnel release (Bilateral); Back surgery; Breast surgery (Bilateral); Cholecystectomy; Colonoscopy w/ polypectomy; Cataract extraction w/ intraocular lens implant (Right); Abdominal hysterectomy; Fracture surgery (Right); Cardiac catheterization (2012); and Pain pump revision (Left, 07/07/2017). Tiffany Hunter has a current medication list which includes the following prescription(s): albuterol, aspirin ec, atorvastatin, carvedilol, cholecalciferol, clotrimazole-betamethasone, hydrocortisone, oxybutynin, and PAIN MANAGEMENT IT PUMP REFILL. Her primarily concern today is the Back Pain (low)  Initial Vital Signs:  Pulse/HCG Rate: 72  Temp: 98.4 F (36.9 C) Resp: 18 BP: (!) 143/80 SpO2: 97 %  BMI: Estimated body mass index is 32.92 kg/m as calculated from the following:   Height as of this encounter: '5\' 2"'  (1.575 m).   Weight as of this encounter: 180 lb (81.6 kg).  Risk Assessment: Allergies: Reviewed. She is allergic to doxycycline hyclate and sulfa antibiotics.  Allergy Precautions: None required Coagulopathies: Reviewed. None identified.  Blood-thinner therapy: None at this time Active Infection(s): Reviewed. None identified. Tiffany Hunter is afebrile  Site Confirmation: Tiffany Hunter was asked to confirm the procedure and laterality before marking the site Procedure checklist: Completed Consent: Before the procedure and under the influence of no sedative(s), amnesic(s), or anxiolytics, the patient was informed of the treatment options, risks and possible  complications. To fulfill our ethical and legal obligations, as recommended by the American Medical Association's Code of  Ethics, I have informed the patient of my clinical impression; the nature and purpose of the treatment or procedure; the risks, benefits, and possible complications of the intervention; the alternatives, including doing nothing; the risk(s) and benefit(s) of the alternative treatment(s) or procedure(s); and the risk(s) and benefit(s) of doing nothing.  Tiffany Hunter was provided with information about the general risks and possible complications associated with most interventional procedures. These include, but are not limited to: failure to achieve desired goals, infection, bleeding, organ or nerve damage, allergic reactions, paralysis, and/or death.  In addition, she was informed of those risks and possible complications associated to this particular procedure, which include, but are not limited to: damage to the implant; failure to decrease pain; local, systemic, or serious CNS infections, intraspinal abscess with possible cord compression and paralysis, or life-threatening such as meningitis; bleeding; organ damage; nerve injury or damage with subsequent sensory, motor, and/or autonomic system dysfunction, resulting in transient or permanent pain, numbness, and/or weakness of one or several areas of the body; allergic reactions, either minor or major life-threatening, such as anaphylactic or anaphylactoid reactions.  Furthermore, Tiffany Hunter was informed of those risks and complications associated with the medications. These include, but are not limited to: allergic reactions (i.e.: anaphylactic or anaphylactoid reactions); endorphine suppression; bradycardia and/or hypotension; water retention and/or peripheral vascular relaxation leading to lower extremity edema and possible stasis ulcers; respiratory depression and/or shortness of breath; decreased metabolic rate leading to weight  gain; swelling or edema; medication-induced neural toxicity; particulate matter embolism and blood vessel occlusion with resultant organ, and/or nervous system infarction; and/or intrathecal granuloma formation with possible spinal cord compression and permanent paralysis.  Before refilling the pump Ms. Santalucia was informed that some of the medications used in the devise may not be FDA approved for such use and therefore it constitutes an off-label use of the medications.  Finally, she was informed that Medicine is not an exact science; therefore, there is also the possibility of unforeseen or unpredictable risks and/or possible complications that may result in a catastrophic outcome. The patient indicated having understood very clearly. We have given the patient no guarantees and we have made no promises. Enough time was given to the patient to ask questions, all of which were answered to the patient's satisfaction. Ms. Erlich has indicated that she wanted to continue with the procedure. Attestation: I, the ordering provider, attest that I have discussed with the patient the benefits, risks, side-effects, alternatives, likelihood of achieving goals, and potential problems during recovery for the procedure that I have provided informed consent. Date  Time: 01/31/2018 11:04 AM  Pre-Procedure Preparation:  Monitoring: As per clinic protocol. Respiration, ETCO2, SpO2, BP, heart rate and rhythm monitor placed and checked for adequate function Safety Precautions: Patient was assessed for positional comfort and pressure points before starting the procedure. Time-out: I initiated and conducted the "Time-out" before starting the procedure, as per protocol. The patient was asked to participate by confirming the accuracy of the "Time Out" information. Verification of the correct person, site, and procedure were performed and confirmed by me, the nursing staff, and the patient. "Time-out" conducted as per Joint  Commission's Universal Protocol (UP.01.01.01). Time: 1139  Description of Procedure:          Position: Supine Target Area: Central-port of intrathecal pump. Approach: Anterior, 90 degree angle approach. Area Prepped: Entire Area around the pump implant. Prepping solution: ChloraPrep (2% chlorhexidine gluconate and 70% isopropyl alcohol) Safety Precautions: Aspiration looking for blood return was conducted  prior to all injections. At no point did we inject any substances, as a needle was being advanced. No attempts were made at seeking any paresthesias. Safe injection practices and needle disposal techniques used. Medications properly checked for expiration dates. SDV (single dose vial) medications used. Description of the Procedure: Protocol guidelines were followed. Two nurses trained to do implant refills were present during the entire procedure. The refill medication was checked by both healthcare providers as well as the patient. The patient was included in the "Time-out" to verify the medication. The patient was placed in position. The pump was identified. The area was prepped in the usual manner. The sterile template was positioned over the pump, making sure the side-port location matched that of the pump. Both, the pump and the template were held for stability. The needle provided in the Medtronic Kit was then introduced thru the center of the template and into the central port. The pump content was aspirated and discarded volume documented. The new medication was slowly infused into the pump, thru the filter, making sure to avoid overpressure of the device. The needle was then removed and the area cleansed, making sure to leave some of the prepping solution back to take advantage of its long term bactericidal properties. The pump was interrogated and programmed to reflect the correct medication, volume, and dosage. The program was printed and taken to the physician for approval. Once checked and  signed by the physician, a copy was provided to the patient and another scanned into the EMR. Vitals:   01/31/18 1103 01/31/18 1104  BP:  (!) 143/80  Pulse:  72  Resp:  18  Temp:  98.4 F (36.9 C)  SpO2:  97%  Weight: 180 lb (81.6 kg)   Height: '5\' 2"'  (1.575 m)     Start Time: 1139 hrs. End Time: 1148 hrs. Materials & Medications: Medtronic Refill Kit Medication(s): Please see chart orders for details.  Imaging Guidance:          Type of Imaging Technique: None used Indication(s): N/A Exposure Time: No patient exposure Contrast: None used. Fluoroscopic Guidance: N/A Ultrasound Guidance: N/A Interpretation: N/A  Antibiotic Prophylaxis:   Anti-infectives (From admission, onward)   None     Indication(s): None identified  Post-operative Assessment:  Post-procedure Vital Signs:  Pulse/HCG Rate: 72  Temp: 98.4 F (36.9 C) Resp: 18 BP: (!) 143/80 SpO2: 97 %  EBL: None  Complications: No immediate post-treatment complications observed by team, or reported by patient.  Note: The patient tolerated the entire procedure well. A repeat set of vitals were taken after the procedure and the patient was kept under observation following institutional policy, for this type of procedure. Post-procedural neurological assessment was performed, showing return to baseline, prior to discharge. The patient was provided with post-procedure discharge instructions, including a section on how to identify potential problems. Should any problems arise concerning this procedure, the patient was given instructions to immediately contact us, at any time, without hesitation. In any case, we plan to contact the patient by telephone for a follow-up status report regarding this interventional procedure.  Comments:  No additional relevant information.  Plan of Care   Imaging Orders  No imaging studies ordered today   Procedure Orders    No procedure(s) ordered today    Medications ordered for  procedure: No orders of the defined types were placed in this encounter.  Medications administered: [name] had no medications administered during this visit.  See the medical record for  exact dosing, route, and time of administration.  New Prescriptions   No medications on file   Disposition: Discharge home  Discharge Date & Time: 01/31/2018; 1156 hrs.   Physician-requested Follow-up: Return for Pump Refill (as per pump program) (Max:2mo.  Future Appointments  Date Time Provider DGross 04/05/2018 11:00 AM [facility] [place]   Primary Care Physician: WIva Lento PA-C [facility] Note by: FGaspar Cola MD Date: 01/31/2018; Time: 1:10 PM  Disclaimer:  Medicine is not an exact science. The only guarantee in medicine is that nothing is guaranteed. It is important to note that the decision to proceed with this intervention was based on the information collected from the patient. The Data and conclusions were drawn from the patient's questionnaire, the interview, and the physical examination. Because the information was provided in large part by the patient, it cannot be guaranteed that it has not been purposely or unconsciously manipulated. Every effort has been made to obtain as much relevant data as possible for this evaluation. It is important to note that the conclusions that lead to this procedure are derived in large part from the available data. Always take into account that the treatment will also be dependent on availability of resources and existing treatment guidelines, considered by other Pain Management Practitioners as being common knowledge and practice, at the time of the intervention. For Medico-Legal purposes, it is also important to point out that variation in procedural techniques and pharmacological choices are the acceptable norm. The indications, contraindications, technique, and  results of the above procedure should only be interpreted and judged by a Board-Certified Interventional Pain Specialist with extensive familiarity and expertise in the same exact procedure and technique.

## 2018-02-12 MED FILL — Medication: INTRATHECAL | Qty: 1 | Status: AC

## 2018-02-22 ENCOUNTER — Encounter: Payer: Medicare Other | Admitting: Pain Medicine

## 2018-03-12 ENCOUNTER — Other Ambulatory Visit: Payer: Self-pay

## 2018-03-12 MED ORDER — PAIN MANAGEMENT IT PUMP REFILL: 1.0000 | Freq: Once | INTRATHECAL | 0 refills | Status: DC

## 2018-04-05 ENCOUNTER — Encounter: Payer: Self-pay | Admitting: Pain Medicine

## 2018-04-05 ENCOUNTER — Ambulatory Visit: Payer: Medicare Other | Attending: Pain Medicine | Admitting: Pain Medicine

## 2018-04-05 ENCOUNTER — Other Ambulatory Visit: Payer: Self-pay

## 2018-04-05 VITALS — BP 157/74 | HR 65 | Temp 99.0°F | Resp 18 | Ht 62.0 in | Wt 180.0 lb

## 2018-04-05 DIAGNOSIS — Z882 Allergy status to sulfonamides status: Secondary | ICD-10-CM | POA: Diagnosis not present

## 2018-04-05 DIAGNOSIS — G894 Chronic pain syndrome: Secondary | ICD-10-CM

## 2018-04-05 DIAGNOSIS — Z451 Encounter for adjustment and management of infusion pump: Secondary | ICD-10-CM | POA: Insufficient documentation

## 2018-04-05 DIAGNOSIS — M961 Postlaminectomy syndrome, not elsewhere classified: Secondary | ICD-10-CM | POA: Diagnosis not present

## 2018-04-05 DIAGNOSIS — M5442 Lumbago with sciatica, left side: Secondary | ICD-10-CM

## 2018-04-05 DIAGNOSIS — M5137 Other intervertebral disc degeneration, lumbosacral region: Secondary | ICD-10-CM | POA: Insufficient documentation

## 2018-04-05 DIAGNOSIS — Z881 Allergy status to other antibiotic agents status: Secondary | ICD-10-CM | POA: Insufficient documentation

## 2018-04-05 DIAGNOSIS — Z978 Presence of other specified devices: Secondary | ICD-10-CM

## 2018-04-05 DIAGNOSIS — Z79899 Other long term (current) drug therapy: Secondary | ICD-10-CM | POA: Insufficient documentation

## 2018-04-05 DIAGNOSIS — G8929 Other chronic pain: Secondary | ICD-10-CM

## 2018-04-05 DIAGNOSIS — J029 Acute pharyngitis, unspecified: Secondary | ICD-10-CM | POA: Diagnosis not present

## 2018-04-05 DIAGNOSIS — Z7982 Long term (current) use of aspirin: Secondary | ICD-10-CM | POA: Insufficient documentation

## 2018-04-05 DIAGNOSIS — M549 Dorsalgia, unspecified: Secondary | ICD-10-CM | POA: Diagnosis present

## 2018-04-05 DIAGNOSIS — R509 Fever, unspecified: Secondary | ICD-10-CM | POA: Insufficient documentation

## 2018-04-05 MED ORDER — CEFAZOLIN SODIUM 1 G IJ SOLR
INTRAMUSCULAR | Status: AC
  Filled 2018-04-05: qty 10

## 2018-04-05 MED ORDER — LACTATED RINGERS IV SOLN
1000.0000 mL | Freq: Once | INTRAVENOUS | Status: AC
  Administered 2018-04-05: 1000 mL via INTRAVENOUS

## 2018-04-05 MED ORDER — CEFAZOLIN SODIUM-DEXTROSE 1-4 GM/50ML-% IV SOLN
1.0000 g | Freq: Once | INTRAVENOUS | Status: AC
  Administered 2018-04-05: 1 g via INTRAVENOUS

## 2018-04-05 NOTE — Progress Notes (Signed)
Patient's Name: Tiffany Hunter  MRN: [id number]  Referring Provider: Iva Lento, PA-C  DOB: [birth year]  PCP: Iva Lento, PA-C  DOS: 04/05/2018  Note by: Gaspar Cola, MD  Service setting: Ambulatory outpatient  Specialty: Interventional Pain Management  Patient type: Established  Location: [facility] (AMB) Pain Management Facility  Visit type: Interventional Procedure   Primary Reason for Visit: Interventional Pain Management Treatment. CC: Back Pain  Procedure:          Intrathecal Drug Delivery System (IDDS):  Type: Reservoir Refill [phone]) No rate change Region: Abdominal Laterality: Left  Type of Pump: Medtronic Synchromed II (MRI-compatible) Delivery Route: Intrathecal Type of Pain Treated: Neuropathic/Nociceptive Primary Medication Class: Opioid/opiate  Medication, Concentration, Infusion Program, & Delivery Rate: Please see scanned programming printout.   Indications: 1. Chronic pain syndrome   2. Chronic low back pain (Primary Area of Pain) (Left)   3. 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 Encounter for adjustment or management of infusion pump   6. DDD (degenerative disc disease), lumbosacral   7. Fever with sore throat    Pain Assessment: Self-Reported Pain Score: 8 /10             Reported level is compatible with observation.        Today the patient comes in for an intrathecal pump refill.  She has a low-grade fever of 99.1 F and she indicates that the day before yesterday she started with a sore throat.  She is currently not taking any kind of antibiotics.  As a preventive measure, we will get the patient 1 g of  Ancef IV, prior to attempting to access the intrathecal pump.  The patient was informed of the risk of having an organism moving to the intrathecal pump with possible contamination of the pump and also the possibility of a meningitis.  The patient indicated  that she wanted to proceed and she indicated that "God will take care of her".  Intrathecal Pump Therapy Assessment  Manufacturer: Medtronic Synchromed II Type: Programmable Volume: 40 mL reservoir MRI compatibility: Yes   Drug content:  Primary Medication Class:Opioid Primary Medication:PF-Hydromorphone (Dilaudid)(52m/mL) Secondary Medication:PF-Sufentanil(70 g/mL) Other Medication:none   Programming:  Type: Simple continuous. See pump readout for details.   Changes:  Medication Change: None at this point Rate Change: No change in rate  Reported side-effects or adverse reactions: None reported  Effectiveness: Described as relatively effective, allowing for increase in activities of daily living (ADL) Clinically meaningful improvement in function (CMIF): Sustained CMIF goals met  Plan: Pump refill today  Pre-op Assessment:  Tiffany Hunter a 72y.o. (year old), female patient, seen today for interventional treatment. She  has a past surgical history that includes Carpal tunnel release (Bilateral); Back surgery; Breast surgery (Bilateral); Cholecystectomy; Colonoscopy w/ polypectomy; Cataract extraction w/ intraocular lens implant (Right); Abdominal hysterectomy; Fracture surgery (Right); Cardiac catheterization (2012); and Pain pump revision (Left, 07/07/2017). Ms. MDurosshas a current medication list which includes the following prescription(s): aspirin ec, atorvastatin, carvedilol, cholecalciferol, clotrimazole-betamethasone, hydrocortisone, oxybutynin, PAIN MANAGEMENT IT PUMP REFILL, and albuterol, and the following Facility-Administered Medications: lactated ringers. Her primarily concern today is the Back Pain  Initial Vital Signs:  Pulse/HCG Rate: 65  Temp: 99 F (37.2 C) Resp: 18 BP: (!) 157/74 SpO2: 100 %  BMI: Estimated body mass index is 32.92 kg/m as calculated from the following:   Height as of this encounter: _0  (1.575  m).   Weight as of this  encounter: 180 lb (81.6 kg).  Risk Assessment: Allergies: Reviewed. She is allergic to doxycycline hyclate and sulfa antibiotics.  Allergy Precautions: None required Coagulopathies: Reviewed. None identified.  Blood-thinner therapy: None at this time Active Infection(s): Reviewed. None identified. Tiffany Hunter is afebrile  Site Confirmation: Tiffany Hunter was asked to confirm the procedure and laterality before marking the site Procedure checklist: Completed Consent: Before the procedure and under the influence of no sedative(s), amnesic(s), or anxiolytics, the patient was informed of the treatment options, risks and possible complications. To fulfill our ethical and legal obligations, as recommended by the American Medical Association's Code of Ethics, I have informed the patient of my clinical impression; the nature and purpose of the treatment or procedure; the risks, benefits, and possible complications of the intervention; the alternatives, including doing nothing; the risk(s) and benefit(s) of the alternative treatment(s) or procedure(s); and the risk(s) and benefit(s) of doing nothing.  Tiffany Hunter was provided with information about the general risks and possible complications associated with most interventional procedures. These include, but are not limited to: failure to achieve desired goals, infection, bleeding, organ or nerve damage, allergic reactions, paralysis, and/or death.  In addition, she was informed of those risks and possible complications associated to this particular procedure, which include, but are not limited to: damage to the implant; failure to decrease pain; local, systemic, or serious CNS infections, intraspinal abscess with possible cord compression and paralysis, or life-threatening such as meningitis; bleeding; organ damage; nerve injury or damage with subsequent sensory, motor, and/or autonomic system dysfunction, resulting in transient or permanent pain, numbness,  and/or weakness of one or several areas of the body; allergic reactions, either minor or major life-threatening, such as anaphylactic or anaphylactoid reactions.  Furthermore, Tiffany Hunter was informed of those risks and complications associated with the medications. These include, but are not limited to: allergic reactions (i.e.: anaphylactic or anaphylactoid reactions); endorphine suppression; bradycardia and/or hypotension; water retention and/or peripheral vascular relaxation leading to lower extremity edema and possible stasis ulcers; respiratory depression and/or shortness of breath; decreased metabolic rate leading to weight gain; swelling or edema; medication-induced neural toxicity; particulate matter embolism and blood vessel occlusion with resultant organ, and/or nervous system infarction; and/or intrathecal granuloma formation with possible spinal cord compression and permanent paralysis.  Before refilling the pump Ms. Narramore was informed that some of the medications used in the devise may not be FDA approved for such use and therefore it constitutes an off-label use of the medications.  Finally, she was informed that Medicine is not an exact science; therefore, there is also the possibility of unforeseen or unpredictable risks and/or possible complications that may result in a catastrophic outcome. The patient indicated having understood very clearly. We have given the patient no guarantees and we have made no promises. Enough time was given to the patient to ask questions, all of which were answered to the patient's satisfaction. Ms. Belmonte has indicated that she wanted to continue with the procedure. Attestation: I, the ordering provider, attest that I have discussed with the patient the benefits, risks, side-effects, alternatives, likelihood of achieving goals, and potential problems during recovery for the procedure that I have provided informed consent. Date  Time: 04/05/2018 11:12  AM  Pre-Procedure Preparation:  Monitoring: As per clinic protocol. Respiration, ETCO2, SpO2, BP, heart rate and rhythm monitor placed and checked for adequate function Safety Precautions: Patient was assessed for positional comfort and pressure points before starting the procedure.  Time-out: I initiated and conducted the "Time-out" before starting the procedure, as per protocol. The patient was asked to participate by confirming the accuracy of the "Time Out" information. Verification of the correct person, site, and procedure were performed and confirmed by me, the nursing staff, and the patient. "Time-out" conducted as per Joint Commission's Universal Protocol (UP.01.01.01). Time: 1118  Description of Procedure:          Position: Supine Target Area: Central-port of intrathecal pump. Approach: Anterior, 90 degree angle approach. Area Prepped: Entire Area around the pump implant. Prepping solution: ChloraPrep (2% chlorhexidine gluconate and 70% isopropyl alcohol) Safety Precautions: Aspiration looking for blood return was conducted prior to all injections. At no point did we inject any substances, as a needle was being advanced. No attempts were made at seeking any paresthesias. Safe injection practices and needle disposal techniques used. Medications properly checked for expiration dates. SDV (single dose vial) medications used. Description of the Procedure: Protocol guidelines were followed. Two nurses trained to do implant refills were present during the entire procedure. The refill medication was checked by both healthcare providers as well as the patient. The patient was included in the "Time-out" to verify the medication. The patient was placed in position. The pump was identified. The area was prepped in the usual manner. The sterile template was positioned over the pump, making sure the side-port location matched that of the pump. Both, the pump and the template were held for stability. The  needle provided in the Medtronic Kit was then introduced thru the center of the template and into the central port. The pump content was aspirated and discarded volume documented. The new medication was slowly infused into the pump, thru the filter, making sure to avoid overpressure of the device. The needle was then removed and the area cleansed, making sure to leave some of the prepping solution back to take advantage of its long term bactericidal properties. The pump was interrogated and programmed to reflect the correct medication, volume, and dosage. The program was printed and taken to the physician for approval. Once checked and signed by the physician, a copy was provided to the patient and another scanned into the EMR. Vitals:   04/05/18 1111  BP: (!) 157/74  Pulse: 65  Resp: 18  Temp: 99 F (37.2 C)  SpO2: 100%  Weight: 180 lb (81.6 kg)  Height: _0  (1.575 m)    Start Time: 1118 hrs. End Time: 1156 hrs. Materials & Medications: Medtronic Refill Kit Medication(s): Please see chart orders for details.  Imaging Guidance:          Type of Imaging Technique: None used Indication(s): N/A Exposure Time: No patient exposure Contrast: None used. Fluoroscopic Guidance: N/A Ultrasound Guidance: N/A Interpretation: N/A  Antibiotic Prophylaxis:   Anti-infectives (From admission, onward)   Start     Dose/Rate Route Frequency Ordered Stop   04/05/18 1145  ceFAZolin (ANCEF) IVPB 1 g/50 mL premix     1 g 100 mL/hr over 30 Minutes Intravenous  Once 04/05/18 1141 04/05/18 1212     Indication(s): None identified  Post-operative Assessment:  Post-procedure Vital Signs:  Pulse/HCG Rate: 65  Temp: 99 F (37.2 C) Resp: 18 BP: (!) 157/74 SpO2: 100 %  EBL: None  Complications: No immediate post-treatment complications observed by team, or reported by patient.  Note: The patient tolerated the entire procedure well. A repeat set of vitals were taken after the procedure and the  patient was kept under observation following institutional  policy, for this type of procedure. Post-procedural neurological assessment was performed, showing return to baseline, prior to discharge. The patient was provided with post-procedure discharge instructions, including a section on how to identify potential problems. Should any problems arise concerning this procedure, the patient was given instructions to immediately contact us, at any time, without hesitation. In any case, we plan to contact the patient by telephone for a follow-up status report regarding this interventional procedure.  Comments:  No additional relevant information.  Plan of Care   Imaging Orders  No imaging studies ordered today    Procedure Orders     PUMP REFILL     PUMP REFILL  Medications ordered for procedure: Meds ordered this encounter  Medications  . lactated ringers infusion 1,000 mL  . ceFAZolin (ANCEF) IVPB 1 g/50 mL premix    Order Specific Question:   Antibiotic Indication:    Answer:   Surgical Prophylaxis   Medications administered: We administered lactated ringers and ceFAZolin.  See the medical record for exact dosing, route, and time of administration.  Disposition: Discharge home  Discharge Date & Time: 04/05/2018; 1210 hrs.   Physician-requested Follow-up: Return for Pump Refill (as per pump program) (Max:47mo.  Future Appointments  Date Time Provider DFirebaugh 06/05/2018 11:00 AM [place]   Primary Care Physician: WIva Lento PA-C [facility] Note by: FGaspar Cola MD Date: 04/05/2018; Time: 12:17 PM  Disclaimer:  Medicine is not an exact science. The only guarantee in medicine is that nothing is guaranteed. It is important to note that the decision to proceed with this intervention was based on the information collected from the patient. The Data and conclusions were drawn from the  patient's questionnaire, the interview, and the physical examination. Because the information was provided in large part by the patient, it cannot be guaranteed that it has not been purposely or unconsciously manipulated. Every effort has been made to obtain as much relevant data as possible for this evaluation. It is important to note that the conclusions that lead to this procedure are derived in large part from the available data. Always take into account that the treatment will also be dependent on availability of resources and existing treatment guidelines, considered by other Pain Management Practitioners as being common knowledge and practice, at the time of the intervention. For Medico-Legal purposes, it is also important to point out that variation in procedural techniques and pharmacological choices are the acceptable norm. The indications, contraindications, technique, and results of the above procedure should only be interpreted and judged by a Board-Certified Interventional Pain Specialist with extensive familiarity and expertise in the same exact procedure and technique.

## 2018-04-05 NOTE — Patient Instructions (Signed)
Opioid Overdose Opioids are substances that relieve pain by binding to pain receptors in your brain and spinal cord. Opioids include illegal drugs, such as heroin, as well as prescription pain medicines.An opioid overdose happens when you take too much of an opioid substance. This can happen with any type of opioid, including:  Heroin.  Morphine.  Codeine.  Methadone.  Oxycodone.  Hydrocodone.  Fentanyl.  Hydromorphone.  Buprenorphine.  The effects of an overdose can be mild, dangerous, or even deadly. Opioid overdose is a medical emergency. What are the causes? This condition may be caused by:  Taking too much of an opioid by accident.  Taking too much of an opioid on purpose.  An error made by a health care provider who prescribes a medicine.  An error made by the pharmacist who fills the prescription order.  Using more than one substance that contains opioids at the same time.  Mixing an opioid with a substance that affects your heart, breathing, or blood pressure. These include alcohol, tranquilizers, sleeping pills, illegal drugs, and some over-the-counter medicines.  What increases the risk? This condition is more likely in:  Children. They may be attracted to colorful pills. Because of a child's small size, even a small amount of a drug can be dangerous.  Elderly people. They may be taking many different drugs. Elderly people may have difficulty reading labels or remembering when they last took their medicine.  People who take an opioid on a long-term basis.  People who use: ? Illegal drugs. ? Other substances, including alcohol, while using an opioid.  People who have: ? A history of drug or alcohol abuse. ? Certain mental health conditions.  People who take opioids that are not prescribed for them.  What are the signs or symptoms? Symptoms of this condition depend on the type of opioid and the amount that was taken. Common symptoms  include:  Sleepiness or difficulty waking from sleep.  Confusion.  Slurred speech.  Slowed breathing and a slow pulse.  Nausea and vomiting.  Abnormally small pupils.  Signs and symptoms that require emergency treatment include:  Cold, clammy, and pale skin.  Blue lips and fingernails.  Vomiting.  Gurgling sounds in the throat.  A pulse that is very slow or difficult to detect.  Breathing that is very slow, noisy, or difficult to detect.  Limp body.  Inability to respond to speech or be awakened from sleep (stupor).  How is this diagnosed? This condition is diagnosed based on your symptoms. It is important to tell your health care provider:  All of the opioidsthat you took.  When you took the opioids.  Whether you were drinking alcohol or using other substances.  Your health care provider will do a physical exam. This exam may include:  Checking and monitoring your heart rate and rhythm, your breathing rate and depth, your temperature, and your blood pressure (vital signs).  Checking for abnormally small pupils.  Measuring oxygen levels in your blood.  You may also have blood tests or urine tests. How is this treated? Supporting your vital signs and your breathing is the first step in treating an opioid overdose. Treatment may also include:  Giving fluids and minerals (electrolytes) through an IV tube.  Inserting a breathing tube (endotracheal tube) in your airway to help you breathe.  Giving oxygen.  Passing a tube through your nose and into your stomach (NG tube, or nasogastric tube) to wash out your stomach.  Giving medicines that: ? Increase your   blood pressure. ? Absorb any opioid that is in your digestive system. ? Reverse the effects of the opioid (naloxone).  Ongoing counseling and mental health support if you intentionally overdosed or used an illegal drug.  Follow these instructions at home:  Take over-the-counter and prescription  medicines only as told by your health care provider. Always ask your health care provider about possible side effects and interactions of any new medicine that you start taking.  Keep a list of all of the medicines that you take, including over-the-counter medicines. Bring this list with you to all of your medical visits.  Drink enough fluid to keep your urine clear or pale yellow.  Keep all follow-up visits as told by your health care provider. This is important. How is this prevented?  Get help if you are struggling with: ? Alcohol or drug use. ? Depression or another mental health problem.  Keep the phone number of your local poison control center near your phone or on your cell phone.  Store all medicines in safety containers that are out of the reach of children.  Read the drug inserts that come with your medicines.  Do not drink alcohol when taking opioids.  Do not use illegal drugs.  Do not take opioid medicines that are not prescribed for you. Contact a health care provider if:  Your symptoms return.  You develop new symptoms or side effects when you are taking medicines. Get help right away if:  You think that you or someone else may have taken too much of an opioid. The hotline of the National Poison Control Center is (800) 222-1222.  You or someone else is having symptoms of an opioid overdose.  You have serious thoughts about hurting yourself or others.  You have: ? Chest pain. ? Difficulty breathing. ? A loss of consciousness. Opioid overdose is an emergency. Do not wait to see if the symptoms will go away. Get medical help right away. Call your local emergency services (911 in the [place] Do not drive yourself to the hospital. This information is not intended to replace advice given to you by your health care provider. Make sure you discuss any questions you have with your health care provider. Document Released: 06/09/2004 Document Revised: 10/08/2015 Document  Reviewed: 10/16/2014 Elsevier Interactive Patient Education  2018 [employer]  

## 2018-04-05 NOTE — Progress Notes (Signed)
Safety precautions to be maintained throughout the outpatient stay will include: orient to surroundings, keep bed in low position, maintain call bell within reach at all times, provide assistance with transfer out of bed and ambulation.  

## 2018-04-06 ENCOUNTER — Telehealth: Payer: Self-pay

## 2018-04-06 NOTE — Telephone Encounter (Signed)
Post procedure phone call.  Patient states that she is doing well.

## 2018-04-09 DIAGNOSIS — Z90710 Acquired absence of both cervix and uterus: Secondary | ICD-10-CM | POA: Insufficient documentation

## 2018-04-09 DIAGNOSIS — R232 Flushing: Secondary | ICD-10-CM | POA: Insufficient documentation

## 2018-04-09 DIAGNOSIS — F1190 Opioid use, unspecified, uncomplicated: Secondary | ICD-10-CM | POA: Insufficient documentation

## 2018-04-09 DIAGNOSIS — N952 Postmenopausal atrophic vaginitis: Secondary | ICD-10-CM | POA: Insufficient documentation

## 2018-04-09 MED FILL — Medication: INTRATHECAL | Qty: 1 | Status: AC

## 2018-04-24 IMAGING — CR DG LUMBAR SPINE COMPLETE W/ BEND
1 series · 6 of 6 positions shown · non-contrast
Comparison: 10/29/2015

CLINICAL DATA: Chronic low back pain.  Left lower extremity pain.

EXAM:
LUMBAR SPINE - COMPLETE WITH BENDING VIEWS

[Series 1: dg lumbar spine complete w/bend 6+v · 0.14mm/px · 6 of 6 slices shown]
[im 1/6]
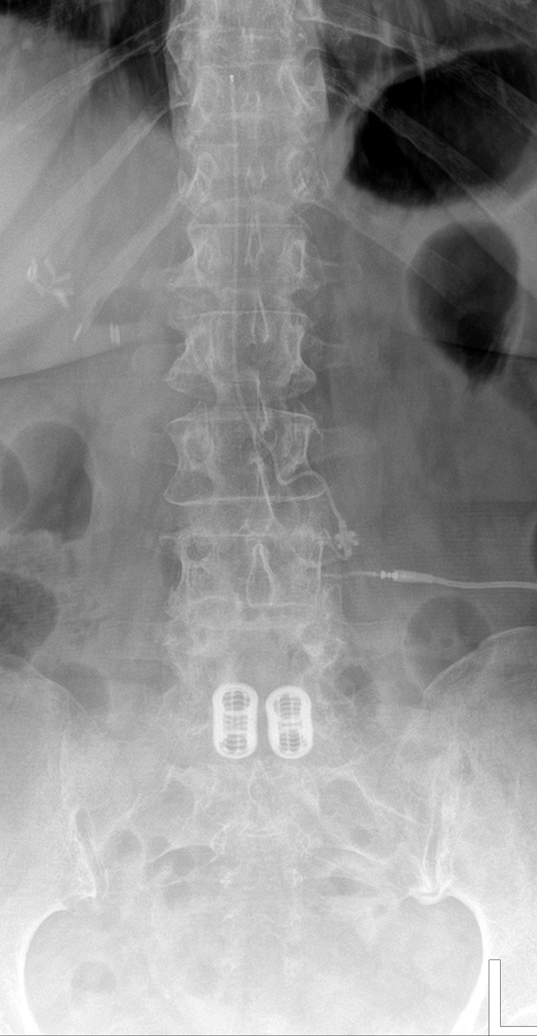
[im 2/6]
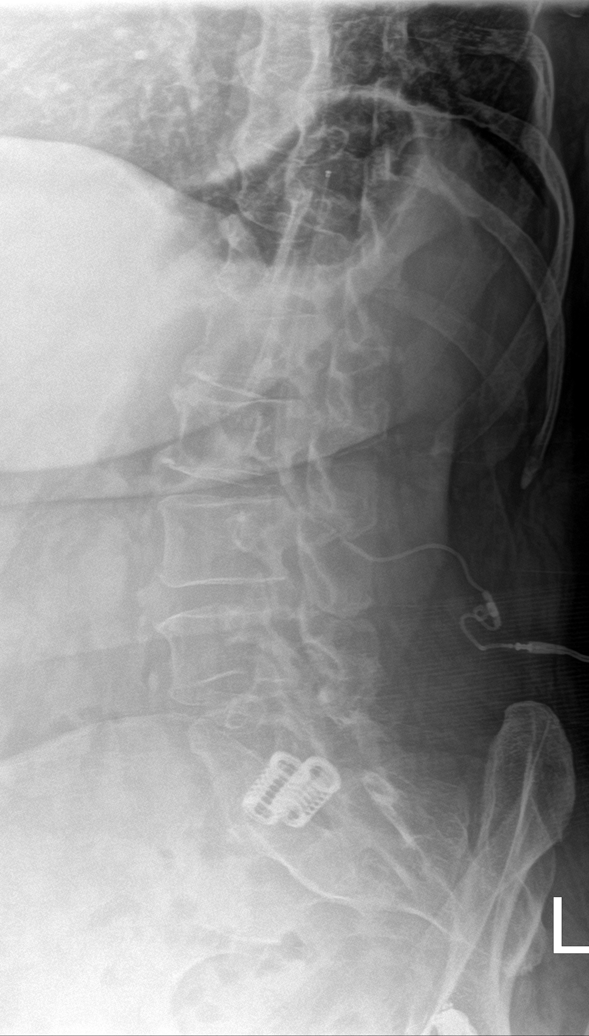
[im 3/6]
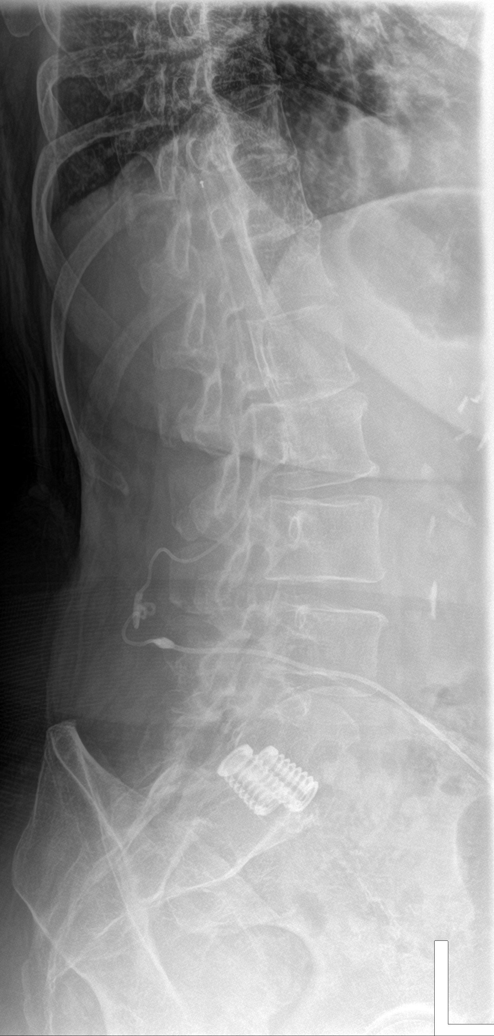
[im 4/6]
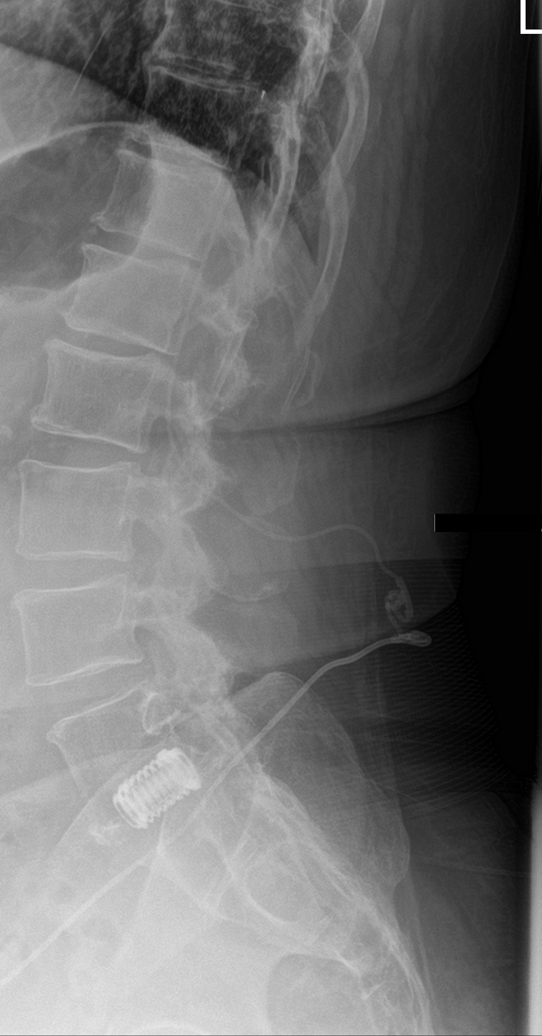
[im 5/6]
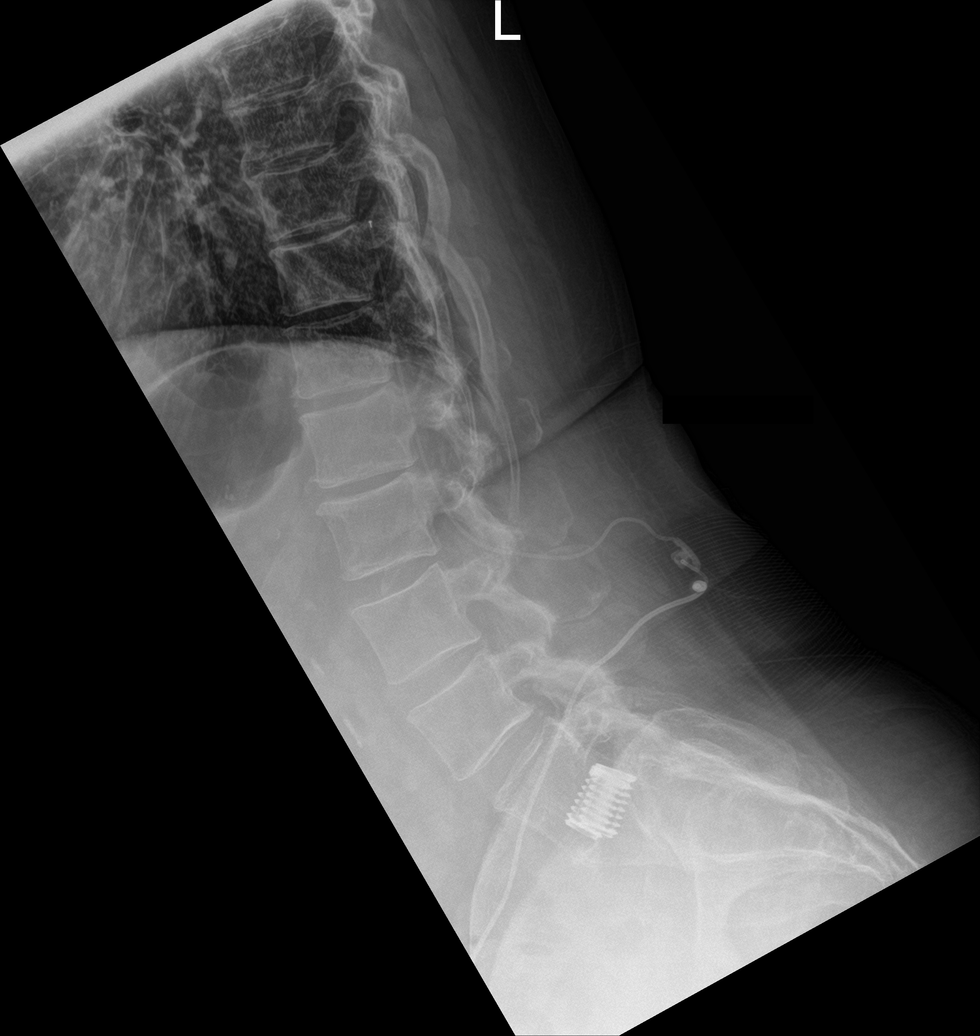
[im 6/6]
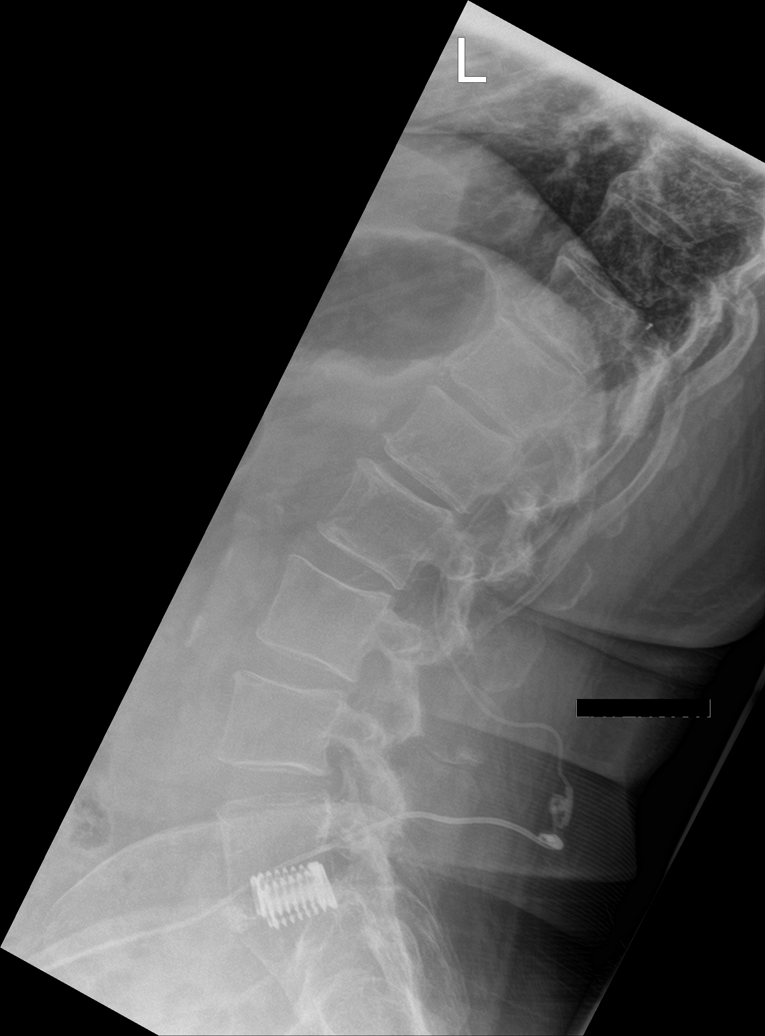

[6 of 6 positions shown; findings below may reference images not displayed]

FINDINGS: There are 5 non rib-bearing lumbar type vertebrae. Vertebral
alignment is normal. There is no evidence of dynamic instability on
flexion or extension images. Sequelae of L5-S1 Ray cage fusion are
again noted. Mild disc space narrowing at L1-2 is similar to the
prior study. Mild endplate spurring is present at L1-2 and in the
lower thoracic spine. No pars defects are identified. An intrathecal
catheter terminates just right of midline in the ventral canal at
the T11 superior endplate level. Right upper quadrant abdominal
surgical clips are noted.
IMPRESSION: 1. Similar appearance of mild disc degeneration at L1-2.
2. Unchanged L5-S1 fusion.

## 2018-05-10 ENCOUNTER — Other Ambulatory Visit: Payer: Self-pay

## 2018-05-10 MED ORDER — PAIN MANAGEMENT IT PUMP REFILL: 1.0000 | Freq: Once | INTRATHECAL | 0 refills | Status: DC

## 2018-06-05 ENCOUNTER — Other Ambulatory Visit: Payer: Self-pay

## 2018-06-05 ENCOUNTER — Ambulatory Visit: Payer: Medicare Other | Attending: Pain Medicine | Admitting: Pain Medicine

## 2018-06-05 ENCOUNTER — Encounter: Payer: Self-pay | Admitting: Pain Medicine

## 2018-06-05 VITALS — BP 125/104 | HR 76 | Temp 97.5°F | Ht 62.0 in | Wt 185.0 lb

## 2018-06-05 DIAGNOSIS — Z978 Presence of other specified devices: Secondary | ICD-10-CM | POA: Diagnosis present

## 2018-06-05 DIAGNOSIS — M47816 Spondylosis without myelopathy or radiculopathy, lumbar region: Secondary | ICD-10-CM

## 2018-06-05 DIAGNOSIS — M5416 Radiculopathy, lumbar region: Secondary | ICD-10-CM | POA: Insufficient documentation

## 2018-06-05 DIAGNOSIS — G894 Chronic pain syndrome: Secondary | ICD-10-CM

## 2018-06-05 DIAGNOSIS — K5903 Drug induced constipation: Secondary | ICD-10-CM | POA: Insufficient documentation

## 2018-06-05 DIAGNOSIS — Z451 Encounter for adjustment and management of infusion pump: Secondary | ICD-10-CM | POA: Diagnosis present

## 2018-06-05 DIAGNOSIS — M5442 Lumbago with sciatica, left side: Secondary | ICD-10-CM | POA: Diagnosis present

## 2018-06-05 DIAGNOSIS — G8929 Other chronic pain: Secondary | ICD-10-CM | POA: Diagnosis present

## 2018-06-05 DIAGNOSIS — T402X5A Adverse effect of other opioids, initial encounter: Secondary | ICD-10-CM

## 2018-06-05 DIAGNOSIS — M79605 Pain in left leg: Secondary | ICD-10-CM | POA: Insufficient documentation

## 2018-06-05 DIAGNOSIS — M961 Postlaminectomy syndrome, not elsewhere classified: Secondary | ICD-10-CM | POA: Diagnosis present

## 2018-06-05 MED ORDER — BENEFIBER PO POWD: 6.0000 g | Freq: Three times a day (TID) | ORAL | 2 refills | Status: DC

## 2018-06-05 MED ORDER — NALOXEGOL OXALATE 25 MG PO TABS: 25.0000 mg | ORAL_TABLET | Freq: Every day | ORAL | 2 refills | Status: DC

## 2018-06-05 NOTE — Patient Instructions (Signed)
A prescription for Movantik was sent to your pharmacy. Continue to use OTC Benefiber.

## 2018-06-05 NOTE — Progress Notes (Signed)
Patient's Name: Tiffany Hunter  MRN: [id number]  Referring Provider: Iva Lento, PA-C  DOB: 1945-07-28  PCP: Iva Lento, PA-C  DOS: 06/05/2018  Note by: Gaspar Cola, MD  Service setting: Ambulatory outpatient  Specialty: Interventional Pain Management  Patient type: Established  Location: [facility] (AMB) Pain Management Facility  Visit type: Interventional Procedure   Primary Reason for Visit: Interventional Pain Management Treatment. CC: Back Pain  Procedure:          Intrathecal Drug Delivery System (IDDS):  Type: Reservoir Refill [phone]) No rate change Region: Abdominal Laterality: Left  Type of Pump: Medtronic Synchromed II (MRI-compatible) Delivery Route: Intrathecal Type of Pain Treated: Neuropathic/Nociceptive Primary Medication Class: Opioid/opiate  Medication, Concentration, Infusion Program, & Delivery Rate: Please see scanned programming printout.   Indications: 1. 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 Chronic low back pain (Primary Area of Pain) (Left)   4. Lumbar facet joint syndrome (Left)   5. Chronic lower extremity pain (Secondary Area of pain) (Left)   6. 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 Encounter for adjustment or management of infusion pump    Pain Assessment: Self-Reported Pain Score: 0-No pain/10             Reported level is compatible with observation.        The patient was recently admitted to ED secondary to severe constipation, presumably from opioids.  We had previously offer the patient some medication for the constipation, but she turned it down indicating that she has pain taking over-the-counter medicines.  At this point, she realizes that this is not such a good idea had a had not been working well.  In view of this, we have started the patient on Benefiber and Movantik.  This is the medication  covered by her insurance company.  Intrathecal Pump Therapy Assessment  Manufacturer: Medtronic Synchromed II Type: Programmable Volume: 40 mL reservoir MRI compatibility: Yes   Drug content:  Primary Medication Class: Opioid Primary Medication:PF-Hydromorphone (Dilaudid)(73m/mL) Secondary Medication:PF-Sufentanil(70 g/mL) Other Medication: see pump readout   Programming:  Type: Simple continuous. See pump readout for details.   Changes:  Medication Change: None at this point Rate Change: No change in rate  Reported side-effects or adverse reactions: None reported  Effectiveness: Described as relatively effective, allowing for increase in activities of daily living (ADL) Clinically meaningful improvement in function (CMIF): Sustained CMIF goals met  Plan: Pump refill today  Pre-op Assessment:  Tiffany Hunter a 73y.o. (year old), female patient, seen today for interventional treatment. She  has a past surgical history that includes Carpal tunnel release (Bilateral); Back surgery; Breast surgery (Bilateral); Cholecystectomy; Colonoscopy w/ polypectomy; Cataract extraction w/ intraocular lens implant (Right); Abdominal hysterectomy; Fracture surgery (Right); Cardiac catheterization (2012); and Pain pump revision (Left, 07/07/2017). [name] a current medication list which includes the following prescription(s): aspirin ec, atorvastatin, carvedilol, cholecalciferol, clotrimazole-betamethasone, hydrocortisone, oxybutynin, albuterol, naloxegol oxalate, PAIN MANAGEMENT IT PUMP REFILL, PAIN MANAGEMENT IT PUMP REFILL, and benefiber. Her primarily concern today is the Back Pain  Initial Vital Signs:  Pulse/HCG Rate: 76  Temp: (!) 97.5 F (36.4 C) Resp:   BP: (!) 125/104 SpO2: 96 %  BMI: Estimated body mass index is 33.84 kg/m as calculated from the following:   Height as of this encounter: _0  (1.575 m).   Weight as of this encounter: 185 lb (  83.9 kg).  Risk  Assessment: Allergies: Reviewed. She is allergic to doxycycline hyclate and sulfa antibiotics.  Allergy Precautions: None required Coagulopathies: Reviewed. None identified.  Blood-thinner therapy: None at this time Active Infection(s): Reviewed. None identified. [name] is afebrile  Site Confirmation: Tiffany Hunter was asked to confirm the procedure and laterality before marking the site Procedure checklist: Completed Consent: Before the procedure and under the influence of no sedative(s), amnesic(s), or anxiolytics, the patient was informed of the treatment options, risks and possible complications. To fulfill our ethical and legal obligations, as recommended by the American Medical Association's Code of Ethics, I have informed the patient of my clinical impression; the nature and purpose of the treatment or procedure; the risks, benefits, and possible complications of the intervention; the alternatives, including doing nothing; the risk(s) and benefit(s) of the alternative treatment(s) or procedure(s); and the risk(s) and benefit(s) of doing nothing.  Tiffany Hunter was provided with information about the general risks and possible complications associated with most interventional procedures. These include, but are not limited to: failure to achieve desired goals, infection, bleeding, organ or nerve damage, allergic reactions, paralysis, and/or death.  In addition, she was informed of those risks and possible complications associated to this particular procedure, which include, but are not limited to: damage to the implant; failure to decrease pain; local, systemic, or serious CNS infections, intraspinal abscess with possible cord compression and paralysis, or life-threatening such as meningitis; bleeding; organ damage; nerve injury or damage with subsequent sensory, motor, and/or autonomic system dysfunction, resulting in transient or permanent pain, numbness, and/or weakness of one or several  areas of the body; allergic reactions, either minor or major life-threatening, such as anaphylactic or anaphylactoid reactions.  Furthermore, Tiffany Hunter was informed of those risks and complications associated with the medications. These include, but are not limited to: allergic reactions (i.e.: anaphylactic or anaphylactoid reactions); endorphine suppression; bradycardia and/or hypotension; water retention and/or peripheral vascular relaxation leading to lower extremity edema and possible stasis ulcers; respiratory depression and/or shortness of breath; decreased metabolic rate leading to weight gain; swelling or edema; medication-induced neural toxicity; particulate matter embolism and blood vessel occlusion with resultant organ, and/or nervous system infarction; and/or intrathecal granuloma formation with possible spinal cord compression and permanent paralysis.  Before refilling the pump Ms. Vater was informed that some of the medications used in the devise may not be FDA approved for such use and therefore it constitutes an off-label use of the medications.  Finally, she was informed that Medicine is not an exact science; therefore, there is also the possibility of unforeseen or unpredictable risks and/or possible complications that may result in a catastrophic outcome. The patient indicated having understood very clearly. We have given the patient no guarantees and we have made no promises. Enough time was given to the patient to ask questions, all of which were answered to the patient's satisfaction. Ms. Chaudhary has indicated that she wanted to continue with the procedure. Attestation: I, the ordering provider, attest that I have discussed with the patient the benefits, risks, side-effects, alternatives, likelihood of achieving goals, and potential problems during recovery for the procedure that I have provided informed consent. Date  Time: 06/05/2018 10:46 AM  Pre-Procedure Preparation:   Monitoring: As per clinic protocol. Respiration, ETCO2, SpO2, BP, heart rate and rhythm monitor placed and checked for adequate function Safety Precautions: Patient was assessed for positional comfort and pressure points before starting the procedure. Time-out: I initiated and conducted the "Time-out" before starting the  procedure, as per protocol. The patient was asked to participate by confirming the accuracy of the "Time Out" information. Verification of the correct person, site, and procedure were performed and confirmed by me, the nursing staff, and the patient. "Time-out" conducted as per Joint Commission's Universal Protocol (UP.01.01.01). Time: 1058  Description of Procedure:          Position: Supine Target Area: Central-port of intrathecal pump. Approach: Anterior, 90 degree angle approach. Area Prepped: Entire Area around the pump implant. Prepping solution: ChloraPrep (2% chlorhexidine gluconate and 70% isopropyl alcohol) Safety Precautions: Aspiration looking for blood return was conducted prior to all injections. At no point did we inject any substances, as a needle was being advanced. No attempts were made at seeking any paresthesias. Safe injection practices and needle disposal techniques used. Medications properly checked for expiration dates. SDV (single dose vial) medications used. Description of the Procedure: Protocol guidelines were followed. Two nurses trained to do implant refills were present during the entire procedure. The refill medication was checked by both healthcare providers as well as the patient. The patient was included in the "Time-out" to verify the medication. The patient was placed in position. The pump was identified. The area was prepped in the usual manner. The sterile template was positioned over the pump, making sure the side-port location matched that of the pump. Both, the pump and the template were held for stability. The needle provided in the Medtronic  Kit was then introduced thru the center of the template and into the central port. The pump content was aspirated and discarded volume documented. The new medication was slowly infused into the pump, thru the filter, making sure to avoid overpressure of the device. The needle was then removed and the area cleansed, making sure to leave some of the prepping solution back to take advantage of its long term bactericidal properties. The pump was interrogated and programmed to reflect the correct medication, volume, and dosage. The program was printed and taken to the physician for approval. Once checked and signed by the physician, a copy was provided to the patient and another scanned into the EMR. Vitals:   06/05/18 1040  BP: (!) 125/104  Pulse: 76  Temp: (!) 97.5 F (36.4 C)  SpO2: 96%  Weight: 185 lb (83.9 kg)  Height: _0  (1.575 m)    Start Time: 1100 hrs. End Time: 1118 hrs. Materials & Medications: Medtronic Refill Kit Medication(s): Please see chart orders for details.  Imaging Guidance:          Type of Imaging Technique: None used Indication(s): N/A Exposure Time: No patient exposure Contrast: None used. Fluoroscopic Guidance: N/A Ultrasound Guidance: N/A Interpretation: N/A  Antibiotic Prophylaxis:   Anti-infectives (From admission, onward)   None     Indication(s): None identified  Post-operative Assessment:  Post-procedure Vital Signs:  Pulse/HCG Rate: 76  Temp: (!) 97.5 F (36.4 C) Resp:   BP: (!) 125/104 SpO2: 96 %  EBL: None  Complications: No immediate post-treatment complications observed by team, or reported by patient.  Note: The patient tolerated the entire procedure well. A repeat set of vitals were taken after the procedure and the patient was kept under observation following institutional policy, for this type of procedure. Post-procedural neurological assessment was performed, showing return to baseline, prior to discharge. The patient was provided  with post-procedure discharge instructions, including a section on how to identify potential problems. Should any problems arise concerning this procedure, the patient was given instructions to immediately  contact us, at any time, without hesitation. In any case, we plan to contact the patient by telephone for a follow-up status report regarding this interventional procedure.  Comments:  No additional relevant information.  Plan of Care   Imaging Orders  No imaging studies ordered today    Procedure Orders     PUMP REFILL     PUMP REFILL  Medications ordered for procedure: Meds ordered this encounter  Medications  . Wheat Dextrin (BENEFIBER) POWD    Sig: Take 6 g by mouth 3 (three) times daily before meals. (2 tsp = 6 g)    Dispense:  730 g    Refill:  2  . naloxegol oxalate (MOVANTIK) 25 MG TABS tablet    Sig: Take 1 tablet (25 mg total) by mouth daily.    Dispense:  30 tablet    Refill:  2    Swallow whole on empty stomach, 1 hour before or 2 hours after a meal. Do not break or chew tablet.   Medications administered: [name] had no medications administered during this visit.  See the medical record for exact dosing, route, and time of administration.  Disposition: Discharge home  Discharge Date & Time: 06/05/2018; 1130 hrs.   Physician-requested Follow-up: Return for Pump Refill (as per pump program) (Max:8mo.  Future Appointments  Date Time Provider [place] 08/16/2018 11:00 AM [facility]ilinda Pointer [facility]   Primary Care Physician: WIva Lento PA-C [facility] Note by: FGaspar Cola MD Date: 06/05/2018; Time: 11:34 AM  Disclaimer:  Medicine is not an exact science. The only guarantee in medicine is that nothing is guaranteed. It is important to note that the decision to proceed with this intervention was based on the information collected from the patient. The Data and conclusions were drawn  from the patient's questionnaire, the interview, and the physical examination. Because the information was provided in large part by the patient, it cannot be guaranteed that it has not been purposely or unconsciously manipulated. Every effort has been made to obtain as much relevant data as possible for this evaluation. It is important to note that the conclusions that lead to this procedure are derived in large part from the available data. Always take into account that the treatment will also be dependent on availability of resources and existing treatment guidelines, considered by other Pain Management Practitioners as being common knowledge and practice, at the time of the intervention. For Medico-Legal purposes, it is also important to point out that variation in procedural techniques and pharmacological choices are the acceptable norm. The indications, contraindications, technique, and results of the above procedure should only be interpreted and judged by a Board-Certified Interventional Pain Specialist with extensive familiarity and expertise in the same exact procedure and technique.

## 2018-06-06 ENCOUNTER — Telehealth: Payer: Self-pay

## 2018-06-06 NOTE — Telephone Encounter (Signed)
Post pump refill phone call.  Patient states she is doing well.   

## 2018-06-13 MED FILL — Medication: INTRATHECAL | Qty: 1 | Status: AC

## 2018-08-02 ENCOUNTER — Other Ambulatory Visit: Payer: Self-pay

## 2018-08-02 MED ORDER — PAIN MANAGEMENT IT PUMP REFILL: 1.0000 | Freq: Once | INTRATHECAL | 0 refills | Status: DC

## 2018-08-16 ENCOUNTER — Other Ambulatory Visit: Payer: Self-pay

## 2018-08-16 ENCOUNTER — Ambulatory Visit: Payer: Medicare Other | Attending: Pain Medicine | Admitting: Pain Medicine

## 2018-08-16 ENCOUNTER — Encounter: Payer: Self-pay | Admitting: Pain Medicine

## 2018-08-16 VITALS — BP 132/72 | HR 74 | Temp 98.4°F | Resp 18 | Ht 62.0 in | Wt 180.0 lb

## 2018-08-16 DIAGNOSIS — M533 Sacrococcygeal disorders, not elsewhere classified: Secondary | ICD-10-CM | POA: Insufficient documentation

## 2018-08-16 DIAGNOSIS — G8929 Other chronic pain: Secondary | ICD-10-CM

## 2018-08-16 DIAGNOSIS — Z978 Presence of other specified devices: Secondary | ICD-10-CM | POA: Diagnosis present

## 2018-08-16 DIAGNOSIS — Z451 Encounter for adjustment and management of infusion pump: Secondary | ICD-10-CM

## 2018-08-16 DIAGNOSIS — Z79891 Long term (current) use of opiate analgesic: Secondary | ICD-10-CM

## 2018-08-16 DIAGNOSIS — M79605 Pain in left leg: Secondary | ICD-10-CM | POA: Diagnosis present

## 2018-08-16 DIAGNOSIS — G894 Chronic pain syndrome: Secondary | ICD-10-CM

## 2018-08-16 DIAGNOSIS — M47816 Spondylosis without myelopathy or radiculopathy, lumbar region: Secondary | ICD-10-CM

## 2018-08-16 DIAGNOSIS — M5442 Lumbago with sciatica, left side: Secondary | ICD-10-CM | POA: Diagnosis present

## 2018-08-16 DIAGNOSIS — M961 Postlaminectomy syndrome, not elsewhere classified: Secondary | ICD-10-CM | POA: Diagnosis present

## 2018-08-16 NOTE — Progress Notes (Signed)
Patient's Name: Tiffany Hunter  MRN: [id number]  Referring Provider: Iva Lento, PA-C  DOB: 1946/04/02  PCP: Iva Lento, PA-C  DOS: 08/16/2018  Note by: Gaspar Cola, MD  Service setting: Ambulatory outpatient  Specialty: Interventional Pain Management  Patient type: Established  Location: [facility] (AMB) Pain Management Facility  Visit type: Interventional Procedure   Primary Reason for Visit: Interventional Pain Management Treatment. CC: Back Pain  Procedure:          Intrathecal Drug Delivery System (IDDS):  Type: Reservoir Refill [phone]) No rate change Region: Abdominal Laterality: Left  Type of Pump: Medtronic Synchromed II Delivery Route: Intrathecal Type of Pain Treated: Neuropathic/Nociceptive Primary Medication Class: Opioid/opiate  Medication, Concentration, Infusion Program, & Delivery Rate: Please see scanned programming printout.   Indications: 1. 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 Chronic low back pain (Primary Area of Pain) (Left)   4. Chronic lower extremity pain (Secondary Area of pain) (Left)   5. Chronic sacroiliac joint pain [facility] source of pain) (Left)   6. Lumbar facet joint syndrome (Left)   7. 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 Encounter for adjustment or management of infusion pump   9. Long term current use of opiate analgesic    Pain Assessment: Self-Reported Pain Score: 2 /10             Reported level is compatible with observation.        Intrathecal Pump Therapy Assessment  Manufacturer: Medtronic Synchromed Type: Programmable Volume: 40 mL reservoir MRI compatibility: Yes   Drug content:  Primary Medication Class: Opioid Primary Medication:PF-Hydromorphone (Dilaudid)(22m/mL)  Secondary Medication:PF-Sufentanil(70 g/mL)  Other Medication: see pump readout   Programming:  Type: Simple continuous. See pump readout for details.    Changes:  Medication Change: None at this point Rate Change: No change in rate  Reported side-effects or adverse reactions: None reported  Effectiveness: Described as relatively effective, allowing for increase in activities of daily living (ADL) Clinically meaningful improvement in function (CMIF): Sustained CMIF goals met  Plan: Pump refill today  Pre-op Assessment:  Ms. MBolineis a 73y.o. (year old), female patient, seen today for interventional treatment. She  has a past surgical history that includes Carpal tunnel release (Bilateral); Back surgery; Breast surgery (Bilateral); Cholecystectomy; Colonoscopy w/ polypectomy; Cataract extraction w/ intraocular lens implant (Right); Abdominal hysterectomy; Fracture surgery (Right); Cardiac catheterization (2012); and Pain pump revision (Left, 07/07/2017). Ms. MArntzhas a current medication list which includes the following prescription(s): aspirin ec, atorvastatin, carvedilol, cholecalciferol, clotrimazole-betamethasone, hydrocortisone, mirabegron er, naloxegol oxalate, oxybutynin, benefiber, albuterol, PAIN MANAGEMENT IT PUMP REFILL, and PAIN MANAGEMENT IT PUMP REFILL. Her primarily concern today is the Back Pain  Initial Vital Signs:  Pulse/HCG Rate: 74  Temp: 98.4 F (36.9 C) Resp: 18 BP: 132/72 SpO2: 97 %  BMI: Estimated body mass index is 32.92 kg/m as calculated from the following:   Height as of this encounter: '5\' 2"'  (1.575 m).   Weight as of this encounter: 180 lb (81.6 kg).  Risk Assessment: Allergies: Reviewed. She is allergic to doxycycline hyclate and sulfa antibiotics.  Allergy Precautions: None required Coagulopathies: Reviewed. None identified.  Blood-thinner therapy: None at this time Active Infection(s): Reviewed. None identified. Ms. MDodgenis afebrile  Site Confirmation: Ms. MSieberswas asked to confirm the procedure and laterality before marking the site Procedure checklist: Completed Consent: Before  the procedure and under the influence of no sedative(s), amnesic(s),  or anxiolytics, the patient was informed of the treatment options, risks and possible complications. To fulfill our ethical and legal obligations, as recommended by the American Medical Association's Code of Ethics, I have informed the patient of my clinical impression; the nature and purpose of the treatment or procedure; the risks, benefits, and possible complications of the intervention; the alternatives, including doing nothing; the risk(s) and benefit(s) of the alternative treatment(s) or procedure(s); and the risk(s) and benefit(s) of doing nothing.  Ms. Costanza was provided with information about the general risks and possible complications associated with most interventional procedures. These include, but are not limited to: failure to achieve desired goals, infection, bleeding, organ or nerve damage, allergic reactions, paralysis, and/or death.  In addition, she was informed of those risks and possible complications associated to this particular procedure, which include, but are not limited to: damage to the implant; failure to decrease pain; local, systemic, or serious CNS infections, intraspinal abscess with possible cord compression and paralysis, or life-threatening such as meningitis; bleeding; organ damage; nerve injury or damage with subsequent sensory, motor, and/or autonomic system dysfunction, resulting in transient or permanent pain, numbness, and/or weakness of one or several areas of the body; allergic reactions, either minor or major life-threatening, such as anaphylactic or anaphylactoid reactions.  Furthermore, Ms. Demetrius was informed of those risks and complications associated with the medications. These include, but are not limited to: allergic reactions (i.e.: anaphylactic or anaphylactoid reactions); endorphine suppression; bradycardia and/or hypotension; water retention and/or peripheral vascular relaxation  leading to lower extremity edema and possible stasis ulcers; respiratory depression and/or shortness of breath; decreased metabolic rate leading to weight gain; swelling or edema; medication-induced neural toxicity; particulate matter embolism and blood vessel occlusion with resultant organ, and/or nervous system infarction; and/or intrathecal granuloma formation with possible spinal cord compression and permanent paralysis.  Before refilling the pump Ms. Stiehl was informed that some of the medications used in the devise may not be FDA approved for such use and therefore it constitutes an off-label use of the medications.  Finally, she was informed that Medicine is not an exact science; therefore, there is also the possibility of unforeseen or unpredictable risks and/or possible complications that may result in a catastrophic outcome. The patient indicated having understood very clearly. We have given the patient no guarantees and we have made no promises. Enough time was given to the patient to ask questions, all of which were answered to the patient's satisfaction. Ms. Whittinghill has indicated that she wanted to continue with the procedure. Attestation: I, the ordering provider, attest that I have discussed with the patient the benefits, risks, side-effects, alternatives, likelihood of achieving goals, and potential problems during recovery for the procedure that I have provided informed consent. Date  Time: 08/16/2018 10:33 AM  Pre-Procedure Preparation:  Monitoring: As per clinic protocol. Respiration, ETCO2, SpO2, BP, heart rate and rhythm monitor placed and checked for adequate function Safety Precautions: Patient was assessed for positional comfort and pressure points before starting the procedure. Time-out: I initiated and conducted the "Time-out" before starting the procedure, as per protocol. The patient was asked to participate by confirming the accuracy of the "Time Out" information.  Verification of the correct person, site, and procedure were performed and confirmed by me, the nursing staff, and the patient. "Time-out" conducted as per Joint Commission's Universal Protocol (UP.01.01.01). Time: 1045  Description of Procedure:          Position: Supine Target Area: Central-port of intrathecal pump. Approach: Anterior, 90  degree angle approach. Area Prepped: Entire Area around the pump implant. Prepping solution: ChloraPrep (2% chlorhexidine gluconate and 70% isopropyl alcohol) Safety Precautions: Aspiration looking for blood return was conducted prior to all injections. At no point did we inject any substances, as a needle was being advanced. No attempts were made at seeking any paresthesias. Safe injection practices and needle disposal techniques used. Medications properly checked for expiration dates. SDV (single dose vial) medications used. Description of the Procedure: Protocol guidelines were followed. Two nurses trained to do implant refills were present during the entire procedure. The refill medication was checked by both healthcare providers as well as the patient. The patient was included in the "Time-out" to verify the medication. The patient was placed in position. The pump was identified. The area was prepped in the usual manner. The sterile template was positioned over the pump, making sure the side-port location matched that of the pump. Both, the pump and the template were held for stability. The needle provided in the Medtronic Kit was then introduced thru the center of the template and into the central port. The pump content was aspirated and discarded volume documented. The new medication was slowly infused into the pump, thru the filter, making sure to avoid overpressure of the device. The needle was then removed and the area cleansed, making sure to leave some of the prepping solution back to take advantage of its long term bactericidal properties. The pump was  interrogated and programmed to reflect the correct medication, volume, and dosage. The program was printed and taken to the physician for approval. Once checked and signed by the physician, a copy was provided to the patient and another scanned into the EMR. Vitals:   08/16/18 1032 08/16/18 1034  BP:  132/72  Pulse:  74  Resp:  18  Temp:  98.4 F (36.9 C)  SpO2:  97%  Weight: 180 lb (81.6 kg)   Height: '5\' 2"'  (1.575 m)     Start Time: 1045 hrs. End Time: 1058 hrs. Materials & Medications: Medtronic Refill Kit Medication(s): Please see chart orders for details.  Imaging Guidance:          Type of Imaging Technique: None used Indication(s): N/A Exposure Time: No patient exposure Contrast: None used. Fluoroscopic Guidance: N/A Ultrasound Guidance: N/A Interpretation: N/A  Antibiotic Prophylaxis:   Anti-infectives (From admission, onward)   None     Indication(s): None identified  Post-operative Assessment:  Post-procedure Vital Signs:  Pulse/HCG Rate: 74  Temp: 98.4 F (36.9 C) Resp: 18 BP: 132/72 SpO2: 97 %  EBL: None  Complications: No immediate post-treatment complications observed by team, or reported by patient.  Note: The patient tolerated the entire procedure well. A repeat set of vitals were taken after the procedure and the patient was kept under observation following institutional policy, for this type of procedure. Post-procedural neurological assessment was performed, showing return to baseline, prior to discharge. The patient was provided with post-procedure discharge instructions, including a section on how to identify potential problems. Should any problems arise concerning this procedure, the patient was given instructions to immediately contact us, at any time, without hesitation. In any case, we plan to contact the patient by telephone for a follow-up status report regarding this interventional procedure.  Comments:  No additional relevant  information.  Plan of Care  Orders:  Orders Placed This Encounter  Procedures  . PUMP REFILL    Maintain Protocol by having two(2) healthcare providers during procedure and programming.  Scheduling Instructions:     Please refill intrathecal pump today.    Order Specific Question:   Where will this procedure be performed?    Answer:   [facility]  . PUMP REFILL    Whenever possible schedule on a procedure today.    Standing Status:   Future    Standing Expiration Date:   01/13/2019    Scheduling Instructions:     Please schedule intrathecal pump refill based on pump programming. Avoid schedule intervals of more than 120 days (4 months).    Order Specific Question:   Where will this procedure be performed?    Answer:   [facility]  . Informed Consent Details: Transcribe to consent form and obtain patient signature    Consent Attestation: I, the ordering provider, attest that I have discussed with the patient the benefits, risks, side-effects, alternatives, likelihood of achieving goals, and potential problems during recovery for the procedure that I have provided informed consent.    Scheduling Instructions:     Procedure: Intrathecal Pump Refill     Attending Physician: Beatriz Chancellor A. Dossie Arbour, MD     Indications: Chronic Pain Syndrome (G89.4)   Medications ordered for procedure: No orders of the defined types were placed in this encounter.  Medications administered: [name] had no medications administered during this visit.  See the medical record for exact dosing, route, and time of administration.   Disposition: Discharge home  Discharge Date & Time: 08/16/2018; 1100 hrs.   Follow-up plan:   Return for Pump Refill (as per pump program) (Max:88mo.     Future Appointments  Date Time Provider DLely 10/24/2018  1:00 PM [facility]il[facility]   Primary Care Physician: WIva Lento PA-C [facility] Note by: FGaspar Cola MD Date: 08/16/2018; Time: 11:26 AM  Disclaimer:  Medicine is not an exact science. The only guarantee in medicine is that nothing is guaranteed. It is important to note that the decision to proceed with this intervention was based on the information collected from the patient. The Data and conclusions were drawn from the patient's questionnaire, the interview, and the physical examination. Because the information was provided in large part by the patient, it cannot be guaranteed that it has not been purposely or unconsciously manipulated. Every effort has been made to obtain as much relevant data as possible for this evaluation. It is important to note that the conclusions that lead to this procedure are derived in large part from the available data. Always take into account that the treatment will also be dependent on availability of resources and existing treatment guidelines, considered by other Pain Management Practitioners as being common knowledge and practice, at the time of the intervention. For Medico-Legal purposes, it is also important to point out that variation in procedural techniques and pharmacological choices are the acceptable norm. The indications, contraindications, technique, and results of the above procedure should only be interpreted and judged by a Board-Certified Interventional Pain Specialist with extensive familiarity and expertise in the same exact procedure and technique.

## 2018-08-16 NOTE — Progress Notes (Signed)
Safety precautions to be maintained throughout the outpatient stay will include: orient to surroundings, keep bed in low position, maintain call bell within reach at all times, provide assistance with transfer out of bed and ambulation.  

## 2018-08-17 ENCOUNTER — Telehealth: Payer: Self-pay

## 2018-10-03 ENCOUNTER — Other Ambulatory Visit: Payer: Self-pay

## 2018-10-03 MED ORDER — PAIN MANAGEMENT IT PUMP REFILL: 1.0000 | Freq: Once | INTRATHECAL | 0 refills | Status: DC

## 2018-10-15 ENCOUNTER — Other Ambulatory Visit: Payer: Self-pay

## 2018-10-15 ENCOUNTER — Telehealth: Payer: Self-pay

## 2018-10-15 ENCOUNTER — Telehealth: Payer: Self-pay | Admitting: Pain Medicine

## 2018-10-15 DIAGNOSIS — Z01818 Encounter for other preprocedural examination: Secondary | ICD-10-CM | POA: Insufficient documentation

## 2018-10-15 NOTE — Telephone Encounter (Signed)
Patient called and notified that she needed to come for a covid 19 test at a [facility] facility on 09-18-18 for her pump refill on 09-23-2018.  Notified patient to be quaranteed until her appointment.  Patient states understanding.

## 2018-10-19 ENCOUNTER — Other Ambulatory Visit: Payer: Self-pay

## 2018-10-19 ENCOUNTER — Other Ambulatory Visit
Admission: RE | Admit: 2018-10-19 | Discharge: 2018-10-19 | Disposition: A | Discharge status: Home / Self Care | Payer: Medicare Other | Source: Ambulatory Visit | Attending: Pain Medicine | Admitting: Pain Medicine

## 2018-10-19 DIAGNOSIS — Z1159 Encounter for screening for other viral diseases: Secondary | ICD-10-CM | POA: Insufficient documentation

## 2018-10-19 DIAGNOSIS — Z01812 Encounter for preprocedural laboratory examination: Secondary | ICD-10-CM | POA: Diagnosis present

## 2018-10-20 LAB — NOVEL CORONAVIRUS, NAA (HOSP ORDER, SEND-OUT TO REF LAB; TAT 18-24 HRS): SARS-CoV-2, NAA: NOT DETECTED

## 2018-10-24 ENCOUNTER — Other Ambulatory Visit: Payer: Self-pay

## 2018-10-24 ENCOUNTER — Ambulatory Visit: Payer: Medicare Other | Attending: Pain Medicine | Admitting: Pain Medicine

## 2018-10-24 ENCOUNTER — Encounter: Payer: Self-pay | Admitting: Pain Medicine

## 2018-10-24 VITALS — BP 148/77 | HR 69 | Temp 99.0°F | Resp 18 | Ht 62.0 in | Wt 180.0 lb

## 2018-10-24 DIAGNOSIS — G8929 Other chronic pain: Secondary | ICD-10-CM | POA: Diagnosis present

## 2018-10-24 DIAGNOSIS — T402X5A Adverse effect of other opioids, initial encounter: Secondary | ICD-10-CM

## 2018-10-24 DIAGNOSIS — M961 Postlaminectomy syndrome, not elsewhere classified: Secondary | ICD-10-CM

## 2018-10-24 DIAGNOSIS — Z978 Presence of other specified devices: Secondary | ICD-10-CM

## 2018-10-24 DIAGNOSIS — G894 Chronic pain syndrome: Secondary | ICD-10-CM | POA: Diagnosis not present

## 2018-10-24 DIAGNOSIS — M533 Sacrococcygeal disorders, not elsewhere classified: Secondary | ICD-10-CM | POA: Diagnosis present

## 2018-10-24 DIAGNOSIS — K5903 Drug induced constipation: Secondary | ICD-10-CM | POA: Insufficient documentation

## 2018-10-24 DIAGNOSIS — Z451 Encounter for adjustment and management of infusion pump: Secondary | ICD-10-CM

## 2018-10-24 DIAGNOSIS — M5442 Lumbago with sciatica, left side: Secondary | ICD-10-CM | POA: Diagnosis present

## 2018-10-24 DIAGNOSIS — M79605 Pain in left leg: Secondary | ICD-10-CM | POA: Diagnosis present

## 2018-10-24 MED ORDER — NALOXEGOL OXALATE 25 MG PO TABS: 25.0000 mg | ORAL_TABLET | Freq: Every day | ORAL | 5 refills | Status: DC

## 2018-10-24 MED ORDER — BENEFIBER PO POWD: 6.0000 g | Freq: Three times a day (TID) | ORAL | 5 refills | Status: DC

## 2018-10-24 NOTE — Progress Notes (Signed)
Safety precautions to be maintained throughout the outpatient stay will include: orient to surroundings, keep bed in low position, maintain call bell within reach at all times, provide assistance with transfer out of bed and ambulation.  

## 2018-10-24 NOTE — Progress Notes (Signed)
Patient's Name: Tiffany Hunter  MRN: [id number]  Referring Provider: Iva Lento, PA-C  DOB: 12/24/1945  PCP: Iva Lento, PA-C  DOS: 10/24/2018  Note by: Gaspar Cola, MD  Service setting: Ambulatory outpatient  Specialty: Interventional Pain Management  Patient type: Established  Location: [facility] (AMB) Pain Management Facility  Visit type: Interventional Procedure   Primary Reason for Visit: Interventional Pain Management Treatment. CC: Back Pain  Procedure:          Intrathecal Drug Delivery System (IDDS):  Type: Reservoir Refill [id number]) No rate change Region: Abdominal Laterality: Left  Type of Pump: Medtronic Synchromed II Delivery Route: Intrathecal Type of Pain Treated: Neuropathic/Nociceptive Primary Medication Class: Opioid/opiate  Medication, Concentration, Infusion Program, & Delivery Rate: Please see scanned programming printout.   Indications: 1. Chronic pain syndrome   2. Chronic low back pain (Primary Area of Pain) (Left)   3. Chronic lower extremity pain (Secondary Area of pain) (Left)   4. 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 Encounter for adjustment or management of infusion pump   8. Opioid-induced constipation (OIC)    Pain Assessment: Self-Reported Pain Score: 7 /10             Reported level is compatible with observation.        Note: Patient indicates that she has been experiencing a little bit more pain in the lower back in the afternoon since she also indicates feeling somewhat weak.  This has been a common and recurrent complaint with all of my patients through this COVID-19 pandemic and it seems to be associated with the fact that nobody is going out and exercising.  I have recommended that she do some walking around the house and perhaps go well up to a place where she can walk while  maintaining some physical distance from other people.  She initially asked me for an increase in the pump and today I took time to explain to her the issues associated with doing this.  To start with, I explained to the patient and the fact that when she first came in she had this combination of hydromorphone and some fentanyl running in the pump, which is something that we normally would not do ourselves.  However, because she had indicated that this was working for her we decided not to be make any changes.  Today I have explained to the patient that what we prefer doing is using only 1 opioid and then adding local anesthetic to the mixture so asked to get more analgesia in that way.  The problem with increasing her rate is that it would cause for the medication to run out given faster and then she would need to come in more often for her refills.  As is, she was last here on 08/16/2018 and she is already back to have the pump refill.  Given all of these options, the patient has decided to make no changes to the pump today but she has also indicated that she would like to think about her options and perhaps let me know on the next visit.  Intrathecal Pump Therapy Assessment  Manufacturer: Medtronic Synchromed Type: Programmable Volume: 40 mL reservoir MRI compatibility: Yes   Drug content:  Primary Medication Class:Opioid Primary Medication:PF-Hydromorphone (Dilaudid)(73m/mL)  Secondary Medication:PF-Sufentanil(70 g/mL)  Other Medication:see pump readout   Programming:  Type: Simple continuous.  See pump readout for details.   Changes:  Medication Change: None at this point Rate Change: No change in rate  Reported side-effects or adverse reactions: None reported  Effectiveness: Described as relatively effective, allowing for increase in activities of daily living (ADL) Clinically meaningful improvement in function (CMIF): Sustained CMIF goals met  Plan: Pump refill today  Pre-op  Assessment:  [name] is a 73 y.o. (year old), female patient, seen today for interventional treatment. She  has a past surgical history that includes Carpal tunnel release (Bilateral); Back surgery; Breast surgery (Bilateral); Cholecystectomy; Colonoscopy w/ polypectomy; Cataract extraction w/ intraocular lens implant (Right); Abdominal hysterectomy; Fracture surgery (Right); Cardiac catheterization (2012); and Pain pump revision (Left, 07/07/2017). Ms. Hattabaugh has a current medication list which includes the following prescription(s): albuterol, aspirin ec, atorvastatin, carvedilol, cholecalciferol, clotrimazole-betamethasone, hydrocortisone, mirabegron er, naloxegol oxalate, oxybutynin, benefiber, and PAIN MANAGEMENT IT PUMP REFILL. Her primarily concern today is the Back Pain  Initial Vital Signs:  Pulse/HCG Rate: 69  Temp: 99 F (37.2 C) Resp: 18 BP: (!) 148/77 SpO2: 95 %  BMI: Estimated body mass index is 32.92 kg/m as calculated from the following:   Height as of this encounter: _0  (1.575 m).   Weight as of this encounter: 180 lb (81.6 kg).  Risk Assessment: Allergies: Reviewed. She is allergic to doxycycline hyclate and sulfa antibiotics.  Allergy Precautions: None required Coagulopathies: Reviewed. None identified.  Blood-thinner therapy: None at this time Active Infection(s): Reviewed. None identified. Ms. Stantz is afebrile  Site Confirmation: Ms. Palecek was asked to confirm the procedure and laterality before marking the site Procedure checklist: Completed Consent: Before the procedure and under the influence of no sedative(s), amnesic(s), or anxiolytics, the patient was informed of the treatment options, risks and possible complications. To fulfill our ethical and legal obligations, as recommended by the American Medical Association's Code of Ethics, I have informed the patient of my clinical impression; the nature and purpose of the treatment or procedure; the risks,  benefits, and possible complications of the intervention; the alternatives, including doing nothing; the risk(s) and benefit(s) of the alternative treatment(s) or procedure(s); and the risk(s) and benefit(s) of doing nothing.  Ms. Batson was provided with information about the general risks and possible complications associated with most interventional procedures. These include, but are not limited to: failure to achieve desired goals, infection, bleeding, organ or nerve damage, allergic reactions, paralysis, and/or death.  In addition, she was informed of those risks and possible complications associated to this particular procedure, which include, but are not limited to: damage to the implant; failure to decrease pain; local, systemic, or serious CNS infections, intraspinal abscess with possible cord compression and paralysis, or life-threatening such as meningitis; bleeding; organ damage; nerve injury or damage with subsequent sensory, motor, and/or autonomic system dysfunction, resulting in transient or permanent pain, numbness, and/or weakness of one or several areas of the body; allergic reactions, either minor or major life-threatening, such as anaphylactic or anaphylactoid reactions.  Furthermore, Ms. Talerico was informed of those risks and complications associated with the medications. These include, but are not limited to: allergic reactions (i.e.: anaphylactic or anaphylactoid reactions); endorphine suppression; bradycardia and/or hypotension; water retention and/or peripheral vascular relaxation leading to lower extremity edema and possible stasis ulcers; respiratory depression and/or shortness of breath; decreased metabolic rate leading to weight gain; swelling or edema; medication-induced neural toxicity; particulate matter embolism and blood vessel occlusion with resultant organ, and/or nervous system infarction; and/or intrathecal granuloma formation with possible spinal  cord compression and  permanent paralysis.  Before refilling the pump Ms. Rogus was informed that some of the medications used in the devise may not be FDA approved for such use and therefore it constitutes an off-label use of the medications.  Finally, she was informed that Medicine is not an exact science; therefore, there is also the possibility of unforeseen or unpredictable risks and/or possible complications that may result in a catastrophic outcome. The patient indicated having understood very clearly. We have given the patient no guarantees and we have made no promises. Enough time was given to the patient to ask questions, all of which were answered to the patient's satisfaction. Ms. Ober has indicated that she wanted to continue with the procedure. Attestation: I, the ordering provider, attest that I have discussed with the patient the benefits, risks, side-effects, alternatives, likelihood of achieving goals, and potential problems during recovery for the procedure that I have provided informed consent. Date  Time: 10/24/2018  1:06 PM  Pre-Procedure Preparation:  Monitoring: As per clinic protocol. Respiration, ETCO2, SpO2, BP, heart rate and rhythm monitor placed and checked for adequate function Safety Precautions: Patient was assessed for positional comfort and pressure points before starting the procedure. Time-out: I initiated and conducted the "Time-out" before starting the procedure, as per protocol. The patient was asked to participate by confirming the accuracy of the "Time Out" information. Verification of the correct person, site, and procedure were performed and confirmed by me, the nursing staff, and the patient. "Time-out" conducted as per Joint Commission's Universal Protocol (UP.01.01.01). Time: 1312  Description of Procedure:          Position: Supine Target Area: Central-port of intrathecal pump. Approach: Anterior, 90 degree angle approach. Area Prepped: Entire Area around the pump  implant. Prepping solution: DuraPrep (Iodine Povacrylex [0.7% available iodine] and Isopropyl Alcohol, 74% w/w) Safety Precautions: Aspiration looking for blood return was conducted prior to all injections. At no point did we inject any substances, as a needle was being advanced. No attempts were made at seeking any paresthesias. Safe injection practices and needle disposal techniques used. Medications properly checked for expiration dates. SDV (single dose vial) medications used. Description of the Procedure: Protocol guidelines were followed. Two nurses trained to do implant refills were present during the entire procedure. The refill medication was checked by both healthcare providers as well as the patient. The patient was included in the "Time-out" to verify the medication. The patient was placed in position. The pump was identified. The area was prepped in the usual manner. The sterile template was positioned over the pump, making sure the side-port location matched that of the pump. Both, the pump and the template were held for stability. The needle provided in the Medtronic Kit was then introduced thru the center of the template and into the central port. The pump content was aspirated and discarded volume documented. The new medication was slowly infused into the pump, thru the filter, making sure to avoid overpressure of the device. The needle was then removed and the area cleansed, making sure to leave some of the prepping solution back to take advantage of its long term bactericidal properties. The pump was interrogated and programmed to reflect the correct medication, volume, and dosage. The program was printed and taken to the physician for approval. Once checked and signed by the physician, a copy was provided to the patient and another scanned into the EMR. Vitals:   10/24/18 1304 10/24/18 1307  BP:  (!) 148/77  Pulse: 69   Resp: 18   Temp: 99 F (37.2 C)   SpO2: 95%   Weight: 180 lb  (81.6 kg)   Height: _0  (1.575 m)     Start Time: 1315 hrs. End Time: 1340 hrs. Materials & Medications: Medtronic Refill Kit Medication(s): Please see chart orders for details.  Imaging Guidance:          Type of Imaging Technique: None used Indication(s): N/A Exposure Time: No patient exposure Contrast: None used. Fluoroscopic Guidance: N/A Ultrasound Guidance: N/A Interpretation: N/A  Antibiotic Prophylaxis:   Anti-infectives (From admission, onward)   None     Indication(s): None identified  Post-operative Assessment:  Post-procedure Vital Signs:  Pulse/HCG Rate: 69  Temp: 99 F (37.2 C) Resp: 18 BP: (!) 148/77 SpO2: 95 %  EBL: None  Complications: No immediate post-treatment complications observed by team, or reported by patient.  Note: The patient tolerated the entire procedure well. A repeat set of vitals were taken after the procedure and the patient was kept under observation following institutional policy, for this type of procedure. Post-procedural neurological assessment was performed, showing return to baseline, prior to discharge. The patient was provided with post-procedure discharge instructions, including a section on how to identify potential problems. Should any problems arise concerning this procedure, the patient was given instructions to immediately contact us, at any time, without hesitation. In any case, we plan to contact the patient by telephone for a follow-up status report regarding this interventional procedure.  Comments:  No additional relevant information.  Plan of Care  Orders:  Orders Placed This Encounter  Procedures  . PUMP REFILL    Maintain Protocol by having two(2) healthcare providers during procedure and programming.    Scheduling Instructions:     Please refill intrathecal pump today.    Order Specific Question:   Where will this procedure be performed?    Answer:   [facility]  . PUMP REFILL    Whenever  possible schedule on a procedure today.    Standing Status:   Future    Standing Expiration Date:   03/23/2019    Scheduling Instructions:     Please schedule intrathecal pump refill based on pump programming. Avoid schedule intervals of more than 120 days (4 months).    Order Specific Question:   Where will this procedure be performed?    Answer:   [facility]  . Informed Consent Details: Transcribe to consent form and obtain patient signature    Consent Attestation: I, the ordering provider, attest that I have discussed with the patient the benefits, risks, side-effects, alternatives, likelihood of achieving goals, and potential problems during recovery for the procedure that I have provided informed consent.    Scheduling Instructions:     Procedure: Intrathecal Pump Refill     Attending Physician: Beatriz Chancellor A. Dossie Arbour, MD     Indications: Chronic Pain Syndrome (G89.4)   Medications ordered for procedure: Meds ordered this encounter  Medications  . naloxegol oxalate (MOVANTIK) 25 MG TABS tablet    Sig: Take 1 tablet (25 mg total) by mouth daily. Swallow whole on empty stomach, 1 hour before or 2 hours after a meal. Do not break or chew tablet.    Dispense:  30 tablet    Refill:  5    Fill one day early if pharmacy is closed on scheduled refill date. May substitute for generic if available.  . Wheat Dextrin (BENEFIBER) POWD    Sig: Take 6  g by mouth 3 (three) times daily before meals. (2 tsp = 6 g)    Dispense:  730 g    Refill:  5    Fill one day early if pharmacy is closed on scheduled refill date. May substitute for generic if available.   Medications administered: [name] had no medications administered during this visit.  See the medical record for exact dosing, route, and time of administration.  Disposition: Discharge home  Discharge Date & Time: 10/24/2018; 1341 hrs.   Follow-up plan:   Return for Pump Refill (Max:29mo.     Future Appointments  Date  Time Provider DNesbitt 01/03/2019 11:00 AM [facility]il[facility]   Primary Care Physician: WIva Lento PA-C [facility] Note by: FGaspar Cola MD Date: 10/24/2018; Time: 2:34 PM  Disclaimer:  Medicine is not an eChief Strategy Officer The only guarantee in medicine is that nothing is guaranteed. It is important to note that the decision to proceed with this intervention was based on the information collected from the patient. The Data and conclusions were drawn from the patient's questionnaire, the interview, and the physical examination. Because the information was provided in large part by the patient, it cannot be guaranteed that it has not been purposely or unconsciously manipulated. Every effort has been made to obtain as much relevant data as possible for this evaluation. It is important to note that the conclusions that lead to this procedure are derived in large part from the available data. Always take into account that the treatment will also be dependent on availability of resources and existing treatment guidelines, considered by other Pain Management Practitioners as being common knowledge and practice, at the time of the intervention. For Medico-Legal purposes, it is also important to point out that variation in procedural techniques and pharmacological choices are the acceptable norm. The indications, contraindications, technique, and results of the above procedure should only be interpreted and judged by a Board-Certified Interventional Pain Specialist with extensive familiarity and expertise in the same exact procedure and technique.

## 2018-10-24 NOTE — Patient Instructions (Signed)

## 2018-10-25 MED FILL — Medication: INTRATHECAL | Qty: 1 | Status: AC

## 2018-12-11 ENCOUNTER — Other Ambulatory Visit: Payer: Self-pay

## 2018-12-11 MED ORDER — PAIN MANAGEMENT IT PUMP REFILL: 1.0000 | Freq: Once | INTRATHECAL | 0 refills | Status: DC

## 2019-01-03 ENCOUNTER — Other Ambulatory Visit: Payer: Self-pay

## 2019-01-03 ENCOUNTER — Ambulatory Visit: Payer: Medicare Other | Attending: Pain Medicine | Admitting: Pain Medicine

## 2019-01-03 ENCOUNTER — Encounter: Payer: Self-pay | Admitting: Pain Medicine

## 2019-01-03 VITALS — BP 127/65 | HR 79 | Temp 98.5°F | Resp 18 | Ht 62.0 in | Wt 172.0 lb

## 2019-01-03 DIAGNOSIS — M79605 Pain in left leg: Secondary | ICD-10-CM | POA: Diagnosis present

## 2019-01-03 DIAGNOSIS — M961 Postlaminectomy syndrome, not elsewhere classified: Secondary | ICD-10-CM

## 2019-01-03 DIAGNOSIS — Z978 Presence of other specified devices: Secondary | ICD-10-CM

## 2019-01-03 DIAGNOSIS — M5442 Lumbago with sciatica, left side: Secondary | ICD-10-CM | POA: Diagnosis present

## 2019-01-03 DIAGNOSIS — G894 Chronic pain syndrome: Secondary | ICD-10-CM

## 2019-01-03 DIAGNOSIS — M533 Sacrococcygeal disorders, not elsewhere classified: Secondary | ICD-10-CM | POA: Diagnosis present

## 2019-01-03 DIAGNOSIS — Z451 Encounter for adjustment and management of infusion pump: Secondary | ICD-10-CM | POA: Diagnosis present

## 2019-01-03 DIAGNOSIS — G8929 Other chronic pain: Secondary | ICD-10-CM

## 2019-01-03 NOTE — Progress Notes (Signed)
Patient's Name: Tiffany Hunter  MRN: [id number]  Referring Provider: Iva Lento, PA-C  DOB: 1945/08/31  PCP: Iva Lento, PA-C  DOS: 01/03/2019  Note by: Gaspar Cola, MD  Service setting: Ambulatory outpatient  Specialty: Interventional Pain Management  Patient type: Established  Location: [facility] (AMB) Pain Management Facility  Visit type: Interventional Procedure   Primary Reason for Visit: Interventional Pain Management Treatment. CC: Back Pain (left, lower)  Procedure:          Intrathecal Drug Delivery System (IDDS):  Type: Reservoir Refill [phone]) No rate change Region: Abdominal Laterality: Left  Type of Pump: Medtronic Synchromed II Delivery Route: Intrathecal Type of Pain Treated: Neuropathic/Nociceptive Primary Medication Class: Opioid/opiate  Medication, Concentration, Infusion Program, & Delivery Rate: Please see scanned programming printout.   Indications: 1. 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 Chronic low back pain (Primary Area of Pain) (Left)   4. Chronic lower extremity pain (Secondary Area of pain) (Left)   5. Chronic sacroiliac joint pain [facility] source of pain) (Left)   6. 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 Encounter for adjustment or management of infusion pump    Pain Assessment: Self-Reported Pain Score: 8 /10             Reported level is compatible with observation.        Pharmacotherapy Assessment  Analgesic: No other oral opioid analgesics prescribed by our practice.   Monitoring: Pharmacotherapy: No side-effects or adverse reactions reported. [place] PMP: PDMP reviewed during this encounter.       Compliance: No problems identified. Effectiveness: Clinically acceptable. Plan: Refer to "POC".  UDS: No results found for: SUMMARY Intrathecal Pump Therapy Assessment  Manufacturer: Medtronic Synchromed Type: Programmable Volume: 40 mL reservoir MRI  compatibility: Yes   Drug content:  Primary Medication Class:Opioid Primary Medication:PF-Hydromorphone (Dilaudid)(33m/mL) Secondary Medication:PF-Sufentanil(70 g/mL) Other Medication: No third medication   Programming:  Type: Simple continuous. See pump readout for details.   Changes:  Medication Change: None at this point Rate Change: No change in rate  Reported side-effects or adverse reactions: None reported  Effectiveness: Described as relatively effective, allowing for increase in activities of daily living (ADL) Clinically meaningful improvement in function (CMIF): Sustained CMIF goals met  Plan: Pump refill today  Pre-op Assessment:  Tiffany Hunter a 73y.o. (year old), female patient, seen today for interventional treatment. She  has a past surgical history that includes Carpal tunnel release (Bilateral); Back surgery; Breast surgery (Bilateral); Cholecystectomy; Colonoscopy w/ polypectomy; Cataract extraction w/ intraocular lens implant (Right); Abdominal hysterectomy; Fracture surgery (Right); Cardiac catheterization (2012); and Pain pump revision (Left, 07/07/2017). Ms. MStroscheinhas a current medication list which includes the following prescription(s): aspirin ec, atorvastatin, carvedilol, cholecalciferol, clotrimazole-betamethasone, hydrocortisone, mirabegron er, naloxegol oxalate, oxybutynin, benefiber, albuterol, and PAIN MANAGEMENT IT PUMP REFILL. Her primarily concern today is the Back Pain (left, lower)  Initial Vital Signs:  Pulse/HCG Rate: 79  Temp: 98.5 F (36.9 C) Resp: 18 BP: 127/65 SpO2: 95 %  BMI: Estimated body mass index is 31.46 kg/m as calculated from the following:   Height as of this encounter: '5\' 2"'  (1.575 m).   Weight as of this encounter: 172 lb (78 kg).  Risk Assessment: Allergies: Reviewed. She is allergic to doxycycline hyclate and sulfa antibiotics.  Allergy Precautions: None required Coagulopathies: Reviewed. None identified.   Blood-thinner therapy: None at this time Active Infection(s): Reviewed. None identified. Ms. MDottsis afebrile  Site Confirmation: Tiffany Hunter was asked to confirm the procedure and laterality before marking the site Procedure checklist: Completed Consent: Before the procedure and under the influence of no sedative(s), amnesic(s), or anxiolytics, the patient was informed of the treatment options, risks and possible complications. To fulfill our ethical and legal obligations, as recommended by the American Medical Association's Code of Ethics, I have informed the patient of my clinical impression; the nature and purpose of the treatment or procedure; the risks, benefits, and possible complications of the intervention; the alternatives, including doing nothing; the risk(s) and benefit(s) of the alternative treatment(s) or procedure(s); and the risk(s) and benefit(s) of doing nothing.  Tiffany Hunter was provided with information about the general risks and possible complications associated with most interventional procedures. These include, but are not limited to: failure to achieve desired goals, infection, bleeding, organ or nerve damage, allergic reactions, paralysis, and/or death.  In addition, she was informed of those risks and possible complications associated to this particular procedure, which include, but are not limited to: damage to the implant; failure to decrease pain; local, systemic, or serious CNS infections, intraspinal abscess with possible cord compression and paralysis, or life-threatening such as meningitis; bleeding; organ damage; nerve injury or damage with subsequent sensory, motor, and/or autonomic system dysfunction, resulting in transient or permanent pain, numbness, and/or weakness of one or several areas of the body; allergic reactions, either minor or major life-threatening, such as anaphylactic or anaphylactoid reactions.  Furthermore, Tiffany Hunter was informed of those  risks and complications associated with the medications. These include, but are not limited to: allergic reactions (i.e.: anaphylactic or anaphylactoid reactions); endorphine suppression; bradycardia and/or hypotension; water retention and/or peripheral vascular relaxation leading to lower extremity edema and possible stasis ulcers; respiratory depression and/or shortness of breath; decreased metabolic rate leading to weight gain; swelling or edema; medication-induced neural toxicity; particulate matter embolism and blood vessel occlusion with resultant organ, and/or nervous system infarction; and/or intrathecal granuloma formation with possible spinal cord compression and permanent paralysis.  Before refilling the pump Ms. Tesler was informed that some of the medications used in the devise may not be FDA approved for such use and therefore it constitutes an off-label use of the medications.  Finally, she was informed that Medicine is not an exact science; therefore, there is also the possibility of unforeseen or unpredictable risks and/or possible complications that may result in a catastrophic outcome. The patient indicated having understood very clearly. We have given the patient no guarantees and we have made no promises. Enough time was given to the patient to ask questions, all of which were answered to the patient's satisfaction. Ms. Gaines has indicated that she wanted to continue with the procedure. Attestation: I, the ordering provider, attest that I have discussed with the patient the benefits, risks, side-effects, alternatives, likelihood of achieving goals, and potential problems during recovery for the procedure that I have provided informed consent. Date  Time: 01/03/2019 11:14 AM  Pre-Procedure Preparation:  Monitoring: As per clinic protocol. Respiration, ETCO2, SpO2, BP, heart rate and rhythm monitor placed and checked for adequate function Safety Precautions: Patient was assessed for  positional comfort and pressure points before starting the procedure. Time-out: I initiated and conducted the "Time-out" before starting the procedure, as per protocol. The patient was asked to participate by confirming the accuracy of the "Time Out" information. Verification of the correct person, site, and procedure were performed and confirmed by me, the nursing staff, and the patient. "Time-out" conducted as per  Joint Commission's Universal Protocol (UP.01.01.01). Time: 1121  Description of Procedure:          Position: Supine Target Area: Central-port of intrathecal pump. Approach: Anterior, 90 degree angle approach. Area Prepped: Entire Area around the pump implant. Prepping solution: DuraPrep (Iodine Povacrylex [0.7% available iodine] and Isopropyl Alcohol, 74% w/w) Safety Precautions: Aspiration looking for blood return was conducted prior to all injections. At no point did we inject any substances, as a needle was being advanced. No attempts were made at seeking any paresthesias. Safe injection practices and needle disposal techniques used. Medications properly checked for expiration dates. SDV (single dose vial) medications used. Description of the Procedure: Protocol guidelines were followed. Two nurses trained to do implant refills were present during the entire procedure. The refill medication was checked by both healthcare providers as well as the patient. The patient was included in the "Time-out" to verify the medication. The patient was placed in position. The pump was identified. The area was prepped in the usual manner. The sterile template was positioned over the pump, making sure the side-port location matched that of the pump. Both, the pump and the template were held for stability. The needle provided in the Medtronic Kit was then introduced thru the center of the template and into the central port. The pump content was aspirated and discarded volume documented. The new medication  was slowly infused into the pump, thru the filter, making sure to avoid overpressure of the device. The needle was then removed and the area cleansed, making sure to leave some of the prepping solution back to take advantage of its long term bactericidal properties. The pump was interrogated and programmed to reflect the correct medication, volume, and dosage. The program was printed and taken to the physician for approval. Once checked and signed by the physician, a copy was provided to the patient and another scanned into the EMR. Vitals:   01/03/19 1113  BP: 127/65  Pulse: 79  Resp: 18  Temp: 98.5 F (36.9 C)  TempSrc: Oral  SpO2: 95%  Weight: 172 lb (78 kg)  Height: '5\' 2"'  (1.575 m)    Start Time: 1130 hrs. End Time: 1140 hrs. Materials & Medications: Medtronic Refill Kit Medication(s): Please see chart orders for details.  Imaging Guidance:          Type of Imaging Technique: None used Indication(s): N/A Exposure Time: No patient exposure Contrast: None used. Fluoroscopic Guidance: N/A Ultrasound Guidance: N/A Interpretation: N/A  Antibiotic Prophylaxis:   Anti-infectives (From admission, onward)   None     Indication(s): None identified  Post-operative Assessment:  Post-procedure Vital Signs:  Pulse/HCG Rate: 79  Temp: 98.5 F (36.9 C) Resp: 18 BP: 127/65 SpO2: 95 %  EBL: None  Complications: No immediate post-treatment complications observed by team, or reported by patient.  Note: The patient tolerated the entire procedure well. A repeat set of vitals were taken after the procedure and the patient was kept under observation following institutional policy, for this type of procedure. Post-procedural neurological assessment was performed, showing return to baseline, prior to discharge. The patient was provided with post-procedure discharge instructions, including a section on how to identify potential problems. Should any problems arise concerning this procedure,  the patient was given instructions to immediately contact us, at any time, without hesitation. In any case, we plan to contact the patient by telephone for a follow-up status report regarding this interventional procedure.  Comments:  No additional relevant information.  Plan of Care  Orders:  Orders Placed This Encounter  Procedures  . PUMP REFILL    Maintain Protocol by having two(2) healthcare providers during procedure and programming.    Scheduling Instructions:     Please refill intrathecal pump today.    Order Specific Question:   Where will this procedure be performed?    Answer:   [facility]  . PUMP REFILL    Whenever possible schedule on a procedure today.    Standing Status:   Future    Standing Expiration Date:   06/02/2019    Scheduling Instructions:     Please schedule intrathecal pump refill based on pump programming. Avoid schedule intervals of more than 120 days (4 months).    Order Specific Question:   Where will this procedure be performed?    Answer:   [facility]  . Informed Consent Details: Transcribe to consent form and obtain patient signature    Consent Attestation: I, the ordering provider, attest that I have discussed with the patient the benefits, risks, side-effects, alternatives, likelihood of achieving goals, and potential problems during recovery for the procedure that I have provided informed consent.    Scheduling Instructions:     Procedure: Intrathecal Pump Refill     Attending Physician: Beatriz Chancellor A. Dossie Arbour, MD     Indications: Chronic Pain Syndrome (G89.4)   Chronic Opioid Analgesic:  No other oral opioid analgesics prescribed by our practice.   Medications ordered for procedure: No orders of the defined types were placed in this encounter.  Medications administered: [name] had no medications administered during this visit.  See the medical record for exact dosing, route, and time of administration.  Follow-up  plan:   Return for Pump Refill (Max:16mo.       Interventional management options: Planned, scheduled, and/or pending:   Not at this time.   Considering:   Diagnostic left sided lumbar facet block #2 Possible left-sided lumbar facet RFA   Palliative PRN treatment(s):   Palliative Intrathecal pump refillas per programming  Diagnostic left sided lumbar facet block #2      Recent Visits Date Type Provider Dept  10/24/18 Procedure visit NMilinda Pointer MD [facility]  Showing recent visits within past 90 days and meeting all other requirements   Today's Visits Date Type Provider Dept  01/03/19 Procedure visit NMilinda Pointer MD [facility]  Showing today's visits and meeting all other requirements   Future Appointments Date Type Provider Dept  03/19/19 Appointment NMilinda Pointer MD [facility]  Showing future appointments within next 90 days and meeting all other requirements   Disposition: Discharge home  Discharge Date & Time: 01/03/2019; 1150 hrs.   Primary Care Physician: WIva Lento PA-C [facility] Note by: FGaspar Cola MD Date: 01/03/2019; Time: 12:31 PM  Disclaimer:  Medicine is not an eChief Strategy Officer The only guarantee in medicine is that nothing is guaranteed. It is important to note that the decision to proceed with this intervention was based on the information collected from the patient. The Data and conclusions were drawn from the patient's questionnaire, the interview, and the physical examination. Because the information was provided in large part by the patient, it cannot be guaranteed that it has not been purposely or unconsciously manipulated. Every effort has been made to obtain as much relevant data as possible for this evaluation. It is important to note that the conclusions that lead to this procedure are derived in large part from  the available data. Always take  into account that the treatment will also be dependent on availability of resources and existing treatment guidelines, considered by other Pain Management Practitioners as being common knowledge and practice, at the time of the intervention. For Medico-Legal purposes, it is also important to point out that variation in procedural techniques and pharmacological choices are the acceptable norm. The indications, contraindications, technique, and results of the above procedure should only be interpreted and judged by a Board-Certified Interventional Pain Specialist with extensive familiarity and expertise in the same exact procedure and technique.

## 2019-01-04 ENCOUNTER — Telehealth: Payer: Self-pay

## 2019-01-04 NOTE — Telephone Encounter (Signed)
Post procedure phone call.  Patient states she is doing well.  

## 2019-01-04 NOTE — Telephone Encounter (Signed)
Post procedure phone call.  Patient is doing well.

## 2019-01-07 MED FILL — Medication: INTRATHECAL | Qty: 1 | Status: AC

## 2019-02-27 ENCOUNTER — Other Ambulatory Visit: Payer: Self-pay

## 2019-02-27 MED ORDER — PAIN MANAGEMENT IT PUMP REFILL: 1.0000 | Freq: Once | INTRATHECAL | 0 refills | Status: DC

## 2019-03-19 ENCOUNTER — Encounter: Payer: Self-pay | Admitting: Pain Medicine

## 2019-03-19 ENCOUNTER — Other Ambulatory Visit: Payer: Self-pay

## 2019-03-19 ENCOUNTER — Ambulatory Visit: Payer: Medicare Other | Attending: Pain Medicine | Admitting: Pain Medicine

## 2019-03-19 VITALS — BP 97/73 | HR 67 | Temp 98.5°F | Resp 18 | Ht 62.0 in | Wt 170.0 lb

## 2019-03-19 DIAGNOSIS — M961 Postlaminectomy syndrome, not elsewhere classified: Secondary | ICD-10-CM | POA: Diagnosis present

## 2019-03-19 DIAGNOSIS — Z978 Presence of other specified devices: Secondary | ICD-10-CM | POA: Insufficient documentation

## 2019-03-19 DIAGNOSIS — Z451 Encounter for adjustment and management of infusion pump: Secondary | ICD-10-CM | POA: Diagnosis present

## 2019-03-19 DIAGNOSIS — M79605 Pain in left leg: Secondary | ICD-10-CM | POA: Insufficient documentation

## 2019-03-19 DIAGNOSIS — G8929 Other chronic pain: Secondary | ICD-10-CM | POA: Diagnosis present

## 2019-03-19 DIAGNOSIS — K5903 Drug induced constipation: Secondary | ICD-10-CM | POA: Diagnosis present

## 2019-03-19 DIAGNOSIS — Z79891 Long term (current) use of opiate analgesic: Secondary | ICD-10-CM | POA: Insufficient documentation

## 2019-03-19 DIAGNOSIS — M5442 Lumbago with sciatica, left side: Secondary | ICD-10-CM | POA: Diagnosis present

## 2019-03-19 DIAGNOSIS — T402X5A Adverse effect of other opioids, initial encounter: Secondary | ICD-10-CM | POA: Diagnosis present

## 2019-03-19 DIAGNOSIS — G894 Chronic pain syndrome: Secondary | ICD-10-CM | POA: Insufficient documentation

## 2019-03-19 DIAGNOSIS — M533 Sacrococcygeal disorders, not elsewhere classified: Secondary | ICD-10-CM | POA: Insufficient documentation

## 2019-03-19 MED ORDER — BENEFIBER PO POWD: 6.0000 g | Freq: Three times a day (TID) | ORAL | 11 refills | Status: DC

## 2019-03-19 MED ORDER — NALOXEGOL OXALATE 25 MG PO TABS: 25.0000 mg | ORAL_TABLET | Freq: Every day | ORAL | 5 refills | Status: DC

## 2019-03-19 NOTE — Progress Notes (Signed)
Safety precautions to be maintained throughout the outpatient stay will include: orient to surroundings, keep bed in low position, maintain call bell within reach at all times, provide assistance with transfer out of bed and ambulation.  

## 2019-03-19 NOTE — Progress Notes (Addendum)
Patient's Name: Tiffany Hunter  MRN: [id number]  Referring Provider: Iva Lento, PA-C  DOB: 09/16/1945  PCP: Iva Lento, PA-C  DOS: 03/19/2019  Note by: Gaspar Cola, MD  Service setting: Ambulatory outpatient  Specialty: Interventional Pain Management  Patient type: Established  Location: [facility] (AMB) Pain Management Facility  Visit type: Interventional Procedure   Primary Reason for Visit: Interventional Pain Management Treatment. CC: Back Pain  Procedure:          Intrathecal Drug Delivery System (IDDS):  Type: Reservoir Refill [phone]) No rate change Region: Abdominal Laterality: Left  Type of Pump: Medtronic Synchromed II Delivery Route: Intrathecal Type of Pain Treated: Neuropathic/Nociceptive Primary Medication Class: Opioid/opiate  Medication, Concentration, Infusion Program, & Delivery Rate: Please see scanned programming printout.   Indications: 1. 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 Chronic low back pain (Primary Area of Pain) (Left)   4. Chronic lower extremity pain (Secondary Area of pain) (Left)   5. Chronic sacroiliac joint pain [facility] source of pain) (Left)   6. 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 Encounter for adjustment or management of infusion pump   8. Long term current use of opiate analgesic    Pain Assessment: Self-Reported Pain Score: 8 /10             Reported level is compatible with observation.        Pharmacotherapy Assessment  Analgesic: No other oral opioid analgesics prescribed by our practice. Intrathecal PF-Hydromorphone 3.9295 mg/day + PF-Sufentanil 34.383 mcg/day. MME: Aprox. 58.7 mg/day.   Monitoring: Pharmacotherapy: No side-effects or adverse reactions reported. [place] PMP: PDMP reviewed during this encounter.       Compliance: No problems identified. Effectiveness: Clinically acceptable. Plan: Refer to "POC".  UDS: No results found for:  SUMMARY Intrathecal Pump Therapy Assessment  Manufacturer: Medtronic Synchromed Type: Programmable Volume: 40 mL reservoir MRI compatibility: Yes   Drug content:  Primary Medication Class: Opioid Primary Medication:PF-Hydromorphone (Dilaudid)(74m/mL) Secondary Medication:PF-Sufentanil(70 g/mL) Other Medication: No third medication   Programming:  Type: Simple continuous. See pump readout for details.   Changes:  Medication Change: None at this point Rate Change: No change in rate  Reported side-effects or adverse reactions: None reported  Effectiveness: Described as relatively effective, allowing for increase in activities of daily living (ADL) Clinically meaningful improvement in function (CMIF): Sustained CMIF goals met  Plan: Pump refill today  Pre-op Assessment:  Ms. MGadeis a 73y.o. (year old), female patient, seen today for interventional treatment. She  has a past surgical history that includes Carpal tunnel release (Bilateral); Back surgery; Breast surgery (Bilateral); Cholecystectomy; Colonoscopy w/ polypectomy; Cataract extraction w/ intraocular lens implant (Right); Abdominal hysterectomy; Fracture surgery (Right); Cardiac catheterization (2012); and Pain pump revision (Left, 07/07/2017). Ms. MPatnaudehas a current medication list which includes the following prescription(s): aspirin ec, atorvastatin, carvedilol, cholecalciferol, clotrimazole-betamethasone, hydrocortisone, mirabegron er, naloxegol oxalate, oxybutynin, benefiber, albuterol, and PAIN MANAGEMENT IT PUMP REFILL. Her primarily concern today is the Back Pain  Initial Vital Signs:  Pulse/HCG Rate: 67  Temp: 98.5 F (36.9 C) Resp: 18 BP: 97/73 SpO2: 100 %  BMI: Estimated body mass index is 31.09 kg/m as calculated from the following:   Height as of this encounter: '5\' 2"'  (1.575 m).   Weight as of this encounter: 170 lb (77.1 kg).  Risk Assessment: Allergies: Reviewed. She is allergic to  doxycycline hyclate and sulfa antibiotics.  Allergy Precautions: None required Coagulopathies:  Reviewed. None identified.  Blood-thinner therapy: None at this time Active Infection(s): Reviewed. None identified. Ms. Hitzeman is afebrile  Site Confirmation: Ms. Shilling was asked to confirm the procedure and laterality before marking the site Procedure checklist: Completed Consent: Before the procedure and under the influence of no sedative(s), amnesic(s), or anxiolytics, the patient was informed of the treatment options, risks and possible complications. To fulfill our ethical and legal obligations, as recommended by the American Medical Association's Code of Ethics, I have informed the patient of my clinical impression; the nature and purpose of the treatment or procedure; the risks, benefits, and possible complications of the intervention; the alternatives, including doing nothing; the risk(s) and benefit(s) of the alternative treatment(s) or procedure(s); and the risk(s) and benefit(s) of doing nothing.  Ms. Bucker was provided with information about the general risks and possible complications associated with most interventional procedures. These include, but are not limited to: failure to achieve desired goals, infection, bleeding, organ or nerve damage, allergic reactions, paralysis, and/or death.  In addition, she was informed of those risks and possible complications associated to this particular procedure, which include, but are not limited to: damage to the implant; failure to decrease pain; local, systemic, or serious CNS infections, intraspinal abscess with possible cord compression and paralysis, or life-threatening such as meningitis; bleeding; organ damage; nerve injury or damage with subsequent sensory, motor, and/or autonomic system dysfunction, resulting in transient or permanent pain, numbness, and/or weakness of one or several areas of the body; allergic reactions, either minor or  major life-threatening, such as anaphylactic or anaphylactoid reactions.  Furthermore, Ms. Petruska was informed of those risks and complications associated with the medications. These include, but are not limited to: allergic reactions (i.e.: anaphylactic or anaphylactoid reactions); endorphine suppression; bradycardia and/or hypotension; water retention and/or peripheral vascular relaxation leading to lower extremity edema and possible stasis ulcers; respiratory depression and/or shortness of breath; decreased metabolic rate leading to weight gain; swelling or edema; medication-induced neural toxicity; particulate matter embolism and blood vessel occlusion with resultant organ, and/or nervous system infarction; and/or intrathecal granuloma formation with possible spinal cord compression and permanent paralysis.  Before refilling the pump Ms. Bi was informed that some of the medications used in the devise may not be FDA approved for such use and therefore it constitutes an off-label use of the medications.  Finally, she was informed that Medicine is not an exact science; therefore, there is also the possibility of unforeseen or unpredictable risks and/or possible complications that may result in a catastrophic outcome. The patient indicated having understood very clearly. We have given the patient no guarantees and we have made no promises. Enough time was given to the patient to ask questions, all of which were answered to the patient's satisfaction. Ms. Simeone has indicated that she wanted to continue with the procedure. Attestation: I, the ordering provider, attest that I have discussed with the patient the benefits, risks, side-effects, alternatives, likelihood of achieving goals, and potential problems during recovery for the procedure that I have provided informed consent. Date  Time: 03/19/2019 10:44 AM  Pre-Procedure Preparation:  Monitoring: As per clinic protocol. Respiration, ETCO2,  SpO2, BP, heart rate and rhythm monitor placed and checked for adequate function Safety Precautions: Patient was assessed for positional comfort and pressure points before starting the procedure. Time-out: I initiated and conducted the "Time-out" before starting the procedure, as per protocol. The patient was asked to participate by confirming the accuracy of the "Time Out" information. Verification of the correct  person, site, and procedure were performed and confirmed by me, the nursing staff, and the patient. "Time-out" conducted as per Joint Commission's Universal Protocol (UP.01.01.01). Time: 1057  Description of Procedure:          Position: Supine Target Area: Central-port of intrathecal pump. Approach: Anterior, 90 degree angle approach. Area Prepped: Entire Area around the pump implant. Prepping solution: DuraPrep (Iodine Povacrylex [0.7% available iodine] and Isopropyl Alcohol, 74% w/w) Safety Precautions: Aspiration looking for blood return was conducted prior to all injections. At no point did we inject any substances, as a needle was being advanced. No attempts were made at seeking any paresthesias. Safe injection practices and needle disposal techniques used. Medications properly checked for expiration dates. SDV (single dose vial) medications used. Description of the Procedure: Protocol guidelines were followed. Two nurses trained to do implant refills were present during the entire procedure. The refill medication was checked by both healthcare providers as well as the patient. The patient was included in the "Time-out" to verify the medication. The patient was placed in position. The pump was identified. The area was prepped in the usual manner. The sterile template was positioned over the pump, making sure the side-port location matched that of the pump. Both, the pump and the template were held for stability. The needle provided in the Medtronic Kit was then introduced thru the center  of the template and into the central port. The pump content was aspirated and discarded volume documented. The new medication was slowly infused into the pump, thru the filter, making sure to avoid overpressure of the device. The needle was then removed and the area cleansed, making sure to leave some of the prepping solution back to take advantage of its long term bactericidal properties. The pump was interrogated and programmed to reflect the correct medication, volume, and dosage. The program was printed and taken to the physician for approval. Once checked and signed by the physician, a copy was provided to the patient and another scanned into the EMR. Vitals:   03/19/19 1043  BP: 97/73  Pulse: 67  Resp: 18  Temp: 98.5 F (36.9 C)  TempSrc: Oral  SpO2: 100%  Weight: 170 lb (77.1 kg)  Height: '5\' 2"'  (1.575 m)    Start Time: 1100 hrs. End Time: 1110 hrs. Materials & Medications: Medtronic Refill Kit Medication(s): Please see chart orders for details.  Imaging Guidance:          Type of Imaging Technique: None used Indication(s): N/A Exposure Time: No patient exposure Contrast: None used. Fluoroscopic Guidance: N/A Ultrasound Guidance: N/A Interpretation: N/A  Antibiotic Prophylaxis:   Anti-infectives (From admission, onward)   None     Indication(s): None identified  Post-operative Assessment:  Post-procedure Vital Signs:  Pulse/HCG Rate: 67  Temp: 98.5 F (36.9 C) Resp: 18 BP: 97/73 SpO2: 100 %  EBL: None  Complications: No immediate post-treatment complications observed by team, or reported by patient.  Note: The patient tolerated the entire procedure well. A repeat set of vitals were taken after the procedure and the patient was kept under observation following institutional policy, for this type of procedure. Post-procedural neurological assessment was performed, showing return to baseline, prior to discharge. The patient was provided with post-procedure discharge  instructions, including a section on how to identify potential problems. Should any problems arise concerning this procedure, the patient was given instructions to immediately contact us, at any time, without hesitation. In any case, we plan to contact the patient by telephone for  a follow-up status report regarding this interventional procedure.  Comments:  No additional relevant information.  Plan of Care  Orders:  Orders Placed This Encounter  Procedures  . PUMP REFILL    Maintain Protocol by having two(2) healthcare providers during procedure and programming.    Scheduling Instructions:     Please refill intrathecal pump today.    Order Specific Question:   Where will this procedure be performed?    Answer:   [facility]  . PUMP REFILL    Whenever possible schedule on a procedure today.    Standing Status:   Future    Standing Expiration Date:   08/16/2019    Scheduling Instructions:     Please schedule intrathecal pump refill based on pump programming. Avoid schedule intervals of more than 120 days (4 months).    Order Specific Question:   Where will this procedure be performed?    Answer:   [facility]  . Informed Consent Details: Physician/Practitioner Attestation; Transcribe to consent form and obtain patient signature    Provider Attestation: I, [place] Dossie Arbour, MD, (Pain Management Specialist), the physician/practitioner, attest that I have discussed with the patient the benefits, risks, side effects, alternatives, likelihood of achieving goals and potential problems during recovery for the procedure that I have provided informed consent.    Scheduling Instructions:     Procedure: Intrathecal Pump Refill     Attending Physician: Beatriz Chancellor A. Dossie Arbour, MD     Indications: Chronic Pain Syndrome (G89.4)     Transcribe to consent form and obtain patient signature.   Chronic Opioid Analgesic:  No other oral opioid analgesics prescribed by our practice.  Intrathecal PF-Hydromorphone 3.9295 mg/day + PF-Sufentanil 34.383 mcg/day. MME: Aprox. 58.7 mg/day.   Medications ordered for procedure: Meds ordered this encounter  Medications  . Wheat Dextrin (BENEFIBER) POWD    Sig: Take 6 g by mouth 3 (three) times daily before meals. (2 tsp = 6 g)    Dispense:  730 g    Refill:  11    Fill one day early if pharmacy is closed on scheduled refill date. May substitute for generic if available.  . naloxegol oxalate (MOVANTIK) 25 MG TABS tablet    Sig: Take 1 tablet (25 mg total) by mouth daily. Swallow whole on empty stomach, 1 hour before or 2 hours after a meal. Do not break or chew tablet.    Dispense:  30 tablet    Refill:  5    Fill one day early if pharmacy is closed on scheduled refill date. May substitute for generic if available.   Medications administered: [name] had no medications administered during this visit.  See the medical record for exact dosing, route, and time of administration.  Follow-up plan:   Return for Pump Refill (Max:43mo.       Interventional management options: Planned, scheduled, and/or pending:   Not at this time.   Considering:   Diagnostic left sided lumbar facet block #2 Possible left-sided lumbar facet RFA   Palliative PRN treatment(s):   Palliative Intrathecal pump refillas per programming  Diagnostic left sided lumbar facet block #2      Recent Visits Date Type Provider Dept  01/03/19 Procedure visit NMilinda Pointer MD [facility]  Showing recent visits within past 90 days and meeting all other requirements   Today's Visits Date Type Provider Dept  03/19/19 Procedure visit NMilinda Pointer MD [facility]  Showing today's visits and meeting all other  requirements   Future Appointments Date Type Provider Dept  05/21/19 Appointment Milinda Pointer, MD [facility]  Showing future appointments within next 90 days and meeting all other requirements    Disposition: Discharge home  Discharge Date & Time: 03/19/2019; 1130 hrs.   Primary Care Physician: Iva Lento, PA-C Location: [facility] Note by: Gaspar Cola, MD Date: 03/19/2019; Time: 11:56 AM  Disclaimer:  Medicine is not an exact science. The only guarantee in medicine is that nothing is guaranteed. It is important to note that the decision to proceed with this intervention was based on the information collected from the patient. The Data and conclusions were drawn from the patient's questionnaire, the interview, and the physical examination. Because the information was provided in large part by the patient, it cannot be guaranteed that it has not been purposely or unconsciously manipulated. Every effort has been made to obtain as much relevant data as possible for this evaluation. It is important to note that the conclusions that lead to this procedure are derived in large part from the available data. Always take into account that the treatment will also be dependent on availability of resources and existing treatment guidelines, considered by other Pain Management Practitioners as being common knowledge and practice, at the time of the intervention. For Medico-Legal purposes, it is also important to point out that variation in procedural techniques and pharmacological choices are the acceptable norm. The indications, contraindications, technique, and results of the above procedure should only be interpreted and judged by a Board-Certified Interventional Pain Specialist with extensive familiarity and expertise in the same exact procedure and technique.

## 2019-03-20 ENCOUNTER — Telehealth: Payer: Self-pay

## 2019-03-20 NOTE — Telephone Encounter (Signed)
Post pump refill phone call. LM 

## 2019-03-27 MED FILL — Medication: INTRATHECAL | Qty: 1 | Status: AC

## 2019-05-14 ENCOUNTER — Other Ambulatory Visit: Payer: Self-pay

## 2019-05-14 MED ORDER — PAIN MANAGEMENT IT PUMP REFILL: 1.0000 | Freq: Once | INTRATHECAL | 0 refills | Status: DC

## 2019-05-21 ENCOUNTER — Ambulatory Visit: Payer: Medicare Other | Attending: Pain Medicine | Admitting: Pain Medicine

## 2019-05-21 ENCOUNTER — Encounter: Payer: Self-pay | Admitting: Pain Medicine

## 2019-05-21 ENCOUNTER — Other Ambulatory Visit: Payer: Self-pay

## 2019-05-21 VITALS — BP 121/57 | HR 95 | Temp 98.7°F | Resp 16 | Ht 62.0 in | Wt 165.0 lb

## 2019-05-21 DIAGNOSIS — G8929 Other chronic pain: Secondary | ICD-10-CM | POA: Diagnosis present

## 2019-05-21 DIAGNOSIS — M961 Postlaminectomy syndrome, not elsewhere classified: Secondary | ICD-10-CM | POA: Diagnosis present

## 2019-05-21 DIAGNOSIS — M79605 Pain in left leg: Secondary | ICD-10-CM | POA: Diagnosis present

## 2019-05-21 DIAGNOSIS — G894 Chronic pain syndrome: Secondary | ICD-10-CM | POA: Diagnosis present

## 2019-05-21 DIAGNOSIS — M5442 Lumbago with sciatica, left side: Secondary | ICD-10-CM | POA: Diagnosis present

## 2019-05-21 DIAGNOSIS — Z451 Encounter for adjustment and management of infusion pump: Secondary | ICD-10-CM | POA: Diagnosis present

## 2019-05-21 DIAGNOSIS — Z978 Presence of other specified devices: Secondary | ICD-10-CM | POA: Insufficient documentation

## 2019-05-21 NOTE — Progress Notes (Signed)
PROVIDER NOTE: Information contained herein reflects review and annotations entered in association with encounter. Interpretation of such information and data should be left to medically-trained personnel. Information provided to patient can be located elsewhere in the medical record under "Patient Instructions". Document created using STT-dictation technology, any transcriptional errors that may result from process are unintentional.   Patient's Name: Tiffany Hunter  MRN: [id number]  Referring Provider: Iva Lento, PA-C  DOB: 1946/01/22  PCP: Iva Lento, PA-C  DOS: 05/21/2019  Note by: Gaspar Cola, MD  Service setting: Ambulatory outpatient  Specialty: Interventional Pain Management  Patient type: Established  Location: [facility] (AMB) Pain Management Facility  Visit type: Interventional Procedure   Primary Reason for Visit: Interventional Pain Management Treatment. CC: Back Pain (mid)  Procedure:          Intrathecal Drug Delivery System (IDDS):  Type: Reservoir Refill [phone]) No rate change Region: Abdominal Laterality: Left  Type of Pump: Medtronic Synchromed II Delivery Route: Intrathecal Type of Pain Treated: Neuropathic/Nociceptive Primary Medication Class: Opioid/opiate  Medication, Concentration, Infusion Program, & Delivery Rate: Please see scanned programming printout.   Indications: 1. 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 Chronic low back pain (Primary Area of Pain) (Left)   4. Chronic lower extremity pain (Secondary Area of pain) (Left)   5. 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 Encounter for adjustment or management of infusion pump    Pain Assessment: Self-Reported Pain Score: 8 /10             Reported level is compatible with observation.        Pharmacotherapy Assessment  Analgesic: No other oral opioid analgesics prescribed by our practice. Intrathecal PF-Hydromorphone  3.9295 mg/day + PF-Sufentanil 34.383 mcg/day. MME: Aprox. 58.7 mg/day.   Monitoring: Pharmacotherapy: No side-effects or adverse reactions reported. [place] PMP: PDMP reviewed during this encounter.       Compliance: No problems identified. Effectiveness: Clinically acceptable. Plan: Refer to "POC".  UDS: No results found for: SUMMARY Intrathecal Pump Therapy Assessment  Manufacturer: Medtronic Synchromed Type: Programmable Volume: 40 mL reservoir MRI compatibility: Yes   Drug content:  Primary Medication Class: Opioid Primary Medication:PF-Hydromorphone (Dilaudid)(53m/mL) Secondary Medication:PF-Sufentanil(70 g/mL) Other Medication: No third medication   Programming:  Type: Simple continuous. See pump readout for details.   Changes:  Medication Change: None at this point Rate Change: No change in rate  Reported side-effects or adverse reactions: None reported  Effectiveness: Described as relatively effective, allowing for increase in activities of daily living (ADL) Clinically meaningful improvement in function (CMIF): Sustained CMIF goals met  Plan: Pump refill today  Pre-op Assessment:  Ms. MHannanis a 74y.o. (year old), female patient, seen today for interventional treatment. She  has a past surgical history that includes Carpal tunnel release (Bilateral); Back surgery; Breast surgery (Bilateral); Cholecystectomy; Colonoscopy w/ polypectomy; Cataract extraction w/ intraocular lens implant (Right); Abdominal hysterectomy; Fracture surgery (Right); Cardiac catheterization (2012); and Pain pump revision (Left, 07/07/2017). Ms. MPortohas a current medication list which includes the following prescription(s): aspirin ec, atorvastatin, carvedilol, cholecalciferol, clotrimazole-betamethasone, hydrocortisone, mirabegron er, naloxegol oxalate, oxybutynin, benefiber, albuterol, and PAIN MANAGEMENT IT PUMP REFILL. Her primarily concern today is the Back Pain (mid)  Initial  Vital Signs:  Pulse/HCG Rate: 95  Temp: 98.7 F (37.1 C) Resp: 16 BP: (!) 121/57 SpO2:    BMI: Estimated body mass index is 30.18 kg/m as calculated from the following:   Height  as of this encounter: '5\' 2"'  (1.575 m).   Weight as of this encounter: 165 lb (74.8 kg).  Risk Assessment: Allergies: Reviewed. She is allergic to doxycycline hyclate and sulfa antibiotics.  Allergy Precautions: None required Coagulopathies: Reviewed. None identified.  Blood-thinner therapy: None at this time Active Infection(s): Reviewed. None identified. Ms. Bir is afebrile  Site Confirmation: Ms. Sawa was asked to confirm the procedure and laterality before marking the site Procedure checklist: Completed Consent: Before the procedure and under the influence of no sedative(s), amnesic(s), or anxiolytics, the patient was informed of the treatment options, risks and possible complications. To fulfill our ethical and legal obligations, as recommended by the American Medical Association's Code of Ethics, I have informed the patient of my clinical impression; the nature and purpose of the treatment or procedure; the risks, benefits, and possible complications of the intervention; the alternatives, including doing nothing; the risk(s) and benefit(s) of the alternative treatment(s) or procedure(s); and the risk(s) and benefit(s) of doing nothing.  Ms. Kernes was provided with information about the general risks and possible complications associated with most interventional procedures. These include, but are not limited to: failure to achieve desired goals, infection, bleeding, organ or nerve damage, allergic reactions, paralysis, and/or death.  In addition, she was informed of those risks and possible complications associated to this particular procedure, which include, but are not limited to: damage to the implant; failure to decrease pain; local, systemic, or serious CNS infections, intraspinal abscess with  possible cord compression and paralysis, or life-threatening such as meningitis; bleeding; organ damage; nerve injury or damage with subsequent sensory, motor, and/or autonomic system dysfunction, resulting in transient or permanent pain, numbness, and/or weakness of one or several areas of the body; allergic reactions, either minor or major life-threatening, such as anaphylactic or anaphylactoid reactions.  Furthermore, Ms. Mayhall was informed of those risks and complications associated with the medications. These include, but are not limited to: allergic reactions (i.e.: anaphylactic or anaphylactoid reactions); endorphine suppression; bradycardia and/or hypotension; water retention and/or peripheral vascular relaxation leading to lower extremity edema and possible stasis ulcers; respiratory depression and/or shortness of breath; decreased metabolic rate leading to weight gain; swelling or edema; medication-induced neural toxicity; particulate matter embolism and blood vessel occlusion with resultant organ, and/or nervous system infarction; and/or intrathecal granuloma formation with possible spinal cord compression and permanent paralysis.  Before refilling the pump Ms. Blakney was informed that some of the medications used in the devise may not be FDA approved for such use and therefore it constitutes an off-label use of the medications.  Finally, she was informed that Medicine is not an exact science; therefore, there is also the possibility of unforeseen or unpredictable risks and/or possible complications that may result in a catastrophic outcome. The patient indicated having understood very clearly. We have given the patient no guarantees and we have made no promises. Enough time was given to the patient to ask questions, all of which were answered to the patient's satisfaction. Ms. Ostenson has indicated that she wanted to continue with the procedure. Attestation: I, the ordering provider, attest  that I have discussed with the patient the benefits, risks, side-effects, alternatives, likelihood of achieving goals, and potential problems during recovery for the procedure that I have provided informed consent. Date  Time: 05/21/2019 10:40 AM  Pre-Procedure Preparation:  Monitoring: As per clinic protocol. Respiration, ETCO2, SpO2, BP, heart rate and rhythm monitor placed and checked for adequate function Safety Precautions: Patient was assessed for positional comfort and  pressure points before starting the procedure. Time-out: I initiated and conducted the "Time-out" before starting the procedure, as per protocol. The patient was asked to participate by confirming the accuracy of the "Time Out" information. Verification of the correct person, site, and procedure were performed and confirmed by me, the nursing staff, and the patient. "Time-out" conducted as per Joint Commission's Universal Protocol (UP.01.01.01). Time: 1049  Description of Procedure:          Position: Supine Target Area: Central-port of intrathecal pump. Approach: Anterior, 90 degree angle approach. Area Prepped: Entire Area around the pump implant. Prepping solution: DuraPrep (Iodine Povacrylex [0.7% available iodine] and Isopropyl Alcohol, 74% w/w) Safety Precautions: Aspiration looking for blood return was conducted prior to all injections. At no point did we inject any substances, as a needle was being advanced. No attempts were made at seeking any paresthesias. Safe injection practices and needle disposal techniques used. Medications properly checked for expiration dates. SDV (single dose vial) medications used. Description of the Procedure: Protocol guidelines were followed. Two nurses trained to do implant refills were present during the entire procedure. The refill medication was checked by both healthcare providers as well as the patient. The patient was included in the "Time-out" to verify the medication. The patient  was placed in position. The pump was identified. The area was prepped in the usual manner. The sterile template was positioned over the pump, making sure the side-port location matched that of the pump. Both, the pump and the template were held for stability. The needle provided in the Medtronic Kit was then introduced thru the center of the template and into the central port. The pump content was aspirated and discarded volume documented. The new medication was slowly infused into the pump, thru the filter, making sure to avoid overpressure of the device. The needle was then removed and the area cleansed, making sure to leave some of the prepping solution back to take advantage of its long term bactericidal properties. The pump was interrogated and programmed to reflect the correct medication, volume, and dosage. The program was printed and taken to the physician for approval. Once checked and signed by the physician, a copy was provided to the patient and another scanned into the EMR. Vitals:   05/21/19 1037  BP: (!) 121/57  Pulse: 95  Resp: 16  Temp: 98.7 F (37.1 C)  Weight: 165 lb (74.8 kg)  Height: '5\' 2"'  (1.575 m)    Start Time: 1049 hrs. End Time: 1102 hrs. Materials & Medications: Medtronic Refill Kit Medication(s): Please see chart orders for details.  Imaging Guidance:          Type of Imaging Technique: None used Indication(s): N/A Exposure Time: No patient exposure Contrast: None used. Fluoroscopic Guidance: N/A Ultrasound Guidance: N/A Interpretation: N/A  Antibiotic Prophylaxis:   Anti-infectives (From admission, onward)   None     Indication(s): None identified  Post-operative Assessment:  Post-procedure Vital Signs:  Pulse/HCG Rate: 95  Temp: 98.7 F (37.1 C) Resp: 16 BP: (!) 121/57 SpO2:    EBL: None  Complications: No immediate post-treatment complications observed by team, or reported by patient.  Note: The patient tolerated the entire procedure well.  A repeat set of vitals were taken after the procedure and the patient was kept under observation following institutional policy, for this type of procedure. Post-procedural neurological assessment was performed, showing return to baseline, prior to discharge. The patient was provided with post-procedure discharge instructions, including a section on how to identify  potential problems. Should any problems arise concerning this procedure, the patient was given instructions to immediately contact us, at any time, without hesitation. In any case, we plan to contact the patient by telephone for a follow-up status report regarding this interventional procedure.  Comments:  No additional relevant information.  Plan of Care  Orders:  Orders Placed This Encounter  Procedures  . Pump Refill (Today)    Maintain Protocol by having two(2) healthcare providers during procedure and programming.    Scheduling Instructions:     Please refill intrathecal pump today.    Order Specific Question:   Where will this procedure be performed?    Answer:   [facility]  . Pump Refill (Schedule Return)    Whenever possible schedule on a procedure today.    Standing Status:   Future    Standing Expiration Date:   10/18/2019    Scheduling Instructions:     Please schedule intrathecal pump refill based on pump programming. Avoid schedule intervals of more than 120 days (4 months).    Order Specific Question:   Where will this procedure be performed?    Answer:   [facility]  . Consent: Pump Refill    Provider Attestation: I, [place] Dossie Arbour, MD, (Pain Management Specialist), the physician/practitioner, attest that I have discussed with the patient the benefits, risks, side effects, alternatives, likelihood of achieving goals and potential problems during recovery for the procedure that I have provided informed consent.    Scheduling Instructions:     Procedure: Intrathecal Pump Refill     Attending  Physician: Beatriz Chancellor A. Dossie Arbour, MD     Indications: Chronic Pain Syndrome (G89.4)     Transcribe to consent form and obtain patient signature.   Chronic Opioid Analgesic:  No other oral opioid analgesics prescribed by our practice. Intrathecal PF-Hydromorphone 3.9295 mg/day + PF-Sufentanil 34.383 mcg/day. MME: Aprox. 58.7 mg/day.   Medications ordered for procedure: No orders of the defined types were placed in this encounter.  Medications administered: [name] had no medications administered during this visit.  See the medical record for exact dosing, route, and time of administration.  Follow-up plan:   Return for Pump Refill (Max:64mo.       Interventional management options: Planned, scheduled, and/or pending:   Not at this time.   Considering:   Diagnostic left sided lumbar facet block #2 Possible left-sided lumbar facet RFA   Palliative PRN treatment(s):   Palliative Intrathecal pump refillas per programming  Diagnostic left sided lumbar facet block #2      Recent Visits Date Type Provider Dept  03/19/19 Procedure visit NMilinda Pointer MD [facility]  Showing recent visits within past 90 days and meeting all other requirements   Today's Visits Date Type Provider Dept  05/21/19 Procedure visit NMilinda Pointer MD [facility]  Showing today's visits and meeting all other requirements   Future Appointments No visits were found meeting these conditions.  Showing future appointments within next 90 days and meeting all other requirements   Disposition: Discharge home  Discharge Date & Time: 05/21/2019; 1103 hrs.   Primary Care Physician: WIva Lento PA-C [facility] Note by: FGaspar Cola MD Date: 05/21/2019; Time: 11:08 AM  Disclaimer:  Medicine is not an eChief Strategy Officer The only guarantee in medicine is that nothing is guaranteed. It is important to note that the decision to  proceed with this intervention was based on the information collected from the  patient. The Data and conclusions were drawn from the patient's questionnaire, the interview, and the physical examination. Because the information was provided in large part by the patient, it cannot be guaranteed that it has not been purposely or unconsciously manipulated. Every effort has been made to obtain as much relevant data as possible for this evaluation. It is important to note that the conclusions that lead to this procedure are derived in large part from the available data. Always take into account that the treatment will also be dependent on availability of resources and existing treatment guidelines, considered by other Pain Management Practitioners as being common knowledge and practice, at the time of the intervention. For Medico-Legal purposes, it is also important to point out that variation in procedural techniques and pharmacological choices are the acceptable norm. The indications, contraindications, technique, and results of the above procedure should only be interpreted and judged by a Board-Certified Interventional Pain Specialist with extensive familiarity and expertise in the same exact procedure and technique.

## 2019-05-21 NOTE — Progress Notes (Signed)
Talked with Patient about need to wait for MD to check Paper Printed for her procedure today. Talked about the importance of him checking to verify that everything was done correctly. Patient with understanding. Short form given for Ms Parys to keep in [place].

## 2019-05-22 ENCOUNTER — Telehealth: Payer: Self-pay

## 2019-05-22 NOTE — Telephone Encounter (Signed)
Post IT pump refill phone call.  Patient states she is doing well.

## 2019-05-31 MED FILL — Medication: INTRATHECAL | Qty: 1 | Status: AC

## 2019-07-11 ENCOUNTER — Other Ambulatory Visit: Payer: Self-pay

## 2019-07-11 MED ORDER — PAIN MANAGEMENT IT PUMP REFILL: 1.0000 | Freq: Once | INTRATHECAL | 0 refills | Status: DC

## 2019-07-30 ENCOUNTER — Other Ambulatory Visit: Payer: Self-pay

## 2019-07-30 ENCOUNTER — Ambulatory Visit: Payer: Medicare Other | Attending: Pain Medicine | Admitting: Pain Medicine

## 2019-07-30 ENCOUNTER — Encounter: Payer: Self-pay | Admitting: Pain Medicine

## 2019-07-30 VITALS — BP 133/62 | HR 65 | Temp 97.0°F | Resp 16 | Ht 62.5 in | Wt 165.0 lb

## 2019-07-30 DIAGNOSIS — Z789 Other specified health status: Secondary | ICD-10-CM | POA: Diagnosis present

## 2019-07-30 DIAGNOSIS — G8929 Other chronic pain: Secondary | ICD-10-CM | POA: Diagnosis present

## 2019-07-30 DIAGNOSIS — G894 Chronic pain syndrome: Secondary | ICD-10-CM | POA: Diagnosis present

## 2019-07-30 DIAGNOSIS — Z79899 Other long term (current) drug therapy: Secondary | ICD-10-CM | POA: Insufficient documentation

## 2019-07-30 DIAGNOSIS — F1190 Opioid use, unspecified, uncomplicated: Secondary | ICD-10-CM

## 2019-07-30 DIAGNOSIS — Z978 Presence of other specified devices: Secondary | ICD-10-CM | POA: Insufficient documentation

## 2019-07-30 DIAGNOSIS — M79605 Pain in left leg: Secondary | ICD-10-CM | POA: Diagnosis present

## 2019-07-30 DIAGNOSIS — M47816 Spondylosis without myelopathy or radiculopathy, lumbar region: Secondary | ICD-10-CM

## 2019-07-30 DIAGNOSIS — M899 Disorder of bone, unspecified: Secondary | ICD-10-CM | POA: Insufficient documentation

## 2019-07-30 DIAGNOSIS — Z79891 Long term (current) use of opiate analgesic: Secondary | ICD-10-CM | POA: Insufficient documentation

## 2019-07-30 DIAGNOSIS — M5416 Radiculopathy, lumbar region: Secondary | ICD-10-CM | POA: Diagnosis present

## 2019-07-30 DIAGNOSIS — M5137 Other intervertebral disc degeneration, lumbosacral region: Secondary | ICD-10-CM | POA: Diagnosis present

## 2019-07-30 DIAGNOSIS — Z451 Encounter for adjustment and management of infusion pump: Secondary | ICD-10-CM | POA: Insufficient documentation

## 2019-07-30 DIAGNOSIS — M5442 Lumbago with sciatica, left side: Secondary | ICD-10-CM | POA: Insufficient documentation

## 2019-07-30 DIAGNOSIS — M961 Postlaminectomy syndrome, not elsewhere classified: Secondary | ICD-10-CM | POA: Diagnosis present

## 2019-07-30 MED FILL — Medication: INTRATHECAL | Qty: 1 | Status: AC

## 2019-07-30 NOTE — Progress Notes (Signed)
Safety precautions to be maintained throughout the outpatient stay will include: orient to surroundings, keep bed in low position, maintain call bell within reach at all times, provide assistance with transfer out of bed and ambulation.  

## 2019-07-30 NOTE — Progress Notes (Signed)
PROVIDER NOTE: Information contained herein reflects review and annotations entered in association with encounter. Interpretation of such information and data should be left to medically-trained personnel. Information provided to patient can be located elsewhere in the medical record under "Patient Instructions". Document created using STT-dictation technology, any transcriptional errors that may result from process are unintentional.    Patient: Tiffany Hunter  Service Category: Procedure  Provider: Gaspar Cola, MD  DOB: 10-29-1945  DOS: 07/30/2019  Location: [facility]  MRN: [id number]  Setting: Ambulatory - outpatient  Referring Provider: Iva Lento, PA-C  Type: Established Patient  Specialty: Interventional Pain Management  PCP: Iva Lento, PA-C   Primary Reason for Visit: Interventional Pain Management Treatment. CC: Back Pain (lumbar bilateral )  Procedure:          Intrathecal Drug Delivery System (IDDS):  Type: Reservoir Refill [phone]) No rate change Region: Abdominal Laterality: Left  Type of Pump: Medtronic Synchromed II Delivery Route: Intrathecal Type of Pain Treated: Neuropathic/Nociceptive Primary Medication Class: Opioid/opiate  Medication, Concentration, Infusion Program, & Delivery Rate: Please see scanned programming printout.   Indications: 1. Chronic pain syndrome   2. DDD (degenerative disc disease), lumbosacral   3. Chronic low back pain (Primary Area of Pain) (Left)   4. Chronic lower extremity pain (Secondary Area of pain) (Left)   5. 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 Lumbar facet joint syndrome (Left)   8. Lumbar spondylosis   9. 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 Encounter for adjustment or management of infusion pump   11. Long term current use of opiate analgesic    Pain Assessment: Self-Reported Pain  Score: 8 /10             Reported level is compatible with observation.        Pharmacotherapy Assessment  Analgesic: No other oral opioid analgesics prescribed by our practice. Intrathecal PF-Hydromorphone 3.9295 mg/day + PF-Sufentanil 34.383 mcg/day. MME: Aprox. 58.7 mg/day.   Monitoring: [place] PMP: PDMP reviewed during this encounter.       Pharmacotherapy: No side-effects or adverse reactions reported. Compliance: No problems identified. Effectiveness: Clinically acceptable. Plan: Refer to "POC".  UDS: No results found for: SUMMARY Intrathecal Pump Therapy Assessment  Manufacturer: Medtronic Synchromed Type: Programmable Volume: 40 mL reservoir MRI compatibility: Yes   Drug content:  Primary Medication Class:Opioid Primary Medication:PF-Hydromorphone (Dilaudid)(7m/mL) Secondary Medication:PF-Sufentanil(70 g/mL) Other Medication:No third medication   Programming:  Type: Simple continuous. See pump readout for details.   Changes:  Medication Change: None at this point Rate Change: No change in rate  Reported side-effects or adverse reactions: None reported  Effectiveness: Described as relatively effective, allowing for increase in activities of daily living (ADL) Clinically meaningful improvement in function (CMIF): Sustained CMIF goals met  Plan: Pump refill today  Pre-op Assessment:  Tiffany Hunter a 74y.o. (year old), female patient, seen today for interventional treatment. She  has a past surgical history that includes Carpal tunnel release (Bilateral); Back surgery; Breast surgery (Bilateral); Cholecystectomy; Colonoscopy w/ polypectomy; Cataract extraction w/ intraocular lens implant (Right); Abdominal hysterectomy; Fracture surgery (Right); Cardiac catheterization (2012); and Pain pump revision (Left, 07/07/2017). Ms. MRezekhas a current medication list which includes the following prescription(s): aspirin ec, atorvastatin, carvedilol, cholecalciferol,  clotrimazole-betamethasone, hydrocortisone, mirabegron er, naloxegol oxalate, oxybutynin, benefiber, albuterol, and PAIN MANAGEMENT IT PUMP REFILL. Her primarily concern today is the Back Pain (lumbar bilateral )  Initial Vital Signs:  Pulse/HCG Rate: 65  Temp: (!) 97 F (36.1 C) Resp: 16 BP: 133/62 SpO2: 96 %  BMI: Estimated body mass index is 29.7 kg/m as calculated from the following:   Height as of this encounter: 5' 2.5" (1.588 m).   Weight as of this encounter: 165 lb (74.8 kg).  Risk Assessment: Allergies: Reviewed. She is allergic to doxycycline hyclate and sulfa antibiotics.  Allergy Precautions: None required Coagulopathies: Reviewed. None identified.  Blood-thinner therapy: None at this time Active Infection(s): Reviewed. None identified. Tiffany Hunter is afebrile  Site Confirmation: Tiffany Hunter was asked to confirm the procedure and laterality before marking the site Procedure checklist: Completed Consent: Before the procedure and under the influence of no sedative(s), amnesic(s), or anxiolytics, the patient was informed of the treatment options, risks and possible complications. To fulfill our ethical and legal obligations, as recommended by the American Medical Association's Code of Ethics, I have informed the patient of my clinical impression; the nature and purpose of the treatment or procedure; the risks, benefits, and possible complications of the intervention; the alternatives, including doing nothing; the risk(s) and benefit(s) of the alternative treatment(s) or procedure(s); and the risk(s) and benefit(s) of doing nothing.  [name] was provided with information about the general risks and possible complications associated with most interventional procedures. These include, but are not limited to: failure to achieve desired goals, infection, bleeding, organ or nerve damage, allergic reactions, paralysis, and/or death.  In addition, she was informed of those risks  and possible complications associated to this particular procedure, which include, but are not limited to: damage to the implant; failure to decrease pain; local, systemic, or serious CNS infections, intraspinal abscess with possible cord compression and paralysis, or life-threatening such as meningitis; bleeding; organ damage; nerve injury or damage with subsequent sensory, motor, and/or autonomic system dysfunction, resulting in transient or permanent pain, numbness, and/or weakness of one or several areas of the body; allergic reactions, either minor or major life-threatening, such as anaphylactic or anaphylactoid reactions.  Furthermore, Ms. Spacek was informed of those risks and complications associated with the medications. These include, but are not limited to: allergic reactions (i.e.: anaphylactic or anaphylactoid reactions); endorphine suppression; bradycardia and/or hypotension; water retention and/or peripheral vascular relaxation leading to lower extremity edema and possible stasis ulcers; respiratory depression and/or shortness of breath; decreased metabolic rate leading to weight gain; swelling or edema; medication-induced neural toxicity; particulate matter embolism and blood vessel occlusion with resultant organ, and/or nervous system infarction; and/or intrathecal granuloma formation with possible spinal cord compression and permanent paralysis.  Before refilling the pump Ms. Landenberger was informed that some of the medications used in the devise may not be FDA approved for such use and therefore it constitutes an off-label use of the medications.  Finally, she was informed that Medicine is not an exact science; therefore, there is also the possibility of unforeseen or unpredictable risks and/or possible complications that may result in a catastrophic outcome. The patient indicated having understood very clearly. We have given the patient no guarantees and we have made no promises. Enough  time was given to the patient to ask questions, all of which were answered to the patient's satisfaction. Ms. Riga has indicated that she wanted to continue with the procedure. Attestation: I, the ordering provider, attest that I have discussed with the patient the benefits, risks, side-effects, alternatives, likelihood of achieving goals, and potential problems during recovery for the procedure that I have provided informed consent. Date  Time: 07/30/2019 11:29 AM  Pre-Procedure Preparation:  Monitoring: As per clinic protocol. Respiration, ETCO2, SpO2, BP, heart rate and rhythm monitor placed and checked for adequate function Safety Precautions: Patient was assessed for positional comfort and pressure points before starting the procedure. Time-out: I initiated and conducted the "Time-out" before starting the procedure, as per protocol. The patient was asked to participate by confirming the accuracy of the "Time Out" information. Verification of the correct person, site, and procedure were performed and confirmed by me, the nursing staff, and the patient. "Time-out" conducted as per Joint Commission's Universal Protocol (UP.01.01.01). Time: 1139  Description of Procedure:          Position: Supine Target Area: Central-port of intrathecal pump. Approach: Anterior, 90 degree angle approach. Area Prepped: Entire Area around the pump implant. Prepping solution: DuraPrep (Iodine Povacrylex [0.7% available iodine] and Isopropyl Alcohol, 74% w/w) Safety Precautions: Aspiration looking for blood return was conducted prior to all injections. At no point did we inject any substances, as a needle was being advanced. No attempts were made at seeking any paresthesias. Safe injection practices and needle disposal techniques used. Medications properly checked for expiration dates. SDV (single dose vial) medications used. Description of the Procedure: Protocol guidelines were followed. Two nurses trained to do  implant refills were present during the entire procedure. The refill medication was checked by both healthcare providers as well as the patient. The patient was included in the "Time-out" to verify the medication. The patient was placed in position. The pump was identified. The area was prepped in the usual manner. The sterile template was positioned over the pump, making sure the side-port location matched that of the pump. Both, the pump and the template were held for stability. The needle provided in the Medtronic Kit was then introduced thru the center of the template and into the central port. The pump content was aspirated and discarded volume documented. The new medication was slowly infused into the pump, thru the filter, making sure to avoid overpressure of the device. The needle was then removed and the area cleansed, making sure to leave some of the prepping solution back to take advantage of its long term bactericidal properties. The pump was interrogated and programmed to reflect the correct medication, volume, and dosage. The program was printed and taken to the physician for approval. Once checked and signed by the physician, a copy was provided to the patient and another scanned into the EMR. Vitals:   07/30/19 1128  BP: 133/62  Pulse: 65  Resp: 16  Temp: (!) 97 F (36.1 C)  TempSrc: Temporal  SpO2: 96%  Weight: 165 lb (74.8 kg)  Height: 5' 2.5" (1.588 m)    Start Time: 1139 hrs. End Time: 1148 hrs. Materials & Medications: Medtronic Refill Kit Medication(s): Please see chart orders for details.  Imaging Guidance:          Type of Imaging Technique: None used Indication(s): N/A Exposure Time: No patient exposure Contrast: None used. Fluoroscopic Guidance: N/A Ultrasound Guidance: N/A Interpretation: N/A  Antibiotic Prophylaxis:   Anti-infectives (From admission, onward)   None     Indication(s): None identified  Post-operative Assessment:  Post-procedure Vital  Signs:  Pulse/HCG Rate: 65  Temp: (!) 97 F (36.1 C) Resp: 16 BP: 133/62 SpO2: 96 %  EBL: None  Complications: No immediate post-treatment complications observed by team, or reported by patient.  Note: The patient tolerated the entire procedure well. A repeat set of vitals were taken after the  procedure and the patient was kept under observation following institutional policy, for this type of procedure. Post-procedural neurological assessment was performed, showing return to baseline, prior to discharge. The patient was provided with post-procedure discharge instructions, including a section on how to identify potential problems. Should any problems arise concerning this procedure, the patient was given instructions to immediately contact us, at any time, without hesitation. In any case, we plan to contact the patient by telephone for a follow-up status report regarding this interventional procedure.  Comments:  No additional relevant information.  Plan of Care  Orders:  Orders Placed This Encounter  Procedures  . PUMP REFILL    Maintain Protocol by having two(2) healthcare providers during procedure and programming.    Scheduling Instructions:     Please refill intrathecal pump today.    Order Specific Question:   Where will this procedure be performed?    Answer:   [facility]  . PUMP REFILL    Whenever possible schedule on a procedure today.    Standing Status:   Future    Standing Expiration Date:   12/27/2019    Scheduling Instructions:     Please schedule intrathecal pump refill based on pump programming. Avoid schedule intervals of more than 120 days (4 months).    Order Specific Question:   Where will this procedure be performed?    Answer:   [facility]  . Compliance Drug Analysis, Ur    Volume: 30 ml(s). Minimum 3 ml of urine is needed. Document temperature of fresh sample. Indications: Long term (current) use of opiate analgesic (L93.790) Test#:  [id number] (Comprehensive Profile)    Order Specific Question:   Release to patient    Answer:   Immediate  . Comp. Metabolic Panel (12)    With GFR. Indications: Chronic Pain Syndrome (G89.4) & Pharmacotherapy (Z32.992)    Order Specific Question:   Has the patient fasted?    Answer:   No    Order Specific Question:   CC Results    Answer:   [id number] [[id number]]    Order Specific Question:   Release to patient    Answer:   Immediate  . Magnesium    Indication: Pharmacologic therapy (Z79.899)    Order Specific Question:   CC Results    Answer:   PCP-NURSE [[id number]]    Order Specific Question:   Release to patient    Answer:   Immediate  . Vitamin B12    Indication: Pharmacologic therapy (H41.740).    Order Specific Question:   CC Results    Answer:   [id number] [[id number]]    Order Specific Question:   Release to patient    Answer:   Immediate  . Sedimentation rate    Indication: Disorder of skeletal system (M89.9)    Order Specific Question:   CC Results    Answer:   PCP-NURSE [[id number]]    Order Specific Question:   Release to patient    Answer:   Immediate  . 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    Order Specific Question:   CC Results    Answer:   [id number] [[id number]]    Order Specific Question:   Release to patient    Answer:   Immediate  . C-reactive protein    Indication: Problems influencing health status (Z78.9)    Order Specific Question:   CC Results    Answer:   PCP-NURSE [[id number]]  Order Specific Question:   Release to patient    Answer:   Immediate  . Informed Consent Details: Physician/Practitioner Attestation; Transcribe to consent form and obtain patient signature    Provider Attestation: I, Erda Dossie Arbour, MD, (Pain Management Specialist), the physician/practitioner, attest that I have discussed with the patient the benefits, risks, side effects, alternatives, likelihood of achieving goals and potential problems  during recovery for the procedure that I have provided informed consent.    Scheduling Instructions:     Procedure: Intrathecal Pump Refill     Attending Physician: Beatriz Chancellor A. Dossie Arbour, MD     Indications: Chronic Pain Syndrome (G89.4)     Transcribe to consent form and obtain patient signature.  . Nursing Instructions:    1. Please arrange for the patient to have blood work done today.  2. Inform patient that return appointment is contingent on timely Lab work completion. 3. Remind patient that Lab orders have an expiration date. After such date, the system will automatically delete the order(s) and I will not be resubmitting them, therefore, no return appointment will be scheduled.    Scheduling Instructions:     Send to Lab today.  . Nursing Instructions:    1). STAT: UDS required today. 2). Make sure to document all opioids and benzodiazepines taken, including time of last intake. 3). If order is entered on a procedure day, make sure sample is obtained before any medications are administered.  . Lab instructions    Inform patient that follow-up appointment will be rescheduled if studies are not available.    Scheduling Instructions:     Please arrange for the patient to have blood work done today.     Remind patient that Lab orders are good for only 10 days. After 10 days, the orders will be automatically deleted and we will not be resubmitting them.  . Nursing communication    Scheduling Instructions:     Complete/update the opioid risk tool (ORT) questionnaire.   Chronic Opioid Analgesic:  No other oral opioid analgesics prescribed by our practice. Intrathecal PF-Hydromorphone 3.9295 mg/day + PF-Sufentanil 34.383 mcg/day. MME: Aprox. 58.7 mg/day.   Medications ordered for procedure: No orders of the defined types were placed in this encounter.  Medications administered: Tiffany Hunter had no medications administered during this visit.  See the medical record for exact dosing,  route, and time of administration.  Follow-up plan:   Return for Pump Refill (Max:24mo.       Interventional management options: Planned, scheduled, and/or pending:   Not at this time.   Considering:   Diagnostic left sided lumbar facet block #2 Possible left-sided lumbar facet RFA   Palliative PRN treatment(s):   Palliative Intrathecal pump refillas per programming  Diagnostic left sided lumbar facet block #2       Recent Visits Date Type Provider Dept  05/21/19 Procedure visit NMilinda Pointer MD [facility]  Showing recent visits within past 90 days and meeting all other requirements   Today's Visits Date Type Provider Dept  07/30/19 Procedure visit NMilinda Pointer MD [facility]  Showing today's visits and meeting all other requirements   Future Appointments Date Type Provider Dept  10/01/19 Appointment NMilinda Pointer MD [facility]  Showing future appointments within next 90 days and meeting all other requirements   Disposition: Discharge home  Discharge (Date  Time): 07/30/2019; 1205 hrs.   Primary Care Physician: WIva Lento PA-C [facility] Note by: FGaspar Cola  MD Date: 07/30/2019; Time: 12:35 PM  Disclaimer:  Medicine is not an Chief Strategy Officer. The only guarantee in medicine is that nothing is guaranteed. It is important to note that the decision to proceed with this intervention was based on the information collected from the patient. The Data and conclusions were drawn from the patient's questionnaire, the interview, and the physical examination. Because the information was provided in large part by the patient, it cannot be guaranteed that it has not been purposely or unconsciously manipulated. Every effort has been made to obtain as much relevant data as possible for this evaluation. It is important to note that the conclusions that lead to this procedure are derived  in large part from the available data. Always take into account that the treatment will also be dependent on availability of resources and existing treatment guidelines, considered by other Pain Management Practitioners as being common knowledge and practice, at the time of the intervention. For Medico-Legal purposes, it is also important to point out that variation in procedural techniques and pharmacological choices are the acceptable norm. The indications, contraindications, technique, and results of the above procedure should only be interpreted and judged by a Board-Certified Interventional Pain Specialist with extensive familiarity and expertise in the same exact procedure and technique.

## 2019-07-31 ENCOUNTER — Telehealth: Payer: Self-pay

## 2019-07-31 NOTE — Telephone Encounter (Signed)
Post procedure phone call.  Patient states she is doing better today.

## 2019-08-02 LAB — COMPLIANCE DRUG ANALYSIS, UR

## 2019-08-04 LAB — 25-HYDROXY VITAMIN D LCMS D2+D3
25-Hydroxy, Vitamin D-2: 1.1 ng/mL
25-Hydroxy, Vitamin D-3: 41 ng/mL
25-Hydroxy, Vitamin D: 42 ng/mL

## 2019-08-04 LAB — SEDIMENTATION RATE: Sed Rate: 18 mm/hr (ref 0–40)

## 2019-08-04 LAB — COMP. METABOLIC PANEL (12)
AST: 14 IU/L (ref 0–40)
Albumin/Globulin Ratio: 1.9 (ref 1.2–2.2)
Albumin: 3.9 g/dL (ref 3.7–4.7)
Alkaline Phosphatase: 91 IU/L (ref 39–117)
BUN/Creatinine Ratio: 14 (ref 12–28)
BUN: 8 mg/dL (ref 8–27)
Bilirubin Total: 0.6 mg/dL (ref 0.0–1.2)
Calcium: 8.7 mg/dL (ref 8.7–10.3)
Chloride: 100 mmol/L (ref 96–106)
Creatinine, Ser: 0.56 mg/dL — ABNORMAL LOW (ref 0.57–1.00)
GFR calc Af Amer: 107 mL/min/{1.73_m2} (ref >59)
GFR calc non Af Amer: 93 mL/min/{1.73_m2} (ref >59)
Globulin, Total: 2.1 g/dL (ref 1.5–4.5)
Glucose: 115 mg/dL — ABNORMAL HIGH (ref 65–99)
Potassium: 3.8 mmol/L (ref 3.5–5.2)
Sodium: 143 mmol/L (ref 134–144)
Total Protein: 6 g/dL (ref 6.0–8.5)

## 2019-08-04 LAB — C-REACTIVE PROTEIN: CRP: 12 mg/L — ABNORMAL HIGH (ref 0–10)

## 2019-08-04 LAB — VITAMIN B12: Vitamin B-12: 370 pg/mL (ref 232–1245)

## 2019-08-04 LAB — MAGNESIUM: Magnesium: 1.7 mg/dL (ref 1.6–2.3)

## 2019-08-22 DIAGNOSIS — E785 Hyperlipidemia, unspecified: Secondary | ICD-10-CM | POA: Insufficient documentation

## 2019-09-11 ENCOUNTER — Other Ambulatory Visit: Payer: Self-pay

## 2019-09-11 MED ORDER — PAIN MANAGEMENT IT PUMP REFILL: 1.0000 | Freq: Once | INTRATHECAL | 0 refills | Status: AC

## 2019-10-01 ENCOUNTER — Ambulatory Visit: Payer: Medicare Other | Attending: Pain Medicine | Admitting: Pain Medicine

## 2019-10-01 ENCOUNTER — Other Ambulatory Visit: Payer: Self-pay

## 2019-10-01 ENCOUNTER — Encounter: Payer: Self-pay | Admitting: Pain Medicine

## 2019-10-01 VITALS — BP 119/87 | HR 71 | Temp 97.2°F | Ht 64.0 in | Wt 170.0 lb

## 2019-10-01 DIAGNOSIS — Z79891 Long term (current) use of opiate analgesic: Secondary | ICD-10-CM

## 2019-10-01 DIAGNOSIS — M549 Dorsalgia, unspecified: Secondary | ICD-10-CM | POA: Diagnosis present

## 2019-10-01 DIAGNOSIS — Z79899 Other long term (current) drug therapy: Secondary | ICD-10-CM | POA: Diagnosis not present

## 2019-10-01 DIAGNOSIS — Z881 Allergy status to other antibiotic agents status: Secondary | ICD-10-CM | POA: Insufficient documentation

## 2019-10-01 DIAGNOSIS — M4726 Other spondylosis with radiculopathy, lumbar region: Secondary | ICD-10-CM | POA: Insufficient documentation

## 2019-10-01 DIAGNOSIS — M961 Postlaminectomy syndrome, not elsewhere classified: Secondary | ICD-10-CM

## 2019-10-01 DIAGNOSIS — M5137 Other intervertebral disc degeneration, lumbosacral region: Secondary | ICD-10-CM | POA: Diagnosis not present

## 2019-10-01 DIAGNOSIS — Z451 Encounter for adjustment and management of infusion pump: Secondary | ICD-10-CM | POA: Diagnosis not present

## 2019-10-01 DIAGNOSIS — Z882 Allergy status to sulfonamides status: Secondary | ICD-10-CM | POA: Insufficient documentation

## 2019-10-01 DIAGNOSIS — G8929 Other chronic pain: Secondary | ICD-10-CM

## 2019-10-01 DIAGNOSIS — Z9841 Cataract extraction status, right eye: Secondary | ICD-10-CM | POA: Insufficient documentation

## 2019-10-01 DIAGNOSIS — M545 Low back pain: Secondary | ICD-10-CM | POA: Diagnosis not present

## 2019-10-01 DIAGNOSIS — M47816 Spondylosis without myelopathy or radiculopathy, lumbar region: Secondary | ICD-10-CM

## 2019-10-01 DIAGNOSIS — Z7982 Long term (current) use of aspirin: Secondary | ICD-10-CM | POA: Insufficient documentation

## 2019-10-01 DIAGNOSIS — Z961 Presence of intraocular lens: Secondary | ICD-10-CM | POA: Insufficient documentation

## 2019-10-01 DIAGNOSIS — G894 Chronic pain syndrome: Secondary | ICD-10-CM

## 2019-10-01 DIAGNOSIS — M51379 Other intervertebral disc degeneration, lumbosacral region without mention of lumbar back pain or lower extremity pain: Secondary | ICD-10-CM

## 2019-10-01 DIAGNOSIS — Z978 Presence of other specified devices: Secondary | ICD-10-CM

## 2019-10-01 NOTE — Progress Notes (Signed)
PROVIDER NOTE: Information contained herein reflects review and annotations entered in association with encounter. Interpretation of such information and data should be left to medically-trained personnel. Information provided to patient can be located elsewhere in the medical record under "Patient Instructions". Document created using STT-dictation technology, any transcriptional errors that may result from process are unintentional.    Patient: Tiffany Hunter  Service Category: Procedure  Provider: Gaspar Cola, MD  DOB: 1945/10/03  DOS: 10/01/2019  Location: [facility]  MRN: [id number]  Setting: Ambulatory - outpatient  Referring Provider: Iva Lento, PA-C  Type: Established Patient  Specialty: Interventional Pain Management  PCP: Iva Lento, PA-C   Primary Reason for Visit: Interventional Pain Management Treatment. CC: Back Pain  Procedure:          Intrathecal Drug Delivery System (IDDS):  Type: Reservoir Refill [phone]) No rate change Region: Abdominal Laterality: Left  Type of Pump: Medtronic Synchromed II Delivery Route: Intrathecal Type of Pain Treated: Neuropathic/Nociceptive Primary Medication Class: Opioid/opiate  Medication, Concentration, Infusion Program, & Delivery Rate: Please see scanned programming printout.   Indications: 1. Chronic pain syndrome   2. DDD (degenerative disc disease), lumbosacral   3. Chronic low back pain (Primary Area of Pain) (Left)   4. Chronic lower extremity pain (Secondary Area of pain) (Left)   5. 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 Lumbar facet joint syndrome (Left)   8. Lumbar spondylosis   9. 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 Encounter for adjustment or management of infusion pump   11. Long term current use of opiate analgesic    Pain Assessment: Self-Reported Pain Score: 8 /10              Reported level is compatible with observation.        Pharmacotherapy Assessment  Analgesic: No other oral opioid analgesics prescribed by our practice. Intrathecal PF-Hydromorphone 3.9295 mg/day + PF-Sufentanil 34.383 mcg/day. MME: Aprox. 58.7 mg/day.   Monitoring: [place] PMP: PDMP reviewed during this encounter.       Pharmacotherapy: No side-effects or adverse reactions reported. Compliance: No problems identified. Effectiveness: Clinically acceptable. Plan: Refer to "POC".  UDS:  Summary  Date Value Ref Range Status  07/30/2019 Note  Final    Comment:    ==================================================================== Compliance Drug Analysis, Ur ==================================================================== Test                             Result       Flag       Units Drug Present not Declared for Prescription Verification   Hydromorphone                  1757         UNEXPECTED ng/mg creat    Hydromorphone may be administered as a scheduled prescription    medication; it is also an expected metabolite of hydrocodone.   Naproxen                       PRESENT      UNEXPECTED Drug Absent but Declared for Prescription Verification   Salicylate                     Not Detected UNEXPECTED    Aspirin, as indicated in the declared medication list, is not always  detected even when used as directed. ==================================================================== Test                      Result    Flag   Units      Ref Range   Creatinine              182              mg/dL      >=20 ==================================================================== Declared Medications:  The flagging and interpretation on this report are based on the  following declared medications.  Unexpected results may arise from  inaccuracies in the declared medications.  **Note: The testing scope of this panel does not include small to  moderate amounts of these reported medications:   Aspirin  **Note: The testing scope of this panel does not include the  following reported medications:  Albuterol (Ventolin HFA)  Atorvastatin (Lipitor)  Betamethasone (Lotrisone)  Carvedilol (Coreg)  Cholecalciferol  Clotrimazole (Lotrisone)  Hydrocortisone  Mirabegron (Myrbetriq)  Naloxegol (Movantik)  Oxybutynin (Ditropan)  Supplement ==================================================================== For clinical consultation, please call [phone]. ====================================================================    Intrathecal Pump Therapy Assessment  Manufacturer: Medtronic Synchromed Type: Programmable Volume: 40 mL reservoir MRI compatibility: Yes   Drug content:  Primary Medication Class:Opioid Primary Medication:PF-Hydromorphone (Dilaudid)(3m/mL) Secondary Medication:PF-Sufentanil(70 g/mL) Other Medication:No third medication   Programming:  Type: Simple continuous. See pump readout for details.   Changes:  Medication Change: None at this point Rate Change: No change in rate  Reported side-effects or adverse reactions: None reported  Effectiveness: Described as relatively effective, allowing for increase in activities of daily living (ADL) Clinically meaningful improvement in function (CMIF): Sustained CMIF goals met  Plan: Pump refill today  Pre-op Assessment:  [name] a 74y.o. (year old), female patient, seen today for interventional treatment. She  has a past surgical history that includes Carpal tunnel release (Bilateral); Back surgery; Breast surgery (Bilateral); Cholecystectomy; Colonoscopy w/ polypectomy; Cataract extraction w/ intraocular lens implant (Right); Abdominal hysterectomy; Fracture surgery (Right); Cardiac catheterization (2012); and Pain pump revision (Left, 07/07/2017). [name] a current medication list which includes the following prescription(s): aspirin ec, atorvastatin, carvedilol, cholecalciferol,  clotrimazole-betamethasone, hydrocortisone, mirabegron er, naloxegol oxalate, oxybutynin, albuterol, PAIN MANAGEMENT IT PUMP REFILL, and benefiber. Her primarily concern today is the Back Pain  Initial Vital Signs:  Pulse/HCG Rate: 71  Temp: (!) 97.2 F (36.2 C) Resp:   BP: 119/87 SpO2: 97 %  BMI: Estimated body mass index is 29.18 kg/m as calculated from the following:   Height as of this encounter: _0  (1.626 m).   Weight as of this encounter: 170 lb (77.1 kg).  Risk Assessment: Allergies: Reviewed. She is allergic to doxycycline hyclate and sulfa antibiotics.  Allergy Precautions: None required Coagulopathies: Reviewed. None identified.  Blood-thinner therapy: None at this time Active Infection(s): Reviewed. None identified. Ms. MRuferis afebrile  Site Confirmation: Ms. MGoodlinwas asked to confirm the procedure and laterality before marking the site Procedure checklist: Completed Consent: Before the procedure and under the influence of no sedative(s), amnesic(s), or anxiolytics, the patient was informed of the treatment options, risks and possible complications. To fulfill our ethical and legal obligations, as recommended by the American Medical Association's Code of Ethics, I have informed the patient of my clinical impression; the nature and purpose of the treatment or procedure; the risks, benefits, and possible complications of the intervention; the alternatives, including doing nothing; the risk(s) and benefit(s) of the alternative treatment(s) or  procedure(s); and the risk(s) and benefit(s) of doing nothing.  Tiffany Hunter was provided with information about the general risks and possible complications associated with most interventional procedures. These include, but are not limited to: failure to achieve desired goals, infection, bleeding, organ or nerve damage, allergic reactions, paralysis, and/or death.  In addition, she was informed of those risks and possible  complications associated to this particular procedure, which include, but are not limited to: damage to the implant; failure to decrease pain; local, systemic, or serious CNS infections, intraspinal abscess with possible cord compression and paralysis, or life-threatening such as meningitis; bleeding; organ damage; nerve injury or damage with subsequent sensory, motor, and/or autonomic system dysfunction, resulting in transient or permanent pain, numbness, and/or weakness of one or several areas of the body; allergic reactions, either minor or major life-threatening, such as anaphylactic or anaphylactoid reactions.  Furthermore, Tiffany Hunter was informed of those risks and complications associated with the medications. These include, but are not limited to: allergic reactions (i.e.: anaphylactic or anaphylactoid reactions); endorphine suppression; bradycardia and/or hypotension; water retention and/or peripheral vascular relaxation leading to lower extremity edema and possible stasis ulcers; respiratory depression and/or shortness of breath; decreased metabolic rate leading to weight gain; swelling or edema; medication-induced neural toxicity; particulate matter embolism and blood vessel occlusion with resultant organ, and/or nervous system infarction; and/or intrathecal granuloma formation with possible spinal cord compression and permanent paralysis.  Before refilling the pump Tiffany Hunter was informed that some of the medications used in the devise may not be FDA approved for such use and therefore it constitutes an off-label use of the medications.  Finally, she was informed that Medicine is not an exact science; therefore, there is also the possibility of unforeseen or unpredictable risks and/or possible complications that may result in a catastrophic outcome. The patient indicated having understood very clearly. We have given the patient no guarantees and we have made no promises. Enough time was given  to the patient to ask questions, all of which were answered to the patient's satisfaction. Tiffany Hunter has indicated that she wanted to continue with the procedure. Attestation: I, the ordering provider, attest that I have discussed with the patient the benefits, risks, side-effects, alternatives, likelihood of achieving goals, and potential problems during recovery for the procedure that I have provided informed consent. Date  Time: 10/01/2019 10:31 AM  Pre-Procedure Preparation:  Monitoring: As per clinic protocol. Respiration, ETCO2, SpO2, BP, heart rate and rhythm monitor placed and checked for adequate function Safety Precautions: Patient was assessed for positional comfort and pressure points before starting the procedure. Time-out: I initiated and conducted the "Time-out" before starting the procedure, as per protocol. The patient was asked to participate by confirming the accuracy of the "Time Out" information. Verification of the correct person, site, and procedure were performed and confirmed by me, the nursing staff, and the patient. "Time-out" conducted as per Joint Commission's Universal Protocol (UP.01.01.01). Time: 1106  Description of Procedure:          Position: Supine Target Area: Central-port of intrathecal pump. Approach: Anterior, 90 degree angle approach. Area Prepped: Entire Area around the pump implant. DuraPrep (Iodine Povacrylex [0.7% available iodine] and Isopropyl Alcohol, 74% w/w) Safety Precautions: Aspiration looking for blood return was conducted prior to all injections. At no point did we inject any substances, as a needle was being advanced. No attempts were made at seeking any paresthesias. Safe injection practices and needle disposal techniques used. Medications properly checked for expiration dates. SDV (  single dose vial) medications used. Description of the Procedure: Protocol guidelines were followed. Two nurses trained to do implant refills were present  during the entire procedure. The refill medication was checked by both healthcare providers as well as the patient. The patient was included in the "Time-out" to verify the medication. The patient was placed in position. The pump was identified. The area was prepped in the usual manner. The sterile template was positioned over the pump, making sure the side-port location matched that of the pump. Both, the pump and the template were held for stability. The needle provided in the Medtronic Kit was then introduced thru the center of the template and into the central port. The pump content was aspirated and discarded volume documented. The new medication was slowly infused into the pump, thru the filter, making sure to avoid overpressure of the device. The needle was then removed and the area cleansed, making sure to leave some of the prepping solution back to take advantage of its long term bactericidal properties. The pump was interrogated and programmed to reflect the correct medication, volume, and dosage. The program was printed and taken to the physician for approval. Once checked and signed by the physician, a copy was provided to the patient and another scanned into the EMR. Vitals:   10/01/19 1031  BP: 119/87  Pulse: 71  Temp: (!) 97.2 F (36.2 C)  SpO2: 97%  Weight: 170 lb (77.1 kg)  Height: _0  (1.626 m)    Start Time: 1106 hrs. End Time: 1120 hrs. Materials & Medications: Medtronic Refill Kit Medication(s): Please see chart orders for details.  Imaging Guidance:          Type of Imaging Technique: None used Indication(s): N/A Exposure Time: No patient exposure Contrast: None used. Fluoroscopic Guidance: N/A Ultrasound Guidance: N/A Interpretation: N/A  Antibiotic Prophylaxis:   Anti-infectives (From admission, onward)   None     Indication(s): None identified  Post-operative Assessment:  Post-procedure Vital Signs:  Pulse/HCG Rate: 71  Temp: (!) 97.2 F (36.2 C) Resp:    BP: 119/87 SpO2: 97 %  EBL: None  Complications: No immediate post-treatment complications observed by team, or reported by patient.  Note: The patient tolerated the entire procedure well. A repeat set of vitals were taken after the procedure and the patient was kept under observation following institutional policy, for this type of procedure. Post-procedural neurological assessment was performed, showing return to baseline, prior to discharge. The patient was provided with post-procedure discharge instructions, including a section on how to identify potential problems. Should any problems arise concerning this procedure, the patient was given instructions to immediately contact us, at any time, without hesitation. In any case, we plan to contact the patient by telephone for a follow-up status report regarding this interventional procedure.  Comments:  No additional relevant information.  Plan of Care  Orders:  Orders Placed This Encounter  Procedures  . PUMP REFILL    Maintain Protocol by having two(2) healthcare providers during procedure and programming.    Scheduling Instructions:     Please refill intrathecal pump today.    Order Specific Question:   Where will this procedure be performed?    Answer:   [facility]  . PUMP REFILL    Whenever possible schedule on a procedure today.    Standing Status:   Future    Standing Expiration Date:   02/28/2020    Scheduling Instructions:     Please schedule intrathecal pump refill  based on pump programming. Avoid schedule intervals of more than 120 days (4 months).    Order Specific Question:   Where will this procedure be performed?    Answer:   [facility]  . Informed Consent Details: Physician/Practitioner Attestation; Transcribe to consent form and obtain patient signature    Provider Attestation: I, [place] Dossie Arbour, MD, (Pain Management Specialist), the physician/practitioner, attest that I have discussed with  the patient the benefits, risks, side effects, alternatives, likelihood of achieving goals and potential problems during recovery for the procedure that I have provided informed consent.    Scheduling Instructions:     Procedure: Intrathecal Pump Refill     Attending Physician: Beatriz Chancellor A. Dossie Arbour, MD     Indications: Chronic Pain Syndrome (G89.4)     Transcribe to consent form and obtain patient signature.   Chronic Opioid Analgesic:  No other oral opioid analgesics prescribed by our practice. Intrathecal PF-Hydromorphone 3.9295 mg/day + PF-Sufentanil 34.383 mcg/day. MME: Aprox. 58.7 mg/day.   Medications ordered for procedure: No orders of the defined types were placed in this encounter.  Medications administered: Tiffany Hunter had no medications administered during this visit.  See the medical record for exact dosing, route, and time of administration.  Follow-up plan:   Return for Pump Refill (Max:78mo.       Interventional management options: Planned, scheduled, and/or pending:   Not at this time.   Considering:   Diagnostic left sided lumbar facet block #2 Possible left-sided lumbar facet RFA   Palliative PRN treatment(s):   Palliative Intrathecal pump refillas per programming  Diagnostic left sided lumbar facet block #2        Recent Visits Date Type Provider Dept  07/30/19 Procedure visit NMilinda Pointer MD [facility]  Showing recent visits within past 90 days and meeting all other requirements   Today's Visits Date Type Provider Dept  10/01/19 Procedure visit NMilinda Pointer MD [facility]  Showing today's visits and meeting all other requirements   Future Appointments No visits were found meeting these conditions.  Showing future appointments within next 90 days and meeting all other requirements   Disposition: Discharge home  Discharge (Date  Time): 10/01/2019; 1130 hrs.   Primary Care Physician: WIva Lento  PA-C [facility] Note by: FGaspar Cola MD Date: 10/01/2019; Time: 11:29 AM  Disclaimer:  Medicine is not an eChief Strategy Officer The only guarantee in medicine is that nothing is guaranteed. It is important to note that the decision to proceed with this intervention was based on the information collected from the patient. The Data and conclusions were drawn from the patient's questionnaire, the interview, and the physical examination. Because the information was provided in large part by the patient, it cannot be guaranteed that it has not been purposely or unconsciously manipulated. Every effort has been made to obtain as much relevant data as possible for this evaluation. It is important to note that the conclusions that lead to this procedure are derived in large part from the available data. Always take into account that the treatment will also be dependent on availability of resources and existing treatment guidelines, considered by other Pain Management Practitioners as being common knowledge and practice, at the time of the intervention. For Medico-Legal purposes, it is also important to point out that variation in procedural techniques and pharmacological choices are the acceptable norm. The indications, contraindications, technique, and results of the above procedure should only be interpreted and judged  by a Board-Certified Interventional Pain Specialist with extensive familiarity and expertise in the same exact procedure and technique.

## 2019-10-02 ENCOUNTER — Telehealth: Payer: Self-pay

## 2019-10-02 NOTE — Telephone Encounter (Signed)
Post intrathecal pump refill phone call.  Patient states she is doing fine.

## 2019-10-03 MED FILL — Medication: INTRATHECAL | Qty: 1 | Status: AC

## 2019-11-11 ENCOUNTER — Other Ambulatory Visit: Payer: Self-pay

## 2019-11-11 MED ORDER — PAIN MANAGEMENT IT PUMP REFILL: 1.0000 | Freq: Once | INTRATHECAL | 0 refills | Status: AC

## 2019-12-10 ENCOUNTER — Encounter: Payer: Self-pay | Admitting: Pain Medicine

## 2019-12-10 ENCOUNTER — Other Ambulatory Visit: Payer: Self-pay

## 2019-12-10 ENCOUNTER — Ambulatory Visit: Payer: Medicare Other | Attending: Pain Medicine | Admitting: Pain Medicine

## 2019-12-10 VITALS — BP 121/71 | HR 65 | Temp 97.0°F | Resp 18 | Ht 62.0 in | Wt 170.0 lb

## 2019-12-10 DIAGNOSIS — G8929 Other chronic pain: Secondary | ICD-10-CM

## 2019-12-10 DIAGNOSIS — Z79891 Long term (current) use of opiate analgesic: Secondary | ICD-10-CM | POA: Diagnosis not present

## 2019-12-10 DIAGNOSIS — Z451 Encounter for adjustment and management of infusion pump: Secondary | ICD-10-CM | POA: Diagnosis not present

## 2019-12-10 DIAGNOSIS — M5442 Lumbago with sciatica, left side: Secondary | ICD-10-CM | POA: Diagnosis not present

## 2019-12-10 DIAGNOSIS — M4726 Other spondylosis with radiculopathy, lumbar region: Secondary | ICD-10-CM | POA: Diagnosis not present

## 2019-12-10 DIAGNOSIS — Z881 Allergy status to other antibiotic agents status: Secondary | ICD-10-CM | POA: Insufficient documentation

## 2019-12-10 DIAGNOSIS — G894 Chronic pain syndrome: Secondary | ICD-10-CM | POA: Diagnosis present

## 2019-12-10 DIAGNOSIS — Z978 Presence of other specified devices: Secondary | ICD-10-CM | POA: Diagnosis not present

## 2019-12-10 DIAGNOSIS — Z882 Allergy status to sulfonamides status: Secondary | ICD-10-CM | POA: Diagnosis not present

## 2019-12-10 DIAGNOSIS — M51379 Other intervertebral disc degeneration, lumbosacral region without mention of lumbar back pain or lower extremity pain: Secondary | ICD-10-CM

## 2019-12-10 DIAGNOSIS — M961 Postlaminectomy syndrome, not elsewhere classified: Secondary | ICD-10-CM

## 2019-12-10 DIAGNOSIS — M47816 Spondylosis without myelopathy or radiculopathy, lumbar region: Secondary | ICD-10-CM

## 2019-12-10 DIAGNOSIS — M5137 Other intervertebral disc degeneration, lumbosacral region: Secondary | ICD-10-CM | POA: Insufficient documentation

## 2019-12-10 NOTE — Progress Notes (Addendum)
PROVIDER NOTE: Information contained herein reflects review and annotations entered in association with encounter. Interpretation of such information and data should be left to medically-trained personnel. Information provided to patient can be located elsewhere in the medical record under "Patient Instructions". Document created using STT-dictation technology, any transcriptional errors that may result from process are unintentional.    Patient: Tiffany Hunter  Service Category: Procedure  Provider: Gaspar Cola, MD  DOB: 07/06/1945  DOS: 12/10/2019  Location: [facility]  MRN: [id number]  Setting: Ambulatory - outpatient  Referring Provider: Iva Lento, PA-C  Type: Established Patient  Specialty: Interventional Pain Management  PCP: Iva Lento, PA-C   Primary Reason for Visit: Interventional Pain Management Treatment. CC: Back Pain (low left)  Procedure:          Intrathecal Drug Delivery System (IDDS):  Type: Reservoir Refill [phone]) No rate change Region: Abdominal Laterality: Left  Type of Pump: Medtronic Synchromed II Delivery Route: Intrathecal Type of Pain Treated: Neuropathic/Nociceptive Primary Medication Class: Opioid/opiate  Medication, Concentration, Infusion Program, & Delivery Rate: Please see scanned programming printout.   Indications: 1. Chronic pain syndrome   2. DDD (degenerative disc disease), lumbosacral   3. Chronic low back pain (Primary Area of Pain) (Left)   4. Chronic lower extremity pain (Secondary Area of pain) (Left)   5. 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 Lumbar facet joint syndrome (Left)   8. Lumbar spondylosis   9. 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 Encounter for adjustment or management of infusion pump   11. Long term current use of opiate analgesic    Pain Assessment: Self-Reported Pain Score:  10-Worst pain ever/10             Reported level is compatible with observation.        The patient refers recently having fallen and aggravated her left-sided low back pain.  In the past we had done a left-sided lumbar facet block with excellent results.  According to the patient the pain that she is experiencing right now is in the exact same position.  Today we talked to her about repeating that lumbar facet block and she was in agreement.  We will go ahead and put an order for that and bring her back as soon as possible for that palliative procedure.  In addition, the patient has indicated that meanwhile she has been taking some tramadol for this increased pain from the fall.  In checking the patient's PMP the last time that she had a prescription written for tramadol was approximately 1 year ago.  She is currently not had any recent prescriptions for it.  I wonder if it still the same prescription that she had written 1 year ago.  Pharmacotherapy Assessment  Analgesic: No other oral opioid analgesics prescribed by our practice. Intrathecal PF-Hydromorphone 3.9295 mg/day + PF-Sufentanil 34.383 mcg/day. MME: Aprox. 58.7 mg/day.   Monitoring: [place] PMP: PDMP reviewed during this encounter.       Pharmacotherapy: No side-effects or adverse reactions reported. Compliance: No problems identified. Effectiveness: Clinically acceptable. Plan: Refer to "POC".  UDS:  Summary  Date Value Ref Range Status  07/30/2019 Note  Final    Comment:    ==================================================================== Compliance Drug Analysis, Ur ==================================================================== Test  Result       Flag       Units Drug Present not Declared for Prescription Verification   Hydromorphone                  1757         UNEXPECTED ng/mg creat    Hydromorphone may be administered as a scheduled prescription    medication; it is also an expected  metabolite of hydrocodone.   Naproxen                       PRESENT      UNEXPECTED Drug Absent but Declared for Prescription Verification   Salicylate                     Not Detected UNEXPECTED    Aspirin, as indicated in the declared medication list, is not always    detected even when used as directed. ==================================================================== Test                      Result    Flag   Units      Ref Range   Creatinine              182              mg/dL      >=20 ==================================================================== Declared Medications:  The flagging and interpretation on this report are based on the  following declared medications.  Unexpected results may arise from  inaccuracies in the declared medications.  **Note: The testing scope of this panel does not include small to  moderate amounts of these reported medications:  Aspirin  **Note: The testing scope of this panel does not include the  following reported medications:  Albuterol (Ventolin HFA)  Atorvastatin (Lipitor)  Betamethasone (Lotrisone)  Carvedilol (Coreg)  Cholecalciferol  Clotrimazole (Lotrisone)  Hydrocortisone  Mirabegron (Myrbetriq)  Naloxegol (Movantik)  Oxybutynin (Ditropan)  Supplement ==================================================================== For clinical consultation, please call [phone]. ====================================================================     Intrathecal Pump Therapy Assessment  Manufacturer: Medtronic Synchromed Type: Programmable Volume: 40 mL reservoir MRI compatibility: Yes   Drug content:  Primary Medication Class:Opioid Primary Medication:PF-Hydromorphone (Dilaudid)(51m/mL) Secondary Medication:PF-Sufentanil(70 g/mL) Other Medication:No third medication   Programming:  Type: Simple continuous. See pump readout for details.   Changes:  Medication Change: None at this point Rate Change: No  change in rate  Reported side-effects or adverse reactions: None reported  Effectiveness: Described as relatively effective, allowing for increase in activities of daily living (ADL) Clinically meaningful improvement in function (CMIF): Sustained CMIF goals met  Plan: Pump refill today  Pre-op Assessment:  Ms. MDimiceliis a 74y.o. (year old), female patient, seen today for interventional treatment. She  has a past surgical history that includes Carpal tunnel release (Bilateral); Back surgery; Breast surgery (Bilateral); Cholecystectomy; Colonoscopy w/ polypectomy; Cataract extraction w/ intraocular lens implant (Right); Abdominal hysterectomy; Fracture surgery (Right); Cardiac catheterization (2012); and Pain pump revision (Left, 07/07/2017). Ms. MGolubskihas a current medication list which includes the following prescription(s): albuterol, aspirin ec, atorvastatin, carvedilol, meloxicam, naloxegol oxalate, tramadol, and benefiber. Her primarily concern today is the Back Pain (low left)  Initial Vital Signs:  Pulse/HCG Rate: 65  Temp: (!) 97 F (36.1 C) Resp: 18 BP: 121/71 SpO2: 98 %  BMI: Estimated body mass index is 31.09 kg/m as calculated from the following:   Height as of this encounter: '5\' 2"'  (1.575 m).  Weight as of this encounter: 170 lb (77.1 kg).  Risk Assessment: Allergies: Reviewed. She is allergic to doxycycline hyclate and sulfa antibiotics.  Allergy Precautions: None required Coagulopathies: Reviewed. None identified.  Blood-thinner therapy: None at this time Active Infection(s): Reviewed. None identified. Tiffany Hunter is afebrile  Site Confirmation: Tiffany Hunter was asked to confirm the procedure and laterality before marking the site Procedure checklist: Completed Consent: Before the procedure and under the influence of no sedative(s), amnesic(s), or anxiolytics, the patient was informed of the treatment options, risks and possible complications. To fulfill our  ethical and legal obligations, as recommended by the American Medical Association's Code of Ethics, I have informed the patient of my clinical impression; the nature and purpose of the treatment or procedure; the risks, benefits, and possible complications of the intervention; the alternatives, including doing nothing; the risk(s) and benefit(s) of the alternative treatment(s) or procedure(s); and the risk(s) and benefit(s) of doing nothing.  Tiffany Hunter was provided with information about the general risks and possible complications associated with most interventional procedures. These include, but are not limited to: failure to achieve desired goals, infection, bleeding, organ or nerve damage, allergic reactions, paralysis, and/or death.  In addition, she was informed of those risks and possible complications associated to this particular procedure, which include, but are not limited to: damage to the implant; failure to decrease pain; local, systemic, or serious CNS infections, intraspinal abscess with possible cord compression and paralysis, or life-threatening such as meningitis; bleeding; organ damage; nerve injury or damage with subsequent sensory, motor, and/or autonomic system dysfunction, resulting in transient or permanent pain, numbness, and/or weakness of one or several areas of the body; allergic reactions, either minor or major life-threatening, such as anaphylactic or anaphylactoid reactions.  Furthermore, Tiffany Hunter was informed of those risks and complications associated with the medications. These include, but are not limited to: allergic reactions (i.e.: anaphylactic or anaphylactoid reactions); endorphine suppression; bradycardia and/or hypotension; water retention and/or peripheral vascular relaxation leading to lower extremity edema and possible stasis ulcers; respiratory depression and/or shortness of breath; decreased metabolic rate leading to weight gain; swelling or edema;  medication-induced neural toxicity; particulate matter embolism and blood vessel occlusion with resultant organ, and/or nervous system infarction; and/or intrathecal granuloma formation with possible spinal cord compression and permanent paralysis.  Before refilling the pump Tiffany Hunter was informed that some of the medications used in the devise may not be FDA approved for such use and therefore it constitutes an off-label use of the medications.  Finally, she was informed that Medicine is not an exact science; therefore, there is also the possibility of unforeseen or unpredictable risks and/or possible complications that may result in a catastrophic outcome. The patient indicated having understood very clearly. We have given the patient no guarantees and we have made no promises. Enough time was given to the patient to ask questions, all of which were answered to the patient's satisfaction. Tiffany Hunter has indicated that she wanted to continue with the procedure. Attestation: I, the ordering provider, attest that I have discussed with the patient the benefits, risks, side-effects, alternatives, likelihood of achieving goals, and potential problems during recovery for the procedure that I have provided informed consent. Date  Time: 12/10/2019 10:45 AM  Pre-Procedure Preparation:  Monitoring: As per clinic protocol. Respiration, ETCO2, SpO2, BP, heart rate and rhythm monitor placed and checked for adequate function Safety Precautions: Patient was assessed for positional comfort and pressure points before starting the procedure. Time-out: I initiated and  conducted the "Time-out" before starting the procedure, as per protocol. The patient was asked to participate by confirming the accuracy of the "Time Out" information. Verification of the correct person, site, and procedure were performed and confirmed by me, the nursing staff, and the patient. "Time-out" conducted as per Joint Commission's Universal  Protocol (UP.01.01.01). Time: 1108  Description of Procedure:          Position: Supine Target Area: Central-port of intrathecal pump. Approach: Anterior, 90 degree angle approach. Area Prepped: Entire Area around the pump implant. DuraPrep (Iodine Povacrylex [0.7% available iodine] and Isopropyl Alcohol, 74% w/w) Safety Precautions: Aspiration looking for blood return was conducted prior to all injections. At no point did we inject any substances, as a needle was being advanced. No attempts were made at seeking any paresthesias. Safe injection practices and needle disposal techniques used. Medications properly checked for expiration dates. SDV (single dose vial) medications used. Description of the Procedure: Protocol guidelines were followed. Two nurses trained to do implant refills were present during the entire procedure. The refill medication was checked by both healthcare providers as well as the patient. The patient was included in the "Time-out" to verify the medication. The patient was placed in position. The pump was identified. The area was prepped in the usual manner. The sterile template was positioned over the pump, making sure the side-port location matched that of the pump. Both, the pump and the template were held for stability. The needle provided in the Medtronic Kit was then introduced thru the center of the template and into the central port. The pump content was aspirated and discarded volume documented. The new medication was slowly infused into the pump, thru the filter, making sure to avoid overpressure of the device. The needle was then removed and the area cleansed, making sure to leave some of the prepping solution back to take advantage of its long term bactericidal properties. The pump was interrogated and programmed to reflect the correct medication, volume, and dosage. The program was printed and taken to the physician for approval. Once checked and signed by the physician,  a copy was provided to the patient and another scanned into the EMR. Vitals:   12/10/19 1043  BP: 121/71  Pulse: 65  Resp: 18  Temp: (!) 97 F (36.1 C)  SpO2: 98%  Weight: 170 lb (77.1 kg)  Height: '5\' 2"'  (1.575 m)    Start Time: 1108 hrs. End Time: 1117 hrs. Materials & Medications: Medtronic Refill Kit Medication(s): Please see chart orders for details.  Imaging Guidance:          Type of Imaging Technique: None used Indication(s): N/A Exposure Time: No patient exposure Contrast: None used. Fluoroscopic Guidance: N/A Ultrasound Guidance: N/A Interpretation: N/A  Antibiotic Prophylaxis:   Anti-infectives (From admission, onward)   None     Indication(s): None identified  Post-operative Assessment:  Post-procedure Vital Signs:  Pulse/HCG Rate: 65  Temp: (!) 97 F (36.1 C) Resp: 18 BP: 121/71 SpO2: 98 %  EBL: None  Complications: No immediate post-treatment complications observed by team, or reported by patient.  Note: The patient tolerated the entire procedure well. A repeat set of vitals were taken after the procedure and the patient was kept under observation following institutional policy, for this type of procedure. Post-procedural neurological assessment was performed, showing return to baseline, prior to discharge. The patient was provided with post-procedure discharge instructions, including a section on how to identify potential problems. Should any problems arise concerning this procedure,  the patient was given instructions to immediately contact us, at any time, without hesitation. In any case, we plan to contact the patient by telephone for a follow-up status report regarding this interventional procedure.  Comments:  No additional relevant information.  Plan of Care  Orders:  Orders Placed This Encounter  Procedures  . PUMP REFILL    Maintain Protocol by having two(2) healthcare providers during procedure and programming.    Scheduling Instructions:      Please refill intrathecal pump today.    Order Specific Question:   Where will this procedure be performed?    Answer:   [facility]  . PUMP REFILL    Whenever possible schedule on a procedure today.    Standing Status:   Future    Standing Expiration Date:   05/08/2020    Scheduling Instructions:     Please schedule intrathecal pump refill based on pump programming. Avoid schedule intervals of more than 120 days (4 months).    Order Specific Question:   Where will this procedure be performed?    Answer:   [facility]  . LUMBAR FACET(MEDIAL BRANCH NERVE BLOCK) MBNB    Standing Status:   Future    Standing Expiration Date:   01/10/2020    Scheduling Instructions:     Procedure: Lumbar facet block (AKA.: Lumbosacral medial branch nerve block)     Side: Left-sided     Level: L3-4, L4-5, & L5-S1 Facets (L2, L3, L4, L5, & S1 Medial Branch Nerves)     Sedation: Patient's choice.     Timeframe: ASAA    Order Specific Question:   Where will this procedure be performed?    Answer:   [facility]  . Informed Consent Details: Physician/Practitioner Attestation; Transcribe to consent form and obtain patient signature    Provider Attestation: I, [place] Dossie Arbour, MD, (Pain Management Specialist), the physician/practitioner, attest that I have discussed with the patient the benefits, risks, side effects, alternatives, likelihood of achieving goals and potential problems during recovery for the procedure that I have provided informed consent.    Scheduling Instructions:     Procedure: Intrathecal Pump Refill     Attending Physician: Beatriz Chancellor A. Dossie Arbour, MD     Indications: Chronic Pain Syndrome (G89.4)     Transcribe to consent form and obtain patient signature.   Chronic Opioid Analgesic:  No other oral opioid analgesics prescribed by our practice. Intrathecal PF-Hydromorphone 3.9295 mg/day + PF-Sufentanil 34.383 mcg/day. MME: Aprox. 58.7 mg/day.   Medications  ordered for procedure: No orders of the defined types were placed in this encounter.  Medications administered: Tiffany Hunter had no medications administered during this visit.  See the medical record for exact dosing, route, and time of administration.  Follow-up plan:   Return for Pump Refill (Max:36mo, in addition, Procedure (w/ sedation): (L) L-FCT Blk #2.       Interventional management options: Planned, scheduled, and/or pending:   Not at this time.   Considering:   Diagnostic left sided lumbar facet block #2 Possible left-sided lumbar facet RFA   Palliative PRN treatment(s):   Palliative Intrathecal pump refillas per programming  Diagnostic left sided lumbar facet block #2         Recent Visits Date Type Provider Dept  10/01/19 Procedure visit NMilinda Pointer MD [facility]  Showing recent visits within past 90 days and meeting all other requirements Today's Visits Date Type Provider Dept  12/10/19 Procedure visit NMilinda Pointer MD [facility]  Showing today's visits and meeting all other requirements Future Appointments Date Type Provider Dept  02/18/20 Appointment Milinda Pointer, MD [facility]  Showing future appointments within next 90 days and meeting all other requirements  Disposition: Discharge home  Discharge (Date  Time): 12/10/2019;   hrs.   Primary Care Physician: Iva Lento, PA-C Location: [facility] Note by: Gaspar Cola, MD Date: 12/10/2019; Time: 12:13 PM  Disclaimer:  Medicine is not an Chief Strategy Officer. The only guarantee in medicine is that nothing is guaranteed. It is important to note that the decision to proceed with this intervention was based on the information collected from the patient. The Data and conclusions were drawn from the patient's questionnaire, the interview, and the physical examination. Because the information was provided in large part by  the patient, it cannot be guaranteed that it has not been purposely or unconsciously manipulated. Every effort has been made to obtain as much relevant data as possible for this evaluation. It is important to note that the conclusions that lead to this procedure are derived in large part from the available data. Always take into account that the treatment will also be dependent on availability of resources and existing treatment guidelines, considered by other Pain Management Practitioners as being common knowledge and practice, at the time of the intervention. For Medico-Legal purposes, it is also important to point out that variation in procedural techniques and pharmacological choices are the acceptable norm. The indications, contraindications, technique, and results of the above procedure should only be interpreted and judged by a Board-Certified Interventional Pain Specialist with extensive familiarity and expertise in the same exact procedure and technique.

## 2019-12-10 NOTE — Patient Instructions (Addendum)

## 2019-12-10 NOTE — Progress Notes (Signed)
Safety precautions to be maintained throughout the outpatient stay will include: orient to surroundings, keep bed in low position, maintain call bell within reach at all times, provide assistance with transfer out of bed and ambulation.  

## 2019-12-10 NOTE — Addendum Note (Signed)
Addended by: Milinda Pointer A on: 12/10/2019 12:13 PM   Modules accepted: Orders

## 2019-12-11 ENCOUNTER — Telehealth: Payer: Self-pay | Admitting: *Deleted

## 2019-12-11 MED FILL — Medication: INTRATHECAL | Qty: 1 | Status: AC

## 2019-12-11 NOTE — Telephone Encounter (Signed)
Attempted to call for post pump fill follow-up. Message left.

## 2020-01-27 ENCOUNTER — Other Ambulatory Visit: Payer: Self-pay

## 2020-01-27 MED ORDER — PAIN MANAGEMENT IT PUMP REFILL: 1.0000 | Freq: Once | INTRATHECAL | 0 refills | Status: AC

## 2020-02-18 ENCOUNTER — Encounter: Payer: Medicare Other | Admitting: Pain Medicine

## 2020-02-18 NOTE — Progress Notes (Signed)
PROVIDER NOTE: Information contained herein reflects review and annotations entered in association with encounter. Interpretation of such information and data should be left to medically-trained personnel. Information provided to patient can be located elsewhere in the medical record under "Patient Instructions". Document created using STT-dictation technology, any transcriptional errors that may result from process are unintentional.    Patient: Tiffany Hunter  Service Category: Procedure  Provider: Gaspar Cola, MD  DOB: Feb 15, 1946  DOS: 02/19/2020  Location: [facility]  MRN: [id number]  Setting: Ambulatory - outpatient  Referring Provider: Iva Lento, PA-C  Type: Established Patient  Specialty: Interventional Pain Management  PCP: Tiffany Lento, PA-C   Primary Reason for Visit: Interventional Pain Management Treatment. CC: Back Pain (low left)  Procedure:          Intrathecal Drug Delivery System (IDDS):  Type: Reservoir Refill [phone]) No rate change Region: Abdominal Laterality: Left  Type of Pump: Medtronic Synchromed II Delivery Route: Intrathecal Type of Pain Treated: Neuropathic/Nociceptive Primary Medication Class: Opioid/opiate  Medication, Concentration, Infusion Program, & Delivery Rate: Please see scanned programming printout.   Indications: 1. Chronic pain syndrome   2. Chronic low back pain (Primary Area of Pain) (Left)   3. 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 Encounter for adjustment or management of infusion pump    Pain Assessment: Self-Reported Pain Score: 8 /10             Reported level is compatible with observation.       Tiffany Hunter was scheduled to come in yesterday for her pump refill, but she did not show up to that appointment.  She comes in today to have the pump  refilled before the medication expires.  Pharmacotherapy Assessment  Analgesic: No other oral opioid analgesics prescribed by our practice. Intrathecal PF-Hydromorphone 3.9295 mg/day + PF-Sufentanil 34.383 mcg/day. MME: Aprox. 58.7 mg/day.   Monitoring: Tiffany Hunter PMP: PDMP reviewed during this encounter.       Pharmacotherapy: No side-effects or adverse reactions reported. Compliance: No problems identified. Effectiveness: Clinically acceptable. Plan: Refer to "POC".  UDS:  Summary  Date Value Ref Range Status  07/30/2019 Note  Final    Comment:    ==================================================================== Compliance Drug Analysis, Ur ==================================================================== Test                             Result       Flag       Units Drug Present not Declared for Prescription Verification   Hydromorphone                  1757         UNEXPECTED ng/mg creat    Hydromorphone may be administered as a scheduled prescription    medication; it is also an expected metabolite of hydrocodone.   Naproxen                       PRESENT      UNEXPECTED Drug Absent but Declared for Prescription Verification   Salicylate                     Not Detected UNEXPECTED    Aspirin, as indicated in the declared medication list, is not always    detected even when used  as directed. ==================================================================== Test                      Result    Flag   Units      Ref Range   Creatinine              182              mg/dL      >=20 ==================================================================== Declared Medications:  The flagging and interpretation on this report are based on the  following declared medications.  Unexpected results may arise from  inaccuracies in the declared medications.  **Note: The testing scope of this panel does not include small to  moderate amounts of these reported medications:  Aspirin  **Note:  The testing scope of this panel does not include the  following reported medications:  Albuterol (Ventolin HFA)  Atorvastatin (Lipitor)  Betamethasone (Lotrisone)  Carvedilol (Coreg)  Cholecalciferol  Clotrimazole (Lotrisone)  Hydrocortisone  Mirabegron (Myrbetriq)  Naloxegol (Movantik)  Oxybutynin (Ditropan)  Supplement ==================================================================== For clinical consultation, please call [phone]. ====================================================================     Intrathecal Pump Therapy Assessment  Manufacturer: Medtronic Synchromed Type: Programmable Volume: 40 mL reservoir MRI compatibility: Yes   Drug content:  Primary Medication Class:Opioid Primary Medication:PF-Hydromorphone (Dilaudid)(70m/mL) Secondary Medication:PF-Sufentanil(70 g/mL) Other Medication:No third medication   Programming:  Type: Simple continuous. See pump readout for details.   Changes:  Medication Change: None at this point Rate Change: No change in rate  Reported side-effects or adverse reactions: None reported  Effectiveness: Described as relatively effective, allowing for increase in activities of daily living (ADL) Clinically meaningful improvement in function (CMIF): Sustained CMIF goals met  Plan: Pump refill today  Pre-op Assessment:  [name] a 74y.o. (year old), female patient, seen today for interventional treatment. She  has a past surgical history that includes Carpal tunnel release (Bilateral); Back surgery; Breast surgery (Bilateral); Cholecystectomy; Colonoscopy w/ polypectomy; Cataract extraction w/ intraocular lens implant (Right); Abdominal hysterectomy; Fracture surgery (Right); Cardiac catheterization (2012); and Pain pump revision (Left, 07/07/2017). [name] a current medication list which includes the following prescription(s): albuterol, aspirin ec, atorvastatin, carvedilol, meloxicam, naloxegol  oxalate, tramadol, and benefiber. Her primarily concern today is the Back Pain (low left)  Initial Vital Signs:  Pulse/HCG Rate: 69  Temp: 98.3 F (36.8 C) Resp: 18 BP: 137/77 SpO2: 95 %  BMI: Estimated body mass index is 31.09 kg/m as calculated from the following:   Height as of this encounter: _0  (1.575 m).   Weight as of this encounter: 170 lb (77.1 kg).  Risk Assessment: Allergies: Reviewed. She is allergic to doxycycline hyclate and sulfa antibiotics.  Allergy Precautions: None required Coagulopathies: Reviewed. None identified.  Blood-thinner therapy: None at this time Active Infection(s): Reviewed. None identified. Ms. MIngmanis afebrile  Site Confirmation: Ms. MCremeenswas asked to confirm the procedure and laterality before marking the site Procedure checklist: Completed Consent: Before the procedure and under the influence of no sedative(s), amnesic(s), or anxiolytics, the patient was informed of the treatment options, risks and possible complications. To fulfill our ethical and legal obligations, as recommended by the American Medical Association's Code of Ethics, I have informed the patient of my clinical impression; the nature and purpose of the treatment or procedure; the risks, benefits, and possible complications of the intervention; the alternatives, including doing nothing; the risk(s) and benefit(s) of the alternative treatment(s) or procedure(s); and the risk(s) and benefit(s) of doing nothing.  Ms. MTappen  was provided with information about the general risks and possible complications associated with most interventional procedures. These include, but are not limited to: failure to achieve desired goals, infection, bleeding, organ or nerve damage, allergic reactions, paralysis, and/or death.  In addition, she was informed of those risks and possible complications associated to this particular procedure, which include, but are not limited to: damage to the  implant; failure to decrease pain; local, systemic, or serious CNS infections, intraspinal abscess with possible cord compression and paralysis, or life-threatening such as meningitis; bleeding; organ damage; nerve injury or damage with subsequent sensory, motor, and/or autonomic system dysfunction, resulting in transient or permanent pain, numbness, and/or weakness of one or several areas of the body; allergic reactions, either minor or major life-threatening, such as anaphylactic or anaphylactoid reactions.  Furthermore, Ms. Reczek was informed of those risks and complications associated with the medications. These include, but are not limited to: allergic reactions (i.e.: anaphylactic or anaphylactoid reactions); endorphine suppression; bradycardia and/or hypotension; water retention and/or peripheral vascular relaxation leading to lower extremity edema and possible stasis ulcers; respiratory depression and/or shortness of breath; decreased metabolic rate leading to weight gain; swelling or edema; medication-induced neural toxicity; particulate matter embolism and blood vessel occlusion with resultant organ, and/or nervous system infarction; and/or intrathecal granuloma formation with possible spinal cord compression and permanent paralysis.  Before refilling the pump Ms. Liew was informed that some of the medications used in the devise may not be FDA approved for such use and therefore it constitutes an off-label use of the medications.  Finally, she was informed that Medicine is not an exact science; therefore, there is also the possibility of unforeseen or unpredictable risks and/or possible complications that may result in a catastrophic outcome. The patient indicated having understood very clearly. We have given the patient no guarantees and we have made no promises. Enough time was given to the patient to ask questions, all of which were answered to the patient's satisfaction. Ms. Bassinger has  indicated that she wanted to continue with the procedure. Attestation: I, the ordering provider, attest that I have discussed with the patient the benefits, risks, side-effects, alternatives, likelihood of achieving goals, and potential problems during recovery for the procedure that I have provided informed consent. Date  Time: 02/19/2020  7:55 AM  Pre-Procedure Preparation:  Monitoring: As per clinic protocol. Respiration, ETCO2, SpO2, BP, heart rate and rhythm monitor placed and checked for adequate function Safety Precautions: Patient was assessed for positional comfort and pressure points before starting the procedure. Time-out: I initiated and conducted the "Time-out" before starting the procedure, as per protocol. The patient was asked to participate by confirming the accuracy of the "Time Out" information. Verification of the correct person, site, and procedure were performed and confirmed by me, the nursing staff, and the patient. "Time-out" conducted as per Joint Commission's Universal Protocol (UP.01.01.01). Time: 0806  Description of Procedure:          Position: Supine Target Area: Central-port of intrathecal pump. Approach: Anterior, 90 degree angle approach. Area Prepped: Entire Area around the pump implant. DuraPrep (Iodine Povacrylex [0.7% available iodine] and Isopropyl Alcohol, 74% w/w) Safety Precautions: Aspiration looking for blood return was conducted prior to all injections. At no point did we inject any substances, as a needle was being advanced. No attempts were made at seeking any paresthesias. Safe injection practices and needle disposal techniques used. Medications properly checked for expiration dates. SDV (single dose vial) medications used. Description of the Procedure: Protocol guidelines  were followed. Two nurses trained to do implant refills were present during the entire procedure. The refill medication was checked by both healthcare providers as well as the  patient. The patient was included in the "Time-out" to verify the medication. The patient was placed in position. The pump was identified. The area was prepped in the usual manner. The sterile template was positioned over the pump, making sure the side-port location matched that of the pump. Both, the pump and the template were held for stability. The needle provided in the Medtronic Kit was then introduced thru the center of the template and into the central port. The pump content was aspirated and discarded volume documented. The new medication was slowly infused into the pump, thru the filter, making sure to avoid overpressure of the device. The needle was then removed and the area cleansed, making sure to leave some of the prepping solution back to take advantage of its long term bactericidal properties. The pump was interrogated and programmed to reflect the correct medication, volume, and dosage. The program was printed and taken to the physician for approval. Once checked and signed by the physician, a copy was provided to the patient and another scanned into the EMR. Vitals:   02/19/20 0754  BP: 137/77  Pulse: 69  Resp: 18  Temp: 98.3 F (36.8 C)  SpO2: 95%  Weight: 170 lb (77.1 kg)  Height: _0  (1.575 m)    Start Time: 0806 hrs. End Time: 0816 hrs. Materials & Medications: Medtronic Refill Kit Medication(s): Please see chart orders for details.  Imaging Guidance:          Type of Imaging Technique: None used Indication(s): N/A Exposure Time: No patient exposure Contrast: None used. Fluoroscopic Guidance: N/A Ultrasound Guidance: N/A Interpretation: N/A  Antibiotic Prophylaxis:   Anti-infectives (From admission, onward)   None     Indication(s): None identified  Post-operative Assessment:  Post-procedure Vital Signs:  Pulse/HCG Rate: 69  Temp: 98.3 F (36.8 C) Resp: 18 BP: 137/77 SpO2: 95 %  EBL: None  Complications: No immediate post-treatment complications  observed by team, or reported by patient.  Note: The patient tolerated the entire procedure well. A repeat set of vitals were taken after the procedure and the patient was kept under observation following institutional policy, for this type of procedure. Post-procedural neurological assessment was performed, showing return to baseline, prior to discharge. The patient was provided with post-procedure discharge instructions, including a section on how to identify potential problems. Should any problems arise concerning this procedure, the patient was given instructions to immediately contact us, at any time, without hesitation. In any case, we plan to contact the patient by telephone for a follow-up status report regarding this interventional procedure.  Comments:  No additional relevant information.  Plan of Care  Orders:  Orders Placed This Encounter  Procedures  . PUMP REFILL    Maintain Protocol by having two(2) healthcare providers during procedure and programming.    Scheduling Instructions:     Please refill intrathecal pump today.    Order Specific Question:   Where will this procedure be performed?    Answer:   [facility]  . PUMP REFILL    Whenever possible schedule on a procedure today.    Standing Status:   Future    Standing Expiration Date:   07/18/2020    Scheduling Instructions:     Please schedule intrathecal pump refill based on pump programming. Avoid schedule intervals of more than 120  days (4 months).    Order Specific Question:   Where will this procedure be performed?    Answer:   [facility]  . Informed Consent Details: Physician/Practitioner Attestation; Transcribe to consent form and obtain patient signature    Transcribe to consent form and obtain patient signature.    Order Specific Question:   Physician/Practitioner attestation of informed consent for procedure/surgical case    Answer:   I, the physician/practitioner, attest that I have discussed  with the patient the benefits, risks, side effects, alternatives, likelihood of achieving goals and potential problems during recovery for the procedure that I have provided informed consent.    Order Specific Question:   Procedure    Answer:   Intrathecal pump refill    Order Specific Question:   Physician/Practitioner performing the procedure    Answer:   Attending Physician: Kathlen Brunswick. Dossie Arbour, MD & designated trained staff    Order Specific Question:   Indication/Reason    Answer:   Chronic Pain Syndrome (G89.4)   Chronic Opioid Analgesic:  No other oral opioid analgesics prescribed by our practice. Intrathecal PF-Hydromorphone 3.9295 mg/day + PF-Sufentanil 34.383 mcg/day. MME: Aprox. 58.7 mg/day.   Medications ordered for procedure: No orders of the defined types were placed in this encounter.  Medications administered: Tiffany Hunter had no medications administered during this visit.  See the medical record for exact dosing, route, and time of administration.  Follow-up plan:   Return for Pump Refill (Max:15mo.       Interventional management options: Planned, scheduled, and/or pending:   Not at this time.   Considering:   Diagnostic left sided lumbar facet block #2 Possible left-sided lumbar facet RFA   Palliative PRN treatment(s):   Palliative Intrathecal pump refillas per programming  Diagnostic left sided lumbar facet block #2          Recent Visits Date Type Provider Dept  02/18/20 Procedure visit NMilinda Pointer MD [facility]  12/10/19 Procedure visit NMilinda Pointer MD [facility]  Showing recent visits within past 90 days and meeting all other requirements Today's Visits Date Type Provider Dept  02/19/20 Procedure visit NMilinda Pointer MD [facility]  Showing today's visits and meeting all other requirements Future Appointments Date Type Provider Dept  04/28/20 Appointment NMilinda Pointer MD [facility]  Showing future appointments within next 90 days and meeting all other requirements  Disposition: Discharge home  Discharge (Date  Time): 02/19/2020;   hrs.   Primary Care Physician: WIva Lento PA-C [facility] Note by: FGaspar Cola MD Date: 02/19/2020; Time: 8:30 AM  Disclaimer:  Medicine is not an eChief Strategy Officer The only guarantee in medicine is that nothing is guaranteed. It is important to note that the decision to proceed with this intervention was based on the information collected from the patient. The Data and conclusions were drawn from the patient's questionnaire, the interview, and the physical examination. Because the information was provided in large part by the patient, it cannot be guaranteed that it has not been purposely or unconsciously manipulated. Every effort has been made to obtain as much relevant data as possible for this evaluation. It is important to note that the conclusions that lead to this procedure are derived in large part from the available data. Always take into account that the treatment will also be dependent on availability of resources and existing treatment guidelines, considered by other Pain Management Practitioners as being common knowledge and  practice, at the time of the intervention. For Medico-Legal purposes, it is also important to point out that variation in procedural techniques and pharmacological choices are the acceptable norm. The indications, contraindications, technique, and results of the above procedure should only be interpreted and judged by a Board-Certified Interventional Pain Specialist with extensive familiarity and expertise in the same exact procedure and technique.

## 2020-02-18 NOTE — Progress Notes (Signed)
No show

## 2020-02-19 ENCOUNTER — Other Ambulatory Visit: Payer: Self-pay

## 2020-02-19 ENCOUNTER — Ambulatory Visit: Payer: Medicare Other | Attending: Pain Medicine | Admitting: Pain Medicine

## 2020-02-19 ENCOUNTER — Encounter: Payer: Self-pay | Admitting: Pain Medicine

## 2020-02-19 ENCOUNTER — Encounter (INDEPENDENT_AMBULATORY_CARE_PROVIDER_SITE_OTHER): Payer: Self-pay

## 2020-02-19 VITALS — BP 137/77 | HR 69 | Temp 98.3°F | Resp 18 | Ht 62.0 in | Wt 170.0 lb

## 2020-02-19 DIAGNOSIS — M961 Postlaminectomy syndrome, not elsewhere classified: Secondary | ICD-10-CM

## 2020-02-19 DIAGNOSIS — Z978 Presence of other specified devices: Secondary | ICD-10-CM

## 2020-02-19 DIAGNOSIS — Z888 Allergy status to other drugs, medicaments and biological substances status: Secondary | ICD-10-CM | POA: Insufficient documentation

## 2020-02-19 DIAGNOSIS — G8929 Other chronic pain: Secondary | ICD-10-CM

## 2020-02-19 DIAGNOSIS — G894 Chronic pain syndrome: Secondary | ICD-10-CM

## 2020-02-19 DIAGNOSIS — Z882 Allergy status to sulfonamides status: Secondary | ICD-10-CM | POA: Diagnosis not present

## 2020-02-19 DIAGNOSIS — M79605 Pain in left leg: Secondary | ICD-10-CM | POA: Insufficient documentation

## 2020-02-19 DIAGNOSIS — M5442 Lumbago with sciatica, left side: Secondary | ICD-10-CM | POA: Diagnosis present

## 2020-02-19 DIAGNOSIS — Z451 Encounter for adjustment and management of infusion pump: Secondary | ICD-10-CM | POA: Diagnosis not present

## 2020-02-19 NOTE — Progress Notes (Signed)
Safety precautions to be maintained throughout the outpatient stay will include: orient to surroundings, keep bed in low position, maintain call bell within reach at all times, provide assistance with transfer out of bed and ambulation.  

## 2020-02-19 NOTE — Patient Instructions (Signed)
Opioid Overdose Opioids are drugs that are often used to treat pain. Opioids include illegal drugs, such as heroin, as well as prescription pain medicines, such as codeine, morphine, hydrocodone, oxycodone, and fentanyl. An opioid overdose happens when you take too much of an opioid. An overdose may be intentional or accidental and can happen with any type of opioid. The effects of an overdose can be mild, dangerous, or even deadly. Opioid overdose is a medical emergency. What are the causes? This condition may be caused by:  Taking too much of an opioid on purpose.  Taking too much of an opioid by accident.  Using two or more substances that contain opioids at the same time.  Taking an opioid with a substance that affects your heart, breathing, or blood pressure. These include alcohol, tranquilizers, sleeping pills, illegal drugs, and some over-the-counter medicines. This condition may also happen due to an error made by:  A health care provider who prescribes a medicine.  The pharmacist who fills the prescription order. What increases the risk? This condition is more likely in:  Children. They may be attracted to colorful pills. Because of a child's small size, even a small amount of a drug can be dangerous.  Older people. They may be taking many different drugs. Older people may have difficulty reading labels or remembering when they last took their medicine. They may also be more sensitive to the effects of opioids.  People with chronic medical conditions, especially heart, liver, kidney, or neurological diseases.  People who take an opioid for a long period of time.  People who use: ? Illegal drugs. IV heroin is especially dangerous. ? Other substances, including alcohol, while using an opioid.  People who have: ? A history of drug or alcohol abuse. ? Certain mental health conditions. ? A history of previous drug overdoses.  People who take opioids that are not prescribed  for them. What are the signs or symptoms? Symptoms of this condition depend on the type of opioid and the amount that was taken. Common symptoms include:  Sleepiness or difficulty waking from sleep.  Decrease in attention.  Confusion.  Slurred speech.  Slowed breathing and a slow pulse (bradycardia).  Nausea and vomiting.  Abnormally small pupils. Signs and symptoms that require emergency treatment include:  Cold, clammy, and pale skin.  Blue lips and fingernails.  Vomiting.  Gurgling sounds in the throat.  A pulse that is very slow or difficult to detect.  Breathing that is very irregular, slow, noisy, or difficult to detect.  Limp body.  Inability to respond to speech or be awakened from sleep (stupor).  Seizures. How is this diagnosed? This condition is diagnosed based on your symptoms and medical history. It is important to tell your health care provider:  About all of the opioids that you took.  When you took the opioids.  Whether you were drinking alcohol or using marijuana, cocaine, or other drugs. Your health care provider will do a physical exam. This exam may include:  Checking and monitoring your heart rate and rhythm, breathing rate, temperature, and blood pressure (vital signs).  Measuring oxygen levels in your blood.  Checking for abnormally small pupils. You may also have blood tests or urine tests. You may have X-rays if you are having severe breathing problems. How is this treated? This condition requires immediate medical treatment and hospitalization. Treatment is given in the hospital intensive care (ICU) setting. Supporting your vital signs and your breathing is the first step in  treating an opioid overdose. Treatment may also include:  Giving salts and minerals (electrolytes) along with fluids through an IV.  Inserting a breathing tube (endotracheal tube) in your airway to help you breathe if you cannot breathe on your own or you are in  danger of not being able to breathe on your own.  Giving oxygen through a small tube under your nose.  Passing a tube through your nose and into your stomach (nasogastric tube, or NG tube) to empty your stomach.  Giving medicines that: ? Increase your blood pressure. ? Relieve nausea and vomiting. ? Relieve abdominal pain and cramping. ? Reverse the effects of the opioid (naloxone).  Monitoring your heart and oxygen levels.  Ongoing counseling and mental health support if you intentionally overdosed or used an illegal drug. Follow these instructions at home:  Medicines  Take over-the-counter and prescription medicines only as told by your health care provider.  Always ask your health care provider about possible side effects and interactions of any new medicine that you start taking.  Keep a list of all the medicines that you take, including over-the-counter medicines. Bring this list with you to all your medical visits. General instructions  Drink enough fluid to keep your urine pale yellow.  Keep all follow-up visits as told by your health care provider. This is important. How is this prevented?  Read the drug inserts that come with your opioid pain medicines.  Take medicines only as told by your health care provider. Do not take more medicine than you are told. Do not take medicines more frequently than you are told.  Do not drink alcohol or take sedatives when taking opioids.  Do not use illegal or recreational drugs, including cocaine, ecstasy, and marijuana.  Do not take opioid medicines that are not prescribed for you.  Store all medicines in safety containers that are out of the reach of children.  Get help if you are struggling with: ? Alcohol or drug use. ? Depression or another mental health problem. ? Thoughts of hurting yourself or another person.  Keep the phone number of your local poison control center near your phone or in your mobile phone. In the  [place] the hotline of the National Poison Control Center is (800) 222-1222.  If you were prescribed naloxone, make sure you understand how to take it. Contact a health care provider if you:  Need help understanding how to take your pain medicines.  Feel your medicines are too strong.  Are concerned that your pain medicines are not working well for your pain.  Develop new symptoms or side effects when you are taking medicines. Get help right away if:  You or someone else is having symptoms of an opioid overdose. Get help even if you are not sure.  You have serious thoughts about hurting yourself or others.  You have: ? Chest pain. ? Difficulty breathing. ? A loss of consciousness. These symptoms may represent a serious problem that is an emergency. Do not wait to see if the symptoms will go away. Get medical help right away. Call your local emergency services (911 in the [place] Do not drive yourself to the hospital. If you ever feel like you may hurt yourself or others, or have thoughts about taking your own life, get help right away. You can go to your nearest emergency department or call:  Your local emergency services (911 in the [place]  A suicide crisis helpline, such as the National Suicide Prevention Lifeline at   1-800-273-8255. This is open 24 hours a day. Summary  Opioids are drugs that are often used to treat pain. Opioids include illegal drugs, such as heroin, as well as prescription pain medicines.  An opioid overdose happens when you take too much of an opioid.  Overdoses can be intentional or accidental.  Opioid overdose is very dangerous. It is a life-threatening emergency.  If you or someone you know is experiencing an opioid overdose, get help right away. This information is not intended to replace advice given to you by your health care provider. Make sure you discuss any questions you have with your health care provider. Document Revised: 04/19/2018 Document  Reviewed: 04/19/2018 [employer] Patient Education  2020 [employer]  

## 2020-02-23 MED FILL — Medication: INTRATHECAL | Qty: 1 | Status: AC

## 2020-04-01 ENCOUNTER — Other Ambulatory Visit: Payer: Self-pay

## 2020-04-01 MED ORDER — PAIN MANAGEMENT IT PUMP REFILL: 1.0000 | Freq: Once | INTRATHECAL | 0 refills | Status: AC

## 2020-04-26 NOTE — Progress Notes (Signed)
PROVIDER NOTE: Information contained herein reflects review and annotations entered in association with encounter. Interpretation of such information and data should be left to medically-trained personnel. Information provided to patient can be located elsewhere in the medical record under "Patient Instructions". Document created using STT-dictation technology, any transcriptional errors that may result from process are unintentional.    Patient: Tiffany Hunter  Service Category: Procedure  Provider: Gaspar Cola, MD  DOB: [birth year]  DOS: 04/28/2020  Location: [facility]  MRN: [id number]  Setting: Ambulatory - outpatient  Referring Provider: Iva Lento, PA-C  Type: Established Patient  Specialty: Interventional Pain Management  PCP: Iva Lento, PA-C   Primary Reason for Visit: Interventional Pain Management Treatment. CC: Back Pain (low)  Procedure:          Intrathecal Drug Delivery System (IDDS):  Type: Reservoir Refill [phone])       Region: Abdominal Laterality: Left  Type of Pump: Medtronic Synchromed II Delivery Route: Intrathecal Type of Pain Treated: Neuropathic/Nociceptive Primary Medication Class: Opioid/opiate  Medication, Concentration, Infusion Program, & Delivery Rate: Please see scanned programming printout.   Indications: 1. Chronic pain syndrome   2. Chronic low back pain (Primary Area of Pain) (Left)   3. 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 Encounter for adjustment or management of infusion pump   7. DDD (degenerative disc disease), lumbosacral   8. Chronic lumbar radicular pain (L4 Dermatome) (Left)   9. Pharmacologic therapy   10. Uncomplicated opioid dependence ([place])    Pain Assessment: Self-Reported Pain Score: 9 /10             Reported level is compatible with  observation.         The patient today indicates that she feels that the pump is no longer working for her she think is due to tolerance and she was wondering if we would be changing the medicine on the pump.  I told her that that is not what I do for the management of tolerance, what I normally do is basically taper the medication down and completely stop it for period no shorter than 14 consecutive days in order to eliminate the excess receptors and the tolerance to the medication.  However I also asked her if the pain that she was experiencing with that of the lower back where we know she has a lumbar facet syndrome that every so often will flareup.  In the past we had done some palliative lumbar facet blocks which do help bring the pain down and get everything under control so as to allow the pump to work better.  When given the alternative, she decided to go for the block instead of the "Drug Holiday".  Pharmacotherapy Assessment  Analgesic: No other oral opioid analgesics prescribed by our practice. Intrathecal PF-Hydromorphone 3.9295 mg/day + PF-Sufentanil 34.383 mcg/day. MME: Aprox. 58.7 mg/day.   Monitoring: [place] PMP: PDMP reviewed during this encounter.       Pharmacotherapy: No side-effects or adverse reactions reported. Compliance: No problems identified. Effectiveness: Clinically acceptable. Plan: Refer to "POC".  UDS:  Summary  Date Value Ref Range Status  07/30/2019 Note  Final    Comment:    ==================================================================== Compliance Drug Analysis, Ur ==================================================================== Test  Result       Flag       Units Drug Present not Declared for Prescription Verification   Hydromorphone                  1757         UNEXPECTED ng/mg creat    Hydromorphone may be administered as a scheduled prescription    medication; it is also an expected metabolite of hydrocodone.    Naproxen                       PRESENT      UNEXPECTED Drug Absent but Declared for Prescription Verification   Salicylate                     Not Detected UNEXPECTED    Aspirin, as indicated in the declared medication list, is not always    detected even when used as directed. ==================================================================== Test                      Result    Flag   Units      Ref Range   Creatinine              182              mg/dL      >=20 ==================================================================== Declared Medications:  The flagging and interpretation on this report are based on the  following declared medications.  Unexpected results may arise from  inaccuracies in the declared medications.  **Note: The testing scope of this panel does not include small to  moderate amounts of these reported medications:  Aspirin  **Note: The testing scope of this panel does not include the  following reported medications:  Albuterol (Ventolin HFA)  Atorvastatin (Lipitor)  Betamethasone (Lotrisone)  Carvedilol (Coreg)  Cholecalciferol  Clotrimazole (Lotrisone)  Hydrocortisone  Mirabegron (Myrbetriq)  Naloxegol (Movantik)  Oxybutynin (Ditropan)  Supplement ==================================================================== For clinical consultation, please call [phone]. ====================================================================     Intrathecal Pump Therapy Assessment  Manufacturer: Medtronic Synchromed Type: Programmable Volume: 40 mL reservoir MRI compatibility: Yes   Drug content:  Primary Medication Class:Opioid Primary Medication:PF-Hydromorphone (Dilaudid)(37m/mL) Secondary Medication:PF-Sufentanil(70 g/mL) Other Medication:No third medication    Programming:  Type: Simple continuous. See pump readout for details.   Changes:  Medication Change: None at this point Rate Change: No change in rate  Reported  side-effects or adverse reactions: None reported  Effectiveness: Described as relatively effective, allowing for increase in activities of daily living (ADL) Clinically meaningful improvement in function (CMIF): Sustained CMIF goals met  Plan: Pump refill today  Pre-op H&P Assessment:  Ms. MYearwoodis a 74y.o. (year old), female patient, seen today for interventional treatment. She  has a past surgical history that includes Carpal tunnel release (Bilateral); Back surgery; Breast surgery (Bilateral); Cholecystectomy; Colonoscopy w/ polypectomy; Cataract extraction w/ intraocular lens implant (Right); Abdominal hysterectomy; Fracture surgery (Right); Cardiac catheterization (2012); and Pain pump revision (Left, 07/07/2017). Ms. MLaumannhas a current medication list which includes the following prescription(s): albuterol, aspirin ec, atorvastatin, carvedilol, meloxicam, naloxegol oxalate, tramadol, and benefiber. Her primarily concern today is the Back Pain (low)  Initial Vital Signs:  Pulse/HCG Rate: 70  Temp: 97.6 F (36.4 C) Resp: 18 BP: (!) 141/109 SpO2: 97 %  BMI: Estimated body mass index is 31.09 kg/m as calculated from the following:   Height as of this encounter: '5\' 2"'  (1.575 m).  Weight as of this encounter: 170 lb (77.1 kg).  Risk Assessment: Allergies: Reviewed. She is allergic to doxycycline hyclate and sulfa antibiotics.  Allergy Precautions: None required Coagulopathies: Reviewed. None identified.  Blood-thinner therapy: None at this time Active Infection(s): Reviewed. None identified. Ms. Kovacik is afebrile  Site Confirmation: Ms. Rickles was asked to confirm the procedure and laterality before marking the site Procedure checklist: Completed Consent: Before the procedure and under the influence of no sedative(s), amnesic(s), or anxiolytics, the patient was informed of the treatment options, risks and possible complications. To fulfill our ethical and legal  obligations, as recommended by the American Medical Association's Code of Ethics, I have informed the patient of my clinical impression; the nature and purpose of the treatment or procedure; the risks, benefits, and possible complications of the intervention; the alternatives, including doing nothing; the risk(s) and benefit(s) of the alternative treatment(s) or procedure(s); and the risk(s) and benefit(s) of doing nothing.  Ms. Halder was provided with information about the general risks and possible complications associated with most interventional procedures. These include, but are not limited to: failure to achieve desired goals, infection, bleeding, organ or nerve damage, allergic reactions, paralysis, and/or death.  In addition, she was informed of those risks and possible complications associated to this particular procedure, which include, but are not limited to: damage to the implant; failure to decrease pain; local, systemic, or serious CNS infections, intraspinal abscess with possible cord compression and paralysis, or life-threatening such as meningitis; bleeding; organ damage; nerve injury or damage with subsequent sensory, motor, and/or autonomic system dysfunction, resulting in transient or permanent pain, numbness, and/or weakness of one or several areas of the body; allergic reactions, either minor or major life-threatening, such as anaphylactic or anaphylactoid reactions.  Furthermore, Ms. Curci was informed of those risks and complications associated with the medications. These include, but are not limited to: allergic reactions (i.e.: anaphylactic or anaphylactoid reactions); endorphine suppression; bradycardia and/or hypotension; water retention and/or peripheral vascular relaxation leading to lower extremity edema and possible stasis ulcers; respiratory depression and/or shortness of breath; decreased metabolic rate leading to weight gain; swelling or edema; medication-induced neural  toxicity; particulate matter embolism and blood vessel occlusion with resultant organ, and/or nervous system infarction; and/or intrathecal granuloma formation with possible spinal cord compression and permanent paralysis.  Before refilling the pump Ms. Bodkin was informed that some of the medications used in the devise may not be FDA approved for such use and therefore it constitutes an off-label use of the medications.  Finally, she was informed that Medicine is not an exact science; therefore, there is also the possibility of unforeseen or unpredictable risks and/or possible complications that may result in a catastrophic outcome. The patient indicated having understood very clearly. We have given the patient no guarantees and we have made no promises. Enough time was given to the patient to ask questions, all of which were answered to the patient's satisfaction. Ms. Isley has indicated that she wanted to continue with the procedure. Attestation: I, the ordering provider, attest that I have discussed with the patient the benefits, risks, side-effects, alternatives, likelihood of achieving goals, and potential problems during recovery for the procedure that I have provided informed consent. Date   Time: 04/28/2020  1:16 PM  Pre-Procedure Preparation:  Monitoring: As per clinic protocol. Respiration, ETCO2, SpO2, BP, heart rate and rhythm monitor placed and checked for adequate function Safety Precautions: Patient was assessed for positional comfort and pressure points before starting the procedure. Time-out: I  initiated and conducted the "Time-out" before starting the procedure, as per protocol. The patient was asked to participate by confirming the accuracy of the "Time Out" information. Verification of the correct person, site, and procedure were performed and confirmed by me, the nursing staff, and the patient. "Time-out" conducted as per Joint Commission's Universal Protocol  (UP.01.01.01). Time: 1323  Description of Procedure:          Position: Supine Target Area: Central-port of intrathecal pump. Approach: Anterior, 90 degree angle approach. Area Prepped: Entire Area around the pump implant. DuraPrep (Iodine Povacrylex [0.7% available iodine] and Isopropyl Alcohol, 74% w/w) Safety Precautions: Aspiration looking for blood return was conducted prior to all injections. At no point did we inject any substances, as a needle was being advanced. No attempts were made at seeking any paresthesias. Safe injection practices and needle disposal techniques used. Medications properly checked for expiration dates. SDV (single dose vial) medications used. Description of the Procedure: Protocol guidelines were followed. Two nurses trained to do implant refills were present during the entire procedure. The refill medication was checked by both healthcare providers as well as the patient. The patient was included in the "Time-out" to verify the medication. The patient was placed in position. The pump was identified. The area was prepped in the usual manner. The sterile template was positioned over the pump, making sure the side-port location matched that of the pump. Both, the pump and the template were held for stability. The needle provided in the Medtronic Kit was then introduced thru the center of the template and into the central port. The pump content was aspirated and discarded volume documented. The new medication was slowly infused into the pump, thru the filter, making sure to avoid overpressure of the device. The needle was then removed and the area cleansed, making sure to leave some of the prepping solution back to take advantage of its long term bactericidal properties. The pump was interrogated and programmed to reflect the correct medication, volume, and dosage. The program was printed and taken to the physician for approval. Once checked and signed by the physician, a copy  was provided to the patient and another scanned into the EMR. Vitals:   04/28/20 1315 04/28/20 1317  BP: (!) 141/109 (!) 161/87  Pulse: 70   Resp: 18   Temp: 97.6 F (36.4 C)   SpO2: 97%   Weight: 170 lb (77.1 kg)   Height: '5\' 2"'  (1.575 m)     Start Time: 1323 hrs. End Time: 1332 hrs. Materials & Medications: Medtronic Refill Kit Medication(s): Please see chart orders for details.  Imaging Guidance:          Type of Imaging Technique: None used Indication(s): N/A Exposure Time: No patient exposure Contrast: None used. Fluoroscopic Guidance: N/A Ultrasound Guidance: N/A Interpretation: N/A  Antibiotic Prophylaxis:   Anti-infectives (From admission, onward)   None     Indication(s): None identified  Post-operative Assessment:  Post-procedure Vital Signs:  Pulse/HCG Rate: 70  Temp: 97.6 F (36.4 C) Resp: 18 BP: (!) 161/87 SpO2: 97 %  EBL: None  Complications: No immediate post-treatment complications observed by team, or reported by patient.  Note: The patient tolerated the entire procedure well. A repeat set of vitals were taken after the procedure and the patient was kept under observation following institutional policy, for this type of procedure. Post-procedural neurological assessment was performed, showing return to baseline, prior to discharge. The patient was provided with post-procedure discharge instructions, including a section on  how to identify potential problems. Should any problems arise concerning this procedure, the patient was given instructions to immediately contact us, at any time, without hesitation. In any case, we plan to contact the patient by telephone for a follow-up status report regarding this interventional procedure.  Comments:  No additional relevant information.  Plan of Care  Orders:  Orders Placed This Encounter  Procedures   PUMP REFILL    Maintain Protocol by having two(2) healthcare providers during procedure and programming.     Scheduling Instructions:     Please refill intrathecal pump today.    Order Specific Question:   Where will this procedure be performed?    Answer:   [facility]   PUMP REFILL    Whenever possible schedule on a procedure today.    Standing Status:   Future    Standing Expiration Date:   09/25/2020    Scheduling Instructions:     Please schedule intrathecal pump refill based on pump programming. Avoid schedule intervals of more than 120 days (4 months).    Order Specific Question:   Where will this procedure be performed?    Answer:   [facility]   LUMBAR FACET(MEDIAL BRANCH NERVE BLOCK) MBNB    Standing Status:   Future    Standing Expiration Date:   05/29/2020    Scheduling Instructions:     Procedure: Lumbar facet block (AKA.: Lumbosacral medial branch nerve block)     Side: Left-sided     Level: L3-4, L4-5, & L5-S1 Facets (L2, L3, L4, L5, & S1 Medial Branch Nerves)     Sedation: Patient's choice.     Timeframe: ASAA    Order Specific Question:   Where will this procedure be performed?    Answer:   [facility]   Informed Consent Details: Physician/Practitioner Attestation; Transcribe to consent form and obtain patient signature    Transcribe to consent form and obtain patient signature.    Order Specific Question:   Physician/Practitioner attestation of informed consent for procedure/surgical case    Answer:   I, the physician/practitioner, attest that I have discussed with the patient the benefits, risks, side effects, alternatives, likelihood of achieving goals and potential problems during recovery for the procedure that I have provided informed consent.    Order Specific Question:   Procedure    Answer:   Intrathecal pump refill    Order Specific Question:   Physician/Practitioner performing the procedure    Answer:   Attending Physician: Kathlen Brunswick. Dossie Arbour, MD & designated trained staff    Order Specific Question:   Indication/Reason    Answer:    Chronic Pain Syndrome (G89.4), presence of an intrathecal pump (Z97.8)   Chronic Opioid Analgesic:  No other oral opioid analgesics prescribed by our practice. Intrathecal PF-Hydromorphone 3.9295 mg/day + PF-Sufentanil 34.383 mcg/day. MME: Aprox. 58.7 mg/day.   Medications ordered for procedure: No orders of the defined types were placed in this encounter.  Medications administered: Tiffany Hunter had no medications administered during this visit.  See the medical record for exact dosing, route, and time of administration.  Follow-up plan:   Return for Procedure (w/ sedation): (L) L-FCT BLK #2.       Interventional management options: Planned, scheduled, and/or pending:   Diagnostic left versus bilateral lumbar facet block #2    Considering:   Diagnostic left sided lumbar facet block #2 Possible left-sided lumbar facet RFA   Palliative PRN treatment(s):   Palliative Intrathecal pump refillas per  programming  Diagnostic left sided lumbar facet block #2           Recent Visits Date Type Provider Dept  02/19/20 Procedure visit Milinda Pointer, MD [facility]  Showing recent visits within past 90 days and meeting all other requirements Today's Visits Date Type Provider Dept  04/28/20 Procedure visit Milinda Pointer, MD [facility]  Showing today's visits and meeting all other requirements Future Appointments Date Type Provider Dept  05/07/20 Appointment Milinda Pointer, [facility]  07/07/20 Appointment Milinda Pointer, MD [facility]  Showing future appointments within next 90 days and meeting all other requirements  Disposition: Discharge home  Discharge (Date   Time): 04/28/2020;   hrs.   Primary Care Physician: Iva Lento, PA-C Location: [facility] Note by: Gaspar Cola, MD Date: 04/28/2020; Time: 2:13 PM  Disclaimer:  Medicine is not an Chief Strategy Officer. The only  guarantee in medicine is that nothing is guaranteed. It is important to note that the decision to proceed with this intervention was based on the information collected from the patient. The Data and conclusions were drawn from the patient's questionnaire, the interview, and the physical examination. Because the information was provided in large part by the patient, it cannot be guaranteed that it has not been purposely or unconsciously manipulated. Every effort has been made to obtain as much relevant data as possible for this evaluation. It is important to note that the conclusions that lead to this procedure are derived in large part from the available data. Always take into account that the treatment will also be dependent on availability of resources and existing treatment guidelines, considered by other Pain Management Practitioners as being common knowledge and practice, at the time of the intervention. For Medico-Legal purposes, it is also important to point out that variation in procedural techniques and pharmacological choices are the acceptable norm. The indications, contraindications, technique, and results of the above procedure should only be interpreted and judged by a Board-Certified Interventional Pain Specialist with extensive familiarity and expertise in the same exact procedure and technique.

## 2020-04-28 ENCOUNTER — Ambulatory Visit: Payer: Medicare Other | Attending: Pain Medicine | Admitting: Pain Medicine

## 2020-04-28 ENCOUNTER — Other Ambulatory Visit: Payer: Self-pay

## 2020-04-28 ENCOUNTER — Encounter: Payer: Self-pay | Admitting: Pain Medicine

## 2020-04-28 VITALS — BP 161/87 | HR 70 | Temp 97.6°F | Resp 18 | Ht 62.0 in | Wt 170.0 lb

## 2020-04-28 DIAGNOSIS — M47816 Spondylosis without myelopathy or radiculopathy, lumbar region: Secondary | ICD-10-CM

## 2020-04-28 DIAGNOSIS — M961 Postlaminectomy syndrome, not elsewhere classified: Secondary | ICD-10-CM | POA: Diagnosis present

## 2020-04-28 DIAGNOSIS — G8929 Other chronic pain: Secondary | ICD-10-CM

## 2020-04-28 DIAGNOSIS — M5416 Radiculopathy, lumbar region: Secondary | ICD-10-CM | POA: Diagnosis present

## 2020-04-28 DIAGNOSIS — M79605 Pain in left leg: Secondary | ICD-10-CM | POA: Insufficient documentation

## 2020-04-28 DIAGNOSIS — M5137 Other intervertebral disc degeneration, lumbosacral region: Secondary | ICD-10-CM | POA: Diagnosis present

## 2020-04-28 DIAGNOSIS — M5442 Lumbago with sciatica, left side: Secondary | ICD-10-CM | POA: Diagnosis present

## 2020-04-28 DIAGNOSIS — Z978 Presence of other specified devices: Secondary | ICD-10-CM | POA: Diagnosis present

## 2020-04-28 DIAGNOSIS — Z451 Encounter for adjustment and management of infusion pump: Secondary | ICD-10-CM

## 2020-04-28 DIAGNOSIS — G894 Chronic pain syndrome: Secondary | ICD-10-CM

## 2020-04-28 DIAGNOSIS — Z79899 Other long term (current) drug therapy: Secondary | ICD-10-CM | POA: Diagnosis present

## 2020-04-28 DIAGNOSIS — F1120 Opioid dependence, uncomplicated: Secondary | ICD-10-CM | POA: Diagnosis present

## 2020-04-28 DIAGNOSIS — M51379 Other intervertebral disc degeneration, lumbosacral region without mention of lumbar back pain or lower extremity pain: Secondary | ICD-10-CM

## 2020-04-28 NOTE — Patient Instructions (Addendum)
____________________________________________________________________________________________  Preparing for Procedure with Sedation  Procedure appointments are limited to planned procedures: . No Prescription Refills. . No disability issues will be discussed. . No medication changes will be discussed.  Instructions: . Oral Intake: Do not eat or drink anything for at least 8 hours prior to your procedure. (Exception: Blood Pressure Medication. See below.) . Transportation: Unless otherwise stated by your physician, you may drive yourself after the procedure. . Blood Pressure Medicine: Do not forget to take your blood pressure medicine with a sip of water the morning of the procedure. If your Diastolic (lower reading)is above 100 mmHg, elective cases will be cancelled/rescheduled. . Blood thinners: These will need to be stopped for procedures. Notify our staff if you are taking any blood thinners. Depending on which one you take, there will be specific instructions on how and when to stop it. . Diabetics on insulin: Notify the staff so that you can be scheduled 1st case in the morning. If your diabetes requires high dose insulin, take only  of your normal insulin dose the morning of the procedure and notify the staff that you have done so. . Preventing infections: Shower with an antibacterial soap the morning of your procedure. . Build-up your immune system: Take 1000 mg of Vitamin C with every meal (3 times a day) the day prior to your procedure. . Antibiotics: Inform the staff if you have a condition or reason that requires you to take antibiotics before dental procedures. . Pregnancy: If you are pregnant, call and cancel the procedure. . Sickness: If you have a cold, fever, or any active infections, call and cancel the procedure. . Arrival: You must be in the facility at least 30 minutes prior to your scheduled procedure. . Children: Do not bring children with you. . Dress appropriately:  Bring dark clothing that you would not mind if they get stained. . Valuables: Do not bring any jewelry or valuables.  Reasons to call and reschedule or cancel your procedure: (Following these recommendations will minimize the risk of a serious complication.) . Surgeries: Avoid having procedures within 2 weeks of any surgery. (Avoid for 2 weeks before or after any surgery). . Flu Shots: Avoid having procedures within 2 weeks of a flu shots or . (Avoid for 2 weeks before or after immunizations). . Barium: Avoid having a procedure within 7-10 days after having had a radiological study involving the use of radiological contrast. (Myelograms, Barium swallow or enema study). . Heart attacks: Avoid any elective procedures or surgeries for the initial 6 months after a "Myocardial Infarction" (Heart Attack). . Blood thinners: It is imperative that you stop these medications before procedures. Let us know if you if you take any blood thinner.  . Infection: Avoid procedures during or within two weeks of an infection (including chest colds or gastrointestinal problems). Symptoms associated with infections include: Localized redness, fever, chills, night sweats or profuse sweating, burning sensation when voiding, cough, congestion, stuffiness, runny nose, sore throat, diarrhea, nausea, vomiting, cold or Flu symptoms, recent or current infections. It is specially important if the infection is over the area that we intend to treat. . Heart and lung problems: Symptoms that may suggest an active cardiopulmonary problem include: cough, chest pain, breathing difficulties or shortness of breath, dizziness, ankle swelling, uncontrolled high or unusually low blood pressure, and/or palpitations. If you are experiencing any of these symptoms, cancel your procedure and contact your primary care physician for an evaluation.  Remember:  Regular Business hours are:    Monday to Thursday 8:00 AM to 4:00 PM  Provider's  Schedule: Milinda Pointer, MD:  Procedure days: Tuesday and Thursday 7:30 AM to 4:00 PM  Gillis Santa, MD:  Procedure days: Monday and Wednesday 7:30 AM to 4:00 PM ____________________________________________________________________________________________   Preparing for Procedure with Sedation Instructions: . Oral Intake: Do not eat or drink anything for at least 8 hours prior to your procedure. . Transportation: Public transportation is not allowed. Bring an adult driver. The driver must be physically present in our waiting room before any procedure can be started. Marland Kitchen Physical Assistance: Bring an adult capable of physically assisting you, in the event you need help. . Blood Pressure Medicine: Take your blood pressure medicine with a sip of water the morning of the procedure. . Insulin: Take only  of your normal insulin dose. . Preventing infections: Shower with an antibacterial soap the morning of your procedure. . Build-up your immune system: Take 1000 mg of Vitamin C with every meal (3 times a day) the day prior to your procedure. . Pregnancy: If you are pregnant, call and cancel the procedure. . Sickness: If you have a cold, fever, or any active infections, call and cancel the procedure. . Arrival: You must be in the facility at least 30 minutes prior to your scheduled procedure. . Children: Do not bring children with you. . Dress appropriately: Bring dark clothing that you would not mind if they get stained. . Valuables: Do not bring any jewelry or valuables. Procedure appointments are reserved for interventional treatments only. Marland Kitchen No Prescription Refills. . No medication changes will be discussed during procedure appointments. . No disability issues will be discussed. Marland Kitchen  Opioid Overdose Opioids are drugs that are often used to treat pain. Opioids include illegal drugs, such as heroin, as well as prescription pain medicines, such as codeine, morphine, hydrocodone, oxycodone,  and fentanyl. An opioid overdose happens when you take too much of an opioid. An overdose may be intentional or accidental and can happen with any type of opioid. The effects of an overdose can be mild, dangerous, or even deadly. Opioid overdose is a medical emergency. What are the causes? This condition may be caused by:  Taking too much of an opioid on purpose.  Taking too much of an opioid by accident.  Using two or more substances that contain opioids at the same time.  Taking an opioid with a substance that affects your heart, breathing, or blood pressure. These include alcohol, tranquilizers, sleeping pills, illegal drugs, and some over-the-counter medicines. This condition may also happen due to an error made by:  A health care provider who prescribes a medicine.  The pharmacist who fills the prescription order. What increases the risk? This condition is more likely in:  Children. They may be attracted to colorful pills. Because of a child's small size, even a small amount of a drug can be dangerous.  Older people. They may be taking many different drugs. Older people may have difficulty reading labels or remembering when they last took their medicine. They may also be more sensitive to the effects of opioids.  People with chronic medical conditions, especially heart, liver, kidney, or neurological diseases.  People who take an opioid for a long period of time.  People who use: ? Illegal drugs. IV heroin is especially dangerous. ? Other substances, including alcohol, while using an opioid.  People who have: ? A history of drug or alcohol abuse. ? Certain mental health conditions. ? A history of previous  drug overdoses.  People who take opioids that are not prescribed for them. What are the signs or symptoms? Symptoms of this condition depend on the type of opioid and the amount that was taken. Common symptoms include:  Sleepiness or difficulty waking from  sleep.  Decrease in attention.  Confusion.  Slurred speech.  Slowed breathing and a slow pulse (bradycardia).  Nausea and vomiting.  Abnormally small pupils. Signs and symptoms that require emergency treatment include:  Cold, clammy, and pale skin.  Blue lips and fingernails.  Vomiting.  Gurgling sounds in the throat.  A pulse that is very slow or difficult to detect.  Breathing that is very irregular, slow, noisy, or difficult to detect.  Limp body.  Inability to respond to speech or be awakened from sleep (stupor).  Seizures. How is this diagnosed? This condition is diagnosed based on your symptoms and medical history. It is important to tell your health care provider:  About all of the opioids that you took.  When you took the opioids.  Whether you were drinking alcohol or using marijuana, cocaine, or other drugs. Your health care provider will do a physical exam. This exam may include:  Checking and monitoring your heart rate and rhythm, breathing rate, temperature, and blood pressure (vital signs).  Measuring oxygen levels in your blood.  Checking for abnormally small pupils. You may also have blood tests or urine tests. You may have X-rays if you are having severe breathing problems. How is this treated? This condition requires immediate medical treatment and hospitalization. Treatment is given in the hospital intensive care (ICU) setting. Supporting your vital signs and your breathing is the first step in treating an opioid overdose. Treatment may also include:  Giving salts and minerals (electrolytes) along with fluids through an IV.  Inserting a breathing tube (endotracheal tube) in your airway to help you breathe if you cannot breathe on your own or you are in danger of not being able to breathe on your own.  Giving oxygen through a small tube under your nose.  Passing a tube through your nose and into your stomach (nasogastric tube, or NG tube) to  empty your stomach.  Giving medicines that: ? Increase your blood pressure. ? Relieve nausea and vomiting. ? Relieve abdominal pain and cramping. ? Reverse the effects of the opioid (naloxone).  Monitoring your heart and oxygen levels.  Ongoing counseling and mental health support if you intentionally overdosed or used an illegal drug. Follow these instructions at home:  Medicines  Take over-the-counter and prescription medicines only as told by your health care provider.  Always ask your health care provider about possible side effects and interactions of any new medicine that you start taking.  Keep a list of all the medicines that you take, including over-the-counter medicines. Bring this list with you to all your medical visits. General instructions  Drink enough fluid to keep your urine pale yellow.  Keep all follow-up visits as told by your health care provider. This is important. How is this prevented?  Read the drug inserts that come with your opioid pain medicines.  Take medicines only as told by your health care provider. Do not take more medicine than you are told. Do not take medicines more frequently than you are told.  Do not drink alcohol or take sedatives when taking opioids.  Do not use illegal or recreational drugs, including cocaine, ecstasy, and marijuana.  Do not take opioid medicines that are not prescribed for you.  Store all medicines in safety containers that are out of the reach of children.  Get help if you are struggling with: ? Alcohol or drug use. ? Depression or another mental health problem. ? Thoughts of hurting yourself or another person.  Keep the phone number of your local poison control center near your phone or in your mobile phone. In the [place] the hotline of the [facility] is 478-033-0676.  If you were prescribed naloxone, make sure you understand how to take it. Contact a health care provider if you:  Need  help understanding how to take your pain medicines.  Feel your medicines are too strong.  Are concerned that your pain medicines are not working well for your pain.  Develop new symptoms or side effects when you are taking medicines. Get help right away if:  You or someone else is having symptoms of an opioid overdose. Get help even if you are not sure.  You have serious thoughts about hurting yourself or others.  You have: ? Chest pain. ? Difficulty breathing. ? A loss of consciousness. These symptoms may represent a serious problem that is an emergency. Do not wait to see if the symptoms will go away. Get medical help right away. Call your local emergency services (911 in the [place] Do not drive yourself to the hospital. If you ever feel like you may hurt yourself or others, or have thoughts about taking your own life, get help right away. You can go to your nearest emergency department or call:  Your local emergency services (911 in the [place]  A suicide crisis helpline, such as the [place] at 229-153-1527. This is open 24 hours a day. Summary  Opioids are drugs that are often used to treat pain. Opioids include illegal drugs, such as heroin, as well as prescription pain medicines.  An opioid overdose happens when you take too much of an opioid.  Overdoses can be intentional or accidental.  Opioid overdose is very dangerous. It is a life-threatening emergency.  If you or someone you know is experiencing an opioid overdose, get help right away. This information is not intended to replace advice given to you by your health care provider. Make sure you discuss any questions you have with your health care provider. Document Revised: 04/19/2018 Document Reviewed: 04/19/2018 [employer] Patient Education  [employer].

## 2020-04-28 NOTE — Progress Notes (Signed)
Safety precautions to be maintained throughout the outpatient stay will include: orient to surroundings, keep bed in low position, maintain call bell within reach at all times, provide assistance with transfer out of bed and ambulation.  

## 2020-04-29 ENCOUNTER — Telehealth: Payer: Self-pay | Admitting: *Deleted

## 2020-04-29 MED FILL — Medication: INTRATHECAL | Qty: 1 | Status: AC

## 2020-04-29 NOTE — Telephone Encounter (Signed)
Spoke to patient after having pump refill on yesterday.  Verbalizes no questions or concerns.

## 2020-05-07 ENCOUNTER — Ambulatory Visit: Payer: Medicare Other | Admitting: Pain Medicine

## 2020-05-07 ENCOUNTER — Other Ambulatory Visit: Payer: Self-pay | Admitting: Pain Medicine

## 2020-05-07 DIAGNOSIS — G8929 Other chronic pain: Secondary | ICD-10-CM | POA: Insufficient documentation

## 2020-05-07 DIAGNOSIS — M47816 Spondylosis without myelopathy or radiculopathy, lumbar region: Secondary | ICD-10-CM | POA: Insufficient documentation

## 2020-05-07 DIAGNOSIS — Z91199 Patient's noncompliance with other medical treatment and regimen due to unspecified reason: Secondary | ICD-10-CM

## 2020-05-07 NOTE — Progress Notes (Addendum)
Interesting that this is a patient that always shows up early to her appointments and she is in a huge hurry to leave.  Today (05/07/2020) she did not show up at all for her diagnostic left lumbar facet medial branch block #2. Patient did not even call to cancel.

## 2020-05-07 NOTE — Progress Notes (Deleted)
Interesting that this is a patient that always shows up early to her appointments and she is in a huge hurry to leave.  Today she did not show up at all for her diagnostic left lumbar facet medial branch block #2.

## 2020-06-08 ENCOUNTER — Other Ambulatory Visit: Payer: Self-pay

## 2020-06-08 MED ORDER — PAIN MANAGEMENT IT PUMP REFILL: 1.0000 | Freq: Once | INTRATHECAL | 0 refills | Status: AC

## 2020-07-07 ENCOUNTER — Encounter: Payer: Medicare Other | Admitting: Pain Medicine

## 2020-07-07 NOTE — Progress Notes (Addendum)
PROVIDER NOTE: Information contained herein reflects review and annotations entered in association with encounter. Interpretation of such information and data should be left to medically-trained personnel. Information provided to patient can be located elsewhere in the medical record under "Patient Instructions". Document created using STT-dictation technology, any transcriptional errors that may result from process are unintentional.    Patient: Tiffany Hunter  Service Category: Procedure  Provider: Gaspar Cola, MD  DOB: April 27, 1946  DOS: 07/08/2020  Location: [facility]  MRN: [id number]  Setting: Ambulatory - outpatient  Referring Provider: Iva Lento, PA-C  Type: Established Patient  Specialty: Interventional Pain Management  PCP: Iva Lento, PA-C   Primary Reason for Visit: Interventional Pain Management Treatment. CC: Back Pain (low)  Procedure:          Intrathecal Drug Delivery System (IDDS):  Type: Reservoir Refill [phone]) No rate change Region: Abdominal Laterality: Left  Type of Pump: Medtronic Synchromed II Delivery Route: Intrathecal Type of Pain Treated: Neuropathic/Nociceptive Primary Medication Class: Opioid/opiate  Medication, Concentration, Infusion Program, & Delivery Rate: Please see scanned programming printout.   Indications: 1. 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 Chronic low back pain (Primary Area of Pain) (Left)   4. Chronic lower extremity pain (Secondary Area of pain) (Left)   5. DDD (degenerative disc disease), lumbosacral   6. 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 Pharmacologic therapy   8. 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 Encounter for adjustment or management of infusion pump    Pain Assessment: Self-Reported Pain Score: 8 /10             Reported level is compatible with observation.         The patient continues  to experience pain in the left lower back which we had determined to be secondary to a lumbar facet syndrome.  However, she did not keep her 05/07/2020 appointment for the lumbar facet block.  Today she comes in asking to see if there is anything that we can do for this pain.  We have reminded her that she needs to keep her appointments.  I will again schedule her for a left-sided lumbar facet block under fluoroscopic guidance and IV sedation, as soon as possible.  Pharmacotherapy Assessment  Analgesic: No other oral opioid analgesics prescribed by our practice. Intrathecal PF-Hydromorphone 3.9295 mg/day + PF-Sufentanil 34.383 mcg/day. MME: Aprox. 58.7 mg/day.   Monitoring: [place] PMP: PDMP reviewed during this encounter.       Pharmacotherapy: No side-effects or adverse reactions reported. Compliance: No problems identified. Effectiveness: Clinically acceptable. Plan: Refer to "POC".  UDS:  Summary  Date Value Ref Range Status  07/30/2019 Note  Final    Comment:    ==================================================================== Compliance Drug Analysis, Ur ==================================================================== Test                             Result       Flag       Units Drug Present not Declared for Prescription Verification   Hydromorphone                  1757         UNEXPECTED ng/mg creat    Hydromorphone may be administered as a scheduled prescription    medication; it is also an expected metabolite of hydrocodone.   Naproxen  PRESENT      UNEXPECTED Drug Absent but Declared for Prescription Verification   Salicylate                     Not Detected UNEXPECTED    Aspirin, as indicated in the declared medication list, is not always    detected even when used as directed. ==================================================================== Test                      Result    Flag   Units      Ref Range   Creatinine              182               mg/dL      >=20 ==================================================================== Declared Medications:  The flagging and interpretation on this report are based on the  following declared medications.  Unexpected results may arise from  inaccuracies in the declared medications.  **Note: The testing scope of this panel does not include small to  moderate amounts of these reported medications:  Aspirin  **Note: The testing scope of this panel does not include the  following reported medications:  Albuterol (Ventolin HFA)  Atorvastatin (Lipitor)  Betamethasone (Lotrisone)  Carvedilol (Coreg)  Cholecalciferol  Clotrimazole (Lotrisone)  Hydrocortisone  Mirabegron (Myrbetriq)  Naloxegol (Movantik)  Oxybutynin (Ditropan)  Supplement ==================================================================== For clinical consultation, please call [phone]. ====================================================================     Intrathecal Pump Therapy Assessment  Manufacturer: Medtronic Synchromed Type: Programmable Volume: 40 mL reservoir MRI compatibility: Yes   Drug content:  Primary Medication Class:Opioid Primary Medication:PF-Hydromorphone (Dilaudid)(7m/mL) Secondary Medication:PF-Sufentanil(70 g/mL) Other Medication:No third medication   Programming:  Type: Simple continuous. See pump readout for details.   Changes:  Medication Change: None at this point Rate Change: No change in rate  Reported side-effects or adverse reactions: None reported  Effectiveness: Described as relatively effective, allowing for increase in activities of daily living (ADL) Clinically meaningful improvement in function (CMIF): Sustained CMIF goals met  Plan: Pump refill today  Pre-op H&P Assessment:  Ms. MWilandis a 75y.o. (year old), female patient, seen today for interventional treatment. She  has a past surgical history that includes Carpal tunnel release  (Bilateral); Back surgery; Breast surgery (Bilateral); Cholecystectomy; Colonoscopy w/ polypectomy; Cataract extraction w/ intraocular lens implant (Right); Abdominal hysterectomy; Fracture surgery (Right); Cardiac catheterization (2012); and Pain pump revision (Left, 07/07/2017). Ms. MForondahas a current medication list which includes the following prescription(s): aspirin ec, atorvastatin, carvedilol, meloxicam, naloxegol oxalate, naloxone, tramadol, benefiber, and albuterol. Her primarily concern today is the Back Pain (low)  Initial Vital Signs:  Pulse/HCG Rate: 73  Temp: (!) 97.1 F (36.2 C) Resp: 18 BP: (!) 155/77 SpO2: 99 %  BMI: Estimated body mass index is 31.09 kg/m as calculated from the following:   Height as of this encounter: _0  (1.575 m).   Weight as of this encounter: 170 lb (77.1 kg).  Risk Assessment: Allergies: Reviewed. She is allergic to doxycycline hyclate and sulfa antibiotics.  Allergy Precautions: None required Coagulopathies: Reviewed. None identified.  Blood-thinner therapy: None at this time Active Infection(s): Reviewed. None identified. Ms. MValvanois afebrile  Site Confirmation: Ms. MAlfredwas asked to confirm the procedure and laterality before marking the site Procedure checklist: Completed Consent: Before the procedure and under the influence of no sedative(s), amnesic(s), or anxiolytics, the patient was informed of the treatment options, risks and possible complications. To fulfill our ethical and legal  obligations, as recommended by the American Medical Association's Code of Ethics, I have informed the patient of my clinical impression; the nature and purpose of the treatment or procedure; the risks, benefits, and possible complications of the intervention; the alternatives, including doing nothing; the risk(s) and benefit(s) of the alternative treatment(s) or procedure(s); and the risk(s) and benefit(s) of doing nothing.  Ms. Courtney was provided  with information about the general risks and possible complications associated with most interventional procedures. These include, but are not limited to: failure to achieve desired goals, infection, bleeding, organ or nerve damage, allergic reactions, paralysis, and/or death.  In addition, she was informed of those risks and possible complications associated to this particular procedure, which include, but are not limited to: damage to the implant; failure to decrease pain; local, systemic, or serious CNS infections, intraspinal abscess with possible cord compression and paralysis, or life-threatening such as meningitis; bleeding; organ damage; nerve injury or damage with subsequent sensory, motor, and/or autonomic system dysfunction, resulting in transient or permanent pain, numbness, and/or weakness of one or several areas of the body; allergic reactions, either minor or major life-threatening, such as anaphylactic or anaphylactoid reactions.  Furthermore, Ms. Nayak was informed of those risks and complications associated with the medications. These include, but are not limited to: allergic reactions (i.e.: anaphylactic or anaphylactoid reactions); endorphine suppression; bradycardia and/or hypotension; water retention and/or peripheral vascular relaxation leading to lower extremity edema and possible stasis ulcers; respiratory depression and/or shortness of breath; decreased metabolic rate leading to weight gain; swelling or edema; medication-induced neural toxicity; particulate matter embolism and blood vessel occlusion with resultant organ, and/or nervous system infarction; and/or intrathecal granuloma formation with possible spinal cord compression and permanent paralysis.  Before refilling the pump Ms. Tollett was informed that some of the medications used in the devise may not be FDA approved for such use and therefore it constitutes an off-label use of the medications.  Finally, she was  informed that Medicine is not an exact science; therefore, there is also the possibility of unforeseen or unpredictable risks and/or possible complications that may result in a catastrophic outcome. The patient indicated having understood very clearly. We have given the patient no guarantees and we have made no promises. Enough time was given to the patient to ask questions, all of which were answered to the patient's satisfaction. Ms. Supak has indicated that she wanted to continue with the procedure. Attestation: I, the ordering provider, attest that I have discussed with the patient the benefits, risks, side-effects, alternatives, likelihood of achieving goals, and potential problems during recovery for the procedure that I have provided informed consent. Date   Time: 07/08/2020  8:51 AM  Pre-Procedure Preparation:  Monitoring: As per clinic protocol. Respiration, ETCO2, SpO2, BP, heart rate and rhythm monitor placed and checked for adequate function Safety Precautions: Patient was assessed for positional comfort and pressure points before starting the procedure. Time-out: I initiated and conducted the "Time-out" before starting the procedure, as per protocol. The patient was asked to participate by confirming the accuracy of the "Time Out" information. Verification of the correct person, site, and procedure were performed and confirmed by me, the nursing staff, and the patient. "Time-out" conducted as per Joint Commission's Universal Protocol (UP.01.01.01). Time: 0910  Description of Procedure:          Position: Supine Target Area: Central-port of intrathecal pump. Approach: Anterior, 90 degree angle approach. Area Prepped: Entire Area around the pump implant. DuraPrep (Iodine Povacrylex [0.7% available iodine] and  Isopropyl Alcohol, 74% w/w) Safety Precautions: Aspiration looking for blood return was conducted prior to all injections. At no point did we inject any substances, as a needle was  being advanced. No attempts were made at seeking any paresthesias. Safe injection practices and needle disposal techniques used. Medications properly checked for expiration dates. SDV (single dose vial) medications used. Description of the Procedure: Protocol guidelines were followed. Two nurses trained to do implant refills were present during the entire procedure. The refill medication was checked by both healthcare providers as well as the patient. The patient was included in the "Time-out" to verify the medication. The patient was placed in position. The pump was identified. The area was prepped in the usual manner. The sterile template was positioned over the pump, making sure the side-port location matched that of the pump. Both, the pump and the template were held for stability. The needle provided in the Medtronic Kit was then introduced thru the center of the template and into the central port. The pump content was aspirated and discarded volume documented. The new medication was slowly infused into the pump, thru the filter, making sure to avoid overpressure of the device. The needle was then removed and the area cleansed, making sure to leave some of the prepping solution back to take advantage of its long term bactericidal properties. The pump was interrogated and programmed to reflect the correct medication, volume, and dosage. The program was printed and taken to the physician for approval. Once checked and signed by the physician, a copy was provided to the patient and another scanned into the EMR. Vitals:   07/08/20 0849 07/08/20 0852  BP:  (!) 155/77  Pulse: 73   Resp: 18   Temp: (!) 97.1 F (36.2 C)   SpO2: 99%   Weight: 170 lb (77.1 kg)   Height: _0  (1.575 m)     Start Time: 0912 hrs. End Time: 0918 hrs. Materials & Medications: Medtronic Refill Kit Medication(s): Please see chart orders for details.  Imaging Guidance:          Type of Imaging Technique: None  used Indication(s): N/A Exposure Time: No patient exposure Contrast: None used. Fluoroscopic Guidance: N/A Ultrasound Guidance: N/A Interpretation: N/A  Antibiotic Prophylaxis:   Anti-infectives (From admission, onward)   None     Indication(s): None identified  Post-operative Assessment:  Post-procedure Vital Signs:  Pulse/HCG Rate: 73  Temp: (!) 97.1 F (36.2 C) Resp: 18 BP: (!) 155/77 SpO2: 99 %  EBL: None  Complications: No immediate post-treatment complications observed by team, or reported by patient.  Note: The patient tolerated the entire procedure well. A repeat set of vitals were taken after the procedure and the patient was kept under observation following institutional policy, for this type of procedure. Post-procedural neurological assessment was performed, showing return to baseline, prior to discharge. The patient was provided with post-procedure discharge instructions, including a section on how to identify potential problems. Should any problems arise concerning this procedure, the patient was given instructions to immediately contact us, at any time, without hesitation. In any case, we plan to contact the patient by telephone for a follow-up status report regarding this interventional procedure.  Comments:  No additional relevant information.  Plan of Care  Orders:  Orders Placed This Encounter  Procedures   PUMP REFILL    Maintain Protocol by having two(2) healthcare providers during procedure and programming.    Scheduling Instructions:     Please refill intrathecal pump today.  Order Specific Question:   Where will this procedure be performed?    Answer:   [facility]   PUMP REFILL    Whenever possible schedule on a procedure today.    Standing Status:   Future    Standing Expiration Date:   12/04/2020    Scheduling Instructions:     Please schedule intrathecal pump refill based on pump programming. Avoid schedule intervals of more than  120 days (4 months).    Order Specific Question:   Where will this procedure be performed?    Answer:   [facility]   LUMBAR FACET(MEDIAL BRANCH NERVE BLOCK) MBNB    Standing Status:   Future    Standing Expiration Date:   08/05/2020    Scheduling Instructions:     Procedure: Lumbar facet block (AKA.: Lumbosacral medial branch nerve block)     Side: Left-sided     Level: L3-4, L4-5, & L5-S1 Facets (L2, L3, L4, L5, & S1 Medial Branch Nerves)     Sedation: Patient's choice.     Timeframe: ASAA    Order Specific Question:   Where will this procedure be performed?    Answer:   [facility]   Informed Consent Details: Physician/Practitioner Attestation; Transcribe to consent form and obtain patient signature    Transcribe to consent form and obtain patient signature.    Order Specific Question:   Physician/Practitioner attestation of informed consent for procedure/surgical case    Answer:   I, the physician/practitioner, attest that I have discussed with the patient the benefits, risks, side effects, alternatives, likelihood of achieving goals and potential problems during recovery for the procedure that I have provided informed consent.    Order Specific Question:   Procedure    Answer:   Intrathecal pump refill    Order Specific Question:   Physician/Practitioner performing the procedure    Answer:   Attending Physician: Kathlen Brunswick. Dossie Arbour, MD & designated trained staff    Order Specific Question:   Indication/Reason    Answer:   Chronic Pain Syndrome (G89.4), presence of an intrathecal pump (Z97.8)   Chronic Opioid Analgesic:  No other oral opioid analgesics prescribed by our practice. Intrathecal PF-Hydromorphone 3.9295 mg/day + PF-Sufentanil 34.383 mcg/day. MME: Aprox. 58.7 mg/day.   Medications ordered for procedure: Meds ordered this encounter  Medications   naloxone (NARCAN) 2 MG/2ML injection    Sig: Inject 1 mL (1 mg total) into the muscle as needed for up to  2 doses (for opioid overdose). Inject content of syringe into thigh muscle. Call 911.    Dispense:  2 mL    Refill:  1    Please teach proper emergency use of device.   Medications administered: Tiffany Hunter had no medications administered during this visit.  See the medical record for exact dosing, route, and time of administration.  Follow-up plan:   Return for Pump Refill (Max:35mo, in addition, Procedure (w/ sedation): (L) L-FCT BLK.       Interventional management options: Planned, scheduled, and/or pending:   Diagnostic left versus bilateral lumbar facet block #2    Considering:   Diagnostic left sided lumbar facet block #2 Possible left-sided lumbar facet RFA   Palliative PRN treatment(s):   Palliative Intrathecal pump refillas per programming  Diagnostic left sided lumbar facet block #2            Recent Visits Date Type Provider Dept  [facility] Procedure visit  Milinda Pointer, MD [facility]  Showing recent visits within past 90 days and meeting all other requirements Today's Visits Date Type Provider Dept  07/08/20 Procedure visit Milinda Pointer, MD [facility]  Showing today's visits and meeting all other requirements Future Appointments Date Type Provider Dept  07/14/20 Appointment Milinda Pointer, MD [facility]  09/10/20 Appointment Milinda Pointer, MD [facility]  Showing future appointments within next 90 days and meeting all other requirements  Disposition: Discharge home  Discharge (Date   Time): 07/08/2020; 0923 hrs.   Primary Care Physician: Iva Lento, PA-C Location: [facility] Note by: Gaspar Cola, MD Date: 07/08/2020; Time: 12:50 PM  Disclaimer:  Medicine is not an Chief Strategy Officer. The only guarantee in medicine is that nothing is guaranteed. It is important to note that the decision  to proceed with this intervention was based on the information collected from the patient. The Data and conclusions were drawn from the patient's questionnaire, the interview, and the physical examination. Because the information was provided in large part by the patient, it cannot be guaranteed that it has not been purposely or unconsciously manipulated. Every effort has been made to obtain as much relevant data as possible for this evaluation. It is important to note that the conclusions that lead to this procedure are derived in large part from the available data. Always take into account that the treatment will also be dependent on availability of resources and existing treatment guidelines, considered by other Pain Management Practitioners as being common knowledge and practice, at the time of the intervention. For Medico-Legal purposes, it is also important to point out that variation in procedural techniques and pharmacological choices are the acceptable norm. The indications, contraindications, technique, and results of the above procedure should only be interpreted and judged by a Board-Certified Interventional Pain Specialist with extensive familiarity and expertise in the same exact procedure and technique.

## 2020-07-07 NOTE — Progress Notes (Deleted)
The patient missed her appointment today and call requesting to change it for tomorrow.

## 2020-07-08 ENCOUNTER — Encounter: Payer: Self-pay | Admitting: Pain Medicine

## 2020-07-08 ENCOUNTER — Ambulatory Visit: Payer: Medicare Other | Attending: Pain Medicine | Admitting: Pain Medicine

## 2020-07-08 ENCOUNTER — Other Ambulatory Visit: Payer: Self-pay

## 2020-07-08 VITALS — BP 155/77 | HR 73 | Temp 97.1°F | Resp 18 | Ht 62.0 in | Wt 170.0 lb

## 2020-07-08 DIAGNOSIS — G8929 Other chronic pain: Secondary | ICD-10-CM

## 2020-07-08 DIAGNOSIS — Z79899 Other long term (current) drug therapy: Secondary | ICD-10-CM | POA: Diagnosis not present

## 2020-07-08 DIAGNOSIS — M79606 Pain in leg, unspecified: Secondary | ICD-10-CM | POA: Diagnosis not present

## 2020-07-08 DIAGNOSIS — Z86010 Personal history of colonic polyps: Secondary | ICD-10-CM | POA: Diagnosis not present

## 2020-07-08 DIAGNOSIS — M47816 Spondylosis without myelopathy or radiculopathy, lumbar region: Secondary | ICD-10-CM

## 2020-07-08 DIAGNOSIS — Z9049 Acquired absence of other specified parts of digestive tract: Secondary | ICD-10-CM | POA: Insufficient documentation

## 2020-07-08 DIAGNOSIS — M5442 Lumbago with sciatica, left side: Secondary | ICD-10-CM

## 2020-07-08 DIAGNOSIS — Z451 Encounter for adjustment and management of infusion pump: Secondary | ICD-10-CM | POA: Diagnosis not present

## 2020-07-08 DIAGNOSIS — Z79891 Long term (current) use of opiate analgesic: Secondary | ICD-10-CM | POA: Diagnosis not present

## 2020-07-08 DIAGNOSIS — G894 Chronic pain syndrome: Secondary | ICD-10-CM

## 2020-07-08 DIAGNOSIS — Z882 Allergy status to sulfonamides status: Secondary | ICD-10-CM | POA: Insufficient documentation

## 2020-07-08 DIAGNOSIS — Z791 Long term (current) use of non-steroidal anti-inflammatories (NSAID): Secondary | ICD-10-CM | POA: Diagnosis not present

## 2020-07-08 DIAGNOSIS — Z90710 Acquired absence of both cervix and uterus: Secondary | ICD-10-CM | POA: Insufficient documentation

## 2020-07-08 DIAGNOSIS — M5137 Other intervertebral disc degeneration, lumbosacral region: Secondary | ICD-10-CM | POA: Diagnosis not present

## 2020-07-08 DIAGNOSIS — M4726 Other spondylosis with radiculopathy, lumbar region: Secondary | ICD-10-CM | POA: Diagnosis not present

## 2020-07-08 DIAGNOSIS — F1190 Opioid use, unspecified, uncomplicated: Secondary | ICD-10-CM

## 2020-07-08 DIAGNOSIS — M961 Postlaminectomy syndrome, not elsewhere classified: Secondary | ICD-10-CM

## 2020-07-08 DIAGNOSIS — Z7982 Long term (current) use of aspirin: Secondary | ICD-10-CM | POA: Diagnosis not present

## 2020-07-08 DIAGNOSIS — Z978 Presence of other specified devices: Secondary | ICD-10-CM

## 2020-07-08 DIAGNOSIS — M51379 Other intervertebral disc degeneration, lumbosacral region without mention of lumbar back pain or lower extremity pain: Secondary | ICD-10-CM

## 2020-07-08 MED ORDER — NALOXONE HCL 2 MG/2ML IJ SOSY
1.0000 mg | PREFILLED_SYRINGE | INTRAMUSCULAR | 1 refills | Status: DC | PRN
Start: 2020-07-08 — End: 2020-09-10

## 2020-07-08 NOTE — Progress Notes (Signed)
Safety precautions to be maintained throughout the outpatient stay will include: orient to surroundings, keep bed in low position, maintain call bell within reach at all times, provide assistance with transfer out of bed and ambulation.  

## 2020-07-08 NOTE — Addendum Note (Signed)
Addended by: Milinda Pointer A on: 07/08/2020 12:51 PM   Modules accepted: Orders

## 2020-07-08 NOTE — Patient Instructions (Addendum)
Opioid Overdose Opioids are drugs that are often used to treat pain. Opioids include illegal drugs, such as heroin, as well as prescription pain medicines, such as codeine, morphine, hydrocodone, oxycodone, and fentanyl. An opioid overdose happens when you take too much of an opioid. An overdose may be intentional or accidental and can happen with any type of opioid. The effects of an overdose can be mild, dangerous, or even deadly. Opioid overdose is a medical emergency. What are the causes? This condition may be caused by:  Taking too much of an opioid on purpose.  Taking too much of an opioid by accident.  Using two or more substances that contain opioids at the same time.  Taking an opioid with a substance that affects your heart, breathing, or blood pressure. These include alcohol, tranquilizers, sleeping pills, illegal drugs, and some over-the-counter medicines. This condition may also happen due to an error made by:  A health care provider who prescribes a medicine.  The pharmacist who fills the prescription order. What increases the risk? This condition is more likely in:  Children. They may be attracted to colorful pills. Because of a child's small size, even a small amount of a drug can be dangerous.  Older people. They may be taking many different drugs. Older people may have difficulty reading labels or remembering when they last took their medicine. They may also be more sensitive to the effects of opioids.  People with chronic medical conditions, especially heart, liver, kidney, or neurological diseases.  People who take an opioid for a long period of time.  People who use: ? Illegal drugs. IV heroin is especially dangerous. ? Other substances, including alcohol, while using an opioid.  People who have: ? A history of drug or alcohol abuse. ? Certain mental health conditions. ? A history of previous drug overdoses.  People who take opioids that are not prescribed  for them. What are the signs or symptoms? Symptoms of this condition depend on the type of opioid and the amount that was taken. Common symptoms include:  Sleepiness or difficulty waking from sleep.  Decrease in attention.  Confusion.  Slurred speech.  Slowed breathing and a slow pulse (bradycardia).  Nausea and vomiting.  Abnormally small pupils. Signs and symptoms that require emergency treatment include:  Cold, clammy, and pale skin.  Blue lips and fingernails.  Vomiting.  Gurgling sounds in the throat.  A pulse that is very slow or difficult to detect.  Breathing that is very irregular, slow, noisy, or difficult to detect.  Limp body.  Inability to respond to speech or be awakened from sleep (stupor).  Seizures. How is this diagnosed? This condition is diagnosed based on your symptoms and medical history. It is important to tell your health care provider:  About all of the opioids that you took.  When you took the opioids.  Whether you were drinking alcohol or using marijuana, cocaine, or other drugs. Your health care provider will do a physical exam. This exam may include:  Checking and monitoring your heart rate and rhythm, breathing rate, temperature, and blood pressure (vital signs).  Measuring oxygen levels in your blood.  Checking for abnormally small pupils. You may also have blood tests or urine tests. You may have X-rays if you are having severe breathing problems. How is this treated? This condition requires immediate medical treatment and hospitalization. Treatment is given in the hospital intensive care (ICU) setting. Supporting your vital signs and your breathing is the first step in  treating an opioid overdose. Treatment may also include:  Giving salts and minerals (electrolytes) along with fluids through an IV.  Inserting a breathing tube (endotracheal tube) in your airway to help you breathe if you cannot breathe on your own or you are in  danger of not being able to breathe on your own.  Giving oxygen through a small tube under your nose.  Passing a tube through your nose and into your stomach (nasogastric tube, or NG tube) to empty your stomach.  Giving medicines that: ? Increase your blood pressure. ? Relieve nausea and vomiting. ? Relieve abdominal pain and cramping. ? Reverse the effects of the opioid (naloxone).  Monitoring your heart and oxygen levels.  Ongoing counseling and mental health support if you intentionally overdosed or used an illegal drug. Follow these instructions at home: Medicines  Take over-the-counter and prescription medicines only as told by your health care provider.  Always ask your health care provider about possible side effects and interactions of any new medicine that you start taking.  Keep a list of all the medicines that you take, including over-the-counter medicines. Bring this list with you to all your medical visits. General instructions  Drink enough fluid to keep your urine pale yellow.  Keep all follow-up visits as told by your health care provider. This is important.   How is this prevented?  Read the drug inserts that come with your opioid pain medicines.  Take medicines only as told by your health care provider. Do not take more medicine than you are told. Do not take medicines more frequently than you are told.  Do not drink alcohol or take sedatives when taking opioids.  Do not use illegal or recreational drugs, including cocaine, ecstasy, and marijuana.  Do not take opioid medicines that are not prescribed for you.  Store all medicines in safety containers that are out of the reach of children.  Get help if you are struggling with: ? Alcohol or drug use. ? Depression or another mental health problem. ? Thoughts of hurting yourself or another person.  Keep the phone number of your local poison control center near your phone or in your mobile phone. In the  [place] the hotline of the National Poison Control Center is (800) 222-1222.  If you were prescribed naloxone, make sure you understand how to take it. Contact a health care provider if you:  Need help understanding how to take your pain medicines.  Feel your medicines are too strong.  Are concerned that your pain medicines are not working well for your pain.  Develop new symptoms or side effects when you are taking medicines. Get help right away if:  You or someone else is having symptoms of an opioid overdose. Get help even if you are not sure.  You have serious thoughts about hurting yourself or others.  You have: ? Chest pain. ? Difficulty breathing. ? A loss of consciousness. These symptoms may represent a serious problem that is an emergency. Do not wait to see if the symptoms will go away. Get medical help right away. Call your local emergency services (911 in the [place] Do not drive yourself to the hospital. If you ever feel like you may hurt yourself or others, or have thoughts about taking your own life, get help right away. You can go to your nearest emergency department or call:  Your local emergency services (911 in the [place]  A suicide crisis helpline, such as the National Suicide Prevention Lifeline   at 779-419-1216. This is open 24 hours a day. Summary  Opioids are drugs that are often used to treat pain. Opioids include illegal drugs, such as heroin, as well as prescription pain medicines.  An opioid overdose happens when you take too much of an opioid.  Overdoses can be intentional or accidental.  Opioid overdose is very dangerous. It is a life-threatening emergency.  If you or someone you know is experiencing an opioid overdose, get help right away. This information is not intended to replace advice given to you by your health care provider. Make sure you discuss any questions you have with your health care provider. Document Revised: 04/19/2018 Document  Reviewed: 04/19/2018 Elsevier Patient Education  2021 [place]. Naloxone nasal spray What is this medicine? NALOXONE (nal OX one) is a narcotic blocker. It is used to treat narcotic drug overdose. This medicine may be used for other purposes; ask your health care provider or pharmacist if you have questions. COMMON BRAND NAME(S): Kloxxado, Narcan What should I tell my health care provider before I take this medicine? They need to know if you have any of these conditions:  drug abuse or addiction  heart disease  an unusual or allergic reaction to naloxone, other medicines, foods, dyes, or preservatives  pregnant or trying to get pregnant  breast-feeding How should I use this medicine? This medicine is for use in the nose. Lay the person on their back. Support their neck with your hand and allow the head to tilt back before giving the medicine. The nasal spray should be given into 1 nostril. After giving the medicine, move the person onto their side. Do not remove or test the nasal spray until ready to use. Get emergency medical help right away after giving the first dose of this medicine, even if the person wakes up. You should be familiar with how to recognize the signs and symptoms of a narcotic overdose. If more doses are needed, give the additional dose in the other nostril. Talk to your pediatrician regarding the use of this medicine in children. While this drug may be prescribed for children as young as newborns for selected conditions, precautions do apply. Overdosage: If you think you have taken too much of this medicine contact a poison control center or emergency room at once. NOTE: This medicine is only for you. Do not share this medicine with others. What if I miss a dose? This does not apply. What may interact with this medicine? This medicine is only used during an emergency. No interactions are expected during emergency use. This list may not describe all possible  interactions. Give your health care provider a list of all the medicines, herbs, non-prescription drugs, or dietary supplements you use. Also tell them if you smoke, drink alcohol, or use illegal drugs. Some items may interact with your medicine. What should I watch for while using this medicine? Keep this medicine ready for use in the case of a narcotic overdose. Make sure that you have the phone number of your doctor or health care professional and local hospital ready. You may need to have additional doses of this medicine. Each nasal spray contains a single dose. Some emergencies may require additional doses. After use, bring the treated person to the nearest hospital or call 911. Make sure the treating health care professional knows that the person has received an injection of this medicine. You will receive additional instructions on what to do during and after use of this medicine before an  emergency occurs. What side effects may I notice from receiving this medicine? Side effects that you should report to your doctor or health care professional as soon as possible:  allergic reactions like skin rash, itching or hives, swelling of the face, lips, or tongue  breathing problems  fast, irregular heartbeat  high blood pressure  pain that was controlled by narcotic pain medicine  seizures Side effects that usually do not require medical attention (report to your doctor or health care professional if they continue or are bothersome):  anxious  chills  diarrhea  fever  headache  muscle pain  nausea, vomiting  nose irritation like dryness, congestion, and swelling  sweating This list may not describe all possible side effects. Call your doctor for medical advice about side effects. You may report side effects to FDA at 1-800-FDA-1088. Where should I keep my medicine? Keep out of the reach of children and pets. Store between 20 and 25 degrees C (68 and 77 degrees F). Do not  freeze. Throw away any unused medicine after the expiration date. Keep in original box until ready to use. NOTE: This sheet is a summary. It may not cover all possible information. If you have questions about this medicine, talk to your doctor, pharmacist, or health care provider.  2021 Elsevier/Gold Standard (2019-09-17 12:08:55) Naloxone injection What is this medicine? NALOXONE (nal OX one) is a narcotic blocker. It is used to treat narcotic (opioid) drug overdose. It is used to temporarily reverse the effects of opioid medicines. This medicine has no effect in people who are not taking opioid medicines. This medicine may be used for other purposes; ask your health care provider or pharmacist if you have questions. COMMON BRAND NAME(S): EVZIO, Narcan What should I tell my health care provider before I take this medicine? They need to know if you have any of these conditions:  drug abuse or addiction  heart disease  an unusual or allergic reaction to naloxone, other medicines, foods, dyes, or preservatives  pregnant or trying to get pregnantbreast-feeding How should I use this medicine? This medicine may be administered in a hospital, clinic, or can be used by the public to give aid to a person who has overdosed until emergency medical help is available. This medicine is for injection into the outer thigh. It can be injected through clothing if needed. Get emergency medical help right away after giving the first dose of this medicine, even if the person wakes up. You should be familiar with how to recognize the signs and symptoms of a narcotic overdose. Administer according to the printed instructions on the device label or the electronic voice instructions. You should practice using the Trainer injector before this medicine is needed. Talk to your pediatrician regarding the use of this medicine in children. While this drug may be prescribed for children as young as newborn for selected  conditions, precautions do apply. For infants less than 1 year of age, pinch the thigh muscle while administering. Overdosage: If you think you have taken too much of this medicine contact a poison control center or emergency room at once. NOTE: This medicine is only for you. Do not share this medicine with others. What if I miss a dose? This does not apply. What may interact with this medicine? This medicine is only used during an emergency. No interactions are expected during emergency use. This list may not describe all possible interactions. Give your health care provider a list of all the medicines, herbs, non-prescription  drugs, or dietary supplements you use. Also tell them if you smoke, drink alcohol, or use illegal drugs. Some items may interact with your medicine. What should I watch for while using this medicine? Keep this medicine ready for use in the case of a narcotic overdose. Make sure that you have the phone number of your doctor or health care professional and local hospital ready. You may need to have additional doses of this medicine. Each injector contains a single dose. Some emergencies may require additional doses. After use, bring the treated person to the nearest hospital or call 911. Make sure the treating health care professional knows that the person has received an injection of this medicine. You will receive additional instructions on what to do during and after use of this medicine before an emergency occurs. What side effects may I notice from receiving this medicine? Side effects that you should report to your doctor or health care professional as soon as possible:  allergic reactions like skin rash, itching or hives, swelling of the face, lips, or tongue  breathing problems  fast, irregular heartbeat  high blood pressure  pain that was controlled by narcotic pain medicine  seizures Side effects that usually do not require medical attention (report to your  doctor or health care professional if they continue or are bothersome):  anxious  chills  diarrhea  fever  nausea, vomiting  sweating This list may not describe all possible side effects. Call your doctor for medical advice about side effects. You may report side effects to FDA at 1-800-FDA-1088. Where should I keep my medicine? Keep out of the reach of children. Store at room temperature between 15 and 25 degrees C (59 and 77 degrees F). If you are using this medicine at home, you will be instructed on how to store this medicine. Keep this medicine in its outer case until ready to use. Occasionally check the solution through the viewing window of the injector. The solution should be clear. If it is discolored, cloudy, or contains solid particles, replace it with a new injector. Remember to check the expiration date of this medicine regularly. Throw away any unused medicine after the expiration date. NOTE: This sheet is a summary. It may not cover all possible information. If you have questions about this medicine, talk to your doctor, pharmacist, or health care provider.  2021 Elsevier/Gold Standard (2015-05-12 16:45:38)   ____________________________________________________________________________________________  Preparing for Procedure with Sedation  Procedure appointments are limited to planned procedures: . No Prescription Refills. . No disability issues will be discussed. . No medication changes will be discussed.  Instructions: . Oral Intake: Do not eat or drink anything for at least 8 hours prior to your procedure. (Exception: Blood Pressure Medication. See below.) . Transportation: Unless otherwise stated by your physician, you may drive yourself after the procedure. . Blood Pressure Medicine: Do not forget to take your blood pressure medicine with a sip of water the morning of the procedure. If your Diastolic (lower reading)is above 100 mmHg, elective cases will be  cancelled/rescheduled. . Blood thinners: These will need to be stopped for procedures. Notify our staff if you are taking any blood thinners. Depending on which one you take, there will be specific instructions on how and when to stop it. . Diabetics on insulin: Notify the staff so that you can be scheduled 1st case in the morning. If your diabetes requires high dose insulin, take only  of your normal insulin dose the morning of the procedure  and notify the staff that you have done so. . Preventing infections: Shower with an antibacterial soap the morning of your procedure. . Build-up your immune system: Take 1000 mg of Vitamin C with every meal (3 times a day) the day prior to your procedure. Marland Kitchen Antibiotics: Inform the staff if you have a condition or reason that requires you to take antibiotics before dental procedures. . Pregnancy: If you are pregnant, call and cancel the procedure. . Sickness: If you have a cold, fever, or any active infections, call and cancel the procedure. . Arrival: You must be in the facility at least 30 minutes prior to your scheduled procedure. . Children: Do not bring children with you. . Dress appropriately: Bring dark clothing that you would not mind if they get stained. . Valuables: Do not bring any jewelry or valuables.  Reasons to call and reschedule or cancel your procedure: (Following these recommendations will minimize the risk of a serious complication.) . Surgeries: Avoid having procedures within 2 weeks of any surgery. (Avoid for 2 weeks before or after any surgery). . Flu Shots: Avoid having procedures within 2 weeks of a flu shots or . (Avoid for 2 weeks before or after immunizations). . Barium: Avoid having a procedure within 7-10 days after having had a radiological study involving the use of radiological contrast. (Myelograms, Barium swallow or enema study). . Heart attacks: Avoid any elective procedures or surgeries for the initial 6 months after a  "Myocardial Infarction" (Heart Attack). . Blood thinners: It is imperative that you stop these medications before procedures. Let us know if you if you take any blood thinner.  . Infection: Avoid procedures during or within two weeks of an infection (including chest colds or gastrointestinal problems). Symptoms associated with infections include: Localized redness, fever, chills, night sweats or profuse sweating, burning sensation when voiding, cough, congestion, stuffiness, runny nose, sore throat, diarrhea, nausea, vomiting, cold or Flu symptoms, recent or current infections. It is specially important if the infection is over the area that we intend to treat. Marland Kitchen Heart and lung problems: Symptoms that may suggest an active cardiopulmonary problem include: cough, chest pain, breathing difficulties or shortness of breath, dizziness, ankle swelling, uncontrolled high or unusually low blood pressure, and/or palpitations. If you are experiencing any of these symptoms, cancel your procedure and contact your primary care physician for an evaluation.  Remember:  Regular Business hours are:  Monday to Thursday 8:00 AM to 4:00 PM  Provider's Schedule: Milinda Pointer, MD:  Procedure days: Tuesday and Thursday 7:30 AM to 4:00 PM  Gillis Santa, MD:  Procedure days: Monday and Wednesday 7:30 AM to 4:00 PM ____________________________________________________________________________________________   Preparing for Procedure with Sedation Instructions: . Oral Intake: Do not eat or drink anything for at least 8 hours prior to your procedure. . Transportation: Public transportation is not allowed. Bring an adult driver. The driver must be physically present in our waiting room before any procedure can be started. Marland Kitchen Physical Assistance: Bring an adult capable of physically assisting you, in the event you need help. . Blood Pressure Medicine: Take your blood pressure medicine with a sip of water the morning of  the procedure. . Insulin: Take only  of your normal insulin dose. . Preventing infections: Shower with an antibacterial soap the morning of your procedure. . Build-up your immune system: Take 1000 mg of Vitamin C with every meal (3 times a day) the day prior to your procedure. . Pregnancy: If you are pregnant, call and  cancel the procedure. . Sickness: If you have a cold, fever, or any active infections, call and cancel the procedure. . Arrival: You must be in the facility at least 30 minutes prior to your scheduled procedure. . Children: Do not bring children with you. . Dress appropriately: Bring dark clothing that you would not mind if they get stained. . Valuables: Do not bring any jewelry or valuables. Procedure appointments are reserved for interventional treatments only. Marland Kitchen No Prescription Refills. . No medication changes will be discussed during procedure appointments. . No disability issues will be discussed. Marland Kitchen

## 2020-07-09 ENCOUNTER — Telehealth: Payer: Self-pay

## 2020-07-09 NOTE — Telephone Encounter (Signed)
Post procedure phone call follow up.  LM ?

## 2020-07-14 ENCOUNTER — Ambulatory Visit
Admission: RE | Admit: 2020-07-14 | Discharge: 2020-07-14 | Disposition: A | Discharge status: Home / Self Care | Payer: Medicare Other | Source: Ambulatory Visit | Attending: Pain Medicine | Admitting: Pain Medicine

## 2020-07-14 ENCOUNTER — Encounter: Payer: Self-pay | Admitting: Pain Medicine

## 2020-07-14 ENCOUNTER — Ambulatory Visit (HOSPITAL_BASED_OUTPATIENT_CLINIC_OR_DEPARTMENT_OTHER): Payer: Medicare Other | Admitting: Pain Medicine

## 2020-07-14 ENCOUNTER — Other Ambulatory Visit: Payer: Self-pay

## 2020-07-14 VITALS — BP 152/81 | HR 73 | Temp 97.3°F | Resp 15 | Ht 63.0 in | Wt 170.0 lb

## 2020-07-14 DIAGNOSIS — G8929 Other chronic pain: Secondary | ICD-10-CM

## 2020-07-14 DIAGNOSIS — M47817 Spondylosis without myelopathy or radiculopathy, lumbosacral region: Secondary | ICD-10-CM | POA: Insufficient documentation

## 2020-07-14 DIAGNOSIS — Z978 Presence of other specified devices: Secondary | ICD-10-CM

## 2020-07-14 DIAGNOSIS — R208 Other disturbances of skin sensation: Secondary | ICD-10-CM | POA: Insufficient documentation

## 2020-07-14 DIAGNOSIS — M47816 Spondylosis without myelopathy or radiculopathy, lumbar region: Secondary | ICD-10-CM | POA: Diagnosis present

## 2020-07-14 DIAGNOSIS — M51379 Other intervertebral disc degeneration, lumbosacral region without mention of lumbar back pain or lower extremity pain: Secondary | ICD-10-CM

## 2020-07-14 DIAGNOSIS — M545 Low back pain, unspecified: Secondary | ICD-10-CM

## 2020-07-14 DIAGNOSIS — M5137 Other intervertebral disc degeneration, lumbosacral region: Secondary | ICD-10-CM | POA: Insufficient documentation

## 2020-07-14 DIAGNOSIS — M8580 Other specified disorders of bone density and structure, unspecified site: Secondary | ICD-10-CM

## 2020-07-14 MED ORDER — LIDOCAINE HCL (PF) 2 % IJ SOLN
INTRAMUSCULAR | Status: AC
  Filled 2020-07-14: qty 10

## 2020-07-14 MED ORDER — FENTANYL CITRATE (PF) 100 MCG/2ML IJ SOLN
25.0000 ug | INTRAMUSCULAR | Status: DC | PRN
  Administered 2020-07-14: 50 ug via INTRAVENOUS

## 2020-07-14 MED ORDER — LACTATED RINGERS IV SOLN
1000.0000 mL | Freq: Once | INTRAVENOUS | Status: AC
  Administered 2020-07-14: 1000 mL via INTRAVENOUS

## 2020-07-14 MED ORDER — LIDOCAINE HCL 2 % IJ SOLN
20.0000 mL | Freq: Once | INTRAMUSCULAR | Status: AC
  Administered 2020-07-14: 200 mg
  Filled 2020-07-14: qty 20

## 2020-07-14 MED ORDER — MIDAZOLAM HCL 5 MG/5ML IJ SOLN
INTRAMUSCULAR | Status: AC
  Filled 2020-07-14: qty 5

## 2020-07-14 MED ORDER — MIDAZOLAM HCL 5 MG/5ML IJ SOLN
1.0000 mg | INTRAMUSCULAR | Status: DC | PRN
  Administered 2020-07-14: 1 mg via INTRAVENOUS

## 2020-07-14 MED ORDER — ROPIVACAINE HCL 2 MG/ML IJ SOLN
INTRAMUSCULAR | Status: AC
  Filled 2020-07-14: qty 10

## 2020-07-14 MED ORDER — ROPIVACAINE HCL 2 MG/ML IJ SOLN
9.0000 mL | Freq: Once | INTRAMUSCULAR | Status: AC
  Administered 2020-07-14: 9 mL via PERINEURAL

## 2020-07-14 MED ORDER — TRIAMCINOLONE ACETONIDE 40 MG/ML IJ SUSP
40.0000 mg | Freq: Once | INTRAMUSCULAR | Status: AC
  Administered 2020-07-14: 40 mg

## 2020-07-14 MED ORDER — TRIAMCINOLONE ACETONIDE 40 MG/ML IJ SUSP
INTRAMUSCULAR | Status: AC
  Filled 2020-07-14: qty 1

## 2020-07-14 MED ORDER — FENTANYL CITRATE (PF) 100 MCG/2ML IJ SOLN
INTRAMUSCULAR | Status: AC
  Filled 2020-07-14: qty 2

## 2020-07-14 NOTE — Progress Notes (Signed)
Safety precautions to be maintained throughout the outpatient stay will include: orient to surroundings, keep bed in low position, maintain call bell within reach at all times, provide assistance with transfer out of bed and ambulation.  

## 2020-07-14 NOTE — Patient Instructions (Signed)

## 2020-07-14 NOTE — Progress Notes (Signed)
PROVIDER NOTE: Information contained herein reflects review and annotations entered in association with encounter. Interpretation of such information and data should be left to medically-trained personnel. Information provided to patient can be located elsewhere in the medical record under "Patient Instructions". Document created using STT-dictation technology, any transcriptional errors that may result from process are unintentional.    Patient: Tiffany Hunter  Service Category: Procedure  Provider: Gaspar Cola, MD  DOB: 25-Mar-1946  DOS: 07/14/2020  Location: [facility]  MRN: [id number]  Setting: Ambulatory - outpatient  Referring Provider: Iva Lento, PA-C  Type: Established Patient  Specialty: Interventional Pain Management  PCP: Iva Lento, PA-C   Primary Reason for Visit: Interventional Pain Management Treatment. CC: Back Pain (Back pain)  Procedure:          Anesthesia, Analgesia, Anxiolysis:  Type: Lumbar Facet, Medial Branch Block(s) #2  Primary Purpose: Diagnostic Region: Posterolateral Lumbosacral Spine Level: L2, L3, L4, L5, & S1 Medial Branch Level(s). Injecting these levels blocks the L3-4, L4-5, and L5-S1 lumbar facet joints. Laterality: Left  Type: Moderate (Conscious) Sedation combined with Local Anesthesia Indication(s): Analgesia and Anxiety Route: Intravenous (IV) IV Access: Secured Sedation: Meaningful verbal contact was maintained at all times during the procedure  Local Anesthetic: Lidocaine 1-2%  Position: Prone   Indications: 1. Lumbar facet joint syndrome (Left)   2. Spondylosis without myelopathy or radiculopathy, lumbosacral region   3. DDD (degenerative disc disease), lumbosacral   4. Chronic low back pain (Left) w/o sciatica   5. Presence of intrathecal pump ([employer]onic programmable intrathecal pump)    Pain Score: Pre-procedure: 9 /10 Post-procedure: 0-No pain/10   Today I asked the patient if she was taking vitamin  D on a regular basis since I could see osteopenia of the lumbar spine during fluoroscopy performed for the interventional procedure.  In addition, while I was injecting a small amount of lidocaine subcutaneously for the procedure, the patient was almost coming off of the bed indicating that it was very painful.  This tells me that the patient currently has absolutely no tolerance to any degree of discomfort, but it also suggest that the patient may have hyperalgesia which could be related to vitamin D insufficiency.  She indicated that she does not take any vitamin D.  Today and recommended that she start taking that vitamin D at at least 5000 IU daily.  Pre-op H&P Assessment:  Tiffany Hunter is a 75 y.o. (year old), female patient, seen today for interventional treatment. She  has a past surgical history that includes Carpal tunnel release (Bilateral); Back surgery; Breast surgery (Bilateral); Cholecystectomy; Colonoscopy w/ polypectomy; Cataract extraction w/ intraocular lens implant (Right); Abdominal hysterectomy; Fracture surgery (Right); Cardiac catheterization (2012); and Pain pump revision (Left, 07/07/2017). Tiffany Hunter has a current medication list which includes the following prescription(s): aspirin ec, atorvastatin, carvedilol, meloxicam, naloxone, tramadol, albuterol, naloxegol oxalate, and benefiber, and the following Facility-Administered Medications: fentanyl and midazolam. Her primarily concern today is the Back Pain (Back pain)  Initial Vital Signs:  Pulse/HCG Rate: 73ECG Heart Rate: 85 Temp: (!) 97.2 F (36.2 C) Resp: 12 BP: (!) 181/90 SpO2: 95 %  BMI: Estimated body mass index is 30.11 kg/m as calculated from the following:   Height as of this encounter: 5\' 3"  (1.6 m).   Weight as of this encounter: 170 lb (77.1 kg).  Risk Assessment: Allergies: Reviewed. She is allergic to doxycycline hyclate and sulfa antibiotics.  Allergy Precautions: None required Coagulopathies: Reviewed.  None identified.  Blood-thinner therapy:  None at this time Active Infection(s): Reviewed. None identified. Tiffany Hunter is afebrile  Site Confirmation: Tiffany Hunter was asked to confirm the procedure and laterality before marking the site Procedure checklist: Completed Consent: Before the procedure and under the influence of no sedative(s), amnesic(s), or anxiolytics, the patient was informed of the treatment options, risks and possible complications. To fulfill our ethical and legal obligations, as recommended by the American Medical Association's Code of Ethics, I have informed the patient of my clinical impression; the nature and purpose of the treatment or procedure; the risks, benefits, and possible complications of the intervention; the alternatives, including doing nothing; the risk(s) and benefit(s) of the alternative treatment(s) or procedure(s); and the risk(s) and benefit(s) of doing nothing. The patient was provided information about the general risks and possible complications associated with the procedure. These may include, but are not limited to: failure to achieve desired goals, infection, bleeding, organ or nerve damage, allergic reactions, paralysis, and death. In addition, the patient was informed of those risks and complications associated to Spine-related procedures, such as failure to decrease pain; infection (i.e.: Meningitis, epidural or intraspinal abscess); bleeding (i.e.: epidural hematoma, subarachnoid hemorrhage, or any other type of intraspinal or peri-dural bleeding); organ or nerve damage (i.e.: Any type of peripheral nerve, nerve root, or spinal cord injury) with subsequent damage to sensory, motor, and/or autonomic systems, resulting in permanent pain, numbness, and/or weakness of one or several areas of the body; allergic reactions; (i.e.: anaphylactic reaction); and/or death. Furthermore, the patient was informed of those risks and complications associated with the  medications. These include, but are not limited to: allergic reactions (i.e.: anaphylactic or anaphylactoid reaction(s)); adrenal axis suppression; blood sugar elevation that in diabetics may result in ketoacidosis or comma; water retention that in patients with history of congestive heart failure may result in shortness of breath, pulmonary edema, and decompensation with resultant heart failure; weight gain; swelling or edema; medication-induced neural toxicity; particulate matter embolism and blood vessel occlusion with resultant organ, and/or nervous system infarction; and/or aseptic necrosis of one or more joints. Finally, the patient was informed that Medicine is not an exact science; therefore, there is also the possibility of unforeseen or unpredictable risks and/or possible complications that may result in a catastrophic outcome. The patient indicated having understood very clearly. We have given the patient no guarantees and we have made no promises. Enough time was given to the patient to ask questions, all of which were answered to the patient's satisfaction. Tiffany Hunter has indicated that she wanted to continue with the procedure. Attestation: I, the ordering provider, attest that I have discussed with the patient the benefits, risks, side-effects, alternatives, likelihood of achieving goals, and potential problems during recovery for the procedure that I have provided informed consent. Date  Time: 07/14/2020  8:44 AM  Pre-Procedure Preparation:  Monitoring: As per clinic protocol. Respiration, ETCO2, SpO2, BP, heart rate and rhythm monitor placed and checked for adequate function Safety Precautions: Patient was assessed for positional comfort and pressure points before starting the procedure. Time-out: I initiated and conducted the "Time-out" before starting the procedure, as per protocol. The patient was asked to participate by confirming the accuracy of the "Time Out" information. Verification  of the correct person, site, and procedure were performed and confirmed by me, the nursing staff, and the patient. "Time-out" conducted as per Joint Commission's Universal Protocol (UP.01.01.01). Time: 0926  Description of Procedure:          Laterality: Left Levels:  L2,  L3, L4, L5, & S1 Medial Branch Level(s) Area Prepped: Posterior Lumbosacral Region DuraPrep (Iodine Povacrylex [0.7% available iodine] and Isopropyl Alcohol, 74% w/w) Safety Precautions: Aspiration looking for blood return was conducted prior to all injections. At no point did we inject any substances, as a needle was being advanced. Before injecting, the patient was told to immediately notify me if she was experiencing any new onset of "ringing in the ears, or metallic taste in the mouth". No attempts were made at seeking any paresthesias. Safe injection practices and needle disposal techniques used. Medications properly checked for expiration dates. SDV (single dose vial) medications used. After the completion of the procedure, all disposable equipment used was discarded in the proper designated medical waste containers. Local Anesthesia: Protocol guidelines were followed. The patient was positioned over the fluoroscopy table. The area was prepped in the usual manner. The time-out was completed. The target area was identified using fluoroscopy. A 12-in long, straight, sterile hemostat was used with fluoroscopic guidance to locate the targets for each level blocked. Once located, the skin was marked with an approved surgical skin marker. Once all sites were marked, the skin (epidermis, dermis, and hypodermis), as well as deeper tissues (fat, connective tissue and muscle) were infiltrated with a small amount of a short-acting local anesthetic, loaded on a 10cc syringe with a 25G, 1.5-in  Needle. An appropriate amount of time was allowed for local anesthetics to take effect before proceeding to the next step. Local Anesthetic: Lidocaine  2.0% The unused portion of the local anesthetic was discarded in the proper designated containers. Technical explanation of process:  L2 Medial Branch Nerve Block (MBB): The target area for the L2 medial branch is at the junction of the postero-lateral aspect of the superior articular process and the superior, posterior, and medial edge of the transverse process of L3. Under fluoroscopic guidance, a Quincke needle was inserted until contact was made with os over the superior postero-lateral aspect of the pedicular shadow (target area). After negative aspiration for blood, 0.5 mL of the nerve block solution was injected without difficulty or complication. The needle was removed intact. L3 Medial Branch Nerve Block (MBB): The target area for the L3 medial branch is at the junction of the postero-lateral aspect of the superior articular process and the superior, posterior, and medial edge of the transverse process of L4. Under fluoroscopic guidance, a Quincke needle was inserted until contact was made with os over the superior postero-lateral aspect of the pedicular shadow (target area). After negative aspiration for blood, 0.5 mL of the nerve block solution was injected without difficulty or complication. The needle was removed intact. L4 Medial Branch Nerve Block (MBB): The target area for the L4 medial branch is at the junction of the postero-lateral aspect of the superior articular process and the superior, posterior, and medial edge of the transverse process of L5. Under fluoroscopic guidance, a Quincke needle was inserted until contact was made with os over the superior postero-lateral aspect of the pedicular shadow (target area). After negative aspiration for blood, 0.5 mL of the nerve block solution was injected without difficulty or complication. The needle was removed intact. L5 Medial Branch Nerve Block (MBB): The target area for the L5 medial branch is at the junction of the postero-lateral aspect of  the superior articular process and the superior, posterior, and medial edge of the sacral ala. Under fluoroscopic guidance, a Quincke needle was inserted until contact was made with os over the superior postero-lateral aspect of the  pedicular shadow (target area). After negative aspiration for blood, 0.5 mL of the nerve block solution was injected without difficulty or complication. The needle was removed intact. S1 Medial Branch Nerve Block (MBB): The target area for the S1 medial branch is at the posterior and inferior 6 o'clock position of the L5-S1 facet joint. Under fluoroscopic guidance, the Quincke needle inserted for the L5 MBB was redirected until contact was made with os over the inferior and postero aspect of the sacrum, at the 6 o' clock position under the L5-S1 facet joint (Target area). After negative aspiration for blood, 0.5 mL of the nerve block solution was injected without difficulty or complication. The needle was removed intact.  Nerve block solution: 0.2% PF-Ropivacaine + Triamcinolone (40 mg/mL) diluted to a final concentration of 4 mg of Triamcinolone/mL of Ropivacaine The unused portion of the solution was discarded in the proper designated containers. Procedural Needles: 22-gauge, 5-inch, Quincke needles used for all levels.  Once the entire procedure was completed, the treated area was cleaned, making sure to leave some of the prepping solution back to take advantage of its long term bactericidal properties.   Illustration of the posterior view of the lumbar spine and the posterior neural structures. Laminae of L2 through S1 are labeled. DPRL5, dorsal primary ramus of L5; DPRS1, dorsal primary ramus of S1; DPR3, dorsal primary ramus of L3; FJ, facet (zygapophyseal) joint L3-L4; I, inferior articular process of L4; LB1, lateral branch of dorsal primary ramus of L1; IAB, inferior articular branches from L3 medial branch (supplies L4-L5 facet joint); IBP, intermediate branch plexus;  MB3, medial branch of dorsal primary ramus of L3; NR3, third lumbar nerve root; S, superior articular process of L5; SAB, superior articular branches from L4 (supplies L4-5 facet joint also); TP3, transverse process of L3.  Vitals:   07/14/20 0935 07/14/20 0943 07/14/20 0953 07/14/20 1005  BP: (!) 153/94 (!) 154/89 (!) 159/85 (!) 152/81  Pulse:      Resp: 13 15 16 15   Temp:  (!) 97.4 F (36.3 C)  (!) 97.3 F (36.3 C)  SpO2: 97% 97% 97% 98%  Weight:      Height:         Start Time: 0926 hrs. End Time: 0935 hrs.  Imaging Guidance (Spinal):          Type of Imaging Technique: Fluoroscopy Guidance (Spinal) Indication(s): Assistance in needle guidance and placement for procedures requiring needle placement in or near specific anatomical locations not easily accessible without such assistance. Exposure Time: Please see nurses notes. Contrast: None used. Fluoroscopic Guidance: I was personally present during the use of fluoroscopy. "Tunnel Vision Technique" used to obtain the best possible view of the target area. Parallax error corrected before commencing the procedure. "Direction-depth-direction" technique used to introduce the needle under continuous pulsed fluoroscopy. Once target was reached, antero-posterior, oblique, and lateral fluoroscopic projection used confirm needle placement in all planes. Images permanently stored in EMR. Interpretation: No contrast injected. I personally interpreted the imaging intraoperatively. Adequate needle placement confirmed in multiple planes. Permanent images saved into the patient's record.  Antibiotic Prophylaxis:   Anti-infectives (From admission, onward)   None     Indication(s): None identified  Post-operative Assessment:  Post-procedure Vital Signs:  Pulse/HCG Rate: 7376 Temp: (!) 97.3 F (36.3 C) Resp: 15 BP: (!) 152/81 SpO2: 98 %  EBL: None  Complications: No immediate post-treatment complications observed by team, or reported by  patient.  Note: The patient tolerated the entire procedure well. A  repeat set of vitals were taken after the procedure and the patient was kept under observation following institutional policy, for this type of procedure. Post-procedural neurological assessment was performed, showing return to baseline, prior to discharge. The patient was provided with post-procedure discharge instructions, including a section on how to identify potential problems. Should any problems arise concerning this procedure, the patient was given instructions to immediately contact us, at any time, without hesitation. In any case, we plan to contact the patient by telephone for a follow-up status report regarding this interventional procedure.  Comments:  No additional relevant information.  Plan of Care  Orders:  Orders Placed This Encounter  Procedures  . LUMBAR FACET(MEDIAL BRANCH NERVE BLOCK) MBNB    Scheduling Instructions:     Procedure: Lumbar facet block (AKA.: Lumbosacral medial branch nerve block)     Side: Left-sided     Level: L3-4, L4-5, & L5-S1 Facets (L2, L3, L4, L5, & S1 Medial Branch Nerves)     Sedation: Patient's choice.     Timeframe: Today    Order Specific Question:   Where will this procedure be performed?    Answer:   [facility]  . [facility] C-ARM 1-60 MIN NO REPORT    Intraoperative interpretation by procedural physician at [facility].    Standing Status:   Standing    Number of Occurrences:   1    Order Specific Question:   Reason for exam:    Answer:   Assistance in needle guidance and placement for procedures requiring needle placement in or near specific anatomical locations not easily accessible without such assistance.  . Informed Consent Details: Physician/Practitioner Attestation; Transcribe to consent form and obtain patient signature    Nursing Order: Transcribe to consent form and obtain patient signature. Note: Always confirm laterality of pain with  Ms. Manger, before procedure.    Order Specific Question:   Physician/Practitioner attestation of informed consent for procedure/surgical case    Answer:   I, the physician/practitioner, attest that I have discussed with the patient the benefits, risks, side effects, alternatives, likelihood of achieving goals and potential problems during recovery for the procedure that I have provided informed consent.    Order Specific Question:   Procedure    Answer:   Lumbar Facet Block  under fluoroscopic guidance    Order Specific Question:   Physician/Practitioner performing the procedure    Answer:   Gray Doering A. Dossie Arbour MD    Order Specific Question:   Indication/Reason    Answer:   Low Back Pain, with our without leg pain, due to Facet Joint Arthralgia (Joint Pain) Spondylosis (Arthritis of the Spine), without myelopathy or radiculopathy (Nerve Damage).  . Provide equipment / supplies at bedside    "Block Tray" (Disposable  single use) Needle type: SpinalSpinal Amount/quantity: 4 Size: Medium (5-inch) Gauge: 22G    Standing Status:   Standing    Number of Occurrences:   1    Order Specific Question:   Specify    Answer:   Block Tray   Chronic Opioid Analgesic:  No other oral opioid analgesics prescribed by our practice. Intrathecal PF-Hydromorphone 3.9295 mg/day + PF-Sufentanil 34.383 mcg/day. MME: [employer]. 58.7 mg/day.   Medications ordered for procedure: Meds ordered this encounter  Medications  . lidocaine (XYLOCAINE) 2 % (with pres) injection 400 mg  . lactated ringers infusion 1,000 mL  . midazolam (VERSED) 5 MG/5ML injection 1-2 mg    Make sure Flumazenil is available in the  pyxis when using this medication. If oversedation occurs, administer 0.2 mg IV over 15 sec. If after 45 sec no response, administer 0.2 mg again over 1 min; may repeat at 1 min intervals; not to exceed 4 doses (1 mg)  . fentaNYL (SUBLIMAZE) injection 25-50 mcg    Make sure Narcan is available in the pyxis when  using this medication. In the event of respiratory depression (RR< 8/min): Titrate NARCAN (naloxone) in increments of 0.1 to 0.2 mg IV at 2-3 minute intervals, until desired degree of reversal.  . ropivacaine (PF) 2 mg/mL (0.2%) (NAROPIN) injection 9 mL  . triamcinolone acetonide (KENALOG-40) injection 40 mg   Medications administered: We administered lidocaine, lactated ringers, midazolam, fentaNYL, ropivacaine (PF) 2 mg/mL (0.2%), and triamcinolone acetonide.  See the medical record for exact dosing, route, and time of administration.  Follow-up plan:   Return in about 2 weeks (around 07/28/2020) for (VIrtual), (PP) Follow-up.      Interventional Therapies  Risk  Complexity Considerations:   Presence of intrathecal pump  DM  CAD  Pulmonary hypertension    Planned  Pending:   Diagnostic left lumbar facet block #2 (today)   Under consideration:   Diagnostic left sided lumbar facet block #2 Possible left-sided lumbar facet RFA   Completed:   Palliative intrathecal pump refills  Diagnostic left lumbar facet block x1 (12/22/2016) (100/100/100/100)   Therapeutic  Palliative (PRN) options:   Palliative Intrathecal pump refillas per programming  Diagnostic left sided lumbar facet block #2      Recent Visits Date Type Provider Dept  07/08/20 Procedure visit Milinda Pointer, MD [facility]  04/28/20 Procedure visit Milinda Pointer, MD [facility]  Showing recent visits within past 90 days and meeting all other requirements Today's Visits Date Type Provider Dept  07/14/20 Procedure visit Milinda Pointer, MD [facility]  Showing today's visits and meeting all other requirements Future Appointments Date Type Provider Dept  07/28/20 Appointment Milinda Pointer, MD [facility]  09/10/20 Appointment Milinda Pointer, MD [facility]  Showing future appointments within next 90 days and meeting all other  requirements  Disposition: Discharge home  Discharge (Date  Time): 07/14/2020; 1006 hrs.   Primary Care Physician: Iva Lento, PA-C Location: [facility] Note by: Gaspar Cola, MD Date: 07/14/2020; Time: 10:17 AM  Disclaimer:  Medicine is not an exact science. The only guarantee in medicine is that nothing is guaranteed. It is important to note that the decision to proceed with this intervention was based on the information collected from the patient. The Data and conclusions were drawn from the patient's questionnaire, the interview, and the physical examination. Because the information was provided in large part by the patient, it cannot be guaranteed that it has not been purposely or unconsciously manipulated. Every effort has been made to obtain as much relevant data as possible for this evaluation. It is important to note that the conclusions that lead to this procedure are derived in large part from the available data. Always take into account that the treatment will also be dependent on availability of resources and existing treatment guidelines, considered by other Pain Management Practitioners as being common knowledge and practice, at the time of the intervention. For Medico-Legal purposes, it is also important to point out that variation in procedural techniques and pharmacological choices are the acceptable norm. The indications, contraindications, technique, and results of the above procedure should only be interpreted and judged by a Board-Certified Interventional  Pain Specialist with extensive familiarity and expertise in the same exact procedure and technique.

## 2020-07-15 ENCOUNTER — Telehealth: Payer: Self-pay

## 2020-07-15 NOTE — Telephone Encounter (Signed)
Post procedure follow  Up call.  LM

## 2020-07-25 NOTE — Progress Notes (Signed)
Patient: Tiffany Hunter  Service Category: E/M  Provider: Gaspar Cola, MD  DOB: 1945/09/06  DOS: 07/28/2020  Location: Office  MRN: [id number]  Setting: Ambulatory outpatient  Referring Provider: Iva Lento, PA-C  Type: Established Patient  Specialty: Interventional Pain Management  PCP: Tiffany Lento, PA-C  Location: Remote location  Delivery: TeleHealth     Virtual Encounter - Pain Management PROVIDER NOTE: Information contained herein reflects review and annotations entered in association with encounter. Interpretation of such information and data should be left to medically-trained personnel. Information provided to patient can be located elsewhere in the medical record under "Patient Instructions". Document created using STT-dictation technology, any transcriptional errors that may result from process are unintentional.    Contact & Pharmacy Preferred: [phone] Home: [phone] (home) Mobile: There is no such number on file (mobile). E-mail: No e-mail address on record  [place], [place] - [place] [address] [place] [address] Phone: [phone] Fax: [phone]   Pre-screening  Tiffany Hunter offered "in-person" vs "virtual" encounter. She indicated preferring virtual for this encounter.   Reason COVID-19*  Social distancing based on CDC and AMA recommendations.   I contacted Tiffany Hunter on 07/28/2020 via telephone.      I clearly identified myself as Gaspar Cola, MD. I verified that I was speaking with the correct person using two identifiers (Name: Tiffany Hunter, and date of birth: 1946-02-15).  Consent I sought verbal advanced consent from Tiffany Hunter for virtual visit interactions. I informed Tiffany Hunter of possible security and privacy concerns, risks, and limitations associated with providing "not-in-person" medical evaluation and management services. I also informed Tiffany Hunter of the  availability of "in-person" appointments. Finally, I informed her that there would be a charge for the virtual visit and that she could be  personally, fully or partially, financially responsible for it. [name] expressed understanding and agreed to proceed.   Historic Elements   Tiffany Hunter is a 75 y.o. year old, female patient evaluated today after our last contact on 07/14/2020. 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 She also  has a past surgical history that includes Carpal tunnel release (Bilateral); Back surgery; Breast surgery (Bilateral); Cholecystectomy; Colonoscopy w/ polypectomy; Cataract extraction w/ intraocular lens implant (Right); Abdominal hysterectomy; Fracture surgery (Right); Cardiac catheterization (2012); and Pain pump revision (Left, 07/07/2017). Tiffany Hunter has a current medication list which includes the following prescription(s): aspirin ec, atorvastatin, carvedilol, meloxicam, naloxone, tramadol, albuterol, naloxegol oxalate, and benefiber. She  reports that she has never smoked. She has never used smokeless tobacco. She reports that she does not drink alcohol and does not use drugs. Tiffany Hunter is allergic to doxycycline hyclate and sulfa antibiotics.   HPI  Today, she is being contacted for a post-procedure assessment.  The patient indicates having attained 100%  relief of the pain from her second diagnostic left lumbar facet block.  Her first injection was on 12/22/2016 and it provided her with adequate relief of the pain until recently when we had to repeat the procedure on 07/14/2020.  This means that her first diagnostic left lumbar facet block provided her with approximately 2 years, 5 months, and 22 days of pain relief.  Obviously, if her second block does this well as the first, then there is no need to move to a radiofrequency ablation however, if the duration of benefit was less than 3 months, then we could argue that she would benefit from a radiofrequency ablation for the purpose of extending on that benefit.  For the time being, we will continue with her palliative facet blocks and the intrathecal pump.  Post-Procedure Evaluation  Procedure (07/14/2020): Diagnostic left lumbar facet block #2 under fluoroscopic guidance and IV sedation Pre-procedure pain level: 9/10 Post-procedure: 0/10 (100% relief)  Sedation: Sedation provided.  Effectiveness during initial hour after procedure(Ultra-Short Term Relief): 100 %.  Local anesthetic used: Long-acting (4-6 hours) Effectiveness: Defined as any analgesic benefit obtained secondary to the administration of local anesthetics. This carries significant diagnostic value as to the etiological location, or anatomical origin, of the pain. Duration of benefit is expected to coincide with the duration of the local anesthetic used.  Effectiveness during initial 4-6 hours after procedure(Short-Term Relief): 100 %.  Long-term benefit: Defined as any relief past the pharmacologic duration of the local anesthetics.  Effectiveness past the initial 6 hours after procedure(Long-Term Relief): 100 %.  Current benefits: Defined as benefit that persist at this time.   Analgesia:  The patient indicates currently enjoying an ongoing 100% relief of her left lower back pain. Function: Tiffany Hunter reports improvement in  function ROM: Tiffany Hunter reports improvement in ROM  Pharmacotherapy Assessment  Analgesic: No other oral opioid analgesics prescribed by our practice. Intrathecal PF-Hydromorphone 3.9295 mg/day + PF-Sufentanil 34.383 mcg/day. MME: [employer]. 58.7 mg/day.   Monitoring: [place] PMP: PDMP reviewed during this encounter.       Pharmacotherapy: No side-effects or adverse reactions reported. Compliance: No problems identified. Effectiveness: Clinically acceptable. Plan: Refer to "POC".  UDS:  Summary  Date Value Ref Range Status  07/30/2019 Note  Final    Comment:    ==================================================================== Compliance Drug Analysis, Ur ==================================================================== Test                             Result       Flag       Units Drug Present not Declared for Prescription Verification   Hydromorphone                  1757         UNEXPECTED ng/mg creat    Hydromorphone may be administered as a scheduled prescription    medication; it is also an expected metabolite of hydrocodone.   Naproxen                       PRESENT      UNEXPECTED Drug Absent but Declared for Prescription Verification   Salicylate                     Not Detected UNEXPECTED    Aspirin, as indicated in the declared medication list, is not always    detected even when used as directed. ==================================================================== Test                      Result    Flag   Units      Ref Range   Creatinine  182              mg/dL      >=20 ==================================================================== Declared Medications:  The flagging and interpretation on this report are based on the  following declared medications.  Unexpected results may arise from  inaccuracies in the declared medications.  **Note: The testing scope of this panel does not include small to  moderate amounts of these reported medications:   Aspirin  **Note: The testing scope of this panel does not include the  following reported medications:  Albuterol (Ventolin HFA)  Atorvastatin (Lipitor)  Betamethasone (Lotrisone)  Carvedilol (Coreg)  Cholecalciferol  Clotrimazole (Lotrisone)  Hydrocortisone  Mirabegron (Myrbetriq)  Naloxegol (Movantik)  Oxybutynin (Ditropan)  Supplement ==================================================================== For clinical consultation, please call [phone]. ====================================================================     Laboratory Chemistry Profile   Renal Lab Results  Component Value Date   BUN 8 07/30/2019   CREATININE 0.56 (L) 07/30/2019   BCR 14 07/30/2019   GFR 113.32 12/16/2010   GFRAA 107 07/30/2019   GFRNONAA 93 07/30/2019     Hepatic Lab Results  Component Value Date   AST 14 07/30/2019   ALT 23 10/29/2015   ALBUMIN 3.9 07/30/2019   ALKPHOS 91 07/30/2019     Electrolytes Lab Results  Component Value Date   NA 143 07/30/2019   K 3.8 07/30/2019   CL 100 07/30/2019   CALCIUM 8.7 07/30/2019   MG 1.7 07/30/2019     Bone Lab Results  Component Value Date   25OHVITD1 42 07/30/2019   25OHVITD2 1.1 07/30/2019   25OHVITD3 41 07/30/2019     Inflammation (CRP: Acute Phase) (ESR: Chronic Phase) Lab Results  Component Value Date   CRP 12 (H) 07/30/2019   ESRSEDRATE 18 07/30/2019       Note: Above Lab results reviewed.  Imaging  DG PAIN CLINIC C-ARM 1-60 MIN NO REPORT Fluoro was used, but no Radiologist interpretation will be provided.  Please refer to "NOTES" tab for provider progress note.  Assessment  The primary encounter diagnosis was Chronic pain syndrome. 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  Plan of Care  Problem-specific:  No problem-specific Assessment & Plan notes found for this encounter.  Tiffany Hunter has a current medication list which includes the following long-term medication(s): carvedilol, naloxone, albuterol, naloxegol oxalate, and benefiber.  Pharmacotherapy (Medications Ordered): No orders of the defined types were placed in this encounter.  Orders:  No orders of the defined types were placed in this encounter.  Follow-up plan:   Return for scheduled encounter, Pump Refill (Max:74mo.      Interventional Therapies  Risk  Complexity Considerations:   Presence of intrathecal pump  DM  CAD  Pulmonary hypertension    Planned  Pending:      Under consideration:   Diagnostic left lumbar facet MBB #3 Possible left lumbar facet RFA #1   Completed:   Palliative intrathecal pump refills  Diagnostic left lumbar facet block x2 (12/22/2016) (07/14/2020) (100/100/100/100)   Therapeutic  Palliative (PRN) options:   Palliative Intrathecal pump refillas per programming  Diagnostic left sided lumbar facet block #3      Recent Visits Date Type Provider Dept  07/14/20 Procedure visit NMilinda Pointer MD [facility]  [facility]  Showing recent visits within past 90 days and meeting all other requirements Today's Visits Date Type Provider Dept  07/28/20 Telemedicine Milinda Pointer, MD [facility]  Showing today's visits and meeting all other requirements Future Appointments Date Type Provider Dept  09/10/20 Appointment Milinda Pointer, MD [facility]  Showing future appointments within next 90 days and meeting all other requirements  I discussed the assessment and treatment plan with the patient. The patient was provided an opportunity to ask questions and all were answered. The patient agreed with the plan and demonstrated an understanding  of the instructions.  Patient advised to call back or seek an in-person evaluation if the symptoms or condition worsens.  Duration of encounter: 12 minutes.  Note by: Gaspar Cola, MD Date: 07/28/2020; Time: 5:08 PM

## 2020-07-27 ENCOUNTER — Telehealth: Payer: Self-pay

## 2020-07-27 NOTE — Telephone Encounter (Signed)
LM for patient to call office for pre virtual appointment questions.  

## 2020-07-28 ENCOUNTER — Other Ambulatory Visit: Payer: Self-pay

## 2020-07-28 ENCOUNTER — Encounter: Payer: Self-pay | Admitting: Pain Medicine

## 2020-07-28 ENCOUNTER — Ambulatory Visit: Payer: Medicare Other | Attending: Pain Medicine | Admitting: Pain Medicine

## 2020-07-28 DIAGNOSIS — G8929 Other chronic pain: Secondary | ICD-10-CM

## 2020-07-28 DIAGNOSIS — M545 Low back pain, unspecified: Secondary | ICD-10-CM | POA: Diagnosis not present

## 2020-07-28 DIAGNOSIS — Z978 Presence of other specified devices: Secondary | ICD-10-CM

## 2020-07-28 DIAGNOSIS — G894 Chronic pain syndrome: Secondary | ICD-10-CM

## 2020-07-28 DIAGNOSIS — M47816 Spondylosis without myelopathy or radiculopathy, lumbar region: Secondary | ICD-10-CM | POA: Diagnosis not present

## 2020-07-28 DIAGNOSIS — M961 Postlaminectomy syndrome, not elsewhere classified: Secondary | ICD-10-CM

## 2020-07-28 DIAGNOSIS — M5137 Other intervertebral disc degeneration, lumbosacral region: Secondary | ICD-10-CM | POA: Diagnosis not present

## 2020-07-29 ENCOUNTER — Encounter: Payer: Medicare Other | Admitting: Pain Medicine

## 2020-07-30 MED FILL — Medication: INTRATHECAL | Qty: 1 | Status: AC

## 2020-08-11 ENCOUNTER — Other Ambulatory Visit: Payer: Self-pay

## 2020-08-11 MED ORDER — PAIN MANAGEMENT IT PUMP REFILL: 1.0000 | Freq: Once | INTRATHECAL | 0 refills | Status: AC

## 2020-09-10 ENCOUNTER — Ambulatory Visit: Payer: Medicare Other | Attending: Pain Medicine | Admitting: Pain Medicine

## 2020-09-10 ENCOUNTER — Encounter: Payer: Self-pay | Admitting: Pain Medicine

## 2020-09-10 ENCOUNTER — Other Ambulatory Visit: Payer: Self-pay

## 2020-09-10 VITALS — BP 138/83 | HR 71 | Temp 97.0°F | Resp 15 | Ht 62.0 in | Wt 170.0 lb

## 2020-09-10 DIAGNOSIS — M961 Postlaminectomy syndrome, not elsewhere classified: Secondary | ICD-10-CM | POA: Insufficient documentation

## 2020-09-10 DIAGNOSIS — G8929 Other chronic pain: Secondary | ICD-10-CM | POA: Diagnosis present

## 2020-09-10 DIAGNOSIS — Z79891 Long term (current) use of opiate analgesic: Secondary | ICD-10-CM | POA: Diagnosis present

## 2020-09-10 DIAGNOSIS — M5137 Other intervertebral disc degeneration, lumbosacral region: Secondary | ICD-10-CM | POA: Insufficient documentation

## 2020-09-10 DIAGNOSIS — Z79899 Other long term (current) drug therapy: Secondary | ICD-10-CM | POA: Diagnosis present

## 2020-09-10 DIAGNOSIS — M47816 Spondylosis without myelopathy or radiculopathy, lumbar region: Secondary | ICD-10-CM | POA: Diagnosis present

## 2020-09-10 DIAGNOSIS — Z451 Encounter for adjustment and management of infusion pump: Secondary | ICD-10-CM | POA: Insufficient documentation

## 2020-09-10 DIAGNOSIS — Z978 Presence of other specified devices: Secondary | ICD-10-CM | POA: Insufficient documentation

## 2020-09-10 DIAGNOSIS — M545 Low back pain, unspecified: Secondary | ICD-10-CM | POA: Diagnosis present

## 2020-09-10 DIAGNOSIS — G894 Chronic pain syndrome: Secondary | ICD-10-CM | POA: Insufficient documentation

## 2020-09-10 DIAGNOSIS — M79605 Pain in left leg: Secondary | ICD-10-CM | POA: Insufficient documentation

## 2020-09-10 NOTE — Patient Instructions (Signed)
____________________________________________________________________________________________  Post-Procedure Discharge Instructions  Instructions:  Apply ice:   Purpose: This will minimize any swelling and discomfort after procedure.   When: Day of procedure, as soon as you get home.  How: Fill a plastic sandwich bag with crushed ice. Cover it with a small towel and apply to injection site.  How long: (15 min on, 15 min off) Apply for 15 minutes then remove x 15 minutes.  Repeat sequence on day of procedure, until you go to bed.  Apply heat:   Purpose: To treat any soreness and discomfort from the procedure.  When: Starting the next day after the procedure.  How: Apply heat to procedure site starting the day following the procedure.  How long: May continue to repeat daily, until discomfort goes away.  Food intake: Start with clear liquids (like water) and advance to regular food, as tolerated.   Physical activities: Keep activities to a minimum for the first 8 hours after the procedure. After that, then as tolerated.  Driving: If you have received any sedation, be responsible and do not drive. You are not allowed to drive for 24 hours after having sedation.  Blood thinner: (Applies only to those taking blood thinners) You may restart your blood thinner 6 hours after your procedure.  Insulin: (Applies only to Diabetic patients taking insulin) As soon as you can eat, you may resume your normal dosing schedule.  Infection prevention: Keep procedure site clean and dry. Shower daily and clean area with soap and water.  Post-procedure Pain Diary: Extremely important that this be done correctly and accurately. Recorded information will be used to determine the next step in treatment. For the purpose of accuracy, follow these rules:  Evaluate only the area treated. Do not report or include pain from an untreated area. For the purpose of this evaluation, ignore all other areas of pain,  except for the treated area.  After your procedure, avoid taking a long nap and attempting to complete the pain diary after you wake up. Instead, set your alarm clock to go off every hour, on the hour, for the initial 8 hours after the procedure. Document the duration of the numbing medicine, and the relief you are getting from it.  Do not go to sleep and attempt to complete it later. It will not be accurate. If you received sedation, it is likely that you were given a medication that may cause amnesia. Because of this, completing the diary at a later time may cause the information to be inaccurate. This information is needed to plan your care.  Follow-up appointment: Keep your post-procedure follow-up evaluation appointment after the procedure (usually 2 weeks for most procedures, 6 weeks for radiofrequencies). DO NOT FORGET to bring you pain diary with you.   Expect: (What should I expect to see with my procedure?)  From numbing medicine (AKA: Local Anesthetics): Numbness or decrease in pain. You may also experience some weakness, which if present, could last for the duration of the local anesthetic.  Onset: Full effect within 15 minutes of injected.  Duration: It will depend on the type of local anesthetic used. On the average, 1 to 8 hours.   From steroids (Applies only if steroids were used): Decrease in swelling or inflammation. Once inflammation is improved, relief of the pain will follow.  Onset of benefits: Depends on the amount of swelling present. The more swelling, the longer it will take for the benefits to be seen. In some cases, up to 10 days.    Duration: Steroids will stay in the system x 2 weeks. Duration of benefits will depend on multiple posibilities including persistent irritating factors.  Side-effects: If present, they may typically last 2 weeks (the duration of the steroids).  Frequent: Cramps (if they occur, drink Gatorade and take over-the-counter Magnesium 450-500 mg  once to twice a day); water retention with temporary weight gain; increases in blood sugar; decreased immune system response; increased appetite.  Occasional: Facial flushing (red, warm cheeks); mood swings; menstrual changes.  Uncommon: Long-term decrease or suppression of natural hormones; bone thinning. (These are more common with higher doses or more frequent use. This is why we prefer that our patients avoid having any injection therapies in other practices.)   Very Rare: Severe mood changes; psychosis; aseptic necrosis.  From procedure: Some discomfort is to be expected once the numbing medicine wears off. This should be minimal if ice and heat are applied as instructed.  Call if: (When should I call?)  You experience numbness and weakness that gets worse with time, as opposed to wearing off.  New onset bowel or bladder incontinence. (Applies only to procedures done in the spine)  Emergency Numbers:  Durning business hours (Monday - Thursday, 8:00 AM - 4:00 PM) (Friday, 9:00 AM - 12:00 Noon): [phone]  After hours: [phone]  NOTE: If you are having a problem and are unable connect with, or to talk to a provider, then go to your nearest urgent care or emergency department. If the problem is serious and urgent, please call 911. ____________________________________________________________________________________________    Opioid Overdose Opioids are drugs that are often used to treat pain. Opioids include illegal drugs, such as heroin, as well as prescription pain medicines, such as codeine, morphine, hydrocodone, oxycodone, and fentanyl. An opioid overdose happens when you take too much of an opioid. An overdose may be intentional or accidental and can happen with any type of opioid. The effects of an overdose can be mild, dangerous, or even deadly. Opioid overdose is a medical emergency. What are the causes? This condition may be caused by:  Taking too much of an  opioid on purpose.  Taking too much of an opioid by accident.  Using two or more substances that contain opioids at the same time.  Taking an opioid with a substance that affects your heart, breathing, or blood pressure. These include alcohol, tranquilizers, sleeping pills, illegal drugs, and some over-the-counter medicines. This condition may also happen due to an error made by:  A health care provider who prescribes a medicine.  The pharmacist who fills the prescription order. What increases the risk? This condition is more likely in:  Children. They may be attracted to colorful pills. Because of a child's small size, even a small amount of a drug can be dangerous.  Older people. They may be taking many different drugs. Older people may have difficulty reading labels or remembering when they last took their medicine. They may also be more sensitive to the effects of opioids.  People with chronic medical conditions, especially heart, liver, kidney, or neurological diseases.  People who take an opioid for a long period of time.  People who use: ? Illegal drugs. IV heroin is especially dangerous. ? Other substances, including alcohol, while using an opioid.  People who have: ? A history of drug or alcohol abuse. ? Certain mental health conditions. ? A history of previous drug overdoses.  People who take opioids that are not prescribed for them. What are the signs or  symptoms? Symptoms of this condition depend on the type of opioid and the amount that was taken. Common symptoms include:  Sleepiness or difficulty waking from sleep.  Decrease in attention.  Confusion.  Slurred speech.  Slowed breathing and a slow pulse (bradycardia).  Nausea and vomiting.  Abnormally small pupils. Signs and symptoms that require emergency treatment include:  Cold, clammy, and pale skin.  Blue lips and fingernails.  Vomiting.  Gurgling sounds in the throat.  A pulse that is very  slow or difficult to detect.  Breathing that is very irregular, slow, noisy, or difficult to detect.  Limp body.  Inability to respond to speech or be awakened from sleep (stupor).  Seizures. How is this diagnosed? This condition is diagnosed based on your symptoms and medical history. It is important to tell your health care provider:  About all of the opioids that you took.  When you took the opioids.  Whether you were drinking alcohol or using marijuana, cocaine, or other drugs. Your health care provider will do a physical exam. This exam may include:  Checking and monitoring your heart rate and rhythm, breathing rate, temperature, and blood pressure (vital signs).  Measuring oxygen levels in your blood.  Checking for abnormally small pupils. You may also have blood tests or urine tests. You may have X-rays if you are having severe breathing problems. How is this treated? This condition requires immediate medical treatment and hospitalization. Treatment is given in the hospital intensive care (ICU) setting. Supporting your vital signs and your breathing is the first step in treating an opioid overdose. Treatment may also include:  Giving salts and minerals (electrolytes) along with fluids through an IV.  Inserting a breathing tube (endotracheal tube) in your airway to help you breathe if you cannot breathe on your own or you are in danger of not being able to breathe on your own.  Giving oxygen through a small tube under your nose.  Passing a tube through your nose and into your stomach (nasogastric tube, or NG tube) to empty your stomach.  Giving medicines that: ? Increase your blood pressure. ? Relieve nausea and vomiting. ? Relieve abdominal pain and cramping. ? Reverse the effects of the opioid (naloxone).  Monitoring your heart and oxygen levels.  Ongoing counseling and mental health support if you intentionally overdosed or used an illegal drug. Follow these  instructions at home: Medicines  Take over-the-counter and prescription medicines only as told by your health care provider.  Always ask your health care provider about possible side effects and interactions of any new medicine that you start taking.  Keep a list of all the medicines that you take, including over-the-counter medicines. Bring this list with you to all your medical visits. General instructions  Drink enough fluid to keep your urine pale yellow.  Keep all follow-up visits as told by your health care provider. This is important.   How is this prevented?  Read the drug inserts that come with your opioid pain medicines.  Take medicines only as told by your health care provider. Do not take more medicine than you are told. Do not take medicines more frequently than you are told.  Do not drink alcohol or take sedatives when taking opioids.  Do not use illegal or recreational drugs, including cocaine, ecstasy, and marijuana.  Do not take opioid medicines that are not prescribed for you.  Store all medicines in safety containers that are out of the reach of children.  Get help  if you are struggling with: ? Alcohol or drug use. ? Depression or another mental health problem. ? Thoughts of hurting yourself or another person.  Keep the phone number of your local poison control center near your phone or in your mobile phone. In the [place] the hotline of the [facility] is 862-108-2390.  If you were prescribed naloxone, make sure you understand how to take it. Contact a health care provider if you:  Need help understanding how to take your pain medicines.  Feel your medicines are too strong.  Are concerned that your pain medicines are not working well for your pain.  Develop new symptoms or side effects when you are taking medicines. Get help right away if:  You or someone else is having symptoms of an opioid overdose. Get help even if you are not  sure.  You have serious thoughts about hurting yourself or others.  You have: ? Chest pain. ? Difficulty breathing. ? A loss of consciousness. These symptoms may represent a serious problem that is an emergency. Do not wait to see if the symptoms will go away. Get medical help right away. Call your local emergency services (911 in the [place] Do not drive yourself to the hospital. If you ever feel like you may hurt yourself or others, or have thoughts about taking your own life, get help right away. You can go to your nearest emergency department or call:  Your local emergency services (911 in the [place]  A suicide crisis helpline, such as the [place] at (386) 352-8508. This is open 24 hours a day. Summary  Opioids are drugs that are often used to treat pain. Opioids include illegal drugs, such as heroin, as well as prescription pain medicines.  An opioid overdose happens when you take too much of an opioid.  Overdoses can be intentional or accidental.  Opioid overdose is very dangerous. It is a life-threatening emergency.  If you or someone you know is experiencing an opioid overdose, get help right away. This information is not intended to replace advice given to you by your health care provider. Make sure you discuss any questions you have with your health care provider. Document Revised: 04/19/2018 Document Reviewed: 04/19/2018 [employer] Patient Education  2021 [employer].

## 2020-09-10 NOTE — Progress Notes (Signed)
PROVIDER NOTE: Information contained herein reflects review and annotations entered in association with encounter. Interpretation of such information and data should be left to medically-trained personnel. Information provided to patient can be located elsewhere in the medical record under "Patient Instructions". Document created using STT-dictation technology, any transcriptional errors that may result from process are unintentional.    Patient: Tiffany Hunter  Service Category: Procedure  Provider: Gaspar Cola, MD  DOB: Jan 27, 1946  DOS: 09/10/2020  Location: [facility]  MRN: [id number]  Setting: Ambulatory - outpatient  Referring Provider: Iva Lento, PA-C  Type: Established Patient  Specialty: Interventional Pain Management  PCP: Iva Lento, PA-C   Primary Reason for Visit: Interventional Pain Management Treatment. CC: Back Pain (Lumbar bilateral )  Procedure:          Intrathecal Drug Delivery System (IDDS):  Type: Reservoir Refill [phone]) No rate change Region: Abdominal Laterality: Left  Type of Pump: Medtronic Synchromed II Delivery Route: Intrathecal Type of Pain Treated: Neuropathic/Nociceptive Primary Medication Class: Opioid/opiate  Medication, Concentration, Infusion Program, & Delivery Rate: Please see scanned programming printout.   Indications: 1. 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 Chronic low back pain (Left) w/o sciatica   4. Chronic lower extremity pain (Secondary Area of pain) (Left)   5. Lumbar facet joint syndrome (Left)   6. DDD (degenerative disc disease), lumbosacral   7. Pharmacologic therapy   8. 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 Encounter for adjustment or management of infusion pump   10. Chronic use of opiate for therapeutic purpose    Pain Assessment: Self-Reported Pain Score: 9 /10             Reported level is compatible with  observation.        Pharmacotherapy Assessment  Analgesic: No other oral opioid analgesics prescribed by our practice. Intrathecal PF-Hydromorphone 3.9295 mg/day + PF-Sufentanil 34.383 mcg/day. MME: Aprox. 58.7 mg/day.   Monitoring: [place] PMP: PDMP reviewed during this encounter.       Pharmacotherapy: No side-effects or adverse reactions reported. Compliance: No problems identified. Effectiveness: Clinically acceptable. Plan: Refer to "POC".  UDS:  Summary  Date Value Ref Range Status  07/30/2019 Note  Final    Comment:    ==================================================================== Compliance Drug Analysis, Ur ==================================================================== Test                             Result       Flag       Units Drug Present not Declared for Prescription Verification   Hydromorphone                  1757         UNEXPECTED ng/mg creat    Hydromorphone may be administered as a scheduled prescription    medication; it is also an expected metabolite of hydrocodone.   Naproxen                       PRESENT      UNEXPECTED Drug Absent but Declared for Prescription Verification   Salicylate                     Not Detected UNEXPECTED    Aspirin, as indicated in the declared medication list, is not always    detected even when used as directed. ==================================================================== Test  Result    Flag   Units      Ref Range   Creatinine              182              mg/dL      >=20 ==================================================================== Declared Medications:  The flagging and interpretation on this report are based on the  following declared medications.  Unexpected results may arise from  inaccuracies in the declared medications.  **Note: The testing scope of this panel does not include small to  moderate amounts of these reported medications:  Aspirin  **Note: The testing scope of  this panel does not include the  following reported medications:  Albuterol (Ventolin HFA)  Atorvastatin (Lipitor)  Betamethasone (Lotrisone)  Carvedilol (Coreg)  Cholecalciferol  Clotrimazole (Lotrisone)  Hydrocortisone  Mirabegron (Myrbetriq)  Naloxegol (Movantik)  Oxybutynin (Ditropan)  Supplement ==================================================================== For clinical consultation, please call [phone]. ====================================================================     Intrathecal Pump Therapy Assessment  Manufacturer: Medtronic Synchromed Type: Programmable Volume: 40 mL reservoir MRI compatibility: Yes   Drug content:  Primary Medication Class:Opioid Primary Medication:PF-Hydromorphone (Dilaudid)(70m/mL) Secondary Medication:PF-Sufentanil(70 g/mL) Other Medication:No third    Programming:  Type: Simple continuous. See pump readout for details.   Changes:  Medication Change: None at this point Rate Change: No change in rate  Reported side-effects or adverse reactions: None reported  Effectiveness: Described as relatively effective, allowing for increase in activities of daily living (ADL) Clinically meaningful improvement in function (CMIF): Sustained CMIF goals met  Plan: Pump refill today  Pre-op H&P Assessment:  Tiffany Hunter a 75y.o. (year old), female patient, seen today for interventional treatment. She  has a past surgical history that includes Carpal tunnel release (Bilateral); Back surgery; Breast surgery (Bilateral); Cholecystectomy; Colonoscopy w/ polypectomy; Cataract extraction w/ intraocular lens implant (Right); Abdominal hysterectomy; Fracture surgery (Right); Cardiac catheterization (2012); and Pain pump revision (Left, 07/07/2017). Tiffany Hunter a current medication list which includes the following prescription(s): albuterol, amoxicillin, aspirin ec, atorvastatin, carvedilol, celecoxib, meloxicam, oxybutynin,  tramadol, naloxegol oxalate, naloxone, and benefiber. Her primarily concern today is the Back Pain (Lumbar bilateral )  Initial Vital Signs:  Pulse/HCG Rate: 71  Temp: (!) 97 F (36.1 C) Resp: 15 BP: 138/83 SpO2: 97 %  BMI: Estimated body mass index is 31.09 kg/m as calculated from the following:   Height as of this encounter: '5\' 2"'  (1.575 m).   Weight as of this encounter: 170 lb (77.1 kg).  Risk Assessment: Allergies: Reviewed. She is allergic to doxycycline hyclate and sulfa antibiotics.  Allergy Precautions: None required Coagulopathies: Reviewed. None identified.  Blood-thinner therapy: None at this time Active Infection(s): Reviewed. None identified. Ms. MCampusis afebrile  Site Confirmation: Ms. MGoedenwas asked to confirm the procedure and laterality before marking the site Procedure checklist: Completed Consent: Before the procedure and under the influence of no sedative(s), amnesic(s), or anxiolytics, the patient was informed of the treatment options, risks and possible complications. To fulfill our ethical and legal obligations, as recommended by the American Medical Association's Code of Ethics, I have informed the patient of my clinical impression; the nature and purpose of the treatment or procedure; the risks, benefits, and possible complications of the intervention; the alternatives, including doing nothing; the risk(s) and benefit(s) of the alternative treatment(s) or procedure(s); and the risk(s) and benefit(s) of doing nothing.  Ms. MAyteswas provided with information about the general risks and possible complications associated with most interventional procedures. These include,  but are not limited to: failure to achieve desired goals, infection, bleeding, organ or nerve damage, allergic reactions, paralysis, and/or death.  In addition, she was informed of those risks and possible complications associated to this particular procedure, which include, but are not  limited to: damage to the implant; failure to decrease pain; local, systemic, or serious CNS infections, intraspinal abscess with possible cord compression and paralysis, or life-threatening such as meningitis; bleeding; organ damage; nerve injury or damage with subsequent sensory, motor, and/or autonomic system dysfunction, resulting in transient or permanent pain, numbness, and/or weakness of one or several areas of the body; allergic reactions, either minor or major life-threatening, such as anaphylactic or anaphylactoid reactions.  Furthermore, Ms. Martel was informed of those risks and complications associated with the medications. These include, but are not limited to: allergic reactions (i.e.: anaphylactic or anaphylactoid reactions); endorphine suppression; bradycardia and/or hypotension; water retention and/or peripheral vascular relaxation leading to lower extremity edema and possible stasis ulcers; respiratory depression and/or shortness of breath; decreased metabolic rate leading to weight gain; swelling or edema; medication-induced neural toxicity; particulate matter embolism and blood vessel occlusion with resultant organ, and/or nervous system infarction; and/or intrathecal granuloma formation with possible spinal cord compression and permanent paralysis.  Before refilling the pump Ms. Jacko was informed that some of the medications used in the devise may not be FDA approved for such use and therefore it constitutes an off-label use of the medications.  Finally, she was informed that Medicine is not an exact science; therefore, there is also the possibility of unforeseen or unpredictable risks and/or possible complications that may result in a catastrophic outcome. The patient indicated having understood very clearly. We have given the patient no guarantees and we have made no promises. Enough time was given to the patient to ask questions, all of which were answered to the patient's  satisfaction. Ms. Storbeck has indicated that she wanted to continue with the procedure. Attestation: I, the ordering provider, attest that I have discussed with the patient the benefits, risks, side-effects, alternatives, likelihood of achieving goals, and potential problems during recovery for the procedure that I have provided informed consent. Date  Time: 09/10/2020 12:59 PM  Pre-Procedure Preparation:  Monitoring: As per clinic protocol. Respiration, ETCO2, SpO2, BP, heart rate and rhythm monitor placed and checked for adequate function Safety Precautions: Patient was assessed for positional comfort and pressure points before starting the procedure. Time-out: I initiated and conducted the "Time-out" before starting the procedure, as per protocol. The patient was asked to participate by confirming the accuracy of the "Time Out" information. Verification of the correct person, site, and procedure were performed and confirmed by me, the nursing staff, and the patient. "Time-out" conducted as per Joint Commission's Universal Protocol (UP.01.01.01). Time: 1324  Description of Procedure:          Position: Supine Target Area: Central-port of intrathecal pump. Approach: Anterior, 90 degree angle approach. Area Prepped: Entire Area around the pump implant. DuraPrep (Iodine Povacrylex [0.7% available iodine] and Isopropyl Alcohol, 74% w/w) Safety Precautions: Aspiration looking for blood return was conducted prior to all injections. At no point did we inject any substances, as a needle was being advanced. No attempts were made at seeking any paresthesias. Safe injection practices and needle disposal techniques used. Medications properly checked for expiration dates. SDV (single dose vial) medications used. Description of the Procedure: Protocol guidelines were followed. Two nurses trained to do implant refills were present during the entire procedure. The refill medication was  checked by both healthcare  providers as well as the patient. The patient was included in the "Time-out" to verify the medication. The patient was placed in position. The pump was identified. The area was prepped in the usual manner. The sterile template was positioned over the pump, making sure the side-port location matched that of the pump. Both, the pump and the template were held for stability. The needle provided in the Medtronic Kit was then introduced thru the center of the template and into the central port. The pump content was aspirated and discarded volume documented. The new medication was slowly infused into the pump, thru the filter, making sure to avoid overpressure of the device. The needle was then removed and the area cleansed, making sure to leave some of the prepping solution back to take advantage of its long term bactericidal properties. The pump was interrogated and programmed to reflect the correct medication, volume, and dosage. The program was printed and taken to the physician for approval. Once checked and signed by the physician, a copy was provided to the patient and another scanned into the EMR. Vitals:   09/10/20 1258  BP: 138/83  Pulse: 71  Resp: 15  Temp: (!) 97 F (36.1 C)  TempSrc: Temporal  SpO2: 97%  Weight: 170 lb (77.1 kg)  Height: '5\' 2"'  (1.575 m)    Start Time: 1324 hrs. End Time: 1352 hrs. Materials & Medications: Medtronic Refill Kit Medication(s): Please see chart orders for details.  Imaging Guidance:          Type of Imaging Technique: None used Indication(s): N/A Exposure Time: No patient exposure Contrast: None used. Fluoroscopic Guidance: N/A Ultrasound Guidance: N/A Interpretation: N/A  Antibiotic Prophylaxis:   Anti-infectives (From admission, onward)   None     Indication(s): None identified  Post-operative Assessment:  Post-procedure Vital Signs:  Pulse/HCG Rate: 71  Temp: (!) 97 F (36.1 C) Resp: 15 BP: 138/83 SpO2: 97 %  EBL:  None  Complications: No immediate post-treatment complications observed by team, or reported by patient.  Note: The patient tolerated the entire procedure well. A repeat set of vitals were taken after the procedure and the patient was kept under observation following institutional policy, for this type of procedure. Post-procedural neurological assessment was performed, showing return to baseline, prior to discharge. The patient was provided with post-procedure discharge instructions, including a section on how to identify potential problems. Should any problems arise concerning this procedure, the patient was given instructions to immediately contact us, at any time, without hesitation. In any case, we plan to contact the patient by telephone for a follow-up status report regarding this interventional procedure.  Comments:  No additional relevant information.  Plan of Care  Orders:  Orders Placed This Encounter  Procedures  . PUMP REFILL    Maintain Protocol by having two(2) healthcare providers during procedure and programming.    Scheduling Instructions:     Please refill intrathecal pump today.    Order Specific Question:   Where will this procedure be performed?    Answer:   [facility]  . PUMP REFILL    Whenever possible schedule on a procedure today.    Standing Status:   Future    Standing Expiration Date:   02/07/2021    Scheduling Instructions:     Please schedule intrathecal pump refill based on pump programming. Avoid schedule intervals of more than 120 days (4 months).    Order Specific Question:   Where will this  procedure be performed?    Answer:   [facility]  . Informed Consent Details: Physician/Practitioner Attestation; Transcribe to consent form and obtain patient signature    Transcribe to consent form and obtain patient signature.    Order Specific Question:   Physician/Practitioner attestation of informed consent for procedure/surgical case     Answer:   I, the physician/practitioner, attest that I have discussed with the patient the benefits, risks, side effects, alternatives, likelihood of achieving goals and potential problems during recovery for the procedure that I have provided informed consent.    Order Specific Question:   Procedure    Answer:   Intrathecal pump refill    Order Specific Question:   Physician/Practitioner performing the procedure    Answer:   Attending Physician: Kathlen Brunswick. Dossie Arbour, MD & designated trained staff    Order Specific Question:   Indication/Reason    Answer:   Chronic Pain Syndrome (G89.4), presence of an intrathecal pump (Z97.8)   Chronic Opioid Analgesic:  No other oral opioid analgesics prescribed by our practice. Intrathecal PF-Hydromorphone 3.9295 mg/day + PF-Sufentanil 34.383 mcg/day. MME: Aprox. 58.7 mg/day.   Medications ordered for procedure: No orders of the defined types were placed in this encounter.  Medications administered: Tiffany Hunter had no medications administered during this visit.  See the medical record for exact dosing, route, and time of administration.  Follow-up plan:   Return for Pump Refill (Max:71mo.       Interventional Therapies  Risk  Complexity Considerations:   Estimated body mass index is 31.09 kg/m as calculated from the following:   Height as of this encounter: '5\' 2"'  (1.575 m).   Weight as of this encounter: 170 lb (77.1 kg). Presence of intrathecal pump  DM  CAD  Pulmonary hypertension    Planned  Pending:      Under consideration:   Diagnostic left lumbar facet MBB #3 Possible left lumbar facet RFA #1   Completed:   Palliative intrathecal pump refills  Diagnostic left lumbar facet block x2 (12/22/2016) (07/14/2020) (100/100/100/100)   Therapeutic  Palliative (PRN) options:   Palliative Intrathecal pump refillas per programming  Diagnostic left sided lumbar facet block #3      Recent Visits Date Type Provider Dept  07/28/20  Telemedicine NMilinda Pointer MD [facility]  07/14/20 Procedure visit NMilinda Pointer MD [facility]  07/08/20 Procedure visit NMilinda Pointer MD [facility]  Showing recent visits within past 90 days and meeting all other requirements Today's Visits Date Type Provider Dept  09/10/20 Procedure visit NMilinda Pointer MD [facility]  Showing today's visits and meeting all other requirements Future Appointments Date Type Provider Dept  11/19/20 Appointment NMilinda Pointer MD [facility]  Showing future appointments within next 90 days and meeting all other requirements  Disposition: Discharge home  Discharge (Date  Time): 09/10/2020; 1354 hrs.   Primary Care Physician: WIva Lento PA-C [facility] Note by: FGaspar Cola MD Date: 09/10/2020; Time: 4:53 PM  Disclaimer:  Medicine is not an eChief Strategy Officer The only guarantee in medicine is that nothing is guaranteed. It is important to note that the decision to proceed with this intervention was based on the information collected from the patient. The Data and conclusions were drawn from the patient's questionnaire, the interview, and the physical examination. Because the information was provided in large part by the patient, it cannot be guaranteed that it has not been purposely or unconsciously manipulated.  Every effort has been made to obtain as much relevant data as possible for this evaluation. It is important to note that the conclusions that lead to this procedure are derived in large part from the available data. Always take into account that the treatment will also be dependent on availability of resources and existing treatment guidelines, considered by other Pain Management Practitioners as being common knowledge and practice, at the time of the intervention. For Medico-Legal purposes, it is also important to point out  that variation in procedural techniques and pharmacological choices are the acceptable norm. The indications, contraindications, technique, and results of the above procedure should only be interpreted and judged by a Board-Certified Interventional Pain Specialist with extensive familiarity and expertise in the same exact procedure and technique.

## 2020-09-11 ENCOUNTER — Telehealth: Payer: Self-pay | Admitting: *Deleted

## 2020-09-11 NOTE — Telephone Encounter (Signed)
Spoke with patient re; pump refill on yesterday.  States she is  Sore from the manipulation of the pump during the fill but other than that is doing very well.

## 2020-10-21 ENCOUNTER — Other Ambulatory Visit: Payer: Self-pay

## 2020-10-21 MED ORDER — PAIN MANAGEMENT IT PUMP REFILL: 1.0000 | Freq: Once | INTRATHECAL | 0 refills | Status: AC

## 2020-11-19 ENCOUNTER — Encounter: Payer: Medicare Other | Admitting: Pain Medicine

## 2020-11-23 NOTE — Progress Notes (Signed)
PROVIDER NOTE: Information contained herein reflects review and annotations entered in association with encounter. Interpretation of such information and data should be left to medically-trained personnel. Information provided to patient can be located elsewhere in the medical record under "Patient Instructions". Document created using STT-dictation technology, any transcriptional errors that may result from process are unintentional.    Patient: Tiffany Hunter  Service Category: Procedure  Provider: Gaspar Cola, MD  DOB: 1945/08/24  DOS: 11/24/2020  Location: [facility]  MRN: [id number]  Setting: Ambulatory - outpatient  Referring Provider: Iva Lento, PA-C  Type: Established Patient  Specialty: Interventional Pain Management  PCP: Iva Lento, PA-C   Primary Reason for Visit: Interventional Pain Management Treatment. CC: Back Pain (low)  Procedure:          Intrathecal Drug Delivery System (IDDS):  Type: Reservoir Refill [phone])       Region: Abdominal Laterality: Left  Type of Pump: Medtronic Synchromed II Delivery Route: Intrathecal Type of Pain Treated: Neuropathic/Nociceptive Primary Medication Class: Opioid/opiate  Medication, Concentration, Infusion Program, & Delivery Rate: Please see scanned programming printout.   Indications: 1. 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 Chronic low back pain (Left) w/o sciatica   4. Chronic lower extremity pain (Secondary Area of pain) (Left)   5. Lumbar facet joint syndrome (Left)   6. DDD (degenerative disc disease), lumbosacral   7. Pharmacologic therapy   8. 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 Encounter for adjustment or management of infusion pump   10. Chronic use of opiate for therapeutic purpose   11. Opioid-induced constipation (OIC)    Pain Assessment: Self-Reported Pain Score: 9 /10             Reported level is  compatible with observation.        Pharmacotherapy Assessment  Analgesic: No other oral opioid analgesics prescribed by our practice. Intrathecal PF-Hydromorphone 3.9295 mg/day + PF-Sufentanil 34.383 mcg/day. MME: Aprox. 58.7 mg/day.   Monitoring: [place] PMP: PDMP reviewed during this encounter.       Pharmacotherapy: No side-effects or adverse reactions reported. Compliance: No problems identified. Effectiveness: Clinically acceptable. Plan: Refer to "POC".  UDS:  Summary  Date Value Ref Range Status  07/30/2019 Note  Final    Comment:    ==================================================================== Compliance Drug Analysis, Ur ==================================================================== Test                             Result       Flag       Units Drug Present not Declared for Prescription Verification   Hydromorphone                  1757         UNEXPECTED ng/mg creat    Hydromorphone may be administered as a scheduled prescription    medication; it is also an expected metabolite of hydrocodone.   Naproxen                       PRESENT      UNEXPECTED Drug Absent but Declared for Prescription Verification   Salicylate                     Not Detected UNEXPECTED    Aspirin, as indicated in the declared medication list, is not always    detected even  when used as directed. ==================================================================== Test                      Result    Flag   Units      Ref Range   Creatinine              182              mg/dL      >=20 ==================================================================== Declared Medications:  The flagging and interpretation on this report are based on the  following declared medications.  Unexpected results may arise from  inaccuracies in the declared medications.  **Note: The testing scope of this panel does not include small to  moderate amounts of these reported medications:  Aspirin  **Note: The  testing scope of this panel does not include the  following reported medications:  Albuterol (Ventolin HFA)  Atorvastatin (Lipitor)  Betamethasone (Lotrisone)  Carvedilol (Coreg)  Cholecalciferol  Clotrimazole (Lotrisone)  Hydrocortisone  Mirabegron (Myrbetriq)  Naloxegol (Movantik)  Oxybutynin (Ditropan)  Supplement ==================================================================== For clinical consultation, please call [phone]. ====================================================================     Intrathecal Pump Therapy Assessment  Manufacturer: Medtronic Synchromed Type: Programmable Volume: 40 mL reservoir MRI compatibility: Yes   Drug content:  Primary Medication Class: Opioid Primary Medication: PF-Hydromorphone (Dilaudid) (8 mg/mL)  Secondary Medication: PF-Sufentanil (70 g/mL)  Other Medication: No third    Programming:  Type: Simple continuous. See pump readout for details.   Changes:  Medication Change: None at this point Rate Change: No change in rate  Reported side-effects or adverse reactions: None reported  Effectiveness: Described as relatively effective, allowing for increase in activities of daily living (ADL) Clinically meaningful improvement in function (CMIF): Sustained CMIF goals met  Plan: Pump refill today  Pre-op H&P Assessment:  Ms. Wire is a 75 y.o. (year old), female patient, seen today for interventional treatment. She  has a past surgical history that includes Carpal tunnel release (Bilateral); Back surgery; Breast surgery (Bilateral); Cholecystectomy; Colonoscopy w/ polypectomy; Cataract extraction w/ intraocular lens implant (Right); Abdominal hysterectomy; Fracture surgery (Right); Cardiac catheterization (2012); and Pain pump revision (Left, 07/07/2017). Ms. Klumb has a current medication list which includes the following prescription(s): albuterol, aspirin ec, atorvastatin, carvedilol, celecoxib, meloxicam, naloxone,  oxybutynin, amoxicillin, naloxegol oxalate, and ssd. Her primarily concern today is the Back Pain (low)  Initial Vital Signs:  Pulse/HCG Rate: 77  Temp: 98 F (36.7 C) Resp: 18 BP: (!) 142/77 SpO2: 97 %  BMI: Estimated body mass index is 31.09 kg/m as calculated from the following:   Height as of this encounter: '5\' 2"'  (1.575 m).   Weight as of this encounter: 170 lb (77.1 kg).  Risk Assessment: Allergies: Reviewed. She is allergic to doxycycline hyclate and sulfa antibiotics.  Allergy Precautions: None required Coagulopathies: Reviewed. None identified.  Blood-thinner therapy: None at this time Active Infection(s): Reviewed. None identified. Ms. Traub is afebrile  Site Confirmation: Ms. Lenig was asked to confirm the procedure and laterality before marking the site Procedure checklist: Completed Consent: Before the procedure and under the influence of no sedative(s), amnesic(s), or anxiolytics, the patient was informed of the treatment options, risks and possible complications. To fulfill our ethical and legal obligations, as recommended by the American Medical Association's Code of Ethics, I have informed the patient of my clinical impression; the nature and purpose of the treatment or procedure; the risks, benefits, and possible complications of the intervention; the alternatives, including doing nothing; the risk(s) and benefit(s) of the  alternative treatment(s) or procedure(s); and the risk(s) and benefit(s) of doing nothing.  Ms. Wince was provided with information about the general risks and possible complications associated with most interventional procedures. These include, but are not limited to: failure to achieve desired goals, infection, bleeding, organ or nerve damage, allergic reactions, paralysis, and/or death.  In addition, she was informed of those risks and possible complications associated to this particular procedure, which include, but are not limited to:  damage to the implant; failure to decrease pain; local, systemic, or serious CNS infections, intraspinal abscess with possible cord compression and paralysis, or life-threatening such as meningitis; bleeding; organ damage; nerve injury or damage with subsequent sensory, motor, and/or autonomic system dysfunction, resulting in transient or permanent pain, numbness, and/or weakness of one or several areas of the body; allergic reactions, either minor or major life-threatening, such as anaphylactic or anaphylactoid reactions.  Furthermore, Ms. Richardson was informed of those risks and complications associated with the medications. These include, but are not limited to: allergic reactions (i.e.: anaphylactic or anaphylactoid reactions); endorphine suppression; bradycardia and/or hypotension; water retention and/or peripheral vascular relaxation leading to lower extremity edema and possible stasis ulcers; respiratory depression and/or shortness of breath; decreased metabolic rate leading to weight gain; swelling or edema; medication-induced neural toxicity; particulate matter embolism and blood vessel occlusion with resultant organ, and/or nervous system infarction; and/or intrathecal granuloma formation with possible spinal cord compression and permanent paralysis.  Before refilling the pump Ms. Patrie was informed that some of the medications used in the devise may not be FDA approved for such use and therefore it constitutes an off-label use of the medications.  Finally, she was informed that Medicine is not an exact science; therefore, there is also the possibility of unforeseen or unpredictable risks and/or possible complications that may result in a catastrophic outcome. The patient indicated having understood very clearly. We have given the patient no guarantees and we have made no promises. Enough time was given to the patient to ask questions, all of which were answered to the patient's satisfaction. Ms.  Litsey has indicated that she wanted to continue with the procedure. Attestation: I, the ordering provider, attest that I have discussed with the patient the benefits, risks, side-effects, alternatives, likelihood of achieving goals, and potential problems during recovery for the procedure that I have provided informed consent. Date  Time: 11/24/2020 12:31 PM  Pre-Procedure Preparation:  Monitoring: As per clinic protocol. Respiration, ETCO2, SpO2, BP, heart rate and rhythm monitor placed and checked for adequate function Safety Precautions: Patient was assessed for positional comfort and pressure points before starting the procedure. Time-out: I initiated and conducted the "Time-out" before starting the procedure, as per protocol. The patient was asked to participate by confirming the accuracy of the "Time Out" information. Verification of the correct person, site, and procedure were performed and confirmed by me, the nursing staff, and the patient. "Time-out" conducted as per Joint Commission's Universal Protocol (UP.01.01.01). Time: 1257  Description of Procedure:          Position: Supine Target Area: Central-port of intrathecal pump. Approach: Anterior, 90 degree angle approach. Area Prepped: Entire Area around the pump implant. DuraPrep (Iodine Povacrylex [0.7% available iodine] and Isopropyl Alcohol, 74% w/w) Safety Precautions: Aspiration looking for blood return was conducted prior to all injections. At no point did we inject any substances, as a needle was being advanced. No attempts were made at seeking any paresthesias. Safe injection practices and needle disposal techniques used. Medications properly checked for  expiration dates. SDV (single dose vial) medications used. Description of the Procedure: Protocol guidelines were followed. Two nurses trained to do implant refills were present during the entire procedure. The refill medication was checked by both healthcare providers as well  as the patient. The patient was included in the "Time-out" to verify the medication. The patient was placed in position. The pump was identified. The area was prepped in the usual manner. The sterile template was positioned over the pump, making sure the side-port location matched that of the pump. Both, the pump and the template were held for stability. The needle provided in the Medtronic Kit was then introduced thru the center of the template and into the central port. The pump content was aspirated and discarded volume documented. The new medication was slowly infused into the pump, thru the filter, making sure to avoid overpressure of the device. The needle was then removed and the area cleansed, making sure to leave some of the prepping solution back to take advantage of its long term bactericidal properties. The pump was interrogated and programmed to reflect the correct medication, volume, and dosage. The program was printed and taken to the physician for approval. Once checked and signed by the physician, a copy was provided to the patient and another scanned into the EMR. Vitals:   11/24/20 1230 11/24/20 1231  BP:  (!) 142/77  Pulse:  77  Resp:  18  Temp:  98 F (36.7 C)  SpO2:  97%  Weight: 170 lb (77.1 kg)   Height: '5\' 2"'  (1.575 m)     Start Time: 1257 hrs. End Time: 1304 hrs. Materials & Medications: Medtronic Refill Kit Medication(s): Please see chart orders for details.  Imaging Guidance:          Type of Imaging Technique: None used Indication(s): N/A Exposure Time: No patient exposure Contrast: None used. Fluoroscopic Guidance: N/A Ultrasound Guidance: N/A Interpretation: N/A  Antibiotic Prophylaxis:   Anti-infectives (From admission, onward)    None      Indication(s): None identified  Post-operative Assessment:  Post-procedure Vital Signs:  Pulse/HCG Rate: 77  Temp: 98 F (36.7 C) Resp: 18 BP: (!) 142/77 SpO2: 97 %  EBL: None  Complications: No  immediate post-treatment complications observed by team, or reported by patient.  Note: The patient tolerated the entire procedure well. A repeat set of vitals were taken after the procedure and the patient was kept under observation following institutional policy, for this type of procedure. Post-procedural neurological assessment was performed, showing return to baseline, prior to discharge. The patient was provided with post-procedure discharge instructions, including a section on how to identify potential problems. Should any problems arise concerning this procedure, the patient was given instructions to immediately contact us, at any time, without hesitation. In any case, we plan to contact the patient by telephone for a follow-up status report regarding this interventional procedure.  Comments:  No additional relevant information.  Plan of Care  Orders:  Orders Placed This Encounter  Procedures   PUMP REFILL    Maintain Protocol by having two(2) healthcare providers during procedure and programming.    Scheduling Instructions:     Please refill intrathecal pump today.    Order Specific Question:   Where will this procedure be performed?    Answer:   [facility]   PUMP REFILL    Whenever possible schedule on a procedure today.    Standing Status:   Future    Standing Expiration Date:  04/22/2021    Scheduling Instructions:     Please schedule intrathecal pump refill based on pump programming. Avoid schedule intervals of more than 120 days (4 months).    Order Specific Question:   Where will this procedure be performed?    Answer:   [facility]   Informed Consent Details: Physician/Practitioner Attestation; Transcribe to consent form and obtain patient signature    Transcribe to consent form and obtain patient signature.    Order Specific Question:   Physician/Practitioner attestation of informed consent for procedure/surgical case    Answer:   I, the  physician/practitioner, attest that I have discussed with the patient the benefits, risks, side effects, alternatives, likelihood of achieving goals and potential problems during recovery for the procedure that I have provided informed consent.    Order Specific Question:   Procedure    Answer:   Intrathecal pump refill    Order Specific Question:   Physician/Practitioner performing the procedure    Answer:   Attending Physician: Kathlen Brunswick. Dossie Arbour, MD & designated trained staff    Order Specific Question:   Indication/Reason    Answer:   Chronic Pain Syndrome (G89.4), presence of an intrathecal pump (Z97.8)    Chronic Opioid Analgesic:  No other oral opioid analgesics prescribed by our practice. Intrathecal PF-Hydromorphone 3.9295 mg/day + PF-Sufentanil 34.383 mcg/day. MME: Aprox. 58.7 mg/day.   Medications ordered for procedure: Meds ordered this encounter  Medications   naloxegol oxalate (MOVANTIK) 25 MG TABS tablet    Sig: Take 1 tablet (25 mg total) by mouth daily. Swallow whole on empty stomach, 1 hour before or 2 hours after a meal. Do not break or chew tablet.    Dispense:  30 tablet    Refill:  11    Fill one day early if pharmacy is closed on scheduled refill date. May substitute for generic if available.   naloxone [facility]) nasal spray 4 mg/0.1 mL    Sig: Place 1 spray into the nose as needed for up to 365 doses (for opioid-induced respiratory depresssion). In case of emergency (overdose), spray once into each nostril. If no response within 3 minutes, repeat application and call [phone].    Dispense:  1 each    Refill:  0    Instruct patient in proper use of device.    Medications administered: Tiffany Hunter had no medications administered during this visit.  See the medical record for exact dosing, route, and time of administration.  Follow-up plan:   Return for Pump Refill (Max:68mo.      Interventional Therapies  Risk  Complexity Considerations:   Estimated body mass  index is 31.09 kg/m as calculated from the following:   Height as of this encounter: '5\' 2"'  (1.575 m).   Weight as of this encounter: 170 lb (77.1 kg). Presence of intrathecal pump  DM  CAD  Pulmonary hypertension    Planned  Pending:      Under consideration:   Diagnostic left lumbar facet MBB #3  Possible left lumbar facet RFA #1    Completed:   Palliative intrathecal pump refills  Diagnostic left lumbar facet block x2 (12/22/2016) (07/14/2020) (100/100/100/100)   Therapeutic  Palliative (PRN) options:   Palliative Intrathecal pump refill as per programming  Diagnostic left sided lumbar facet block #3       Recent Visits Date Type Provider Dept  09/10/20 Procedure visit NMilinda Pointer MD [facility]  Showing recent visits within past 90 days and meeting all other  requirements Today's Visits Date Type Provider Dept  11/24/20 Procedure visit Milinda Pointer, MD [facility]  Showing today's visits and meeting all other requirements Future Appointments Date Type Provider Dept  02/02/21 Appointment Milinda Pointer, MD [facility]  Showing future appointments within next 90 days and meeting all other requirements Disposition: Discharge home  Discharge (Date  Time): 11/24/2020;   hrs.   Primary Care Physician: Iva Lento, PA-C Location: [facility] Note by: Gaspar Cola, MD Date: 11/24/2020; Time: 1:25 PM  Disclaimer:  Medicine is not an Chief Strategy Officer. The only guarantee in medicine is that nothing is guaranteed. It is important to note that the decision to proceed with this intervention was based on the information collected from the patient. The Data and conclusions were drawn from the patient's questionnaire, the interview, and the physical examination. Because the information was provided in large part by the patient, it cannot be guaranteed that it has not been purposely or unconsciously  manipulated. Every effort has been made to obtain as much relevant data as possible for this evaluation. It is important to note that the conclusions that lead to this procedure are derived in large part from the available data. Always take into account that the treatment will also be dependent on availability of resources and existing treatment guidelines, considered by other Pain Management Practitioners as being common knowledge and practice, at the time of the intervention. For Medico-Legal purposes, it is also important to point out that variation in procedural techniques and pharmacological choices are the acceptable norm. The indications, contraindications, technique, and results of the above procedure should only be interpreted and judged by a Board-Certified Interventional Pain Specialist with extensive familiarity and expertise in the same exact procedure and technique.

## 2020-11-24 ENCOUNTER — Ambulatory Visit: Payer: Medicare Other | Attending: Pain Medicine | Admitting: Pain Medicine

## 2020-11-24 ENCOUNTER — Encounter: Payer: Self-pay | Admitting: Pain Medicine

## 2020-11-24 ENCOUNTER — Other Ambulatory Visit: Payer: Self-pay

## 2020-11-24 VITALS — BP 142/77 | HR 77 | Temp 98.0°F | Resp 18 | Ht 62.0 in | Wt 170.0 lb

## 2020-11-24 DIAGNOSIS — Z978 Presence of other specified devices: Secondary | ICD-10-CM

## 2020-11-24 DIAGNOSIS — M79605 Pain in left leg: Secondary | ICD-10-CM | POA: Diagnosis not present

## 2020-11-24 DIAGNOSIS — M5137 Other intervertebral disc degeneration, lumbosacral region: Secondary | ICD-10-CM | POA: Diagnosis not present

## 2020-11-24 DIAGNOSIS — M545 Low back pain, unspecified: Secondary | ICD-10-CM | POA: Diagnosis not present

## 2020-11-24 DIAGNOSIS — Z451 Encounter for adjustment and management of infusion pump: Secondary | ICD-10-CM

## 2020-11-24 DIAGNOSIS — M47816 Spondylosis without myelopathy or radiculopathy, lumbar region: Secondary | ICD-10-CM

## 2020-11-24 DIAGNOSIS — Z79891 Long term (current) use of opiate analgesic: Secondary | ICD-10-CM

## 2020-11-24 DIAGNOSIS — M961 Postlaminectomy syndrome, not elsewhere classified: Secondary | ICD-10-CM

## 2020-11-24 DIAGNOSIS — Z79899 Other long term (current) drug therapy: Secondary | ICD-10-CM

## 2020-11-24 DIAGNOSIS — G894 Chronic pain syndrome: Secondary | ICD-10-CM | POA: Diagnosis present

## 2020-11-24 DIAGNOSIS — M51379 Other intervertebral disc degeneration, lumbosacral region without mention of lumbar back pain or lower extremity pain: Secondary | ICD-10-CM

## 2020-11-24 DIAGNOSIS — G8929 Other chronic pain: Secondary | ICD-10-CM

## 2020-11-24 DIAGNOSIS — T402X5A Adverse effect of other opioids, initial encounter: Secondary | ICD-10-CM

## 2020-11-24 DIAGNOSIS — K5903 Drug induced constipation: Secondary | ICD-10-CM

## 2020-11-24 MED ORDER — NALOXEGOL OXALATE 25 MG PO TABS: 25.0000 mg | ORAL_TABLET | Freq: Every day | ORAL | 11 refills | Status: AC

## 2020-11-24 MED ORDER — NALOXONE HCL 4 MG/0.1ML NA LIQD: 1.0000 | NASAL | 0 refills | Status: DC | PRN

## 2020-11-24 NOTE — Patient Instructions (Signed)
Opioid Overdose Opioids are drugs that are often used to treat pain. Opioids include illegal drugs, such as heroin, as well as prescription pain medicines, such as codeine, morphine, hydrocodone, oxycodone, and fentanyl. An opioid overdose happens when you take too much of an opioid. An overdose may be intentional or accidentaland can happen with any type of opioid. The effects of an overdose can be mild, dangerous, or even deadly. Opioidoverdose is a medical emergency. What are the causes? This condition may be caused by: Taking too much of an opioid on purpose. Taking too much of an opioid by accident. Using two or more substances that contain opioids at the same time. Taking an opioid with a substance that affects your heart, breathing, or blood pressure. These include alcohol, tranquilizers, sleeping pills, illegal drugs, and some over-the-counter medicines. This condition may also happen due to an error made by: A health care provider who prescribes a medicine. The pharmacist who fills the prescription order. What increases the risk? This condition is more likely in: Children. They may be attracted to colorful pills. Because of a child's small size, even a small amount of a drug can be dangerous. Older people. They may be taking many different drugs. Older people may have difficulty reading labels or remembering when they last took their medicine. They may also be more sensitive to the effects of opioids. People with chronic medical conditions, especially heart, liver, kidney, or neurological diseases. People who take an opioid for a long period of time. People who use: Illegal drugs. IV heroin is especially dangerous. Other substances, including alcohol, while using an opioid. People who have: A history of drug or alcohol abuse. Certain mental health conditions. A history of previous drug overdoses. People who take opioids that are not prescribed for them. What are the signs or  symptoms? Symptoms of this condition depend on the type of opioid and the amount that was taken. Common symptoms include: Sleepiness or difficulty waking from sleep. Decrease in attention. Confusion. Slurred speech. Slowed breathing and a slow pulse (bradycardia). Nausea and vomiting. Abnormally small pupils. Signs and symptoms that require emergency treatment include: Cold, clammy, and pale skin. Blue lips and fingernails. Vomiting. Gurgling sounds in the throat. A pulse that is very slow or difficult to detect. Breathing that is very irregular, slow, noisy, or difficult to detect. Limp body. Inability to respond to speech or be awakened from sleep (stupor). Seizures. How is this diagnosed? This condition is diagnosed based on your symptoms and medical history. It is important to tell your health care provider: About all of the opioids that you took. When you took the opioids. Whether you were drinking alcohol or using marijuana, cocaine, or other drugs. Your health care provider will do a physical exam. This exam may include: Checking and monitoring your heart rate and rhythm, breathing rate, temperature, and blood pressure (vital signs). Measuring oxygen levels in your blood. Checking for abnormally small pupils. You may also have blood tests or urine tests. You may have X-rays if you arehaving severe breathing problems. How is this treated? This condition requires immediate medical treatment and hospitalization. Treatment is given in the hospital intensive care (ICU) setting. Supporting your vital signs and your breathing is the first step in treating an opioid overdose. Treatment may also include: Giving salts and minerals (electrolytes) along with fluids through an IV. Inserting a breathing tube (endotracheal tube) in your airway to help you breathe if you cannot breathe on your own or you are in   danger of not being able to breathe on your own. Giving oxygen through a small  tube under your nose. Passing a tube through your nose and into your stomach (nasogastric tube, or NG tube) to empty your stomach. Giving medicines that: Increase your blood pressure. Relieve nausea and vomiting. Relieve abdominal pain and cramping. Reverse the effects of the opioid (naloxone). Monitoring your heart and oxygen levels. Ongoing counseling and mental health support if you intentionally overdosed or used an illegal drug. Follow these instructions at home:  Medicines Take over-the-counter and prescription medicines only as told by your health care provider. Always ask your health care provider about possible side effects and interactions of any new medicine that you start taking. Keep a list of all the medicines that you take, including over-the-counter medicines. Bring this list with you to all your medical visits. General instructions Drink enough fluid to keep your urine pale yellow. Keep all follow-up visits as told by your health care provider. This is important. How is this prevented? Read the drug inserts that come with your opioid pain medicines. Take medicines only as told by your health care provider. Do not take more medicine than you are told. Do not take medicines more frequently than you are told. Do not drink alcohol or take sedatives when taking opioids. Do not use illegal or recreational drugs, including cocaine, ecstasy, and marijuana. Do not take opioid medicines that are not prescribed for you. Store all medicines in safety containers that are out of the reach of children. Get help if you are struggling with: Alcohol or drug use. Depression or another mental health problem. Thoughts of hurting yourself or another person. Keep the phone number of your local poison control center near your phone or in your mobile phone. In the [place] the hotline of the National Poison Control Center is (800) 222-1222. If you were prescribed naloxone, make sure you understand  how to take it. Contact a health care provider if you: Need help understanding how to take your pain medicines. Feel your medicines are too strong. Are concerned that your pain medicines are not working well for your pain. Develop new symptoms or side effects when you are taking medicines. Get help right away if: You or someone else is having symptoms of an opioid overdose. Get help even if you are not sure. You have serious thoughts about hurting yourself or others. You have: Chest pain. Difficulty breathing. A loss of consciousness. These symptoms may represent a serious problem that is an emergency. Do not wait to see if the symptoms will go away. Get medical help right away. Call your local emergency services (911 in the [place] Do not drive yourself to the hospital. If you ever feel like you may hurt yourself or others, or have thoughts about taking your own life, get help right away. You can go to your nearest emergency department or call: Your local emergency services (911 in the [place] A suicide crisis helpline, such as the National Suicide Prevention Lifeline at 1-800-273-8255. This is open 24 hours a day. Summary Opioids are drugs that are often used to treat pain. Opioids include illegal drugs, such as heroin, as well as prescription pain medicines. An opioid overdose happens when you take too much of an opioid. Overdoses can be intentional or accidental. Opioid overdose is very dangerous. It is a life-threatening emergency. If you or someone you know is experiencing an opioid overdose, get help right away. This information is not intended to replace advice   given to you by your health care provider. Make sure you discuss any questions you have with your healthcare provider. Document Revised: 04/19/2018 Document Reviewed: 04/19/2018 [employer] Patient Education  2022 [employer]  

## 2020-11-25 ENCOUNTER — Telehealth: Payer: Self-pay | Admitting: *Deleted

## 2020-11-25 ENCOUNTER — Telehealth: Payer: Self-pay

## 2020-11-25 NOTE — Telephone Encounter (Signed)
No problems post pump fill. 

## 2020-11-26 MED FILL — Medication: INTRATHECAL | Qty: 1 | Status: AC

## 2021-01-05 ENCOUNTER — Other Ambulatory Visit: Payer: Self-pay

## 2021-01-05 MED ORDER — PAIN MANAGEMENT IT PUMP REFILL: 1.0000 | Freq: Once | INTRATHECAL | 0 refills | Status: AC

## 2021-01-22 ENCOUNTER — Other Ambulatory Visit: Payer: Self-pay

## 2021-01-22 MED ORDER — PAIN MANAGEMENT IT PUMP REFILL: 1.0000 | Freq: Once | INTRATHECAL | 0 refills | Status: AC

## 2021-01-26 DIAGNOSIS — M19019 Primary osteoarthritis, unspecified shoulder: Secondary | ICD-10-CM | POA: Insufficient documentation

## 2021-01-30 NOTE — Progress Notes (Signed)
PROVIDER NOTE: Information contained herein reflects review and annotations entered in association with encounter. Interpretation of such information and data should be left to medically-trained personnel. Information provided to patient can be located elsewhere in the medical record under "Patient Instructions". Document created using STT-dictation technology, any transcriptional errors that may result from process are unintentional.    Patient: Tiffany Hunter  Service Category: Procedure  Provider: Gaspar Cola, MD  DOB: [birth year]  DOS: 02/02/2021  Location: [facility]  MRN: [id number]  Setting: Ambulatory - outpatient  Referring Provider: Iva Lento, PA-C  Type: Established Patient  Specialty: Interventional Pain Management  PCP: Tiffany Lento, PA-C   Primary Reason for Visit: Interventional Pain Management Treatment. CC: Back Pain (low), Shoulder Pain (left), and Knee Pain   Procedure:          Type: Management of infusion pump - Reservoir Refill [phone]). No rate change.    Indications: 1. Chronic pain syndrome   2. 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 Lumbar facet joint syndrome (Left)   6. DDD (degenerative disc disease), lumbosacral   7. Pharmacologic therapy   8. 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 Encounter for adjustment or management of infusion pump   10. Chronic use of opiate for therapeutic purpose   11. Opioid-induced constipation (OIC)   12. Encounter for therapeutic procedure   13. Encounter for chronic pain management    Pain Assessment: Self-Reported Pain Score: 10-Worst pain ever/10             Reported level is compatible with observation.        Procedure:           Intrathecal Drug Delivery System (IDDS):  Type: Reservoir Refill [phone])        Region: Abdominal Laterality: Left   Type of Pump: Medtronic Synchromed II Delivery Route: Intrathecal Type of Pain Treated: Neuropathic/Nociceptive Primary Medication Class: Opioid/opiate  Medication, Concentration, Infusion Program, & Delivery Rate: Please see scanned programming printout.      Pharmacotherapy Assessment   Analgesic: No other oral opioid analgesics prescribed by our practice. Intrathecal PF-Hydromorphone 3.9295 mg/day + PF-Sufentanil 34.383 mcg/day. MME: Aprox. 58.7 mg/day.   Monitoring: [place] PMP: PDMP reviewed during this encounter.       Pharmacotherapy: No side-effects or adverse reactions reported. Compliance: No problems identified. Effectiveness: Clinically acceptable. Plan: Refer to "POC". UDS:  Summary  Date Value Ref Range Status  07/30/2019 Note  Final    Comment:    ==================================================================== Compliance Drug Analysis, Ur ==================================================================== Test                             Result       Flag       Units Drug Present not Declared for Prescription Verification   Hydromorphone                  1757         UNEXPECTED ng/mg creat    Hydromorphone may be administered as a scheduled prescription    medication; it is also an expected metabolite of hydrocodone.   Naproxen                       PRESENT  UNEXPECTED Drug Absent but Declared for Prescription Verification   Salicylate                     Not Detected UNEXPECTED    Aspirin, as indicated in the declared medication list, is not always    detected even when used as directed. ==================================================================== Test                      Result    Flag   Units      Ref Range   Creatinine              182              mg/dL      >=20 ==================================================================== Declared Medications:  The flagging and interpretation on this report are  based on the  following declared medications.  Unexpected results may arise from  inaccuracies in the declared medications.  **Note: The testing scope of this panel does not include small to  moderate amounts of these reported medications:  Aspirin  **Note: The testing scope of this panel does not include the  following reported medications:  Albuterol (Ventolin HFA)  Atorvastatin (Lipitor)  Betamethasone (Lotrisone)  Carvedilol (Coreg)  Cholecalciferol  Clotrimazole (Lotrisone)  Hydrocortisone  Mirabegron (Myrbetriq)  Naloxegol (Movantik)  Oxybutynin (Ditropan)  Supplement ==================================================================== For clinical consultation, please call [phone]. ====================================================================         Intrathecal Pump Therapy Assessment  Manufacturer: Medtronic Synchromed Type: Programmable Volume: 40 mL reservoir MRI compatibility: Yes    Drug content:  Primary Medication Class: Opioid Primary Medication: PF-Hydromorphone (Dilaudid) (8 mg/mL)  Secondary Medication: PF-Sufentanil (70 g/mL)  Other Medication: No third    Programming:  Type: Simple continuous. See pump readout for details.      Changes:  Medication Change: None at this point Rate Change: No change in rate  Reported side-effects or adverse reactions: None reported  Effectiveness: Described as relatively effective, allowing for increase in activities of daily living (ADL) Clinically meaningful improvement in function (CMIF): Sustained CMIF goals met  Plan: Pump refill today   Pre-op H&P Assessment:  Tiffany Hunter is a 75 y.o. (year old), female patient, seen today for interventional treatment. She  has a past surgical history that includes Carpal tunnel release (Bilateral); Back surgery; Breast surgery (Bilateral); Cholecystectomy; Colonoscopy w/ polypectomy; Cataract extraction w/ intraocular lens implant (Right); Abdominal  hysterectomy; Fracture surgery (Right); Cardiac catheterization (2012); and Pain pump revision (Left, 07/07/2017). Tiffany Hunter has a current medication list which includes the following prescription(s): albuterol, aspirin ec, atorvastatin, carvedilol, celecoxib, meloxicam, naloxegol oxalate, naloxone, NON FORMULARY, oxybutynin, and ssd. Her primarily concern today is the Back Pain (low), Shoulder Pain (left), and Knee Pain  Initial Vital Signs:  Pulse/HCG Rate: 71  Temp: (!) 97.2 F (36.2 C) Resp: 18 BP: (!) 161/82 SpO2: 100 %  BMI: Estimated body mass index is 32.92 kg/m as calculated from the following:   Height as of this encounter: '5\' 2"'  (1.575 m).   Weight as of this encounter: 180 lb (81.6 kg).  Risk Assessment: Allergies: Reviewed. She is allergic to doxycycline hyclate and sulfa antibiotics.  Allergy Precautions: None required Coagulopathies: Reviewed. None identified.  Blood-thinner therapy: None at this time Active Infection(s): Reviewed. None identified. Ms. Bertagnolli is afebrile  Site Confirmation: Ms. Branan was asked to confirm the procedure and laterality before marking the site Procedure checklist: Completed Consent: Before the procedure and under the influence of no sedative(s),  amnesic(s), or anxiolytics, the patient was informed of the treatment options, risks and possible complications. To fulfill our ethical and legal obligations, as recommended by the American Medical Association's Code of Ethics, I have informed the patient of my clinical impression; the nature and purpose of the treatment or procedure; the risks, benefits, and possible complications of the intervention; the alternatives, including doing nothing; the risk(s) and benefit(s) of the alternative treatment(s) or procedure(s); and the risk(s) and benefit(s) of doing nothing.  Ms. Holladay was provided with information about the general risks and possible complications associated with most interventional  procedures. These include, but are not limited to: failure to achieve desired goals, infection, bleeding, organ or nerve damage, allergic reactions, paralysis, and/or death.  In addition, she was informed of those risks and possible complications associated to this particular procedure, which include, but are not limited to: damage to the implant; failure to decrease pain; local, systemic, or serious CNS infections, intraspinal abscess with possible cord compression and paralysis, or life-threatening such as meningitis; bleeding; organ damage; nerve injury or damage with subsequent sensory, motor, and/or autonomic system dysfunction, resulting in transient or permanent pain, numbness, and/or weakness of one or several areas of the body; allergic reactions, either minor or major life-threatening, such as anaphylactic or anaphylactoid reactions.  Furthermore, Ms. Fullard was informed of those risks and complications associated with the medications. These include, but are not limited to: allergic reactions (i.e.: anaphylactic or anaphylactoid reactions); endorphine suppression; bradycardia and/or hypotension; water retention and/or peripheral vascular relaxation leading to lower extremity edema and possible stasis ulcers; respiratory depression and/or shortness of breath; decreased metabolic rate leading to weight gain; swelling or edema; medication-induced neural toxicity; particulate matter embolism and blood vessel occlusion with resultant organ, and/or nervous system infarction; and/or intrathecal granuloma formation with possible spinal cord compression and permanent paralysis.  Before refilling the pump Ms. Rhude was informed that some of the medications used in the devise may not be FDA approved for such use and therefore it constitutes an off-label use of the medications.  Finally, she was informed that Medicine is not an exact science; therefore, there is also the possibility of unforeseen or  unpredictable risks and/or possible complications that may result in a catastrophic outcome. The patient indicated having understood very clearly. We have given the patient no guarantees and we have made no promises. Enough time was given to the patient to ask questions, all of which were answered to the patient's satisfaction. Ms. Mahan has indicated that she wanted to continue with the procedure. Attestation: I, the ordering provider, attest that I have discussed with the patient the benefits, risks, side-effects, alternatives, likelihood of achieving goals, and potential problems during recovery for the procedure that I have provided informed consent. Date  Time: 02/02/2021  1:21 PM  Pre-Procedure Preparation:  Monitoring: As per clinic protocol. Respiration, ETCO2, SpO2, BP, heart rate and rhythm monitor placed and checked for adequate function Safety Precautions: Patient was assessed for positional comfort and pressure points before starting the procedure. Time-out: I initiated and conducted the "Time-out" before starting the procedure, as per protocol. The patient was asked to participate by confirming the accuracy of the "Time Out" information. Verification of the correct person, site, and procedure were performed and confirmed by me, the nursing staff, and the patient. "Time-out" conducted as per Joint Commission's Universal Protocol (UP.01.01.01). Time: 1339  Description of Procedure:          Position: Supine Target Area: Central-port of intrathecal pump. Approach:  Anterior, 90 degree angle approach. Area Prepped: Entire Area around the pump implant. DuraPrep (Iodine Povacrylex [0.7% available iodine] and Isopropyl Alcohol, 74% w/w) Safety Precautions: Aspiration looking for blood return was conducted prior to all injections. At no point did we inject any substances, as a needle was being advanced. No attempts were made at seeking any paresthesias. Safe injection practices and needle  disposal techniques used. Medications properly checked for expiration dates. SDV (single dose vial) medications used. Description of the Procedure: Protocol guidelines were followed. Two nurses trained to do implant refills were present during the entire procedure. The refill medication was checked by both healthcare providers as well as the patient. The patient was included in the "Time-out" to verify the medication. The patient was placed in position. The pump was identified. The area was prepped in the usual manner. The sterile template was positioned over the pump, making sure the side-port location matched that of the pump. Both, the pump and the template were held for stability. The needle provided in the Medtronic Kit was then introduced thru the center of the template and into the central port. The pump content was aspirated and discarded volume documented. The new medication was slowly infused into the pump, thru the filter, making sure to avoid overpressure of the device. The needle was then removed and the area cleansed, making sure to leave some of the prepping solution back to take advantage of its long term bactericidal properties. The pump was interrogated and programmed to reflect the correct medication, volume, and dosage. The program was printed and taken to the physician for approval. Once checked and signed by the physician, a copy was provided to the patient and another scanned into the EMR. Vitals:   02/02/21 1319  BP: (!) 161/82  Pulse: 71  Resp: 18  Temp: (!) 97.2 F (36.2 C)  SpO2: 100%  Weight: 180 lb (81.6 kg)  Height: '5\' 2"'  (1.575 m)    Start Time: 1339 hrs. End Time: 1345 hrs. Materials & Medications: Medtronic Refill Kit Medication(s): Please see chart orders for details.  Imaging Guidance:          Type of Imaging Technique: None used Indication(s): N/A Exposure Time: No patient exposure Contrast: None used. Fluoroscopic Guidance: N/A Ultrasound Guidance:  N/A Interpretation: N/A  Antibiotic Prophylaxis:   Anti-infectives (From admission, onward)    None      Indication(s): None identified  Post-operative Assessment:  Post-procedure Vital Signs:  Pulse/HCG Rate: 71  Temp: (!) 97.2 F (36.2 C) Resp: 18 BP: (!) 161/82 SpO2: 100 %  EBL: None  Complications: No immediate post-treatment complications observed by team, or reported by patient.  Note: The patient tolerated the entire procedure well. A repeat set of vitals were taken after the procedure and the patient was kept under observation following institutional policy, for this type of procedure. Post-procedural neurological assessment was performed, showing return to baseline, prior to discharge. The patient was provided with post-procedure discharge instructions, including a section on how to identify potential problems. Should any problems arise concerning this procedure, the patient was given instructions to immediately contact us, at any time, without hesitation. In any case, we plan to contact the patient by telephone for a follow-up status report regarding this interventional procedure.  Comments:  No additional relevant information.  Plan of Care  Orders:  Orders Placed This Encounter  Procedures   PUMP REFILL    Maintain Protocol by having two(2) healthcare providers during procedure and programming.  Scheduling Instructions:     Please refill intrathecal pump today.    Order Specific Question:   Where will this procedure be performed?    Answer:   [facility]   PUMP REFILL    Whenever possible schedule on a procedure today.    Standing Status:   Future    Standing Expiration Date:   06/04/2021    Scheduling Instructions:     Please schedule intrathecal pump refill based on pump programming. Avoid schedule intervals of more than 120 days (4 months).    Order Specific Question:   Where will this procedure be performed?    Answer:   [facility]    Informed Consent Details: Physician/Practitioner Attestation; Transcribe to consent form and obtain patient signature    Transcribe to consent form and obtain patient signature.    Order Specific Question:   Physician/Practitioner attestation of informed consent for procedure/surgical case    Answer:   I, the physician/practitioner, attest that I have discussed with the patient the benefits, risks, side effects, alternatives, likelihood of achieving goals and potential problems during recovery for the procedure that I have provided informed consent.    Order Specific Question:   Procedure    Answer:   Intrathecal pump refill    Order Specific Question:   Physician/Practitioner performing the procedure    Answer:   Attending Physician: Kathlen Brunswick. Dossie Arbour, MD & designated trained staff    Order Specific Question:   Indication/Reason    Answer:   Chronic Pain Syndrome (G89.4), presence of an intrathecal pump (Z97.8)    Chronic Opioid Analgesic:  No other oral opioid analgesics prescribed by our practice. Intrathecal PF-Hydromorphone 3.9295 mg/day + PF-Sufentanil 34.383 mcg/day. MME: Aprox. 58.7 mg/day.   Medications ordered for procedure: No orders of the defined types were placed in this encounter.  Medications administered: Tiffany Hunter had no medications administered during this visit.  See the medical record for exact dosing, route, and time of administration.  Follow-up plan:   Return for Pump Refill (Max:86mo.       Interventional Therapies  Risk  Complexity Considerations:   Estimated body mass index is 31.09 kg/m as calculated from the following:   Height as of this encounter: '5\' 2"'  (1.575 m).   Weight as of this encounter: 170 lb (77.1 kg). Presence of intrathecal pump  DM  CAD  Pulmonary hypertension    Planned  Pending:      Under consideration:   Diagnostic left lumbar facet MBB #3  Possible left lumbar facet RFA #1    Completed:   Palliative intrathecal  pump refills  Diagnostic left lumbar facet block x2 (12/22/2016) (07/14/2020) (100/100/100/100)   Therapeutic  Palliative (PRN) options:   Palliative Intrathecal pump refill as per programming  Diagnostic left sided lumbar facet block #3        Recent Visits Date Type Provider Dept  11/24/20 Procedure visit NMilinda Pointer MD [facility]  Showing recent visits within past 90 days and meeting all other requirements Today's Visits Date Type Provider Dept  02/02/21 Procedure visit NMilinda Pointer MD [facility]  Showing today's visits and meeting all other requirements Future Appointments Date Type Provider Dept  04/15/21 Appointment NMilinda Pointer MD [facility]  Showing future appointments within next 90 days and meeting all other requirements Disposition: Discharge home  Discharge (Date  Time): 02/02/2021; 1350 hrs.   Primary Care Physician: WIva Lento PA-C [facility] Note by:  Tiffany Cola, MD Date: 02/02/2021; Time: 2:01 PM  Disclaimer:  Medicine is not an Chief Strategy Officer. The only guarantee in medicine is that nothing is guaranteed. It is important to note that the decision to proceed with this intervention was based on the information collected from the patient. The Data and conclusions were drawn from the patient's questionnaire, the interview, and the physical examination. Because the information was provided in large part by the patient, it cannot be guaranteed that it has not been purposely or unconsciously manipulated. Every effort has been made to obtain as much relevant data as possible for this evaluation. It is important to note that the conclusions that lead to this procedure are derived in large part from the available data. Always take into account that the treatment will also be dependent on availability of resources and existing treatment guidelines, considered by other Pain  Management Practitioners as being common knowledge and practice, at the time of the intervention. For Medico-Legal purposes, it is also important to point out that variation in procedural techniques and pharmacological choices are the acceptable norm. The indications, contraindications, technique, and results of the above procedure should only be interpreted and judged by a Board-Certified Interventional Pain Specialist with extensive familiarity and expertise in the same exact procedure and technique.

## 2021-02-02 ENCOUNTER — Other Ambulatory Visit: Payer: Self-pay

## 2021-02-02 ENCOUNTER — Ambulatory Visit: Payer: Medicare Other | Attending: Pain Medicine | Admitting: Pain Medicine

## 2021-02-02 ENCOUNTER — Encounter: Payer: Self-pay | Admitting: Pain Medicine

## 2021-02-02 VITALS — BP 161/82 | HR 71 | Temp 97.2°F | Resp 18 | Ht 62.0 in | Wt 180.0 lb

## 2021-02-02 DIAGNOSIS — Z79899 Other long term (current) drug therapy: Secondary | ICD-10-CM

## 2021-02-02 DIAGNOSIS — M47819 Spondylosis without myelopathy or radiculopathy, site unspecified: Secondary | ICD-10-CM | POA: Insufficient documentation

## 2021-02-02 DIAGNOSIS — Z882 Allergy status to sulfonamides status: Secondary | ICD-10-CM | POA: Insufficient documentation

## 2021-02-02 DIAGNOSIS — M961 Postlaminectomy syndrome, not elsewhere classified: Secondary | ICD-10-CM

## 2021-02-02 DIAGNOSIS — Z79891 Long term (current) use of opiate analgesic: Secondary | ICD-10-CM | POA: Insufficient documentation

## 2021-02-02 DIAGNOSIS — G8929 Other chronic pain: Secondary | ICD-10-CM

## 2021-02-02 DIAGNOSIS — M79605 Pain in left leg: Secondary | ICD-10-CM

## 2021-02-02 DIAGNOSIS — M5137 Other intervertebral disc degeneration, lumbosacral region: Secondary | ICD-10-CM | POA: Insufficient documentation

## 2021-02-02 DIAGNOSIS — M47816 Spondylosis without myelopathy or radiculopathy, lumbar region: Secondary | ICD-10-CM

## 2021-02-02 DIAGNOSIS — Z5189 Encounter for other specified aftercare: Secondary | ICD-10-CM

## 2021-02-02 DIAGNOSIS — Z451 Encounter for adjustment and management of infusion pump: Secondary | ICD-10-CM | POA: Insufficient documentation

## 2021-02-02 DIAGNOSIS — M5442 Lumbago with sciatica, left side: Secondary | ICD-10-CM | POA: Insufficient documentation

## 2021-02-02 DIAGNOSIS — K5903 Drug induced constipation: Secondary | ICD-10-CM

## 2021-02-02 DIAGNOSIS — Z978 Presence of other specified devices: Secondary | ICD-10-CM

## 2021-02-02 DIAGNOSIS — G894 Chronic pain syndrome: Secondary | ICD-10-CM | POA: Diagnosis not present

## 2021-02-02 NOTE — Patient Instructions (Addendum)
Give surgeon letter to your doctor doing the shoulder surgery.   Husband states they have a Narcan nasal spray at home.  Instructed husband to open box and become familiar with how to use it.  States he will do so. Naloxone nasal spray What is this medication? NALOXONE (nal OX one) is a narcotic blocker. It is used to treat narcotic drug overdose. This medicine may be used for other purposes; ask your health care provider or pharmacist if you have questions. COMMON BRAND NAME(S): Kloxxado, Narcan What should I tell my care team before I take this medication? They need to know if you have any of these conditions: drug abuse or addiction heart disease an unusual or allergic reaction to naloxone, other medicines, foods, dyes, or preservatives pregnant or trying to get pregnant breast-feeding How should I use this medication? This medicine is for use in the nose. Lay the person on their back. Support their neck with your hand and allow the head to tilt back before giving the medicine. The nasal spray should be given into 1 nostril. After giving the medicine, move the person onto their side. Do not remove or test the nasal spray until ready to use. Get emergency medical help right away after giving the first dose of this medicine, even if the person wakes up. You should be familiar with how to recognize the signs and symptoms of a narcotic overdose. If more doses are needed, give the additional dose in the other nostril. Talk to your pediatrician regarding the use of this medicine in children. While this drug may be prescribed for children as young as newborns for selected conditions, precautions do apply. Overdosage: If you think you have taken too much of this medicine contact a poison control center or emergency room at once. NOTE: This medicine is only for you. Do not share this medicine with others. What if I miss a dose? This does not apply. What may interact with this medication? This  medicine is only used during an emergency. No interactions are expected during emergency use. This list may not describe all possible interactions. Give your health care provider a list of all the medicines, herbs, non-prescription drugs, or dietary supplements you use. Also tell them if you smoke, drink alcohol, or use illegal drugs. Some items may interact with your medicine. What should I watch for while using this medication? Keep this medicine ready for use in the case of a narcotic overdose. Make sure that you have the phone number of your doctor or health care professional and local hospital ready. You may need to have additional doses of this medicine. Each nasal spray contains a single dose. Some emergencies may require additional doses. After use, bring the treated person to the nearest hospital or call 911. Make sure the treating health care professional knows that the person has received an injection of this medicine. You will receive additional instructions on what to do during and after use of this medicine before an emergency occurs. What side effects may I notice from receiving this medication? Side effects that you should report to your doctor or health care professional as soon as possible: allergic reactions like skin rash, itching or hives, swelling of the face, lips, or tongue breathing problems fast, irregular heartbeat high blood pressure pain that was controlled by narcotic pain medicine seizures Side effects that usually do not require medical attention (report to your doctor or health care professional if they continue or are bothersome): anxious chills diarrhea fever  headache muscle pain nausea, vomiting nose irritation like dryness, congestion, and swelling sweating This list may not describe all possible side effects. Call your doctor for medical advice about side effects. You may report side effects to FDA at 1-800-FDA-1088. Where should I keep my  medication? Keep out of the reach of children and pets. Store between 20 and 25 degrees C (68 and 77 degrees F). Do not freeze. Throw away any unused medicine after the expiration date. Keep in original box until ready to use. NOTE: This sheet is a summary. It may not cover all possible information. If you have questions about this medicine, talk to your doctor, pharmacist, or health care provider.  2022 [employer] (2019-09-17 12:08:55)

## 2021-02-03 ENCOUNTER — Telehealth: Payer: Self-pay

## 2021-02-03 NOTE — Telephone Encounter (Signed)
Post IT pump refill phone call.  Husband states she is doin gwell.

## 2021-03-25 ENCOUNTER — Other Ambulatory Visit: Payer: Self-pay

## 2021-03-25 MED ORDER — PAIN MANAGEMENT IT PUMP REFILL: 1.0000 | Freq: Once | INTRATHECAL | 0 refills | Status: AC

## 2021-03-29 DIAGNOSIS — Z01818 Encounter for other preprocedural examination: Secondary | ICD-10-CM | POA: Insufficient documentation

## 2021-04-05 DIAGNOSIS — Z96612 Presence of left artificial shoulder joint: Secondary | ICD-10-CM | POA: Insufficient documentation

## 2021-04-15 ENCOUNTER — Other Ambulatory Visit: Payer: Self-pay

## 2021-04-15 ENCOUNTER — Ambulatory Visit: Payer: Medicare Other | Attending: Pain Medicine | Admitting: Pain Medicine

## 2021-04-15 ENCOUNTER — Encounter: Payer: Self-pay | Admitting: Pain Medicine

## 2021-04-15 VITALS — BP 133/67 | HR 77 | Temp 96.3°F | Resp 18 | Ht 62.5 in | Wt 180.0 lb

## 2021-04-15 DIAGNOSIS — M47816 Spondylosis without myelopathy or radiculopathy, lumbar region: Secondary | ICD-10-CM | POA: Diagnosis present

## 2021-04-15 DIAGNOSIS — G894 Chronic pain syndrome: Secondary | ICD-10-CM | POA: Diagnosis present

## 2021-04-15 DIAGNOSIS — M5442 Lumbago with sciatica, left side: Secondary | ICD-10-CM | POA: Insufficient documentation

## 2021-04-15 DIAGNOSIS — M961 Postlaminectomy syndrome, not elsewhere classified: Secondary | ICD-10-CM | POA: Diagnosis present

## 2021-04-15 DIAGNOSIS — Z451 Encounter for adjustment and management of infusion pump: Secondary | ICD-10-CM

## 2021-04-15 DIAGNOSIS — G8929 Other chronic pain: Secondary | ICD-10-CM | POA: Diagnosis present

## 2021-04-15 DIAGNOSIS — M79605 Pain in left leg: Secondary | ICD-10-CM | POA: Diagnosis present

## 2021-04-15 DIAGNOSIS — Z79899 Other long term (current) drug therapy: Secondary | ICD-10-CM

## 2021-04-15 DIAGNOSIS — Z79891 Long term (current) use of opiate analgesic: Secondary | ICD-10-CM

## 2021-04-15 DIAGNOSIS — K5903 Drug induced constipation: Secondary | ICD-10-CM | POA: Diagnosis present

## 2021-04-15 DIAGNOSIS — Z978 Presence of other specified devices: Secondary | ICD-10-CM

## 2021-04-15 DIAGNOSIS — T402X5A Adverse effect of other opioids, initial encounter: Secondary | ICD-10-CM | POA: Diagnosis present

## 2021-04-15 DIAGNOSIS — M51379 Other intervertebral disc degeneration, lumbosacral region without mention of lumbar back pain or lower extremity pain: Secondary | ICD-10-CM

## 2021-04-15 DIAGNOSIS — M5137 Other intervertebral disc degeneration, lumbosacral region: Secondary | ICD-10-CM | POA: Insufficient documentation

## 2021-04-15 NOTE — Progress Notes (Signed)
Safety precautions to be maintained throughout the outpatient stay will include: orient to surroundings, keep bed in low position, maintain call bell within reach at all times, provide assistance with transfer out of bed and ambulation.  

## 2021-04-15 NOTE — Progress Notes (Signed)
PROVIDER NOTE: Information contained herein reflects review and annotations entered in association with encounter. Interpretation of such information and data should be left to medically-trained personnel. Information provided to patient can be located elsewhere in the medical record under "Patient Instructions". Document created using STT-dictation technology, any transcriptional errors that may result from process are unintentional.    Patient: Tiffany Hunter  Service Category: Procedure Provider: Gaspar Cola, MD DOB: 1945/06/10 DOS: 04/15/2021 Location: [facility] MRN: [id number] Setting: Ambulatory - outpatient Referring Provider: Iva Lento, PA-C Type: Established Patient Specialty: Interventional Pain Management PCP: Iva Lento, PA-C  Primary Reason for Visit: Interventional Pain Management Treatment. CC: Back Pain (Left )  Procedure:          Type: Management of Intrathecal Drug Delivery System (IDDS) - Reservoir Refill [phone]). No rate change.  Indications: 1. Chronic pain syndrome   2. 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 Lumbar facet joint syndrome (Left)   6. DDD (degenerative disc disease), lumbosacral   7. Pharmacologic therapy   8. 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 Encounter for adjustment or management of infusion pump   10. Chronic use of opiate for therapeutic purpose   11. Opioid-induced constipation (OIC)    Pain Assessment: Self-Reported Pain Score: 7 /10             Reported level is compatible with observation.         Intrathecal Drug Delivery System (IDDS)  Pump Device:  Manufacturer: Medtronic Model: Synchromed II Model No.: [id number] Serial No.: [id number] H Delivery Route: Intrathecal Type: Programmable  Volume (mL): 40 mL  reservoir Priming Volume: N/A  Calibration Constant: 114.0  MRI compatibility: Conditional   Implant Details:  Date: 07/07/2017 Implanter: Roderic Palau, MD Contact Information: [facility] [phone] Last Revision/Replacement: 07/07/2017 Estimated Replacement Date: Nov/2025  Implant Site: Abdominal Laterality: Left  Catheter: Manufacturer: Medtronic Model:  Intrathecal catheter Model No.: 8731  Serial No.: n/a  Implanted Length (cm): 28.9  Catheter Volume (mL): 0.209  Tip Location (Level): T11 (Left) Canal Access Site: L2-3  Drug content:  Primary Medication Class: Opioid  Medication: PF-Hydromorphone (Dilaudid)  Concentration: 8 mg/mL   Secondary Medication Class: Opioid  Medication: PF-Sufentanil  Concentration: 70 mcg/mL   Tertiary Medication Class: none  Medication: none   PA parameters (PCA-mode):  Mode: Off (Inactive)  Programming:  Type: Simple continuous.  Medication, Concentration, Infusion Program, & Delivery Rate: For up-to-date details please see most recent scanned programming printout.   Intrathecal Pump Therapy Assessment  Manufacturer: Medtronic Synchromed Type: Programmable Volume: 40 mL reservoir MRI compatibility: Yes    Drug content:  Primary Medication Class: Opioid Primary Medication: PF-Hydromorphone (Dilaudid) (8 mg/mL)  Secondary Medication: PF-Sufentanil (70 g/mL)  Other Medication: No third    Programming:  Type: Simple continuous. See pump readout for details.       Changes:  Medication Change: None at this point Rate Change: No change in rate  Reported side-effects or adverse reactions: None reported  Effectiveness: Described as relatively effective, allowing for increase in activities of daily living (ADL) Clinically meaningful improvement in function (CMIF): Sustained CMIF goals met  Plan: Pump refill today   Pharmacotherapy Assessment   Analgesic: No other oral opioid analgesics prescribed by our  practice. Intrathecal PF-Hydromorphone 3.9295 mg/day + PF-Sufentanil 34.383  mcg/day. MME: Aprox. 58.7 mg/day.   Monitoring: [place] PMP: PDMP reviewed during this encounter.       Pharmacotherapy: No side-effects or adverse reactions reported. Compliance: No problems identified. Effectiveness: Clinically acceptable. Plan: Refer to "POC". UDS:  Summary  Date Value Ref Range Status  07/30/2019 Note  Final    Comment:    ==================================================================== Compliance Drug Analysis, Ur ==================================================================== Test                             Result       Flag       Units Drug Present not Declared for Prescription Verification   Hydromorphone                  1757         UNEXPECTED ng/mg creat    Hydromorphone may be administered as a scheduled prescription    medication; it is also an expected metabolite of hydrocodone.   Naproxen                       PRESENT      UNEXPECTED Drug Absent but Declared for Prescription Verification   Salicylate                     Not Detected UNEXPECTED    Aspirin, as indicated in the declared medication list, is not always    detected even when used as directed. ==================================================================== Test                      Result    Flag   Units      Ref Range   Creatinine              182              mg/dL      >=20 ==================================================================== Declared Medications:  The flagging and interpretation on this report are based on the  following declared medications.  Unexpected results may arise from  inaccuracies in the declared medications.  **Note: The testing scope of this panel does not include small to  moderate amounts of these reported medications:  Aspirin  **Note: The testing scope of this panel does not include the  following reported medications:  Albuterol (Ventolin HFA)  Atorvastatin  (Lipitor)  Betamethasone (Lotrisone)  Carvedilol (Coreg)  Cholecalciferol  Clotrimazole (Lotrisone)  Hydrocortisone  Mirabegron (Myrbetriq)  Naloxegol (Movantik)  Oxybutynin (Ditropan)  Supplement ==================================================================== For clinical consultation, please call [phone]. ====================================================================      Pre-op H&P Assessment:  Ms. Buswell is a 75 y.o. (year old), female patient, seen today for interventional treatment. She  has a past surgical history that includes Carpal tunnel release (Bilateral); Back surgery; Breast surgery (Bilateral); Cholecystectomy; Colonoscopy w/ polypectomy; Cataract extraction w/ intraocular lens implant (Right); Abdominal hysterectomy; Fracture surgery (Right); Cardiac catheterization (2012); and Pain pump revision (Left, 07/07/2017). Ms. Rasor has a current medication list which includes the following prescription(s): atorvastatin, carvedilol, celecoxib, meloxicam, naloxegol oxalate, naloxone, NON FORMULARY, oxybutynin, ssd, and albuterol. Her primarily concern today is the Back Pain (Left )  Initial Vital Signs:  Pulse/HCG Rate: 77  Temp: (!) 96.3 F (35.7 C) Resp: 18 BP: 133/67 SpO2: 95 %  BMI: Estimated body mass index is 32.4 kg/m as calculated from the following:   Height as of this encounter: 5' 2.5" (1.588 m).   Weight as of this  encounter: 180 lb (81.6 kg).  Risk Assessment: Allergies: Reviewed. She is allergic to doxycycline hyclate and sulfa antibiotics.  Allergy Precautions: None required Coagulopathies: Reviewed. None identified.  Blood-thinner therapy: None at this time Active Infection(s): Reviewed. None identified. Ms. Enwright is afebrile  Site Confirmation: Ms. Hazell was asked to confirm the procedure and laterality before marking the site Procedure checklist: Completed Consent: Before the procedure and under the influence of no  sedative(s), amnesic(s), or anxiolytics, the patient was informed of the treatment options, risks and possible complications. To fulfill our ethical and legal obligations, as recommended by the American Medical Association's Code of Ethics, I have informed the patient of my clinical impression; the nature and purpose of the treatment or procedure; the risks, benefits, and possible complications of the intervention; the alternatives, including doing nothing; the risk(s) and benefit(s) of the alternative treatment(s) or procedure(s); and the risk(s) and benefit(s) of doing nothing.  Ms. Douglas was provided with information about the general risks and possible complications associated with most interventional procedures. These include, but are not limited to: failure to achieve desired goals, infection, bleeding, organ or nerve damage, allergic reactions, paralysis, and/or death.  In addition, she was informed of those risks and possible complications associated to this particular procedure, which include, but are not limited to: damage to the implant; failure to decrease pain; local, systemic, or serious CNS infections, intraspinal abscess with possible cord compression and paralysis, or life-threatening such as meningitis; bleeding; organ damage; nerve injury or damage with subsequent sensory, motor, and/or autonomic system dysfunction, resulting in transient or permanent pain, numbness, and/or weakness of one or several areas of the body; allergic reactions, either minor or major life-threatening, such as anaphylactic or anaphylactoid reactions.  Furthermore, Ms. Sedore was informed of those risks and complications associated with the medications. These include, but are not limited to: allergic reactions (i.e.: anaphylactic or anaphylactoid reactions); endorphine suppression; bradycardia and/or hypotension; water retention and/or peripheral vascular relaxation leading to lower extremity edema and possible  stasis ulcers; respiratory depression and/or shortness of breath; decreased metabolic rate leading to weight gain; swelling or edema; medication-induced neural toxicity; particulate matter embolism and blood vessel occlusion with resultant organ, and/or nervous system infarction; and/or intrathecal granuloma formation with possible spinal cord compression and permanent paralysis.  Before refilling the pump Ms. Cisek was informed that some of the medications used in the devise may not be FDA approved for such use and therefore it constitutes an off-label use of the medications.  Finally, she was informed that Medicine is not an exact science; therefore, there is also the possibility of unforeseen or unpredictable risks and/or possible complications that may result in a catastrophic outcome. The patient indicated having understood very clearly. We have given the patient no guarantees and we have made no promises. Enough time was given to the patient to ask questions, all of which were answered to the patient's satisfaction. Ms. Carrera has indicated that she wanted to continue with the procedure. Attestation: I, the ordering provider, attest that I have discussed with the patient the benefits, risks, side-effects, alternatives, likelihood of achieving goals, and potential problems during recovery for the procedure that I have provided informed consent. Date  Time: 04/15/2021 12:52 PM  Pre-Procedure Preparation:  Monitoring: As per clinic protocol. Respiration, ETCO2, SpO2, BP, heart rate and rhythm monitor placed and checked for adequate function Safety Precautions: Patient was assessed for positional comfort and pressure points before starting the procedure. Time-out: I initiated and conducted the "Time-out" before  starting the procedure, as per protocol. The patient was asked to participate by confirming the accuracy of the "Time Out" information. Verification of the correct person, site, and  procedure were performed and confirmed by me, the nursing staff, and the patient. "Time-out" conducted as per Joint Commission's Universal Protocol (UP.01.01.01). Time: 1318  Description of Procedure:          Position: Supine Target Area: Central-port of intrathecal pump. Approach: Anterior, 90 degree angle approach. Area Prepped: Entire Area around the pump implant. DuraPrep (Iodine Povacrylex [0.7% available iodine] and Isopropyl Alcohol, 74% w/w) Safety Precautions: Aspiration looking for blood return was conducted prior to all injections. At no point did we inject any substances, as a needle was being advanced. No attempts were made at seeking any paresthesias. Safe injection practices and needle disposal techniques used. Medications properly checked for expiration dates. SDV (single dose vial) medications used. Description of the Procedure: Protocol guidelines were followed. Two nurses trained to do implant refills were present during the entire procedure. The refill medication was checked by both healthcare providers as well as the patient. The patient was included in the "Time-out" to verify the medication. The patient was placed in position. The pump was identified. The area was prepped in the usual manner. The sterile template was positioned over the pump, making sure the side-port location matched that of the pump. Both, the pump and the template were held for stability. The needle provided in the Medtronic Kit was then introduced thru the center of the template and into the central port. The pump content was aspirated and discarded volume documented. The new medication was slowly infused into the pump, thru the filter, making sure to avoid overpressure of the device. The needle was then removed and the area cleansed, making sure to leave some of the prepping solution back to take advantage of its long term bactericidal properties. The pump was interrogated and programmed to reflect the correct  medication, volume, and dosage. The program was printed and taken to the physician for approval. Once checked and signed by the physician, a copy was provided to the patient and another scanned into the EMR.  Vitals:   04/15/21 1251 04/15/21 1253  BP:  133/67  Pulse:  77  Resp: 18   Temp:  (!) 96.3 F (35.7 C)  TempSrc:  Temporal  SpO2:  95%  Weight: 180 lb (81.6 kg) 180 lb (81.6 kg)  Height: 5' 2.5" (1.588 m) 5' 2.5" (1.588 m)    Start Time: 1318 hrs. End Time: 1331 hrs. Materials & Medications: Medtronic Refill Kit Medication(s): Please see chart orders for details.  Imaging Guidance:          Type of Imaging Technique: None used Indication(s): N/A Exposure Time: No patient exposure Contrast: None used. Fluoroscopic Guidance: N/A Ultrasound Guidance: N/A Interpretation: N/A  Antibiotic Prophylaxis:   Anti-infectives (From admission, onward)    None      Indication(s): None identified  Post-operative Assessment:  Post-procedure Vital Signs:  Pulse/HCG Rate: 77  Temp: (!) 96.3 F (35.7 C) Resp: 18 BP: 133/67 SpO2: 95 %  EBL: None  Complications: No immediate post-treatment complications observed by team, or reported by patient.  Note: The patient tolerated the entire procedure well. A repeat set of vitals were taken after the procedure and the patient was kept under observation following institutional policy, for this type of procedure. Post-procedural neurological assessment was performed, showing return to baseline, prior to discharge. The patient was provided with post-procedure  discharge instructions, including a section on how to identify potential problems. Should any problems arise concerning this procedure, the patient was given instructions to immediately contact us, at any time, without hesitation. In any case, we plan to contact the patient by telephone for a follow-up status report regarding this interventional procedure.  Comments:  No additional  relevant information.  Plan of Care  Orders:  Orders Placed This Encounter  Procedures   PUMP REFILL    Maintain Protocol by having two(2) healthcare providers during procedure and programming.    Scheduling Instructions:     Please refill intrathecal pump today.    Order Specific Question:   Where will this procedure be performed?    Answer:   [facility]   PUMP REFILL    Whenever possible schedule on a procedure today.    Standing Status:   Future    Standing Expiration Date:   08/14/2021    Scheduling Instructions:     Please schedule intrathecal pump refill based on pump programming. Avoid schedule intervals of more than 120 days (4 months).    Order Specific Question:   Where will this procedure be performed?    Answer:   [facility]   Informed Consent Details: Physician/Practitioner Attestation; Transcribe to consent form and obtain patient signature    Transcribe to consent form and obtain patient signature.    Order Specific Question:   Physician/Practitioner attestation of informed consent for procedure/surgical case    Answer:   I, the physician/practitioner, attest that I have discussed with the patient the benefits, risks, side effects, alternatives, likelihood of achieving goals and potential problems during recovery for the procedure that I have provided informed consent.    Order Specific Question:   Procedure    Answer:   Intrathecal pump refill    Order Specific Question:   Physician/Practitioner performing the procedure    Answer:   Attending Physician: Kathlen Brunswick. Dossie Arbour, MD & designated trained staff    Order Specific Question:   Indication/Reason    Answer:   Chronic Pain Syndrome (G89.4), presence of an intrathecal pump (Z97.8)    Chronic Opioid Analgesic:  No other oral opioid analgesics prescribed by our practice. Intrathecal PF-Hydromorphone 3.9295 mg/day + PF-Sufentanil 34.383 mcg/day. MME: Aprox. 58.7 mg/day.   Medications ordered for  procedure: No orders of the defined types were placed in this encounter.  Medications administered: Tiffany Hunter had no medications administered during this visit.  See the medical record for exact dosing, route, and time of administration.  Follow-up plan:   Return for Pump Refill (Max:30mo).      Interventional Therapies  Risk  Complexity Considerations:   Estimated body mass index is 32.4 kg/m as calculated from the following:   Height as of this encounter: 5' 2.5" (1.588 m).   Weight as of this encounter: 180 lb (81.6 kg). Presence of intrathecal pump  DM  CAD  Pulmonary hypertension    Planned  Pending:   Palliative intrathecal pump refills    Under consideration:   Diagnostic left lumbar facet MBB #3  Possible left lumbar facet RFA #1   Completed:   Diagnostic left lumbar facet MBB x2 (12/22/2016) (07/14/2020) (100/100/100/100)   Therapeutic  Palliative (PRN) options:   Therapeutic/palliative left sided lumbar facet MBB #3      Recent Visits Date Type Provider Dept  02/02/21 Procedure visit Milinda Pointer, MD [facility]  Showing recent visits within past 90 days and meeting all other requirements  Today's Visits Date Type Provider Dept  04/15/21 Procedure visit Milinda Pointer, MD [facility]  Showing today's visits and meeting all other requirements Future Appointments Date Type Provider Dept  06/17/21 Appointment Milinda Pointer, MD [facility]  Showing future appointments within next 90 days and meeting all other requirements Disposition: Discharge home  Discharge (Date  Time): 04/15/2021;   hrs.   Primary Care Physician: Iva Lento, PA-C Location: [facility] Note by: Gaspar Cola, MD Date: 04/15/2021; Time: 3:14 PM  Disclaimer:  Medicine is not an Chief Strategy Officer. The only guarantee in medicine is that nothing is guaranteed. It is important to note that the decision to  proceed with this intervention was based on the information collected from the patient. The Data and conclusions were drawn from the patient's questionnaire, the interview, and the physical examination. Because the information was provided in large part by the patient, it cannot be guaranteed that it has not been purposely or unconsciously manipulated. Every effort has been made to obtain as much relevant data as possible for this evaluation. It is important to note that the conclusions that lead to this procedure are derived in large part from the available data. Always take into account that the treatment will also be dependent on availability of resources and existing treatment guidelines, considered by other Pain Management Practitioners as being common knowledge and practice, at the time of the intervention. For Medico-Legal purposes, it is also important to point out that variation in procedural techniques and pharmacological choices are the acceptable norm. The indications, contraindications, technique, and results of the above procedure should only be interpreted and judged by a Board-Certified Interventional Pain Specialist with extensive familiarity and expertise in the same exact procedure and technique.

## 2021-04-15 NOTE — Patient Instructions (Signed)
Opioid Overdose Opioids are drugs that are often used to treat pain. Opioids include illegal drugs, such as heroin, as well as prescription pain medicines, such as codeine, morphine, hydrocodone, and fentanyl. An opioid overdose happens when you take too much of an opioid. An overdose may be intentional or accidental and can happen with any type of opioid. The effects of an overdose can be mild, dangerous, or even deadly. Opioid overdose is a medical emergency. What are the causes? This condition may be caused by: Taking too much of an opioid on purpose. Taking too much of an opioid by accident. Using two or more substances that contain opioids at the same time. Taking an opioid with a substance that affects your heart, breathing, or blood pressure. These include alcohol, tranquilizers, sleeping pills, illegal drugs, and some over-the-counter medicines. This condition may also happen due to an error made by: A health care provider who prescribes a medicine. The pharmacist who fills the prescription. What increases the risk? This condition is more likely in: Children. They may be attracted to colorful pills. Because of a child's small size, even a small amount of a medicine can be dangerous. Older people. They may be taking many different medicines. Older people may have difficulty reading labels or remembering when they last took their medicines. They may also be more sensitive to the effects of opioids. People with chronic medical conditions, especially heart, liver, kidney, or neurological diseases. People who take an opioid for a long period of time. People who take opioids and use illegal drugs, such as heroin, or other substances, such as alcohol. People who: Have a history of drug or alcohol abuse. Have certain mental health conditions. Have a history of previous drug overdoses. People who take opioids that are not prescribed for them. What are the signs or symptoms? Symptoms of this  condition depend on the type of opioid and the amount that was taken. Common symptoms include: Sleepiness or difficulty waking from sleep. Confusion. Slurred speech. Slowed breathing and a slow pulse (bradycardia). Nausea and vomiting. Abnormally small pupils. Signs and symptoms that require emergency treatment include: Cold, clammy, and pale skin. Blue lips and fingernails. Vomiting. Gurgling sounds in the throat. A pulse that is very slow or difficult to detect. Breathing that is very irregular, slow, noisy, or difficult to detect. Inability to respond to speech or be awakened from sleep (stupor). Seizures. How is this diagnosed? This condition is diagnosed based on your symptoms and medical history. It is important to tell your health care provider: About all of the opioids that you took. When you took the opioids. Whether you were drinking alcohol or using marijuana, cocaine, or other drugs. Your health care provider will do a physical exam. This exam may include: Checking and monitoring your heart rate and rhythm, breathing rate, temperature, and blood pressure. Measuring oxygen levels in your blood. Checking for abnormally small pupils. You may also have blood tests or urine tests. You may have X-rays if you are having severe breathing problems. How is this treated? This condition requires immediate medical treatment and hospitalization. Reversing the effects of the opioid is the first step in treatment. If you have a Narcan kit or naloxone, use it right away. Follow your health care provider's instructions. A friend or family member can also help you with this. The rest of your treatment will be given in the hospital intensive care (ICU). Treatment in the hospital may include: Giving salts and minerals (electrolytes) along with fluids through   an IV. ?Inserting a breathing tube (endotracheal tube) in your airway to help you breathe if you cannot breathe on your own or you are in  danger of not being able to breathe on your own. ?Giving oxygen through a small tube under your nose. ?Passing a tube through your nose and into your stomach (nasogastric tube, or NG tube) to empty your stomach. ?Giving medicines that: ?Increase your blood pressure. ?Relieve nausea and vomiting. ?Relieve abdominal pain and cramping. ?Reverse the effects of the opioid (naloxone). ?Monitoring your heart and oxygen levels. ?Ongoing counseling and mental health support if you intentionally overdosed or used an illegal drug. ?Follow these instructions at home: ?Medicines ?Take over-the-counter and prescription medicines only as told by your health care provider. ?Always ask your health care provider about possible side effects and interactions of any new medicine that you start taking. ?Keep a list of all the medicines that you take, including over-the-counter medicines. Bring this list with you to all your medical visits. ?General instructions ?Drink enough fluid to keep your urine pale yellow. ?Keep all follow-up visits. This is important. ?How is this prevented? ?Read the drug inserts that come with your opioid pain medicines. ?Take medicines only as told by your health care provider. Do not take more medicine than you are told. Do not take medicines more frequently than you are told. ?Do not drink alcohol or take sedatives when taking opioids. ?Do not use illegal or recreational drugs, including cocaine, ecstasy, and marijuana. ?Do not take opioid medicines that are not prescribed for you. ?Store all medicines in safety containers that are out of the reach of children. ?Get help if you are struggling with: ?Alcohol or drug use. ?Depression or another mental health problem. ?Thoughts of hurting yourself or another person. ?Keep the phone number of your local poison control center near your phone or in your mobile phone. In the [place] the hotline of the National Poison Control Center is (800) 222-1222. ?If you were  prescribed naloxone, make sure you understand how to take it. ?Contact a health care provider if: ?You need help understanding how to take your pain medicines. ?You feel your medicines are too strong. ?You are concerned that your pain medicines are not working well for your pain. ?You develop new symptoms or side effects when you are taking medicines. ?Get help right away if: ?You or someone else is having symptoms of an opioid overdose. Get help even if you are not sure. ?You have thoughts about hurting yourself or others. ?You have: ?Chest pain. ?Difficulty breathing. ?A loss of consciousness. ?These symptoms may represent a serious problem that is an emergency. Do not wait to see if the symptoms will go away. Get medical help right away. Call your local emergency services (911 in the [place] Do not drive yourself to the hospital. ?If you ever feel like you may hurt yourself or others, or have thoughts about taking your own life, get help right away. You can go to your nearest emergency department or: ?Call your local emergency services (911 in the [place] ?Call a suicide crisis helpline, such as the National Suicide Prevention Lifeline at 1-800-273-8255 or 988 in the [place] This is open 24 hours a day in the [place] ?Text the Crisis Text Line at 741741 (in the [place] ?Summary ?Opioids are drugs that are often used to treat pain. Opioids include illegal drugs, such as heroin, as well as prescription pain medicines. ?An opioid overdose happens when you take too much of an opioid. ?  Overdoses can be intentional or accidental. ?Opioid overdose is very dangerous. It is a life-threatening emergency. ?If you or someone you know is experiencing an opioid overdose, get help right away. ?This information is not intended to replace advice given to you by your health care provider. Make sure you discuss any questions you have with your health care provider. ?Document Revised: 11/25/2020 Document Reviewed: 08/12/2020 ?[employer]  Patient Education ? 2022 [employer] ? ?

## 2021-04-16 ENCOUNTER — Telehealth: Payer: Self-pay

## 2021-04-16 NOTE — Telephone Encounter (Signed)
Called post IT refill. Patient asleep. Husband will give her message to call if needed.

## 2021-04-23 MED FILL — Medication: INTRATHECAL | Qty: 1 | Status: AC

## 2021-05-25 ENCOUNTER — Other Ambulatory Visit: Payer: Self-pay

## 2021-05-25 MED ORDER — PAIN MANAGEMENT IT PUMP REFILL: 1.0000 | Freq: Once | INTRATHECAL | 0 refills | Status: AC

## 2021-06-17 ENCOUNTER — Other Ambulatory Visit: Payer: Self-pay

## 2021-06-17 ENCOUNTER — Encounter: Payer: Self-pay | Admitting: Pain Medicine

## 2021-06-17 ENCOUNTER — Ambulatory Visit: Payer: Medicare Other | Attending: Pain Medicine | Admitting: Pain Medicine

## 2021-06-17 VITALS — BP 140/82 | HR 70 | Temp 97.5°F | Resp 16 | Ht 62.5 in | Wt 180.0 lb

## 2021-06-17 DIAGNOSIS — M47816 Spondylosis without myelopathy or radiculopathy, lumbar region: Secondary | ICD-10-CM | POA: Insufficient documentation

## 2021-06-17 DIAGNOSIS — Z451 Encounter for adjustment and management of infusion pump: Secondary | ICD-10-CM | POA: Diagnosis not present

## 2021-06-17 DIAGNOSIS — G894 Chronic pain syndrome: Secondary | ICD-10-CM | POA: Diagnosis present

## 2021-06-17 DIAGNOSIS — Z978 Presence of other specified devices: Secondary | ICD-10-CM | POA: Insufficient documentation

## 2021-06-17 DIAGNOSIS — Z79891 Long term (current) use of opiate analgesic: Secondary | ICD-10-CM | POA: Diagnosis not present

## 2021-06-17 DIAGNOSIS — M79605 Pain in left leg: Secondary | ICD-10-CM | POA: Insufficient documentation

## 2021-06-17 DIAGNOSIS — M5442 Lumbago with sciatica, left side: Secondary | ICD-10-CM | POA: Diagnosis not present

## 2021-06-17 DIAGNOSIS — M961 Postlaminectomy syndrome, not elsewhere classified: Secondary | ICD-10-CM | POA: Insufficient documentation

## 2021-06-17 DIAGNOSIS — T402X5A Adverse effect of other opioids, initial encounter: Secondary | ICD-10-CM | POA: Insufficient documentation

## 2021-06-17 DIAGNOSIS — G8929 Other chronic pain: Secondary | ICD-10-CM

## 2021-06-17 DIAGNOSIS — K5903 Drug induced constipation: Secondary | ICD-10-CM | POA: Insufficient documentation

## 2021-06-17 DIAGNOSIS — M5137 Other intervertebral disc degeneration, lumbosacral region: Secondary | ICD-10-CM | POA: Insufficient documentation

## 2021-06-17 DIAGNOSIS — Z79899 Other long term (current) drug therapy: Secondary | ICD-10-CM | POA: Diagnosis not present

## 2021-06-17 NOTE — Progress Notes (Signed)
PROVIDER NOTE: Interpretation of information contained herein should be left to medically-trained personnel. Specific patient instructions are provided elsewhere under "Patient Instructions" section of medical record. This document was created in part using STT-dictation technology, any transcriptional errors that may result from this process are unintentional.  Patient: Tiffany Hunter Type: Established DOB: 05-23-1945 MRN: [id number] PCP: Iva Lento, PA-C  Service: Procedure DOS: 06/17/2021 Setting: Ambulatory Location: Ambulatory outpatient facility Delivery: Face-to-face Provider: Gaspar Cola, MD Specialty: Interventional Pain Management Specialty designation: 09 Location: Outpatient facility Ref. Prov.: Iva Lento, PA-C    Primary Reason for Visit: Interventional Pain Management Treatment. CC: Back Pain  Procedure:          Type: Management of Intrathecal Drug Delivery System (IDDS) - Reservoir Refill [phone]). No rate change.  Indications: 1. Chronic pain syndrome   2. 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 Lumbar facet joint syndrome (Left)   6. DDD (degenerative disc disease), lumbosacral   7. Pharmacologic therapy   8. 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 Encounter for adjustment or management of infusion pump   10. Chronic use of opiate for therapeutic purpose   11. Opioid-induced constipation (OIC)    Pain Assessment: Self-Reported Pain Score: 8 /10             Reported level is compatible with observation.         Intrathecal Drug Delivery System (IDDS)  Pump Device:  Manufacturer: Medtronic Model: Synchromed II Model No.: [id number] Serial No.: [id number] H Delivery Route: Intrathecal Type: Programmable  Volume (mL): 40 mL reservoir Priming Volume: N/A  Calibration  Constant: 114.0  MRI compatibility: Conditional   Implant Details:  Date: 07/07/2017 Implanter: Roderic Palau, MD Contact Information: [facility] [phone] Last Revision/Replacement: 07/07/2017 Estimated Replacement Date: Nov/2025  Implant Site: Abdominal Laterality: Left  Catheter: Manufacturer: Medtronic Model:  Intrathecal catheter Model No.: 8731  Serial No.: n/a  Implanted Length (cm): 28.9  Catheter Volume (mL): 0.209  Tip Location (Level): T11 (Left) Canal Access Site: L2-3  Drug content:  Primary Medication Class: Opioid  Medication: PF-Hydromorphone (Dilaudid)  Concentration: 8 mg/mL   Secondary Medication Class: Opioid  Medication: PF-Sufentanil  Concentration: 70 mcg/mL   Tertiary Medication Class: none  Medication: none   PA parameters (PCA-mode):  Mode: Off (Inactive)  Programming:  Type: Simple continuous.  Medication, Concentration, Infusion Program, & Delivery Rate: For up-to-date details please see most recent scanned programming printout.   Intrathecal Pump Therapy Assessment  Manufacturer: Medtronic Synchromed Type: Programmable Volume: 40 mL reservoir MRI compatibility: Yes    Drug content:  Primary Medication Class: Opioid Primary Medication: PF-Hydromorphone (Dilaudid) (8 mg/mL)  Secondary Medication: PF-Sufentanil (70 g/mL)  Other Medication: No third    Programming:  Type: Simple continuous. See pump readout for details.       Changes:  Medication Change: None at this point Rate Change: No change in rate  Reported side-effects or adverse reactions: None reported  Effectiveness: Described as relatively effective, allowing for increase in activities of daily living (ADL) Clinically meaningful improvement in function (CMIF): Sustained CMIF goals met  Plan: Pump refill today   Pharmacotherapy Assessment   Opioid Analgesic: No other oral opioid analgesics prescribed by our practice. Intrathecal PF-Hydromorphone  3.9295 mg/day + PF-Sufentanil 34.383 mcg/day. MME: Aprox. 58.7 mg/day.   Monitoring:  [place] PMP: PDMP not reviewed this encounter.       Pharmacotherapy: No side-effects or adverse reactions reported. Compliance: No problems identified. Effectiveness: Clinically acceptable. Plan: Refer to "POC". UDS:  Summary  Date Value Ref Range Status  07/30/2019 Note  Final    Comment:    ==================================================================== Compliance Drug Analysis, Ur ==================================================================== Test                             Result       Flag       Units Drug Present not Declared for Prescription Verification   Hydromorphone                  1757         UNEXPECTED ng/mg creat    Hydromorphone may be administered as a scheduled prescription    medication; it is also an expected metabolite of hydrocodone.   Naproxen                       PRESENT      UNEXPECTED Drug Absent but Declared for Prescription Verification   Salicylate                     Not Detected UNEXPECTED    Aspirin, as indicated in the declared medication list, is not always    detected even when used as directed. ==================================================================== Test                      Result    Flag   Units      Ref Range   Creatinine              182              mg/dL      >=20 ==================================================================== Declared Medications:  The flagging and interpretation on this report are based on the  following declared medications.  Unexpected results may arise from  inaccuracies in the declared medications.  **Note: The testing scope of this panel does not include small to  moderate amounts of these reported medications:  Aspirin  **Note: The testing scope of this panel does not include the  following reported medications:  Albuterol (Ventolin HFA)  Atorvastatin (Lipitor)  Betamethasone (Lotrisone)   Carvedilol (Coreg)  Cholecalciferol  Clotrimazole (Lotrisone)  Hydrocortisone  Mirabegron (Myrbetriq)  Naloxegol (Movantik)  Oxybutynin (Ditropan)  Supplement ==================================================================== For clinical consultation, please call [phone]. ====================================================================      Pre-op H&P Assessment:  Ms. Escalona is a 76 y.o. (year old), female patient, seen today for interventional treatment. She  has a past surgical history that includes Carpal tunnel release (Bilateral); Back surgery; Breast surgery (Bilateral); Cholecystectomy; Colonoscopy w/ polypectomy; Cataract extraction w/ intraocular lens implant (Right); Abdominal hysterectomy; Fracture surgery (Right); Cardiac catheterization (2012); and Pain pump revision (Left, 07/07/2017). Ms. Dooly has a current medication list which includes the following prescription(s): atorvastatin, carvedilol, celecoxib, meloxicam, naloxegol oxalate, naloxone, NON FORMULARY, oxybutynin, ssd, and albuterol. Her primarily concern today is the Back Pain  Initial Vital Signs:  Pulse/HCG Rate: 70  Temp: (!) 97.5 F (36.4 C) Resp: 16 BP: 140/82 SpO2: 98 %  BMI: Estimated body mass index is 32.4 kg/m as calculated from the following:   Height as of this encounter: 5' 2.5" (1.588 m).   Weight as of this encounter: 180 lb (81.6 kg).  Risk Assessment: Allergies: Reviewed.  She is allergic to doxycycline hyclate and sulfa antibiotics.  Allergy Precautions: None required Coagulopathies: Reviewed. None identified.  Blood-thinner therapy: None at this time Active Infection(s): Reviewed. None identified. Ms. Wenner is afebrile  Site Confirmation: Ms. Tigue was asked to confirm the procedure and laterality before marking the site Procedure checklist: Completed Consent: Before the procedure and under the influence of no sedative(s), amnesic(s), or anxiolytics, the patient  was informed of the treatment options, risks and possible complications. To fulfill our ethical and legal obligations, as recommended by the American Medical Association's Code of Ethics, I have informed the patient of my clinical impression; the nature and purpose of the treatment or procedure; the risks, benefits, and possible complications of the intervention; the alternatives, including doing nothing; the risk(s) and benefit(s) of the alternative treatment(s) or procedure(s); and the risk(s) and benefit(s) of doing nothing.  Ms. Sessa was provided with information about the general risks and possible complications associated with most interventional procedures. These include, but are not limited to: failure to achieve desired goals, infection, bleeding, organ or nerve damage, allergic reactions, paralysis, and/or death.  In addition, she was informed of those risks and possible complications associated to this particular procedure, which include, but are not limited to: damage to the implant; failure to decrease pain; local, systemic, or serious CNS infections, intraspinal abscess with possible cord compression and paralysis, or life-threatening such as meningitis; bleeding; organ damage; nerve injury or damage with subsequent sensory, motor, and/or autonomic system dysfunction, resulting in transient or permanent pain, numbness, and/or weakness of one or several areas of the body; allergic reactions, either minor or major life-threatening, such as anaphylactic or anaphylactoid reactions.  Furthermore, Ms. Fleener was informed of those risks and complications associated with the medications. These include, but are not limited to: allergic reactions (i.e.: anaphylactic or anaphylactoid reactions); endorphine suppression; bradycardia and/or hypotension; water retention and/or peripheral vascular relaxation leading to lower extremity edema and possible stasis ulcers; respiratory depression and/or  shortness of breath; decreased metabolic rate leading to weight gain; swelling or edema; medication-induced neural toxicity; particulate matter embolism and blood vessel occlusion with resultant organ, and/or nervous system infarction; and/or intrathecal granuloma formation with possible spinal cord compression and permanent paralysis.  Before refilling the pump Ms. Lindamood was informed that some of the medications used in the devise may not be FDA approved for such use and therefore it constitutes an off-label use of the medications.  Finally, she was informed that Medicine is not an exact science; therefore, there is also the possibility of unforeseen or unpredictable risks and/or possible complications that may result in a catastrophic outcome. The patient indicated having understood very clearly. We have given the patient no guarantees and we have made no promises. Enough time was given to the patient to ask questions, all of which were answered to the patient's satisfaction. Ms. Garman has indicated that she wanted to continue with the procedure. Attestation: I, the ordering provider, attest that I have discussed with the patient the benefits, risks, side-effects, alternatives, likelihood of achieving goals, and potential problems during recovery for the procedure that I have provided informed consent. Date   Time: 06/17/2021  1:08 PM  Pre-Procedure Preparation:  Monitoring: As per clinic protocol. Respiration, ETCO2, SpO2, BP, heart rate and rhythm monitor placed and checked for adequate function Safety Precautions: Patient was assessed for positional comfort and pressure points before starting the procedure. Time-out: I initiated and conducted the "Time-out" before starting the procedure, as per protocol. The patient  was asked to participate by confirming the accuracy of the "Time Out" information. Verification of the correct person, site, and procedure were performed and confirmed by me, the  nursing staff, and the patient. "Time-out" conducted as per Joint Commission's Universal Protocol (UP.01.01.01). Time: 1321  Description of Procedure:          Position: Supine Target Area: Central-port of intrathecal pump. Approach: Anterior, 90 degree angle approach. Area Prepped: Entire Area around the pump implant. DuraPrep (Iodine Povacrylex [0.7% available iodine] and Isopropyl Alcohol, 74% w/w) Safety Precautions: Aspiration looking for blood return was conducted prior to all injections. At no point did we inject any substances, as a needle was being advanced. No attempts were made at seeking any paresthesias. Safe injection practices and needle disposal techniques used. Medications properly checked for expiration dates. SDV (single dose vial) medications used. Description of the Procedure: Protocol guidelines were followed. Two nurses trained to do implant refills were present during the entire procedure. The refill medication was checked by both healthcare providers as well as the patient. The patient was included in the "Time-out" to verify the medication. The patient was placed in position. The pump was identified. The area was prepped in the usual manner. The sterile template was positioned over the pump, making sure the side-port location matched that of the pump. Both, the pump and the template were held for stability. The needle provided in the Medtronic Kit was then introduced thru the center of the template and into the central port. The pump content was aspirated and discarded volume documented. The new medication was slowly infused into the pump, thru the filter, making sure to avoid overpressure of the device. The needle was then removed and the area cleansed, making sure to leave some of the prepping solution back to take advantage of its long term bactericidal properties. The pump was interrogated and programmed to reflect the correct medication, volume, and dosage. The program was  printed and taken to the physician for approval. Once checked and signed by the physician, a copy was provided to the patient and another scanned into the EMR.  Vitals:   06/17/21 1307  BP: 140/82  Pulse: 70  Resp: 16  Temp: (!) 97.5 F (36.4 C)  SpO2: 98%  Weight: 180 lb (81.6 kg)  Height: 5' 2.5" (1.588 m)    Start Time: 1325 hrs. End Time: 1336 hrs. Materials & Medications: Medtronic Refill Kit Medication(s): Please see chart orders for details.  Imaging Guidance:          Type of Imaging Technique: None used Indication(s): N/A Exposure Time: No patient exposure Contrast: None used. Fluoroscopic Guidance: N/A Ultrasound Guidance: N/A Interpretation: N/A  Antibiotic Prophylaxis:   Anti-infectives (From admission, onward)    None      Indication(s): None identified  Post-operative Assessment:  Post-procedure Vital Signs:  Pulse/HCG Rate: 70  Temp: (!) 97.5 F (36.4 C) Resp: 16 BP: 140/82 SpO2: 98 %  EBL: None  Complications: No immediate post-treatment complications observed by team, or reported by patient.  Note: The patient tolerated the entire procedure well. A repeat set of vitals were taken after the procedure and the patient was kept under observation following institutional policy, for this type of procedure. Post-procedural neurological assessment was performed, showing return to baseline, prior to discharge. The patient was provided with post-procedure discharge instructions, including a section on how to identify potential problems. Should any problems arise concerning this procedure, the patient was given instructions to immediately contact us,  at any time, without hesitation. In any case, we plan to contact the patient by telephone for a follow-up status report regarding this interventional procedure.  Comments:  No additional relevant information.  Plan of Care  Orders:  Orders Placed This Encounter  Procedures   PUMP REFILL    Maintain  Protocol by having two(2) healthcare providers during procedure and programming.    Scheduling Instructions:     Please refill intrathecal pump today.    Order Specific Question:   Where will this procedure be performed?    Answer:   [facility]   PUMP REFILL    Whenever possible schedule on a procedure today.    Standing Status:   Future    Standing Expiration Date:   10/15/2021    Scheduling Instructions:     Please schedule intrathecal pump refill based on pump programming. Avoid schedule intervals of more than 120 days (4 months).    Order Specific Question:   Where will this procedure be performed?    Answer:   [facility]   Informed Consent Details: Physician/Practitioner Attestation; Transcribe to consent form and obtain patient signature    Transcribe to consent form and obtain patient signature.    Order Specific Question:   Physician/Practitioner attestation of informed consent for procedure/surgical case    Answer:   I, the physician/practitioner, attest that I have discussed with the patient the benefits, risks, side effects, alternatives, likelihood of achieving goals and potential problems during recovery for the procedure that I have provided informed consent.    Order Specific Question:   Procedure    Answer:   Intrathecal pump refill    Order Specific Question:   Physician/Practitioner performing the procedure    Answer:   Attending Physician: Kathlen Brunswick. Dossie Arbour, MD & designated trained staff    Order Specific Question:   Indication/Reason    Answer:   Chronic Pain Syndrome (G89.4), presence of an intrathecal pump (Z97.8)   Chronic Opioid Analgesic:  No other oral opioid analgesics prescribed by our practice. Intrathecal PF-Hydromorphone 3.9295 mg/day + PF-Sufentanil 34.383 mcg/day. MME: Aprox. 58.7 mg/day.   Medications ordered for procedure: No orders of the defined types were placed in this encounter.  Medications administered: [name] had  no medications administered during this visit.  See the medical record for exact dosing, route, and time of administration.  Follow-up plan:   Return for Pump Refill (Max:39mo.       Interventional Therapies  Risk   Complexity Considerations:   Estimated body mass index is 32.4 kg/m as calculated from the following:   Height as of this encounter: 5' 2.5" (1.588 m).   Weight as of this encounter: 180 lb (81.6 kg). Presence of intrathecal pump  DM  CAD  Pulmonary hypertension    Planned   Pending:   Palliative intrathecal pump refills    Under consideration:   Diagnostic left lumbar facet MBB #3  Possible left lumbar facet RFA #1   Completed:   Diagnostic left lumbar facet MBB x2 (12/22/2016) (07/14/2020) (100/100/100/100)   Therapeutic   Palliative (PRN) options:   Therapeutic/palliative left sided lumbar facet MBB #3       Recent Visits Date Type Provider Dept  04/15/21 Procedure visit NMilinda Pointer MD [facility]  Showing recent visits within past 90 days and meeting all other requirements Today's Visits Date Type Provider Dept  06/17/21 Procedure visit NMilinda Pointer MD [facility]  Showing today's visits and meeting all  other requirements Future Appointments Date Type Provider Dept  08/17/21 Appointment Milinda Pointer, MD [facility]  Showing future appointments within next 90 days and meeting all other requirements  Disposition: Discharge home  Discharge (Date   Time): 06/17/2021; 1350 hrs.   Primary Care Physician: Iva Lento, PA-C Location: [facility] Note by: Gaspar Cola, MD Date: 06/17/2021; Time: 1:54 PM  Disclaimer:  Medicine is not an Chief Strategy Officer. The only guarantee in medicine is that nothing is guaranteed. It is important to note that the decision to proceed with this intervention was based on the information collected from the patient. The Data and conclusions were  drawn from the patient's questionnaire, the interview, and the physical examination. Because the information was provided in large part by the patient, it cannot be guaranteed that it has not been purposely or unconsciously manipulated. Every effort has been made to obtain as much relevant data as possible for this evaluation. It is important to note that the conclusions that lead to this procedure are derived in large part from the available data. Always take into account that the treatment will also be dependent on availability of resources and existing treatment guidelines, considered by other Pain Management Practitioners as being common knowledge and practice, at the time of the intervention. For Medico-Legal purposes, it is also important to point out that variation in procedural techniques and pharmacological choices are the acceptable norm. The indications, contraindications, technique, and results of the above procedure should only be interpreted and judged by a Board-Certified Interventional Pain Specialist with extensive familiarity and expertise in the same exact procedure and technique.

## 2021-06-17 NOTE — Patient Instructions (Signed)
Patient has Narcan at home and husband knows how to use it.   Opioid Overdose Opioids are drugs that are often used to treat pain. Opioids include illegal drugs, such as heroin, as well as prescription pain medicines, such as codeine, morphine, hydrocodone, and fentanyl. An opioid overdose happens when you take too much of an opioid. An overdose may be intentional or accidental and can happen with any type of opioid. The effects of an overdose can be mild, dangerous, or even deadly. Opioid overdose is a medical emergency. What are the causes? This condition may be caused by: Taking too much of an opioid on purpose. Taking too much of an opioid by accident. Using two or more substances that contain opioids at the same time. Taking an opioid with a substance that affects your heart, breathing, or blood pressure. These include alcohol, tranquilizers, sleeping pills, illegal drugs, and some over-the-counter medicines. This condition may also happen due to an error made by: A health care provider who prescribes a medicine. The pharmacist who fills the prescription. What increases the risk? This condition is more likely in: Children. They may be attracted to colorful pills. Because of a child's small size, even a small amount of a medicine can be dangerous. Older people. They may be taking many different medicines. Older people may have difficulty reading labels or remembering when they last took their medicines. They may also be more sensitive to the effects of opioids. People with chronic medical conditions, especially heart, liver, kidney, or neurological diseases. People who take an opioid for a long period of time. People who take opioids and use illegal drugs, such as heroin, or other substances, such as alcohol. People who: Have a history of drug or alcohol abuse. Have certain mental health conditions. Have a history of previous drug overdoses. People who take opioids that are not  prescribed for them. What are the signs or symptoms? Symptoms of this condition depend on the type of opioid and the amount that was taken. Common symptoms include: Sleepiness or difficulty waking from sleep. Confusion. Slurred speech. Slowed breathing and a slow pulse (bradycardia). Nausea and vomiting. Abnormally small pupils. Signs and symptoms that require emergency treatment include: Cold, clammy, and pale skin. Blue lips and fingernails. Vomiting. Gurgling sounds in the throat. A pulse that is very slow or difficult to detect. Breathing that is very irregular, slow, noisy, or difficult to detect. Inability to respond to speech or be awakened from sleep (stupor). Seizures. How is this diagnosed? This condition is diagnosed based on your symptoms and medical history. It is important to tell your health care provider: About all of the opioids that you took. When you took the opioids. Whether you were drinking alcohol or using marijuana, cocaine, or other drugs. Your health care provider will do a physical exam. This exam may include: Checking and monitoring your heart rate and rhythm, breathing rate, temperature, and blood pressure. Measuring oxygen levels in your blood. Checking for abnormally small pupils. You may also have blood tests or urine tests. You may have X-rays if you are having severe breathing problems. How is this treated? This condition requires immediate medical treatment and hospitalization. Reversing the effects of the opioid is the first step in treatment. If you have a Narcan kit or naloxone, use it right away. Follow your health care provider's instructions. A friend or family member can also help you with this. The rest of your treatment will be given in the hospital intensive care (ICU). Treatment  in the hospital may include: Giving salts and minerals (electrolytes) along with fluids through an IV. Inserting a breathing tube (endotracheal tube) in your  airway to help you breathe if you cannot breathe on your own or you are in danger of not being able to breathe on your own. Giving oxygen through a small tube under your nose. Passing a tube through your nose and into your stomach (nasogastric tube, or NG tube) to empty your stomach. Giving medicines that: Increase your blood pressure. Relieve nausea and vomiting. Relieve abdominal pain and cramping. Reverse the effects of the opioid (naloxone). Monitoring your heart and oxygen levels. Ongoing counseling and mental health support if you intentionally overdosed or used an illegal drug. Follow these instructions at home: Medicines Take over-the-counter and prescription medicines only as told by your health care provider. Always ask your health care provider about possible side effects and interactions of any new medicine that you start taking. Keep a list of all the medicines that you take, including over-the-counter medicines. Bring this list with you to all your medical visits. General instructions Drink enough fluid to keep your urine pale yellow. Keep all follow-up visits. This is important. How is this prevented? Read the drug inserts that come with your opioid pain medicines. Take medicines only as told by your health care provider. Do not take more medicine than you are told. Do not take medicines more frequently than you are told. Do not drink alcohol or take sedatives when taking opioids. Do not use illegal or recreational drugs, including cocaine, ecstasy, and marijuana. Do not take opioid medicines that are not prescribed for you. Store all medicines in safety containers that are out of the reach of children. Get help if you are struggling with: Alcohol or drug use. Depression or another mental health problem. Thoughts of hurting yourself or another person. Keep the phone number of your local poison control center near your phone or in your mobile phone. In the [place] the hotline  of the [facility] is 360-511-1559. If you were prescribed naloxone, make sure you understand how to take it. Contact a health care provider if: You need help understanding how to take your pain medicines. You feel your medicines are too strong. You are concerned that your pain medicines are not working well for your pain. You develop new symptoms or side effects when you are taking medicines. Get help right away if: You or someone else is having symptoms of an opioid overdose. Get help even if you are not sure. You have thoughts about hurting yourself or others. You have: Chest pain. Difficulty breathing. A loss of consciousness. These symptoms may represent a serious problem that is an emergency. Do not wait to see if the symptoms will go away. Get medical help right away. Call your local emergency services (911 in the [place] Do not drive yourself to the hospital. If you ever feel like you may hurt yourself or others, or have thoughts about taking your own life, get help right away. You can go to your nearest emergency department or: Call your local emergency services (911 in the [place] Call a suicide crisis helpline, such as the [place] at (579)773-0731 or 988 in the [place]. This is open 24 hours a day in the [place] Text the Crisis Text Line at 312-502-2581 (in the [place].). Summary Opioids are drugs that are often used to treat pain. Opioids include illegal drugs, such as heroin, as well as prescription  pain medicines. An opioid overdose happens when you take too much of an opioid. Overdoses can be intentional or accidental. Opioid overdose is very dangerous. It is a life-threatening emergency. If you or someone you know is experiencing an opioid overdose, get help right away. This information is not intended to replace advice given to you by your health care provider. Make sure you discuss any questions you have with your health care  provider. Document Revised: 11/25/2020 Document Reviewed: 08/12/2020 Elsevier Patient Education  [place].

## 2021-06-18 ENCOUNTER — Telehealth: Payer: Self-pay | Admitting: *Deleted

## 2021-06-18 NOTE — Telephone Encounter (Signed)
Spoke with patient's husband, no problems post IT pump refill.

## 2021-06-22 MED FILL — Medication: INTRATHECAL | Qty: 1 | Status: AC

## 2021-08-02 ENCOUNTER — Other Ambulatory Visit: Payer: Self-pay

## 2021-08-02 MED ORDER — PAIN MANAGEMENT IT PUMP REFILL: 1.0000 | Freq: Once | INTRATHECAL | 0 refills | Status: AC

## 2021-08-16 NOTE — Progress Notes (Signed)
PROVIDER NOTE: Interpretation of information contained herein should be left to medically-trained personnel. Specific patient instructions are provided elsewhere under "Patient Instructions" section of medical record. This document was created in part using STT-dictation technology, any transcriptional errors that may result from this process are unintentional.  ?Patient: Tiffany Hunter ?Type: Established ?DOB: 1945/10/28 ?MRN: [id number] ?PCP: Iva Lento, PA-C  Service: Procedure ?DOS: 08/17/2021 ?Setting: Ambulatory ?Location: Ambulatory outpatient facility ?Delivery: Face-to-face Provider: Gaspar Cola, MD ?Specialty: Interventional Pain Management ?Specialty designation: 09 ?Location: Outpatient facility ?Ref. Prov.: Iva Lento, PA-C   ? ?Primary Reason for Visit: Interventional Pain Management Treatment. ?CC: Back Pain (lower) ? ?Procedure:          ?Type: Management of Intrathecal Drug Delivery System (IDDS) - Reservoir Refill [phone]). No rate change.  ?Indications: ?1. Chronic pain syndrome   ?2. 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 Lumbar facet joint syndrome (Left)   ?6. DDD (degenerative disc disease), lumbosacral   ?7. Pharmacologic therapy   ?8. 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 Encounter for adjustment or management of infusion pump   ?10. Chronic use of opiate for therapeutic purpose   ?11. Opioid-induced constipation (OIC)   ?12. Encounter for chronic pain management   ? ?Pain Assessment: ?Self-Reported Pain Score: 0-No pain/10             ?Reported level is compatible with observation.       ?  ?Intrathecal Drug Delivery System (IDDS)  ?Pump Device:  ?Manufacturer: Medtronic ?Model: Synchromed II ?Model No.: [id number] ?Serial No.: [id number] ?Delivery Route: Intrathecal ?Type: Programmable   ?Volume (mL): 40 mL reservoir ?Priming Volume: N/A  ?Calibration Constant: 114.0  ?MRI compatibility: Conditional  ? ?Implant Details:  ?Date: 07/07/2017 ?Implanter: Roderic Palau, MD ?Contact Information: [facility] [phone] ?Last Revision/Replacement: 07/07/2017 ?Estimated Replacement Date: Nov/2025  ?Implant Site: Abdominal ?Laterality: Left ? ?Catheter: ?Manufacturer: Medtronic ?Model:  Intrathecal catheter ?Model No.: 8366  ?Serial No.: n/a  ?Implanted Length (cm): 28.9  ?Catheter Volume (mL): 0.209  ?Tip Location (Level): T11 (Left) ?Canal Access Site: L2-3 ? ?Drug content:  ?Primary Medication Class: Opioid  ?Medication: PF-Hydromorphone (Dilaudid)  ?Concentration: 8 mg/mL  ? ?Secondary Medication Class: Opioid  ?Medication: PF-Sufentanil  ?Concentration: 70 mcg/mL  ? ?Tertiary Medication Class: none  ?Medication: none  ? ?PA parameters (PCA-mode):  ?Mode: Off (Inactive) ? ?Programming:  ?Type: Simple continuous.  ?Medication, Concentration, Infusion Program, & Delivery Rate: For up-to-date details please see most recent scanned programming printout. ?  ?Intrathecal Pump Therapy Assessment  ?Manufacturer: Medtronic Synchromed ?Type: Programmable ?Volume: 40 mL reservoir ?MRI compatibility: Yes  ?  ?Drug content:  ?Primary Medication Class: Opioid ?Primary Medication: PF-Hydromorphone (Dilaudid) (8 mg/mL)  ?Secondary Medication: PF-Sufentanil (70 ?g/mL)  ?Other Medication: No third  ?  ?Programming:  ?Type: Simple continuous. See pump readout for details.  ?   ? ?  ?Changes:  ?Medication Change: None at this point ?Rate Change: No change in rate ? ?Reported side-effects or adverse reactions: None reported ? ?Effectiveness: Described as relatively effective, allowing for increase in activities of daily living (ADL) ?Clinically meaningful improvement in function (CMIF): Sustained CMIF goals met ? ?Plan: Pump refill today ?  ?Pharmacotherapy Assessment  ? ?Opioid Analgesic: No other oral opioid  analgesics prescribed by our practice. Intrathecal PF-Hydromorphone 3.9295 mg/day +  PF-Sufentanil 34.383 mcg/day. ?MME: [employer]. 58.7 mg/day.  ? ?Monitoring: ?[place] PMP: PDMP reviewed during this encounter.       ?Pharmacotherapy: No side-effects or adverse reactions reported. ?Compliance: No problems identified. ?Effectiveness: Clinically acceptable. ?Plan: Refer to "POC". UDS:  ?Summary  ?Date Value Ref Range Status  ?07/30/2019 Note  Final  ?  Comment:  ?  ==================================================================== ?Compliance Drug Analysis, Ur ?==================================================================== ?Test                             Result       Flag       Units ?Drug Present not Declared for Prescription Verification ?  Hydromorphone                  1757         UNEXPECTED ng/mg creat ?   Hydromorphone may be administered as a scheduled prescription ?   medication; it is also an expected metabolite of hydrocodone. ?  Naproxen                       PRESENT      UNEXPECTED ?Drug Absent but Declared for Prescription Verification ?  Salicylate                     Not Detected UNEXPECTED ?   Aspirin, as indicated in the declared medication list, is not always ?   detected even when used as directed. ?==================================================================== ?Test                      Result    Flag   Units      Ref Range ?  Creatinine              182              mg/dL      >=20 ?==================================================================== ?Declared Medications: ? The flagging and interpretation on this report are based on the ? following declared medications.  Unexpected results may arise from ? inaccuracies in the declared medications. ? **Note: The testing scope of this panel does not include small to ? moderate amounts of these reported medications: ? Aspirin ? **Note: The testing scope of this panel does not include the ? following reported medications: ? Albuterol  (Ventolin HFA) ? Atorvastatin (Lipitor) ? Betamethasone (Lotrisone) ? Carvedilol (Coreg) ? Cholecalciferol ? Clotrimazole (Lotrisone) ? Hydrocortisone ? Mirabegron (Myrbetriq) ? Naloxegol (Movantik) ? Oxybutynin (Ditropan) ? Supplement ?==================================================================== ?For clinical consultation, please call [phone]. ?==================================================================== ?  ?  ? ?Pre-op H&P Assessment:  ?Ms. Boulos is a 76 y.o. (year old), female patient, seen today for interventional treatment. She  has a past surgical history that includes Carpal tunnel release (Bilateral); Back surgery; Breast surgery (Bilateral); Cholecystectomy; Colonoscopy w/ polypectomy; Cataract extraction w/ intraocular lens implant (Right); Abdominal hysterectomy; Fracture surgery (Right); Cardiac catheterization (2012); and Pain pump revision (Left, 07/07/2017). Ms. Newhouse has a current medication list which includes the following prescription(s): atorvastatin, carvedilol, celecoxib, meloxicam, naloxegol oxalate, naloxone, NON FORMULARY, oxybutynin, ssd, and albuterol. Her primarily concern today is the Back Pain (lower) ? ?Initial Vital Signs:  ?Pulse/HCG Rate: 77  ?Temp: (!) 96.3 ?F (35.7 ?C) ?Resp: 16 ?BP: 135/65 ?SpO2: 97 % ? ?BMI: Estimated body mass index is 32.01 kg/m? as calculated from the following: ?  Height as of this encounter: '5\' 2"'  (1.575 m). ?  Weight as of  this encounter: 175 lb (79.4 kg). ? ?Risk Assessment: ?Allergies: Reviewed. She is allergic to doxycycline hyclate and sulfa antibiotics.  ?Allergy Precautions: None required ?Coagulopathies: Reviewed. None identified.  ?Blood-thinner therapy: None at this time ?Active Infection(s): Reviewed. None identified. Ms. Franson is afebrile ? ?Site Confirmation: Ms. Nazario was asked to confirm the procedure and laterality before marking the site ?Procedure checklist: Completed ?Consent: Before the procedure and  under the influence of no sedative(s), amnesic(s), or anxiolytics, the patient was informed of the treatment options, risks and possible complications. To fulfill our ethical and legal obligations, as recommended by t

## 2021-08-17 ENCOUNTER — Ambulatory Visit: Payer: Medicare Other | Attending: Pain Medicine | Admitting: Pain Medicine

## 2021-08-17 ENCOUNTER — Encounter: Payer: Self-pay | Admitting: Pain Medicine

## 2021-08-17 VITALS — BP 135/65 | HR 77 | Temp 96.3°F | Resp 16 | Ht 62.0 in | Wt 175.0 lb

## 2021-08-17 DIAGNOSIS — G8929 Other chronic pain: Secondary | ICD-10-CM

## 2021-08-17 DIAGNOSIS — Z451 Encounter for adjustment and management of infusion pump: Secondary | ICD-10-CM | POA: Diagnosis not present

## 2021-08-17 DIAGNOSIS — M961 Postlaminectomy syndrome, not elsewhere classified: Secondary | ICD-10-CM | POA: Diagnosis not present

## 2021-08-17 DIAGNOSIS — M5137 Other intervertebral disc degeneration, lumbosacral region: Secondary | ICD-10-CM | POA: Diagnosis not present

## 2021-08-17 DIAGNOSIS — M5442 Lumbago with sciatica, left side: Secondary | ICD-10-CM | POA: Insufficient documentation

## 2021-08-17 DIAGNOSIS — G894 Chronic pain syndrome: Secondary | ICD-10-CM | POA: Insufficient documentation

## 2021-08-17 DIAGNOSIS — T402X5A Adverse effect of other opioids, initial encounter: Secondary | ICD-10-CM

## 2021-08-17 DIAGNOSIS — K5903 Drug induced constipation: Secondary | ICD-10-CM | POA: Diagnosis not present

## 2021-08-17 DIAGNOSIS — Z978 Presence of other specified devices: Secondary | ICD-10-CM | POA: Insufficient documentation

## 2021-08-17 DIAGNOSIS — Z79891 Long term (current) use of opiate analgesic: Secondary | ICD-10-CM | POA: Diagnosis not present

## 2021-08-17 DIAGNOSIS — M79662 Pain in left lower leg: Secondary | ICD-10-CM | POA: Insufficient documentation

## 2021-08-17 DIAGNOSIS — M47816 Spondylosis without myelopathy or radiculopathy, lumbar region: Secondary | ICD-10-CM | POA: Diagnosis not present

## 2021-08-17 DIAGNOSIS — Z79899 Other long term (current) drug therapy: Secondary | ICD-10-CM

## 2021-08-17 NOTE — Patient Instructions (Signed)
Opioid Overdose ?Opioids are drugs that are often used to treat pain. Opioids include illegal drugs, such as heroin, as well as prescription pain medicines, such as codeine, morphine, hydrocodone, and fentanyl. ?An opioid overdose happens when you take too much of an opioid. An overdose may be intentional or accidental and can happen with any type of opioid. ?The effects of an overdose can be mild, dangerous, or even deadly. Opioid overdose is a medical emergency. ?What are the causes? ?This condition may be caused by: ?Taking too much of an opioid on purpose. ?Taking too much of an opioid by accident. ?Using two or more substances that contain opioids at the same time. ?Taking an opioid with a substance that affects your heart, breathing, or blood pressure. These include alcohol, tranquilizers, sleeping pills, illegal drugs, and some over-the-counter medicines. ?This condition may also happen due to an error made by: ?A health care provider who prescribes a medicine. ?The pharmacist who fills the prescription. ?What increases the risk? ?This condition is more likely in: ?Children. They may be attracted to colorful pills. Because of a child's small size, even a small amount of a medicine can be dangerous. ?Older people. They may be taking many different medicines. Older people may have difficulty reading labels or remembering when they last took their medicines. They may also be more sensitive to the effects of opioids. ?People with chronic medical conditions, especially heart, liver, kidney, or neurological diseases. ?People who take an opioid for a long period of time. ?People who take opioids and use illegal drugs, such as heroin, or other substances, such as alcohol. ?People who: ?Have a history of drug or alcohol abuse. ?Have certain mental health conditions. ?Have a history of previous drug overdoses. ?People who take opioids that are not prescribed for them. ?What are the signs or symptoms? ?Symptoms of this  condition depend on the type of opioid and the amount that was taken. Common symptoms include: ?Sleepiness or difficulty waking from sleep. ?Confusion. ?Slurred speech. ?Slowed breathing and a slow pulse (bradycardia). ?Nausea and vomiting. ?Abnormally small pupils. ?Signs and symptoms that require emergency treatment include: ?Cold, clammy, and pale skin. ?Blue lips and fingernails. ?Vomiting. ?Gurgling sounds in the throat. ?A pulse that is very slow or difficult to detect. ?Breathing that is very irregular, slow, noisy, or difficult to detect. ?Inability to respond to speech or be awakened from sleep (stupor). ?Seizures. ?How is this diagnosed? ?This condition is diagnosed based on your symptoms and medical history. It is important to tell your health care provider: ?About all of the opioids that you took. ?When you took the opioids. ?Whether you were drinking alcohol or using marijuana, cocaine, or other drugs. ?Your health care provider will do a physical exam. This exam may include: ?Checking and monitoring your heart rate and rhythm, breathing rate, temperature, and blood pressure. ?Measuring oxygen levels in your blood. ?Checking for abnormally small pupils. ?You may also have blood tests or urine tests. You may have X-rays if you are having severe breathing problems. ?How is this treated? ?This condition requires immediate medical treatment and hospitalization. Reversing the effects of the opioid is the first step in treatment. If you have a Narcan kit or naloxone, use it right away. Follow your health care provider's instructions. A friend or family member can also help you with this. ?The rest of your treatment will be given in the hospital intensive care (ICU). Treatment in the hospital may include: ?Giving salts and minerals (electrolytes) along with fluids through  an IV. ?Inserting a breathing tube (endotracheal tube) in your airway to help you breathe if you cannot breathe on your own or you are in  danger of not being able to breathe on your own. ?Giving oxygen through a small tube under your nose. ?Passing a tube through your nose and into your stomach (nasogastric tube, or NG tube) to empty your stomach. ?Giving medicines that: ?Increase your blood pressure. ?Relieve nausea and vomiting. ?Relieve abdominal pain and cramping. ?Reverse the effects of the opioid (naloxone). ?Monitoring your heart and oxygen levels. ?Ongoing counseling and mental health support if you intentionally overdosed or used an illegal drug. ?Follow these instructions at home: ?Medicines ?Take over-the-counter and prescription medicines only as told by your health care provider. ?Always ask your health care provider about possible side effects and interactions of any new medicine that you start taking. ?Keep a list of all the medicines that you take, including over-the-counter medicines. Bring this list with you to all your medical visits. ?General instructions ?Drink enough fluid to keep your urine pale yellow. ?Keep all follow-up visits. This is important. ?How is this prevented? ?Read the drug inserts that come with your opioid pain medicines. ?Take medicines only as told by your health care provider. Do not take more medicine than you are told. Do not take medicines more frequently than you are told. ?Do not drink alcohol or take sedatives when taking opioids. ?Do not use illegal or recreational drugs, including cocaine, ecstasy, and marijuana. ?Do not take opioid medicines that are not prescribed for you. ?Store all medicines in safety containers that are out of the reach of children. ?Get help if you are struggling with: ?Alcohol or drug use. ?Depression or another mental health problem. ?Thoughts of hurting yourself or another person. ?Keep the phone number of your local poison control center near your phone or in your mobile phone. In the [place] the hotline of the National Poison Control Center is (800) 222-1222. ?If you were  prescribed naloxone, make sure you understand how to take it. ?Contact a health care provider if: ?You need help understanding how to take your pain medicines. ?You feel your medicines are too strong. ?You are concerned that your pain medicines are not working well for your pain. ?You develop new symptoms or side effects when you are taking medicines. ?Get help right away if: ?You or someone else is having symptoms of an opioid overdose. Get help even if you are not sure. ?You have thoughts about hurting yourself or others. ?You have: ?Chest pain. ?Difficulty breathing. ?A loss of consciousness. ?These symptoms may represent a serious problem that is an emergency. Do not wait to see if the symptoms will go away. Get medical help right away. Call your local emergency services (911 in the [place] Do not drive yourself to the hospital. ?If you ever feel like you may hurt yourself or others, or have thoughts about taking your own life, get help right away. You can go to your nearest emergency department or: ?Call your local emergency services (911 in the [place] ?Call a suicide crisis helpline, such as the National Suicide Prevention Lifeline at 1-800-273-8255 or 988 in the [place] This is open 24 hours a day in the [place] ?Text the Crisis Text Line at 741741 (in the [place] ?Summary ?Opioids are drugs that are often used to treat pain. Opioids include illegal drugs, such as heroin, as well as prescription pain medicines. ?An opioid overdose happens when you take too much of an opioid. ?  Overdoses can be intentional or accidental. ?Opioid overdose is very dangerous. It is a life-threatening emergency. ?If you or someone you know is experiencing an opioid overdose, get help right away. ?This information is not intended to replace advice given to you by your health care provider. Make sure you discuss any questions you have with your health care provider. ?Document Revised: 11/25/2020 Document Reviewed: 08/12/2020 ?Elsevier  Patient Education ? [place]. ? ?

## 2021-08-18 ENCOUNTER — Telehealth: Payer: Self-pay

## 2021-08-18 MED FILL — Medication: INTRATHECAL | Qty: 1 | Status: AC

## 2021-08-18 NOTE — Telephone Encounter (Signed)
Her husband called back and said she is doing good.  ?

## 2021-08-18 NOTE — Telephone Encounter (Signed)
Post IT pump refill follow up phone call.  LM ?

## 2021-10-06 ENCOUNTER — Other Ambulatory Visit: Payer: Self-pay

## 2021-10-06 MED ORDER — PAIN MANAGEMENT IT PUMP REFILL: 1.0000 | Freq: Once | INTRATHECAL | 0 refills | Status: AC

## 2021-10-26 ENCOUNTER — Other Ambulatory Visit: Payer: Self-pay

## 2021-10-26 ENCOUNTER — Encounter: Payer: Self-pay | Admitting: Pain Medicine

## 2021-10-26 ENCOUNTER — Ambulatory Visit: Payer: Medicare Other | Attending: Pain Medicine | Admitting: Pain Medicine

## 2021-10-26 DIAGNOSIS — M961 Postlaminectomy syndrome, not elsewhere classified: Secondary | ICD-10-CM | POA: Insufficient documentation

## 2021-10-26 DIAGNOSIS — M5137 Other intervertebral disc degeneration, lumbosacral region: Secondary | ICD-10-CM | POA: Diagnosis present

## 2021-10-26 DIAGNOSIS — Z451 Encounter for adjustment and management of infusion pump: Secondary | ICD-10-CM | POA: Insufficient documentation

## 2021-10-26 DIAGNOSIS — K5903 Drug induced constipation: Secondary | ICD-10-CM | POA: Diagnosis present

## 2021-10-26 DIAGNOSIS — Z79899 Other long term (current) drug therapy: Secondary | ICD-10-CM | POA: Insufficient documentation

## 2021-10-26 DIAGNOSIS — G8929 Other chronic pain: Secondary | ICD-10-CM | POA: Insufficient documentation

## 2021-10-26 DIAGNOSIS — Z978 Presence of other specified devices: Secondary | ICD-10-CM | POA: Diagnosis present

## 2021-10-26 DIAGNOSIS — T402X5A Adverse effect of other opioids, initial encounter: Secondary | ICD-10-CM | POA: Insufficient documentation

## 2021-10-26 DIAGNOSIS — Z79891 Long term (current) use of opiate analgesic: Secondary | ICD-10-CM | POA: Insufficient documentation

## 2021-10-26 DIAGNOSIS — M5442 Lumbago with sciatica, left side: Secondary | ICD-10-CM | POA: Insufficient documentation

## 2021-10-26 DIAGNOSIS — G894 Chronic pain syndrome: Secondary | ICD-10-CM | POA: Insufficient documentation

## 2021-10-26 DIAGNOSIS — M79605 Pain in left leg: Secondary | ICD-10-CM | POA: Diagnosis present

## 2021-10-26 DIAGNOSIS — M47816 Spondylosis without myelopathy or radiculopathy, lumbar region: Secondary | ICD-10-CM | POA: Insufficient documentation

## 2021-10-26 MED FILL — Medication: INTRATHECAL | Qty: 1 | Status: AC

## 2021-10-26 NOTE — Progress Notes (Signed)
PROVIDER NOTE: Interpretation of information contained herein should be left to medically-trained personnel. Specific patient instructions are provided elsewhere under "Patient Instructions" section of medical record. This document was created in part using STT-dictation technology, any transcriptional errors that may result from this process are unintentional.  Patient: Tiffany Hunter Type: Established DOB: 05/18/1945 MRN: [id number] PCP: Iva Lento, PA-C  Service: Procedure DOS: 10/26/2021 Setting: Ambulatory Location: Ambulatory outpatient facility Delivery: Face-to-face Provider: Gaspar Cola, MD Specialty: Interventional Pain Management Specialty designation: 09 Location: Outpatient facility Ref. Prov.: Iva Lento, PA-C    Primary Reason for Visit: Interventional Pain Management Treatment. CC: Leg Pain (Bilateral )  Procedure:          Type: Management of Intrathecal Drug Delivery System (IDDS) - Reservoir Refill [phone]). No rate change.  Indications: 1. Chronic pain syndrome   2. 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 Lumbar facet joint syndrome (Left)   6. DDD (degenerative disc disease), lumbosacral   7. Pharmacologic therapy   8. 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 Encounter for adjustment or management of infusion pump   10. Chronic use of opiate for therapeutic purpose   11. Opioid-induced constipation (OIC)   12. Encounter for chronic pain management    Pain Assessment: Self-Reported Pain Score: 8 /10             Reported level is compatible with observation.        Today the patient indicates lately experiencing some more pain in the lower extremities which she refers goes down through the anterior portion of the leg all the way down into the top of her feet.  She  does seem to have some left lower extremity swelling when compared to the right.  Pulses for the dorsalis pedis and the posterior tibialis are both within normal limits.  Patrick maneuver was positive bilaterally for decreased range of motion of the hip joints and hip joint arthralgia, bilaterally.  The patient indicates that the pain is not bad enough to need anything done, but she states that she will let us know if she needs our assistance.    Intrathecal Drug Delivery System (IDDS)  Pump Device:  Manufacturer: Medtronic Model: Synchromed II Model No.: [id number] Serial No.: [id number] H Delivery Route: Intrathecal Type: Programmable  Volume (mL): 40 mL reservoir Priming Volume: N/A  Calibration Constant: 114.0  MRI compatibility: Conditional   Implant Details:  Date: 07/07/2017 Implanter: Roderic Palau, MD Contact Information: [facility] [phone] Last Revision/Replacement: 07/07/2017 Estimated Replacement Date: Nov/2025  Implant Site: Abdominal Laterality: Left  Catheter: Manufacturer: Medtronic Model:  Intrathecal catheter Model No.: 8731  Serial No.: n/a  Implanted Length (cm): 28.9  Catheter Volume (mL): 0.209  Tip Location (Level): T11 (Left) Canal Access Site: L2-3  Drug content:  Primary Medication Class: Opioid  Medication: PF-Hydromorphone (Dilaudid)  Concentration: 8 mg/mL   Secondary Medication Class: Opioid  Medication: PF-Sufentanil  Concentration: 70 mcg/mL   Tertiary Medication Class: none  Medication: none   PA parameters (PCA-mode):  Mode: Off (Inactive)  Programming:  Type: Simple continuous.  Medication, Concentration, Infusion Program, & Delivery Rate: For up-to-date details please see most recent scanned programming printout.   Intrathecal Pump Therapy Assessment  Manufacturer: Medtronic Synchromed Type: Programmable Volume: 40 mL reservoir MRI compatibility: Yes    Drug  content:  Primary Medication Class:  Opioid Primary Medication: PF-Hydromorphone (Dilaudid) (8 mg/mL)  Secondary Medication: PF-Sufentanil (70 g/mL)  Other Medication: No third    Programming:  Type: Simple continuous. See pump readout for details.         Changes:  Medication Change: None at this point Rate Change: No change in rate  Reported side-effects or adverse reactions: None reported  Effectiveness: Described as relatively effective, allowing for increase in activities of daily living (ADL) Clinically meaningful improvement in function (CMIF): Sustained CMIF goals met  Plan: Pump refill today   Pharmacotherapy Assessment   Opioid Analgesic: No other oral opioid analgesics prescribed by our practice. Intrathecal PF-Hydromorphone 3.9295 mg/day + PF-Sufentanil 34.383 mcg/day. MME: Aprox. 58.7 mg/day.   Monitoring: [place] PMP: PDMP reviewed during this encounter.       Pharmacotherapy: No side-effects or adverse reactions reported. Compliance: No problems identified. Effectiveness: Clinically acceptable. Plan: Refer to "POC". UDS:  Summary  Date Value Ref Range Status  07/30/2019 Note  Final    Comment:    ==================================================================== Compliance Drug Analysis, Ur ==================================================================== Test                             Result       Flag       Units Drug Present not Declared for Prescription Verification   Hydromorphone                  1757         UNEXPECTED ng/mg creat    Hydromorphone may be administered as a scheduled prescription    medication; it is also an expected metabolite of hydrocodone.   Naproxen                       PRESENT      UNEXPECTED Drug Absent but Declared for Prescription Verification   Salicylate                     Not Detected UNEXPECTED    Aspirin, as indicated in the declared medication list, is not always    detected even when used as  directed. ==================================================================== Test                      Result    Flag   Units      Ref Range   Creatinine              182              mg/dL      >=20 ==================================================================== Declared Medications:  The flagging and interpretation on this report are based on the  following declared medications.  Unexpected results may arise from  inaccuracies in the declared medications.  **Note: The testing scope of this panel does not include small to  moderate amounts of these reported medications:  Aspirin  **Note: The testing scope of this panel does not include the  following reported medications:  Albuterol (Ventolin HFA)  Atorvastatin (Lipitor)  Betamethasone (Lotrisone)  Carvedilol (Coreg)  Cholecalciferol  Clotrimazole (Lotrisone)  Hydrocortisone  Mirabegron (Myrbetriq)  Naloxegol (Movantik)  Oxybutynin (Ditropan)  Supplement ==================================================================== For clinical consultation, please call [phone]. ====================================================================      Pre-op H&P Assessment:  Ms. Saldarriaga is a 76 y.o. (year old), female patient, seen today for interventional treatment. She  has a past surgical history that includes Carpal  tunnel release (Bilateral); Back surgery; Breast surgery (Bilateral); Cholecystectomy; Colonoscopy w/ polypectomy; Cataract extraction w/ intraocular lens implant (Right); Abdominal hysterectomy; Fracture surgery (Right); Cardiac catheterization (2012); and Pain pump revision (Left, 07/07/2017). Ms. Monterrosa has a current medication list which includes the following prescription(s): atorvastatin, carvedilol, celecoxib, diclofenac sodium, losartan, meloxicam, naloxegol oxalate, naloxone, NON FORMULARY, omeprazole, oxybutynin, ssd, and albuterol. Her primarily concern today is the Leg Pain (Bilateral )  Initial  Vital Signs:  Pulse/HCG Rate:    Temp:   Resp:   BP:   SpO2:    BMI: Estimated body mass index is 32.01 kg/m as calculated from the following:   Height as of 08/17/21: _0  (1.575 m).   Weight as of 08/17/21: 175 lb (79.4 kg).  Risk Assessment: Allergies: Reviewed. She is allergic to doxycycline hyclate and sulfa antibiotics.  Allergy Precautions: None required Coagulopathies: Reviewed. None identified.  Blood-thinner therapy: None at this time Active Infection(s): Reviewed. None identified. Ms. Steinberger is afebrile  Site Confirmation: Ms. Sinnett was asked to confirm the procedure and laterality before marking the site Procedure checklist: Completed Consent: Before the procedure and under the influence of no sedative(s), amnesic(s), or anxiolytics, the patient was informed of the treatment options, risks and possible complications. To fulfill our ethical and legal obligations, as recommended by the American Medical Association's Code of Ethics, I have informed the patient of my clinical impression; the nature and purpose of the treatment or procedure; the risks, benefits, and possible complications of the intervention; the alternatives, including doing nothing; the risk(s) and benefit(s) of the alternative treatment(s) or procedure(s); and the risk(s) and benefit(s) of doing nothing.  Ms. Blankenbaker was provided with information about the general risks and possible complications associated with most interventional procedures. These include, but are not limited to: failure to achieve desired goals, infection, bleeding, organ or nerve damage, allergic reactions, paralysis, and/or death.  In addition, she was informed of those risks and possible complications associated to this particular procedure, which include, but are not limited to: damage to the implant; failure to decrease pain; local, systemic, or serious CNS infections, intraspinal abscess with possible cord compression and paralysis, or  life-threatening such as meningitis; bleeding; organ damage; nerve injury or damage with subsequent sensory, motor, and/or autonomic system dysfunction, resulting in transient or permanent pain, numbness, and/or weakness of one or several areas of the body; allergic reactions, either minor or major life-threatening, such as anaphylactic or anaphylactoid reactions.  Furthermore, Ms. Stickles was informed of those risks and complications associated with the medications. These include, but are not limited to: allergic reactions (i.e.: anaphylactic or anaphylactoid reactions); endorphine suppression; bradycardia and/or hypotension; water retention and/or peripheral vascular relaxation leading to lower extremity edema and possible stasis ulcers; respiratory depression and/or shortness of breath; decreased metabolic rate leading to weight gain; swelling or edema; medication-induced neural toxicity; particulate matter embolism and blood vessel occlusion with resultant organ, and/or nervous system infarction; and/or intrathecal granuloma formation with possible spinal cord compression and permanent paralysis.  Before refilling the pump Ms. Rostro was informed that some of the medications used in the devise may not be FDA approved for such use and therefore it constitutes an off-label use of the medications.  Finally, she was informed that Medicine is not an exact science; therefore, there is also the possibility of unforeseen or unpredictable risks and/or possible complications that may result in a catastrophic outcome. The patient indicated having understood very clearly. We have given the patient no guarantees and we have  made no promises. Enough time was given to the patient to ask questions, all of which were answered to the patient's satisfaction. Ms. Grudzinski has indicated that she wanted to continue with the procedure. Attestation: I, the ordering provider, attest that I have discussed with the patient the  benefits, risks, side-effects, alternatives, likelihood of achieving goals, and potential problems during recovery for the procedure that I have provided informed consent. Date  Time:   Pre-Procedure Preparation:  Monitoring: As per clinic protocol. Respiration, ETCO2, SpO2, BP, heart rate and rhythm monitor placed and checked for adequate function Safety Precautions: Patient was assessed for positional comfort and pressure points before starting the procedure. Time-out: I initiated and conducted the "Time-out" before starting the procedure, as per protocol. The patient was asked to participate by confirming the accuracy of the "Time Out" information. Verification of the correct person, site, and procedure were performed and confirmed by me, the nursing staff, and the patient. "Time-out" conducted as per Joint Commission's Universal Protocol (UP.01.01.01). Time: 1308  Description of Procedure:          Position: Supine Target Area: Central-port of intrathecal pump. Approach: Anterior, 90 degree angle approach. Area Prepped: Entire Area around the pump implant. DuraPrep (Iodine Povacrylex [0.7% available iodine] and Isopropyl Alcohol, 74% w/w) Safety Precautions: Aspiration looking for blood return was conducted prior to all injections. At no point did we inject any substances, as a needle was being advanced. No attempts were made at seeking any paresthesias. Safe injection practices and needle disposal techniques used. Medications properly checked for expiration dates. SDV (single dose vial) medications used. Description of the Procedure: Protocol guidelines were followed. Two nurses trained to do implant refills were present during the entire procedure. The refill medication was checked by both healthcare providers as well as the patient. The patient was included in the "Time-out" to verify the medication. The patient was placed in position. The pump was identified. The area was prepped in the  usual manner. The sterile template was positioned over the pump, making sure the side-port location matched that of the pump. Both, the pump and the template were held for stability. The needle provided in the Medtronic Kit was then introduced thru the center of the template and into the central port. The pump content was aspirated and discarded volume documented. The new medication was slowly infused into the pump, thru the filter, making sure to avoid overpressure of the device. The needle was then removed and the area cleansed, making sure to leave some of the prepping solution back to take advantage of its long term bactericidal properties. The pump was interrogated and programmed to reflect the correct medication, volume, and dosage. The program was printed and taken to the physician for approval. Once checked and signed by the physician, a copy was provided to the patient and another scanned into the EMR.  There were no vitals filed for this visit.  Start Time: 1311 hrs. End Time: 1317 hrs. Materials & Medications: Medtronic Refill Kit Medication(s): Please see chart orders for details.  Imaging Guidance:          Type of Imaging Technique: None used Indication(s): N/A Exposure Time: No patient exposure Contrast: None used. Fluoroscopic Guidance: N/A Ultrasound Guidance: N/A Interpretation: N/A  Antibiotic Prophylaxis:   Anti-infectives (From admission, onward)    None      Indication(s): None identified  Post-operative Assessment:  Post-procedure Vital Signs:  Pulse/HCG Rate:    Temp:   Resp:   BP:  SpO2:    EBL: None  Complications: No immediate post-treatment complications observed by team, or reported by patient.  Note: The patient tolerated the entire procedure well. A repeat set of vitals were taken after the procedure and the patient was kept under observation following institutional policy, for this type of procedure. Post-procedural neurological assessment was  performed, showing return to baseline, prior to discharge. The patient was provided with post-procedure discharge instructions, including a section on how to identify potential problems. Should any problems arise concerning this procedure, the patient was given instructions to immediately contact us, at any time, without hesitation. In any case, we plan to contact the patient by telephone for a follow-up status report regarding this interventional procedure.  Comments:  No additional relevant information.  Plan of Care  Orders:  Orders Placed This Encounter  Procedures   PUMP REFILL    Maintain Protocol by having two(2) healthcare providers during procedure and programming.    Scheduling Instructions:     Please refill intrathecal pump today.    Order Specific Question:   Where will this procedure be performed?    Answer:   [facility]   PUMP REFILL    Whenever possible schedule on a procedure today.    Standing Status:   Future    Standing Expiration Date:   02/25/2022    Scheduling Instructions:     Please schedule intrathecal pump refill based on pump programming. Avoid schedule intervals of more than 120 days (4 months).    Order Specific Question:   Where will this procedure be performed?    Answer:   [facility]   Informed Consent Details: Physician/Practitioner Attestation; Transcribe to consent form and obtain patient signature    Transcribe to consent form and obtain patient signature.    Order Specific Question:   Physician/Practitioner attestation of informed consent for procedure/surgical case    Answer:   I, the physician/practitioner, attest that I have discussed with the patient the benefits, risks, side effects, alternatives, likelihood of achieving goals and potential problems during recovery for the procedure that I have provided informed consent.    Order Specific Question:   Procedure    Answer:   Intrathecal pump refill    Order Specific Question:    Physician/Practitioner performing the procedure    Answer:   Attending Physician: Kathlen Brunswick. Dossie Arbour, MD & designated trained staff    Order Specific Question:   Indication/Reason    Answer:   Chronic Pain Syndrome (G89.4), presence of an intrathecal pump (Z97.8)   Chronic Opioid Analgesic:  No other oral opioid analgesics prescribed by our practice. Intrathecal PF-Hydromorphone 3.9295 mg/day + PF-Sufentanil 34.383 mcg/day. MME: Aprox. 58.7 mg/day.   Medications ordered for procedure: No orders of the defined types were placed in this encounter.  Medications administered: [name] had no medications administered during this visit.  See the medical record for exact dosing, route, and time of administration.  Follow-up plan:   Return for Pump Refill (Max:37mo.       Interventional Therapies  Risk  Complexity Considerations:   Estimated body mass index is 32.4 kg/m as calculated from the following:   Height as of this encounter: 5' 2.5" (1.588 m).   Weight as of this encounter: 180 lb (81.6 kg). Presence of intrathecal pump  DM  CAD  Pulmonary hypertension    Planned  Pending:   Palliative intrathecal pump refills    Under consideration:   Diagnostic left lumbar facet MBB #3  Possible left lumbar facet RFA #1   Completed:   Diagnostic left lumbar facet MBB x2 (12/22/2016) (07/14/2020) (100/100/100/100)   Therapeutic  Palliative (PRN) options:   Therapeutic/palliative left sided lumbar facet MBB #3         Recent Visits Date Type Provider Dept  08/17/21 Procedure visit Milinda Pointer, MD [facility]  Showing recent visits within past 90 days and meeting all other requirements Today's Visits Date Type Provider Dept  10/26/21 Procedure visit Milinda Pointer, MD [facility]  Showing today's visits and meeting all other requirements Future Appointments Date Type Provider Dept  01/04/22 Appointment Milinda Pointer, MD [facility]  Showing future appointments within next 90 days and meeting all other requirements  Disposition: Discharge home  Discharge (Date  Time): 10/26/2021; 1330 hrs.   Primary Care Physician: Iva Lento, PA-C Location: [facility] Note by: Gaspar Cola, MD Date: 10/26/2021; Time: 5:07 PM  Disclaimer:  Medicine is not an Chief Strategy Officer. The only guarantee in medicine is that nothing is guaranteed. It is important to note that the decision to proceed with this intervention was based on the information collected from the patient. The Data and conclusions were drawn from the patient's questionnaire, the interview, and the physical examination. Because the information was provided in large part by the patient, it cannot be guaranteed that it has not been purposely or unconsciously manipulated. Every effort has been made to obtain as much relevant data as possible for this evaluation. It is important to note that the conclusions that lead to this procedure are derived in large part from the available data. Always take into account that the treatment will also be dependent on availability of resources and existing treatment guidelines, considered by other Pain Management Practitioners as being common knowledge and practice, at the time of the intervention. For Medico-Legal purposes, it is also important to point out that variation in procedural techniques and pharmacological choices are the acceptable norm. The indications, contraindications, technique, and results of the above procedure should only be interpreted and judged by a Board-Certified Interventional Pain Specialist with extensive familiarity and expertise in the same exact procedure and technique.

## 2021-10-26 NOTE — Patient Instructions (Signed)

## 2021-10-26 NOTE — Progress Notes (Signed)
Safety precautions to be maintained throughout the outpatient stay will include: orient to surroundings, keep bed in low position, maintain call bell within reach at all times, provide assistance with transfer out of bed and ambulation.  

## 2021-10-27 ENCOUNTER — Telehealth: Payer: Self-pay

## 2021-10-27 NOTE — Telephone Encounter (Signed)
Post procedure phone call.  Patient states she is doing well.  

## 2021-11-03 DIAGNOSIS — R0902 Hypoxemia: Secondary | ICD-10-CM | POA: Insufficient documentation

## 2021-11-03 DIAGNOSIS — K760 Fatty (change of) liver, not elsewhere classified: Secondary | ICD-10-CM | POA: Insufficient documentation

## 2021-11-03 DIAGNOSIS — K8689 Other specified diseases of pancreas: Secondary | ICD-10-CM | POA: Insufficient documentation

## 2021-11-04 DIAGNOSIS — K7460 Unspecified cirrhosis of liver: Secondary | ICD-10-CM | POA: Insufficient documentation

## 2021-11-04 DIAGNOSIS — J9601 Acute respiratory failure with hypoxia: Secondary | ICD-10-CM | POA: Insufficient documentation

## 2021-11-04 DIAGNOSIS — K2270 Barrett's esophagus without dysplasia: Secondary | ICD-10-CM | POA: Insufficient documentation

## 2021-11-04 DIAGNOSIS — K219 Gastro-esophageal reflux disease without esophagitis: Secondary | ICD-10-CM | POA: Insufficient documentation

## 2021-11-18 ENCOUNTER — Encounter: Payer: Medicare Other | Admitting: Pain Medicine

## 2021-12-09 ENCOUNTER — Other Ambulatory Visit: Payer: Self-pay

## 2021-12-09 MED ORDER — PAIN MANAGEMENT IT PUMP REFILL: 1.0000 | Freq: Once | INTRATHECAL | 0 refills | Status: AC

## 2021-12-09 NOTE — Progress Notes (Signed)
It pump

## 2022-01-03 NOTE — Progress Notes (Unsigned)
PROVIDER NOTE: Interpretation of information contained herein should be left to medically-trained personnel. Specific patient instructions are provided elsewhere under "Patient Instructions" section of medical record. This document was created in part using STT-dictation technology, any transcriptional errors that may result from this process are unintentional.  Patient: Tiffany Hunter Type: Established DOB: December 09, 1945 MRN: [id number] PCP: Iva Lento, PA-C  Service: Procedure DOS: 01/04/2022 Setting: Ambulatory Location: Ambulatory outpatient facility Delivery: Face-to-face Provider: Gaspar Cola, MD Specialty: Interventional Pain Management Specialty designation: 09 Location: Outpatient facility Ref. Prov.: Iva Lento, PA-C    Primary Reason for Visit: Interventional Pain Management Treatment. CC: No chief complaint on file.  Procedure:          Type: Management of Intrathecal Drug Delivery System (IDDS) - Reservoir Refill [phone]). No rate change.  Indications: 1. Chronic pain syndrome   2. 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 Lumbar facet joint syndrome (Left)   6. DDD (degenerative disc disease), lumbosacral   7. Pharmacologic therapy   8. 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 Encounter for adjustment or management of infusion pump   10. Chronic use of opiate for therapeutic purpose   11. Opioid-induced constipation (OIC)   12. Encounter for chronic pain management    Pain Assessment: Self-Reported Pain Score:  /10             Reported level is compatible with observation.          Intrathecal Drug Delivery System (IDDS)  Pump Device:  Manufacturer: Medtronic Model: Synchromed II Model No.: [id number] Serial No.: [id number] H Delivery Route: Intrathecal Type: Programmable   Volume (mL): 40 mL reservoir Priming Volume: N/A  Calibration Constant: 114.0  MRI compatibility: Conditional   Implant Details:  Date: 07/07/2017 Implanter: Roderic Palau, MD Contact Information: [facility] [phone] Last Revision/Replacement: 07/07/2017 Estimated Replacement Date: Nov/2025  Implant Site: Abdominal Laterality: Left  Catheter: Manufacturer: Medtronic Model:  Intrathecal catheter Model No.: 8731  Serial No.: n/a  Implanted Length (cm): 28.9  Catheter Volume (mL): 0.209  Tip Location (Level): T11 (Left) Canal Access Site: L2-3  Drug content:  Primary Medication Class: Opioid  Medication: PF-Hydromorphone (Dilaudid)  Concentration: 8 mg/mL   Secondary Medication Class: Opioid  Medication: PF-Sufentanil  Concentration: 70 mcg/mL   Tertiary Medication Class: none  Medication: none   PA parameters (PCA-mode):  Mode: Off (Inactive)  Programming:  Type: Simple continuous.  Medication, Concentration, Infusion Program, & Delivery Rate: For up-to-date details please see most recent scanned programming printout.   Intrathecal Pump Therapy Assessment  Manufacturer: Medtronic Synchromed Type: Programmable Volume: 40 mL reservoir MRI compatibility: Yes    Drug content:  Primary Medication Class: Opioid Primary Medication: PF-Hydromorphone (Dilaudid) (8 mg/mL)  Secondary Medication: PF-Sufentanil (70 g/mL)  Other Medication: No third    Programming:  Type: Simple continuous. See pump readout for details.          Changes:  Medication Change: None at this point Rate Change: No change in rate  Reported side-effects or adverse reactions: None reported  Effectiveness: Described as relatively effective, allowing for increase in activities of daily living (ADL) Clinically meaningful improvement in function (CMIF): Sustained CMIF goals met  Plan: Pump refill today   Pharmacotherapy Assessment   Opioid Analgesic: No other oral  opioid analgesics prescribed by our practice.  Intrathecal PF-Hydromorphone 3.9295 mg/day + PF-Sufentanil 34.383 mcg/day. MME: Aprox. 58.7 mg/day.   Monitoring: [place] PMP: PDMP reviewed during this encounter.       Pharmacotherapy: No side-effects or adverse reactions reported. Compliance: No problems identified. Effectiveness: Clinically acceptable. Plan: Refer to "POC". UDS:  Summary  Date Value Ref Range Status  07/30/2019 Note  Final    Comment:    ==================================================================== Compliance Drug Analysis, Ur ==================================================================== Test                             Result       Flag       Units Drug Present not Declared for Prescription Verification   Hydromorphone                  1757         UNEXPECTED ng/mg creat    Hydromorphone may be administered as a scheduled prescription    medication; it is also an expected metabolite of hydrocodone.   Naproxen                       PRESENT      UNEXPECTED Drug Absent but Declared for Prescription Verification   Salicylate                     Not Detected UNEXPECTED    Aspirin, as indicated in the declared medication list, is not always    detected even when used as directed. ==================================================================== Test                      Result    Flag   Units      Ref Range   Creatinine              182              mg/dL      >=20 ==================================================================== Declared Medications:  The flagging and interpretation on this report are based on the  following declared medications.  Unexpected results may arise from  inaccuracies in the declared medications.  **Note: The testing scope of this panel does not include small to  moderate amounts of these reported medications:  Aspirin  **Note: The testing scope of this panel does not include the  following reported medications:  Albuterol  (Ventolin HFA)  Atorvastatin (Lipitor)  Betamethasone (Lotrisone)  Carvedilol (Coreg)  Cholecalciferol  Clotrimazole (Lotrisone)  Hydrocortisone  Mirabegron (Myrbetriq)  Naloxegol (Movantik)  Oxybutynin (Ditropan)  Supplement ==================================================================== For clinical consultation, please call [phone]. ====================================================================    No results found for: "CBDTHCR", "D8THCCBX", "D9THCCBX"   Pre-op H&P Assessment:  Ms. Milson is a 76 y.o. (year old), female patient, seen today for interventional treatment. She  has a past surgical history that includes Carpal tunnel release (Bilateral); Back surgery; Breast surgery (Bilateral); Cholecystectomy; Colonoscopy w/ polypectomy; Cataract extraction w/ intraocular lens implant (Right); Abdominal hysterectomy; Fracture surgery (Right); Cardiac catheterization (2012); and Pain pump revision (Left, 07/07/2017). Ms. Perren has a current medication list which includes the following prescription(s): albuterol, atorvastatin, carvedilol, celecoxib, diclofenac sodium, losartan, meloxicam, naloxegol oxalate, NON FORMULARY, omeprazole, oxybutynin, and ssd. Her primarily concern today is the No chief complaint on file.  Initial Vital Signs:  Pulse/HCG Rate:    Temp:   Resp:   BP:   SpO2:    BMI: Estimated body mass index is 32.01 kg/m as calculated from the  following:   Height as of 08/17/21: _0  (1.575 m).   Weight as of 08/17/21: 175 lb (79.4 kg).  Risk Assessment: Allergies: Reviewed. She is allergic to doxycycline hyclate and sulfa antibiotics.  Allergy Precautions: None required Coagulopathies: Reviewed. None identified.  Blood-thinner therapy: None at this time Active Infection(s): Reviewed. None identified. Ms. Noori is afebrile  Site Confirmation: Ms. Ditullio was asked to confirm the procedure and laterality before marking the site Procedure  checklist: Completed Consent: Before the procedure and under the influence of no sedative(s), amnesic(s), or anxiolytics, the patient was informed of the treatment options, risks and possible complications. To fulfill our ethical and legal obligations, as recommended by the American Medical Association's Code of Ethics, I have informed the patient of my clinical impression; the nature and purpose of the treatment or procedure; the risks, benefits, and possible complications of the intervention; the alternatives, including doing nothing; the risk(s) and benefit(s) of the alternative treatment(s) or procedure(s); and the risk(s) and benefit(s) of doing nothing.  Ms. Farha was provided with information about the general risks and possible complications associated with most interventional procedures. These include, but are not limited to: failure to achieve desired goals, infection, bleeding, organ or nerve damage, allergic reactions, paralysis, and/or death.  In addition, she was informed of those risks and possible complications associated to this particular procedure, which include, but are not limited to: damage to the implant; failure to decrease pain; local, systemic, or serious CNS infections, intraspinal abscess with possible cord compression and paralysis, or life-threatening such as meningitis; bleeding; organ damage; nerve injury or damage with subsequent sensory, motor, and/or autonomic system dysfunction, resulting in transient or permanent pain, numbness, and/or weakness of one or several areas of the body; allergic reactions, either minor or major life-threatening, such as anaphylactic or anaphylactoid reactions.  Furthermore, Ms. Swanner was informed of those risks and complications associated with the medications. These include, but are not limited to: allergic reactions (i.e.: anaphylactic or anaphylactoid reactions); endorphine suppression; bradycardia and/or hypotension; water retention  and/or peripheral vascular relaxation leading to lower extremity edema and possible stasis ulcers; respiratory depression and/or shortness of breath; decreased metabolic rate leading to weight gain; swelling or edema; medication-induced neural toxicity; particulate matter embolism and blood vessel occlusion with resultant organ, and/or nervous system infarction; and/or intrathecal granuloma formation with possible spinal cord compression and permanent paralysis.  Before refilling the pump Ms. Rotundo was informed that some of the medications used in the devise may not be FDA approved for such use and therefore it constitutes an off-label use of the medications.  Finally, she was informed that Medicine is not an exact science; therefore, there is also the possibility of unforeseen or unpredictable risks and/or possible complications that may result in a catastrophic outcome. The patient indicated having understood very clearly. We have given the patient no guarantees and we have made no promises. Enough time was given to the patient to ask questions, all of which were answered to the patient's satisfaction. Ms. Wahab has indicated that she wanted to continue with the procedure. Attestation: I, the ordering provider, attest that I have discussed with the patient the benefits, risks, side-effects, alternatives, likelihood of achieving goals, and potential problems during recovery for the procedure that I have provided informed consent. Date  Time: {CHL [facility]-PAIN TIME CHOICES:21018001}  Pre-Procedure Preparation:  Monitoring: As per clinic protocol. Respiration, ETCO2, SpO2, BP, heart rate and rhythm monitor placed and checked for adequate function Safety Precautions: Patient was assessed for  positional comfort and pressure points before starting the procedure. Time-out: I initiated and conducted the "Time-out" before starting the procedure, as per protocol. The patient was asked to participate by  confirming the accuracy of the "Time Out" information. Verification of the correct person, site, and procedure were performed and confirmed by me, the nursing staff, and the patient. "Time-out" conducted as per Joint Commission's Universal Protocol (UP.01.01.01). Time:    Description of Procedure:          Position: Supine Target Area: Central-port of intrathecal pump. Approach: Anterior, 90 degree angle approach. Area Prepped: Entire Area around the pump implant. DuraPrep (Iodine Povacrylex [0.7% available iodine] and Isopropyl Alcohol, 74% w/w) Safety Precautions: Aspiration looking for blood return was conducted prior to all injections. At no point did we inject any substances, as a needle was being advanced. No attempts were made at seeking any paresthesias. Safe injection practices and needle disposal techniques used. Medications properly checked for expiration dates. SDV (single dose vial) medications used. Description of the Procedure: Protocol guidelines were followed. Two nurses trained to do implant refills were present during the entire procedure. The refill medication was checked by both healthcare providers as well as the patient. The patient was included in the "Time-out" to verify the medication. The patient was placed in position. The pump was identified. The area was prepped in the usual manner. The sterile template was positioned over the pump, making sure the side-port location matched that of the pump. Both, the pump and the template were held for stability. The needle provided in the Medtronic Kit was then introduced thru the center of the template and into the central port. The pump content was aspirated and discarded volume documented. The new medication was slowly infused into the pump, thru the filter, making sure to avoid overpressure of the device. The needle was then removed and the area cleansed, making sure to leave some of the prepping solution back to take advantage of its  long term bactericidal properties. The pump was interrogated and programmed to reflect the correct medication, volume, and dosage. The program was printed and taken to the physician for approval. Once checked and signed by the physician, a copy was provided to the patient and another scanned into the EMR.  There were no vitals filed for this visit.  Start Time:   hrs. End Time:   hrs. Materials & Medications: Medtronic Refill Kit Medication(s): Please see chart orders for details.  Imaging Guidance:          Type of Imaging Technique: None used Indication(s): N/A Exposure Time: No patient exposure Contrast: None used. Fluoroscopic Guidance: N/A Ultrasound Guidance: N/A Interpretation: N/A  Antibiotic Prophylaxis:   Anti-infectives (From admission, onward)    None      Indication(s): None identified  Post-operative Assessment:  Post-procedure Vital Signs:  Pulse/HCG Rate:    Temp:   Resp:   BP:   SpO2:    EBL: None  Complications: No immediate post-treatment complications observed by team, or reported by patient.  Note: The patient tolerated the entire procedure well. A repeat set of vitals were taken after the procedure and the patient was kept under observation following institutional policy, for this type of procedure. Post-procedural neurological assessment was performed, showing return to baseline, prior to discharge. The patient was provided with post-procedure discharge instructions, including a section on how to identify potential problems. Should any problems arise concerning this procedure, the patient was given instructions to immediately contact us, at any  time, without hesitation. In any case, we plan to contact the patient by telephone for a follow-up status report regarding this interventional procedure.  Comments:  No additional relevant information.  Plan of Care  Orders:  No orders of the defined types were placed in this encounter.  Chronic Opioid  Analgesic:  No other oral opioid analgesics prescribed by our practice. Intrathecal PF-Hydromorphone 3.9295 mg/day + PF-Sufentanil 34.383 mcg/day. MME: Aprox. 58.7 mg/day.   Medications ordered for procedure: No orders of the defined types were placed in this encounter.  Medications administered: [name] had no medications administered during this visit.  See the medical record for exact dosing, route, and time of administration.  Follow-up plan:   No follow-ups on file.       Interventional Therapies  Risk  Complexity Considerations:   Estimated body mass index is 32.4 kg/m as calculated from the following:   Height as of this encounter: 5' 2.5" (1.588 m).   Weight as of this encounter: 180 lb (81.6 kg). Presence of intrathecal pump  DM  CAD  Pulmonary hypertension    Planned  Pending:   Palliative intrathecal pump refills    Under consideration:   Diagnostic left lumbar facet MBB #3  Possible left lumbar facet RFA #1   Completed:   Diagnostic left lumbar facet MBB x2 (12/22/2016) (07/14/2020) (100/100/100/100)   Therapeutic  Palliative (PRN) options:   Therapeutic/palliative left sided lumbar facet MBB #3          Recent Visits Date Type Provider Dept  10/26/21 Procedure visit Milinda Pointer, MD [facility]  Showing recent visits within past 90 days and meeting all other requirements Future Appointments Date Type Provider Dept  01/04/22 Appointment Milinda Pointer, MD [facility]  Showing future appointments within next 90 days and meeting all other requirements  Disposition: Discharge home  Discharge (Date  Time): 01/04/2022;   hrs.   Primary Care Physician: Iva Lento, PA-C Location: [facility] Note by: Gaspar Cola, MD Date: 01/04/2022; Time: 5:15 PM  Disclaimer:  Medicine is not an Chief Strategy Officer. The only guarantee in medicine is that nothing is guaranteed. It is important to note  that the decision to proceed with this intervention was based on the information collected from the patient. The Data and conclusions were drawn from the patient's questionnaire, the interview, and the physical examination. Because the information was provided in large part by the patient, it cannot be guaranteed that it has not been purposely or unconsciously manipulated. Every effort has been made to obtain as much relevant data as possible for this evaluation. It is important to note that the conclusions that lead to this procedure are derived in large part from the available data. Always take into account that the treatment will also be dependent on availability of resources and existing treatment guidelines, considered by other Pain Management Practitioners as being common knowledge and practice, at the time of the intervention. For Medico-Legal purposes, it is also important to point out that variation in procedural techniques and pharmacological choices are the acceptable norm. The indications, contraindications, technique, and results of the above procedure should only be interpreted and judged by a Board-Certified Interventional Pain Specialist with extensive familiarity and expertise in the same exact procedure and technique.

## 2022-01-04 ENCOUNTER — Ambulatory Visit: Payer: Medicare Other | Attending: Pain Medicine | Admitting: Pain Medicine

## 2022-01-04 ENCOUNTER — Ambulatory Visit
Admission: RE | Admit: 2022-01-04 | Discharge: 2022-01-04 | Disposition: A | Discharge status: Home / Self Care | Payer: Medicare Other | Source: Ambulatory Visit | Attending: Pain Medicine | Admitting: Pain Medicine

## 2022-01-04 ENCOUNTER — Encounter: Payer: Self-pay | Admitting: Pain Medicine

## 2022-01-04 VITALS — BP 160/78 | HR 78 | Temp 97.0°F | Resp 17 | Ht 62.5 in | Wt 180.0 lb

## 2022-01-04 DIAGNOSIS — Z79899 Other long term (current) drug therapy: Secondary | ICD-10-CM

## 2022-01-04 DIAGNOSIS — T402X5A Adverse effect of other opioids, initial encounter: Secondary | ICD-10-CM

## 2022-01-04 DIAGNOSIS — M51379 Other intervertebral disc degeneration, lumbosacral region without mention of lumbar back pain or lower extremity pain: Secondary | ICD-10-CM

## 2022-01-04 DIAGNOSIS — M961 Postlaminectomy syndrome, not elsewhere classified: Secondary | ICD-10-CM

## 2022-01-04 DIAGNOSIS — G894 Chronic pain syndrome: Secondary | ICD-10-CM

## 2022-01-04 DIAGNOSIS — M47816 Spondylosis without myelopathy or radiculopathy, lumbar region: Secondary | ICD-10-CM

## 2022-01-04 DIAGNOSIS — M5410 Radiculopathy, site unspecified: Secondary | ICD-10-CM | POA: Insufficient documentation

## 2022-01-04 DIAGNOSIS — Z978 Presence of other specified devices: Secondary | ICD-10-CM | POA: Diagnosis present

## 2022-01-04 DIAGNOSIS — Z79891 Long term (current) use of opiate analgesic: Secondary | ICD-10-CM | POA: Diagnosis present

## 2022-01-04 DIAGNOSIS — Z5189 Encounter for other specified aftercare: Secondary | ICD-10-CM

## 2022-01-04 DIAGNOSIS — Z451 Encounter for adjustment and management of infusion pump: Secondary | ICD-10-CM | POA: Diagnosis present

## 2022-01-04 DIAGNOSIS — M5441 Lumbago with sciatica, right side: Secondary | ICD-10-CM | POA: Diagnosis present

## 2022-01-04 DIAGNOSIS — M79604 Pain in right leg: Secondary | ICD-10-CM | POA: Diagnosis present

## 2022-01-04 DIAGNOSIS — M5137 Other intervertebral disc degeneration, lumbosacral region: Secondary | ICD-10-CM | POA: Diagnosis present

## 2022-01-04 DIAGNOSIS — G8929 Other chronic pain: Secondary | ICD-10-CM | POA: Insufficient documentation

## 2022-01-04 DIAGNOSIS — K5903 Drug induced constipation: Secondary | ICD-10-CM | POA: Insufficient documentation

## 2022-01-04 MED ORDER — TRIAMCINOLONE ACETONIDE 40 MG/ML IJ SUSP
INTRAMUSCULAR | Status: AC
  Filled 2022-01-04: qty 1

## 2022-01-04 MED ORDER — SODIUM CHLORIDE 0.9% FLUSH
2.0000 mL | Freq: Once | INTRAVENOUS | Status: AC
  Administered 2022-01-04: 2 mL

## 2022-01-04 MED ORDER — PENTAFLUOROPROP-TETRAFLUOROETH EX AERO: INHALATION_SPRAY | Freq: Once | CUTANEOUS | Status: DC

## 2022-01-04 MED ORDER — SODIUM CHLORIDE (PF) 0.9 % IJ SOLN
INTRAMUSCULAR | Status: AC
  Filled 2022-01-04: qty 10

## 2022-01-04 MED ORDER — TRIAMCINOLONE ACETONIDE 40 MG/ML IJ SUSP
40.0000 mg | Freq: Once | INTRAMUSCULAR | Status: AC
  Administered 2022-01-04: 40 mg

## 2022-01-04 MED ORDER — IOHEXOL 180 MG/ML  SOLN
INTRAMUSCULAR | Status: AC
  Filled 2022-01-04: qty 20

## 2022-01-04 MED ORDER — LIDOCAINE HCL 2 % IJ SOLN
INTRAMUSCULAR | Status: AC
  Filled 2022-01-04: qty 20

## 2022-01-04 MED ORDER — ROPIVACAINE HCL 2 MG/ML IJ SOLN
INTRAMUSCULAR | Status: AC
  Filled 2022-01-04: qty 20

## 2022-01-04 MED ORDER — LIDOCAINE HCL 2 % IJ SOLN
20.0000 mL | Freq: Once | INTRAMUSCULAR | Status: AC
  Administered 2022-01-04: 400 mg

## 2022-01-04 MED ORDER — IOHEXOL 180 MG/ML  SOLN
10.0000 mL | Freq: Once | INTRAMUSCULAR | Status: AC
  Administered 2022-01-04: 10 mL via EPIDURAL

## 2022-01-04 MED ORDER — ROPIVACAINE HCL 2 MG/ML IJ SOLN
2.0000 mL | Freq: Once | INTRAMUSCULAR | Status: AC
  Administered 2022-01-04: 2 mL via EPIDURAL

## 2022-01-04 NOTE — Progress Notes (Signed)
PROVIDER NOTE: Interpretation of information contained herein should be left to medically-trained personnel. Specific patient instructions are provided elsewhere under "Patient Instructions" section of medical record. This document was created in part using STT-dictation technology, any transcriptional errors that may result from this process are unintentional.  Patient: Tiffany Hunter Type: Established DOB: 06-03-1945 MRN: [id number] PCP: Iva Lento, PA-C  Service: Procedure DOS: 01/04/2022 Setting: Ambulatory Location: Ambulatory outpatient facility Delivery: Face-to-face Provider: Gaspar Cola, MD Specialty: Interventional Pain Management Specialty designation: 09 Location: Outpatient facility Ref. Prov.: Iva Lento, PA-C    Interventional Therapy      Procedure: Caudal Epidural Steroid Injection #1  Laterality: Midline aiming right Level: Sacrococcygeal ligament  Imaging: Fluoroscopy-guided         Anesthesia: Local anesthesia (1-2% Lidocaine) Anxiolysis: None                 Sedation: No Sedation              .  DOS: 01/04/2022  Performed by: Gaspar Cola, MD  Purpose: Diagnostic/Therapeutic Indications: Low back and lower extremity pain severe enough to impact quality of life or function. Rationale (medical necessity): procedure needed and proper for the diagnosis and/or treatment of Ms. Profeta's medical symptoms and needs. 1. 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 Chronic lower extremity pain (Right)   3. 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 DDD (degenerative disc disease), lumbosacral    NAS-11 Pain score:   Pre-procedure: 8 /10   Post-procedure: 0-No pain/10     Target: Lumbosacral epidural canal  Location: Epidural space  Region: Caudal canal Approach: Percutaneous  Type of procedure: Epidural block  Position  Prep  Materials:  Position:  Prone  Prep solution: DuraPrep (Iodine Povacrylex [0.7% available iodine] and Isopropyl Alcohol, 74% w/w) Prep Area: Entire posterior lumbosacral area  Materials:  Tray: Epidural Needle(s):  Type: Epidural  Gauge (G): 17  Length: 3.5-in  Qty: 1  Pre-op H&P Assessment:  Ms. Kiddy is a 76 y.o. (year old), female patient, seen today for interventional treatment. She  has a past surgical history that includes Carpal tunnel release (Bilateral); Back surgery; Breast surgery (Bilateral); Cholecystectomy; Colonoscopy w/ polypectomy; Cataract extraction w/ intraocular lens implant (Right); Abdominal hysterectomy; Fracture surgery (Right); Cardiac catheterization (2012); and Pain pump revision (Left, 07/07/2017). Ms. Thune has a current medication list which includes the following prescription(s): atorvastatin, carvedilol, celecoxib, diclofenac sodium, losartan, meloxicam, NON FORMULARY, omeprazole, oxybutynin, ssd, albuterol, and naloxegol oxalate, and the following Facility-Administered Medications: pentafluoroprop-tetrafluoroeth. Her primarily concern today is the Leg Pain  Note: The patient had come in today for her routine intrathecal pump refill.  However, during the patient's evaluation, she indicated having pain running down the right lower extremity through the back of the leg and into the bottom of her foot and what appears to be a right-sided S1 radiculitis.  She does have a prior history of a failed back surgical syndrome.  During the evaluation I informed the patient of my findings and I recommended to schedule her for a right-sided caudal epidural steroid injection.  The patient has requested that in addition to her pump refilled today, that we go ahead and do the caudal epidural steroid injection today as well.  After checking with her insurance, it would appear that she does not need preapproval and therefore we have informed her that we could go ahead and do that  procedure today with the  understanding that since she is not n.p.o., we will not be doing it with any type of sedation or anxiolytics.  She indicated that this would be fine.  Today we will proceed with her caudal epidural steroid injection as an add-on.  Initial Vital Signs:  Pulse/HCG Rate: 78ECG Heart Rate: 94 Temp:  (!) 97 F (36.1 C) Resp: 16 BP:  (!) 147/66 SpO2: 96 %  BMI: Estimated body mass index is 32.4 kg/m as calculated from the following:   Height as of this encounter: 5' 2.5" (1.588 m).   Weight as of this encounter: 180 lb (81.6 kg).  Risk Assessment: Allergies: Reviewed. She is allergic to doxycycline hyclate and sulfa antibiotics.  Allergy Precautions: None required Coagulopathies: Reviewed. None identified.  Blood-thinner therapy: None at this time Active Infection(s): Reviewed. None identified. Ms. Blandino is afebrile  Site Confirmation: Ms. Ousley was asked to confirm the procedure and laterality before marking the site Procedure checklist: Completed Consent: Before the procedure and under the influence of no sedative(s), amnesic(s), or anxiolytics, the patient was informed of the treatment options, risks and possible complications. To fulfill our ethical and legal obligations, as recommended by the American Medical Association's Code of Ethics, I have informed the patient of my clinical impression; the nature and purpose of the treatment or procedure; the risks, benefits, and possible complications of the intervention; the alternatives, including doing nothing; the risk(s) and benefit(s) of the alternative treatment(s) or procedure(s); and the risk(s) and benefit(s) of doing nothing. The patient was provided information about the general risks and possible complications associated with the procedure. These may include, but are not limited to: failure to achieve desired goals, infection, bleeding, organ or nerve damage, allergic reactions, paralysis, and death. In addition, the patient was  informed of those risks and complications associated to Spine-related procedures, such as failure to decrease pain; infection (i.e.: Meningitis, epidural or intraspinal abscess); bleeding (i.e.: epidural hematoma, subarachnoid hemorrhage, or any other type of intraspinal or peri-dural bleeding); organ or nerve damage (i.e.: Any type of peripheral nerve, nerve root, or spinal cord injury) with subsequent damage to sensory, motor, and/or autonomic systems, resulting in permanent pain, numbness, and/or weakness of one or several areas of the body; allergic reactions; (i.e.: anaphylactic reaction); and/or death. Furthermore, the patient was informed of those risks and complications associated with the medications. These include, but are not limited to: allergic reactions (i.e.: anaphylactic or anaphylactoid reaction(s)); adrenal axis suppression; blood sugar elevation that in diabetics may result in ketoacidosis or comma; water retention that in patients with history of congestive heart failure may result in shortness of breath, pulmonary edema, and decompensation with resultant heart failure; weight gain; swelling or edema; medication-induced neural toxicity; particulate matter embolism and blood vessel occlusion with resultant organ, and/or nervous system infarction; and/or aseptic necrosis of one or more joints. Finally, the patient was informed that Medicine is not an exact science; therefore, there is also the possibility of unforeseen or unpredictable risks and/or possible complications that may result in a catastrophic outcome. The patient indicated having understood very clearly. We have given the patient no guarantees and we have made no promises. Enough time was given to the patient to ask questions, all of which were answered to the patient's satisfaction. Ms. Leone has indicated that she wanted to continue with the procedure. Attestation: I, the ordering provider, attest that I have discussed with the  patient the benefits, risks, side-effects, alternatives, likelihood of achieving goals, and potential  problems during recovery for the procedure that I have provided informed consent. Date  Time: 01/04/2022 12:55 PM  Imaging Guidance (Spinal):          Type of Imaging Technique: Fluoroscopy Guidance (Spinal) Indication(s): Assistance in needle guidance and placement for procedures requiring needle placement in or near specific anatomical locations not easily accessible without such assistance. Exposure Time: Please see nurses notes. Contrast: Before injecting any contrast, we confirmed that the patient did not have an allergy to iodine, shellfish, or radiological contrast. Once satisfactory needle placement was completed at the desired level, radiological contrast was injected. Contrast injected under live fluoroscopy. No contrast complications. See chart for type and volume of contrast used. Fluoroscopic Guidance: I was personally present during the use of fluoroscopy. "Tunnel Vision Technique" used to obtain the best possible view of the target area. Parallax error corrected before commencing the procedure. "Direction-depth-direction" technique used to introduce the needle under continuous pulsed fluoroscopy. Once target was reached, antero-posterior, oblique, and lateral fluoroscopic projection used confirm needle placement in all planes. Images permanently stored in EMR. Interpretation: I personally interpreted the imaging intraoperatively. Adequate needle placement confirmed in multiple planes. Appropriate spread of contrast into desired area was observed. No evidence of afferent or efferent intravascular uptake. No intrathecal or subarachnoid spread observed. Permanent images saved into the patient's record.  Pre-Procedure Preparation:  Monitoring: As per clinic protocol. Respiration, ETCO2, SpO2, BP, heart rate and rhythm monitor placed and checked for adequate function Safety Precautions:  Patient was assessed for positional comfort and pressure points before starting the procedure. Time-out: I initiated and conducted the "Time-out" before starting the procedure, as per protocol. The patient was asked to participate by confirming the accuracy of the "Time Out" information. Verification of the correct person, site, and procedure were performed and confirmed by me, the nursing staff, and the patient. "Time-out" conducted as per Joint Commission's Universal Protocol (UP.01.01.01). Time: 1341  Description  Narrative of Procedure:          Procedural Technique Safety Precautions: Aspiration looking for blood return was conducted prior to all injections. At no point did we inject any substances, as a needle was being advanced. No attempts were made at seeking any paresthesias. Safe injection practices and needle disposal techniques used. Medications properly checked for expiration dates. SDV (single dose vial) medications used. Description of the Procedure: Protocol guidelines were followed. The patient was assisted into a comfortable position. The target area was identified and the area prepped in the usual manner. Skin & deeper tissues infiltrated with local anesthetic. Appropriate amount of time allowed to pass for local anesthetics to take effect. The procedure needles were then advanced to the target area. Proper needle placement secured. Negative aspiration confirmed. Solution injected in intermittent fashion, asking for systemic symptoms every 0.5cc of injectate. The needles were then removed and the area cleansed, making sure to leave some of the prepping solution back to take advantage of its long term bactericidal properties.  Technical description of procedure:  Safety Precautions: Aspiration looking for blood return was conducted prior to all injections. At no point did we inject any substances, as a needle was being advanced. No attempts were made at seeking any paresthesias. Safe  injection practices and needle disposal techniques used. Medications properly checked for expiration dates. SDV (single dose vial) medications used. Description of the Procedure: Protocol guidelines were followed. The patient was placed in position over the fluoroscopy table. The target area was identified and the area prepped in the  usual manner. Skin & deeper tissues infiltrated with local anesthetic. Appropriate amount of time allowed to pass for local anesthetics to take effect. The procedure needles were then advanced to the target area. Proper needle placement secured. Negative aspiration confirmed. Solution injected in intermittent fashion, asking for systemic symptoms every 0.5cc of injectate. The needles were then removed and the area cleansed, making sure to leave some of the prepping solution back to take advantage of its long term bactericidal properties.  Vitals:   01/04/22 1300 01/04/22 1343 01/04/22 1347 01/04/22 1359  BP: (!) 183/154 (!) 206/123 (!) 210/114 (!) 160/78  Pulse:      Resp: 18 (!) 21 17   Temp:      SpO2: 95% 96% 95%   Weight:      Height:         Start Time: 1341 hrs. End Time: 1348 hrs.  Post-operative Assessment:  Post-procedure Vital Signs:  Pulse/HCG Rate: 7889 Temp:  (!) 97 F (36.1 C) Resp: 17 BP:  (!) 160/78 (sitting) SpO2: 95 %  EBL: None  Complications: No immediate post-treatment complications observed by team, or reported by patient.  Note: The patient tolerated the entire procedure well. A repeat set of vitals were taken after the procedure and the patient was kept under observation following institutional policy, for this type of procedure. Post-procedural neurological assessment was performed, showing return to baseline, prior to discharge. The patient was provided with post-procedure discharge instructions, including a section on how to identify potential problems. Should any problems arise concerning this procedure, the patient was given  instructions to immediately contact us, at any time, without hesitation. In any case, we plan to contact the patient by telephone for a follow-up status report regarding this interventional procedure.  Comments:  No additional relevant information.  Plan of Care  Orders:  Orders Placed This Encounter  Procedures   PUMP REFILL    Maintain Protocol by having two(2) healthcare providers during procedure and programming.    Scheduling Instructions:     Please refill intrathecal pump today.    Order Specific Question:   Where will this procedure be performed?    Answer:   [facility]   PUMP REFILL    Whenever possible schedule on a procedure today.    Standing Status:   Future    Standing Expiration Date:   05/06/2022    Scheduling Instructions:     Please schedule intrathecal pump refill based on pump programming. Avoid schedule intervals of more than 120 days (4 months).    Order Specific Question:   Where will this procedure be performed?    Answer:   [facility]   Caudal Epidural Injection    Scheduling Instructions:     Laterality: Right-sided     Level(s): Sacrococcygeal canal (Tailbone area)     Sedation: No Sedation     Timeframe: Today    Order Specific Question:   Where will this procedure be performed?    Answer:   [facility]   DG PAIN CLIN[facility] C-ARM 1-60 MIN NO REPORT    Intraoperative interpretation by procedural physician at [place].    Standing Status:   Standing    Number of Occurrences:   1    Order Specific Question:   Reason for exam:    Answer:   Assistance in needle guidance and placement for procedures requiring needle placement in or near specific anatomical locations not easily accessible without such assistance.  Informed Consent Details: Physician/Practitioner Attestation; Transcribe to consent form and obtain patient signature    Transcribe to consent form and obtain patient signature.    Order Specific Question:    Physician/Practitioner attestation of informed consent for procedure/surgical case    Answer:   I, the physician/practitioner, attest that I have discussed with the patient the benefits, risks, side effects, alternatives, likelihood of achieving goals and potential problems during recovery for the procedure that I have provided informed consent.    Order Specific Question:   Procedure    Answer:   Intrathecal pump refill    Order Specific Question:   Physician/Practitioner performing the procedure    Answer:   Attending Physician: Kathlen Brunswick. Dossie Arbour, MD & designated trained staff    Order Specific Question:   Indication/Reason    Answer:   Chronic Pain Syndrome (G89.4), presence of an intrathecal pump (Z97.8)   Provide equipment / supplies at bedside    "Epidural Tray" (Disposable  single use) Catheter: NOT required    Standing Status:   Standing    Number of Occurrences:   1    Order Specific Question:   Specify    Answer:   Epidural Tray   Informed Consent Details: Physician/Practitioner Attestation; Transcribe to consent form and obtain patient signature    Nursing Order: Transcribe to consent form and obtain patient signature. Note: Always confirm laterality of pain with Ms. Stillings, before procedure.    Order Specific Question:   Physician/Practitioner attestation of informed consent for procedure/surgical case    Answer:   I, the physician/practitioner, attest that I have discussed with the patient the benefits, risks, side effects, alternatives, likelihood of achieving goals and potential problems during recovery for the procedure that I have provided informed consent.    Order Specific Question:   Procedure    Answer:   Caudal epidural steroid injection    Order Specific Question:   Physician/Practitioner performing the procedure    Answer:   Malike Foglio A. Dossie Arbour, MD    Order Specific Question:   Indication/Reason    Answer:   Low back pain and lower extremity pain secondary to  lumbosacral radiculitis   Chronic Opioid Analgesic:  No other oral opioid analgesics prescribed by our practice. Intrathecal PF-Hydromorphone 3.9295 mg/day + PF-Sufentanil 34.383 mcg/day. MME: Aprox. 58.7 mg/day.   Medications ordered for procedure: Meds ordered this encounter  Medications   iohexol (OMNIPAQUE) 180 MG/ML injection 10 mL    Must be Myelogram-compatible. If not available, you may substitute with a water-soluble, non-ionic, hypoallergenic, myelogram-compatible radiological contrast medium.   lidocaine (XYLOCAINE) 2 % (with pres) injection 400 mg   pentafluoroprop-tetrafluoroeth (GEBAUERS) aerosol   triamcinolone acetonide (KENALOG-40) injection 40 mg   sodium chloride flush (NS) 0.9 % injection 2 mL   ropivacaine (PF) 2 mg/mL (0.2%) (NAROPIN) injection 2 mL   Medications administered: We administered iohexol, lidocaine, triamcinolone acetonide, sodium chloride flush, and ropivacaine (PF) 2 mg/mL (0.2%).  See the medical record for exact dosing, route, and time of administration.  Follow-up plan:   Return in 2 weeks (on 01/18/2022) for Proc-day (T,Th), (VV), (PPE).       Interventional Therapies  Risk  Complexity Considerations:   Estimated body mass index is 32.4 kg/m as calculated from the following:   Height as of this encounter: 5' 2.5" (1.588 m).   Weight as of this encounter: 180 lb (81.6 kg). Presence of intrathecal pump  DM  CAD  Pulmonary hypertension    Planned  Pending:  Diagnostic/therapeutic right caudal ESI #1 (01/04/2022)  Palliative intrathecal pump refills    Under consideration:   Diagnostic left lumbar facet MBB #3  Possible left lumbar facet RFA #1   Completed:   Diagnostic left lumbar facet MBB x2 (12/22/2016) (07/14/2020) (100/100/100/100)   Therapeutic  Palliative (PRN) options:   Therapeutic/palliative left sided lumbar facet MBB #3      Recent Visits Date Type Provider Dept  10/26/21 Procedure visit Milinda Pointer, MD [facility]  Showing recent visits within past 90 days and meeting all other requirements Today's Visits Date Type Provider Dept  01/04/22 Procedure visit Milinda Pointer, MD [facility]  Showing today's visits and meeting all other requirements Future Appointments Date Type Provider Dept  01/18/22 Appointment Milinda Pointer, [facility]  03/08/22 Appointment Milinda Pointer, MD [facility]  Showing future appointments within next 90 days and meeting all other requirements  Disposition: Discharge home  Discharge (Date  Time): 01/04/2022; 1359 hrs.   Primary Care Physician: Iva Lento, PA-C Location: [facility] Note by: Gaspar Cola, MD Date: 01/04/2022; Time: 2:08 PM  Disclaimer:  Medicine is not an Chief Strategy Officer. The only guarantee in medicine is that nothing is guaranteed. It is important to note that the decision to proceed with this intervention was based on the information collected from the patient. The Data and conclusions were drawn from the patient's questionnaire, the interview, and the physical examination. Because the information was provided in large part by the patient, it cannot be guaranteed that it has not been purposely or unconsciously manipulated. Every effort has been made to obtain as much relevant data as possible for this evaluation. It is important to note that the conclusions that lead to this procedure are derived in large part from the available data. Always take into account that the treatment will also be dependent on availability of resources and existing treatment guidelines, considered by other Pain Management Practitioners as being common knowledge and practice, at the time of the intervention. For Medico-Legal purposes, it is also important to point out that variation in procedural techniques and pharmacological choices are the acceptable norm. The indications,  contraindications, technique, and results of the above procedure should only be interpreted and judged by a Board-Certified Interventional Pain Specialist with extensive familiarity and expertise in the same exact procedure and technique.

## 2022-01-04 NOTE — Patient Instructions (Addendum)
____________________________________________________________________________________________  Virtual Visits   What is a "Virtual Visit"? It is a healthcare communication encounter (medical visit) that takes place on real time (NOT TEXT or E-MAIL) over the telephone or computer device (desktop, laptop, tablet, smart phone, etc.). It allows for more location flexibility between the patient and the healthcare provider.  Who decides when these types of visits will be used? The physician.  Who is eligible for these types of visits? Only those patients that can be reliably reached over the telephone.  What do you mean by reliably? We do not have time to call everyone multiple times, therefore those that tend to screen calls and then call back later are not suitable candidates for this system. We understand how people are reluctant to pickup on "unknown" calls, therefore, we suggest adding our telephone numbers to your list of "CONTACT(s)". This way, you should be able to readily identify our calls when you receive one. All of our numbers are available below.   Who is not eligible? This option is not available for medication management encounters, specially for controlled substances. Patients on pain medications that fall under the category of controlled substances have to come in for "Face-to-Face" encounters. This is required for mandatory monitoring of these substances. You may be asked to provide a sample for an unannounced urine drug screening test (UDS), and we will need to count your pain pills. Not bringing your pills to be counted may result in no refill. Obviously, neither one of these can be done over the phone.  When will this type of visits be used? You can request a virtual visit whenever you are physically unable to attend a regular appointment. The decision will be made by the physician (or healthcare provider) on a case by case basis.   At what time will I be called? This is an  excellent question. The providers will try to call you whenever they have time available. Do not expect to be called at any specific time. The secretaries will assign you a time for your virtual visit appointment, but this is done simply to keep a list of those patients that need to be called, but not for the purpose of keeping a time schedule. Be advised that the call may come in anytime during the day, between the hours of 8:00 AM and 8::00 PM, depending on provider availability. We do understand that the system is not perfect. If you are unable to be available that day on a moments notice, then request an "in-person" appointment rather than a "virtual visit".  Can I request my medication visits to be "Virtual"? Yes you may request it, but the decision is entirely up to the healthcare provider. Control substances require specific monitoring that requires Face-to-Face encounters. The number of encounters  and the extent of the monitoring is determined on a case by case basis.  Add a new contact to your smart phone and label it "PAIN CLINIC" Under this contact add the following numbers: Main: [phone] (Official Contact Number) Nurses: [phone] (These are outgoing only calling systems. Do not call this number.) Dr. Garth Diffley: [phone] or [phone] (Outgoing calls only. Do not call this number.)  ____________________________________________________________________________________________    ____________________________________________________________________________________________  Post-Procedure Discharge Instructions  Instructions: Apply ice:  Purpose: This will minimize any swelling and discomfort after procedure.  When: Day of procedure, as soon as you get home. How: Fill a plastic sandwich bag with crushed ice. Cover it with a small towel and apply   to injection site. How long: (15 min on, 15 min off) Apply for 15 minutes then remove x 15 minutes.  Repeat sequence on  day of procedure, until you go to bed. Apply heat:  Purpose: To treat any soreness and discomfort from the procedure. When: Starting the next day after the procedure. How: Apply heat to procedure site starting the day following the procedure. How long: May continue to repeat daily, until discomfort goes away. Food intake: Start with clear liquids (like water) and advance to regular food, as tolerated.  Physical activities: Keep activities to a minimum for the first 8 hours after the procedure. After that, then as tolerated. Driving: If you have received any sedation, be responsible and do not drive. You are not allowed to drive for 24 hours after having sedation. Blood thinner: (Applies only to those taking blood thinners) You may restart your blood thinner 6 hours after your procedure. Insulin: (Applies only to Diabetic patients taking insulin) As soon as you can eat, you may resume your normal dosing schedule. Infection prevention: Keep procedure site clean and dry. Shower daily and clean area with soap and water. Post-procedure Pain Diary: Extremely important that this be done correctly and accurately. Recorded information will be used to determine the next step in treatment. For the purpose of accuracy, follow these rules: Evaluate only the area treated. Do not report or include pain from an untreated area. For the purpose of this evaluation, ignore all other areas of pain, except for the treated area. After your procedure, avoid taking a long nap and attempting to complete the pain diary after you wake up. Instead, set your alarm clock to go off every hour, on the hour, for the initial 8 hours after the procedure. Document the duration of the numbing medicine, and the relief you are getting from it. Do not go to sleep and attempt to complete it later. It will not be accurate. If you received sedation, it is likely that you were given a medication that may cause amnesia. Because of this,  completing the diary at a later time may cause the information to be inaccurate. This information is needed to plan your care. Follow-up appointment: Keep your post-procedure follow-up evaluation appointment after the procedure (usually 2 weeks for most procedures, 6 weeks for radiofrequencies). DO NOT FORGET to bring you pain diary with you.   Expect: (What should I expect to see with my procedure?) From numbing medicine (AKA: Local Anesthetics): Numbness or decrease in pain. You may also experience some weakness, which if present, could last for the duration of the local anesthetic. Onset: Full effect within 15 minutes of injected. Duration: It will depend on the type of local anesthetic used. On the average, 1 to 8 hours.  From steroids (Applies only if steroids were used): Decrease in swelling or inflammation. Once inflammation is improved, relief of the pain will follow. Onset of benefits: Depends on the amount of swelling present. The more swelling, the longer it will take for the benefits to be seen. In some cases, up to 10 days. Duration: Steroids will stay in the system x 2 weeks. Duration of benefits will depend on multiple posibilities including persistent irritating factors. Side-effects: If present, they may typically last 2 weeks (the duration of the steroids). Frequent: Cramps (if they occur, drink Gatorade and take over-the-counter Magnesium 450-500 mg once to twice a day); water retention with temporary weight gain; increases in blood sugar; decreased immune system response; increased appetite. Occasional: Facial flushing (  red, warm cheeks); mood swings; menstrual changes. Uncommon: Long-term decrease or suppression of natural hormones; bone thinning. (These are more common with higher doses or more frequent use. This is why we prefer that our patients avoid having any injection therapies in other practices.)  Very Rare: Severe mood changes; psychosis; aseptic necrosis. From  procedure: Some discomfort is to be expected once the numbing medicine wears off. This should be minimal if ice and heat are applied as instructed.  Call if: (When should I call?) You experience numbness and weakness that gets worse with time, as opposed to wearing off. New onset bowel or bladder incontinence. (Applies only to procedures done in the spine)  Emergency Numbers: Durning business hours (Monday - Thursday, 8:00 AM - 4:00 PM) (Friday, 9:00 AM - 12:00 Noon): [phone] After hours: [phone] NOTE: If you are having a problem and are unable connect with, or to talk to a provider, then go to your nearest urgent care or emergency department. If the problem is serious and urgent, please call 911. ____________________________________________________________________________________________    

## 2022-01-04 NOTE — Progress Notes (Signed)
Safety precautions to be maintained throughout the outpatient stay will include: orient to surroundings, keep bed in low position, maintain call bell within reach at all times, provide assistance with transfer out of bed and ambulation.  

## 2022-01-05 ENCOUNTER — Telehealth: Payer: Self-pay

## 2022-01-05 MED FILL — Medication: INTRATHECAL | Qty: 1 | Status: AC

## 2022-01-05 NOTE — Telephone Encounter (Signed)
Post procedure phone call. Patient states she is doing good.  

## 2022-01-18 ENCOUNTER — Ambulatory Visit: Payer: Medicare Other | Attending: Pain Medicine | Admitting: Pain Medicine

## 2022-01-18 DIAGNOSIS — M5441 Lumbago with sciatica, right side: Secondary | ICD-10-CM | POA: Diagnosis not present

## 2022-01-18 DIAGNOSIS — M5410 Radiculopathy, site unspecified: Secondary | ICD-10-CM

## 2022-01-18 DIAGNOSIS — G8929 Other chronic pain: Secondary | ICD-10-CM

## 2022-01-18 DIAGNOSIS — M961 Postlaminectomy syndrome, not elsewhere classified: Secondary | ICD-10-CM

## 2022-01-18 DIAGNOSIS — Z978 Presence of other specified devices: Secondary | ICD-10-CM

## 2022-01-18 DIAGNOSIS — M79604 Pain in right leg: Secondary | ICD-10-CM | POA: Diagnosis not present

## 2022-01-18 DIAGNOSIS — M47816 Spondylosis without myelopathy or radiculopathy, lumbar region: Secondary | ICD-10-CM

## 2022-01-18 NOTE — Progress Notes (Signed)
Patient: Tiffany Hunter  Service Category: E/M  Provider: Gaspar Cola, MD  DOB: 12-Dec-1945  DOS: 01/18/2022  Location: Office  MRN: [id number]  Setting: Ambulatory outpatient  Referring Provider: Iva Lento, PA-C  Type: Established Patient  Specialty: Interventional Pain Management  PCP: Iva Lento, PA-C  Location: Remote location  Delivery: TeleHealth     Virtual Encounter - Pain Management PROVIDER NOTE: Information contained herein reflects review and annotations entered in association with encounter. Interpretation of such information and data should be left to medically-trained personnel. Information provided to patient can be located elsewhere in the medical record under "Patient Instructions". Document created using STT-dictation technology, any transcriptional errors that may result from process are unintentional.    Contact & Pharmacy Preferred: [phone] Home: [phone] (home) Mobile: [phone] (mobile) E-mail: No e-mail address on record  [place], [place] - [place] [address] [place] [address] Phone: [phone] Fax: [phone]   Pre-screening  Tiffany Hunter offered "in-person" vs "virtual" encounter. She indicated preferring virtual for this encounter.   Reason COVID-19*  Social distancing based on CDC and AMA recommendations.   I contacted Tiffany Hunter on 01/18/2022 via telephone.      I clearly identified myself as Gaspar Cola, MD. I verified that I was speaking with the correct person using two identifiers (Name: Tiffany Hunter, and date of birth: 1946-03-11).  Consent I sought verbal advanced consent from Tiffany Hunter for virtual visit interactions. I informed Tiffany Hunter of possible security and privacy concerns, risks, and limitations associated with providing "not-in-person" medical evaluation and management services. I also informed Tiffany Hunter of the availability of "in-person"  appointments. Finally, I informed her that there would be a charge for the virtual visit and that she could be  personally, fully or partially, financially responsible for it. Tiffany Hunter expressed understanding and agreed to proceed.   Historic Elements   Tiffany Hunter is a 76 y.o. year old, female patient evaluated today after our last contact on 01/04/2022. 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 She also  has a past surgical history that includes Carpal tunnel release (Bilateral); Back surgery; Breast surgery (Bilateral); Cholecystectomy; Colonoscopy w/ polypectomy; Cataract extraction w/ intraocular lens implant (Right); Abdominal hysterectomy; Fracture surgery (Right); Cardiac catheterization (2012); and Pain pump revision (Left, 07/07/2017). Tiffany Hunter has a current medication list which includes the following prescription(s): atorvastatin, carvedilol, celecoxib, diclofenac sodium, losartan, meloxicam, NON FORMULARY, omeprazole, oxybutynin, ssd, albuterol, and naloxegol oxalate. She  reports that she has never smoked. She has never used smokeless tobacco. She reports that she does not drink alcohol and does not use drugs. Tiffany Hunter is allergic to doxycycline hyclate and sulfa antibiotics.   HPI  Today, she is being contacted for a post-procedure assessment.  The patient indicated having  attained 100% relief of the pain for the duration of the local anesthetic followed by an ongoing 100% relief of her lower extremity pain and leg pain.  Today she indicated that she was recently diagnosed with cirrhosis of the liver and she is concerned  on whether or not this will affect the treatment that we provide her.  I have informed the patient that I would not and that what she needs to do is make sure that she stays away from any kind of medications that would cause problems with the liver including Tylenol.  She understood and accepted.  Post-procedure evaluation   Procedure: Caudal Epidural Steroid Injection #1  Laterality: Midline aiming right Level: Sacrococcygeal ligament  Imaging: Fluoroscopy-guided         Anesthesia: Local anesthesia (1-2% Lidocaine) Anxiolysis: None                 Sedation: No Sedation              .  DOS: 01/04/2022  Performed by: Gaspar Cola, MD  Purpose: Diagnostic/Therapeutic Indications: Low back and lower extremity pain severe enough to impact quality of life or function. Rationale (medical necessity): procedure needed and proper for the diagnosis and/or treatment of Tiffany Hunter's medical symptoms and needs. 1. 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 Chronic lower extremity pain (Right)   3. 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 DDD (degenerative disc disease), lumbosacral    NAS-11 Pain score:   Pre-procedure: 8 /10   Post-procedure: 0-No pain/10      Effectiveness:  Initial hour after procedure: 100 %. Subsequent 4-6 hours post-procedure: 100 %. Analgesia past initial 6 hours: 100 %. Ongoing improvement:  Analgesic: The patient indicates having an ongoing 100% relief of the pain. Function: Tiffany Hunter reports improvement in function ROM: Tiffany Hunter reports improvement in ROM  Pharmacotherapy Assessment   Opioid Analgesic: No other oral  opioid analgesics prescribed by our practice. Intrathecal PF-Hydromorphone 3.9295 mg/day + PF-Sufentanil 34.383 mcg/day. MME: [employer]. 58.7 mg/day.   Monitoring: [place] PMP: PDMP reviewed during this encounter.       Pharmacotherapy: No side-effects or adverse reactions reported. Compliance: No problems identified. Effectiveness: Clinically acceptable. Plan: Refer to "POC". UDS:  Summary  Date Value Ref Range Status  07/30/2019 Note  Final    Comment:    ==================================================================== Compliance Drug Analysis, Ur ==================================================================== Test                             Result       Flag       Units Drug Present not Declared for Prescription Verification   Hydromorphone                  1757         UNEXPECTED ng/mg creat    Hydromorphone may be administered as a scheduled prescription    medication; it is also an expected metabolite of hydrocodone.   Naproxen                       PRESENT      UNEXPECTED Drug Absent but Declared for Prescription Verification   Salicylate                     Not Detected UNEXPECTED    Aspirin, as indicated in the declared medication list, is not always    detected even when used as directed. ==================================================================== Test                      Result  Flag   Units      Ref Range   Creatinine              182              mg/dL      >=20 ==================================================================== Declared Medications:  The flagging and interpretation on this report are based on the  following declared medications.  Unexpected results may arise from  inaccuracies in the declared medications.  **Note: The testing scope of this panel does not include small to  moderate amounts of these reported medications:  Aspirin  **Note: The testing scope of this panel does not include the  following reported medications:  Albuterol  (Ventolin HFA)  Atorvastatin (Lipitor)  Betamethasone (Lotrisone)  Carvedilol (Coreg)  Cholecalciferol  Clotrimazole (Lotrisone)  Hydrocortisone  Mirabegron (Myrbetriq)  Naloxegol (Movantik)  Oxybutynin (Ditropan)  Supplement ==================================================================== For clinical consultation, please call [phone]. ====================================================================    No results found for: "CBDTHCR", "D8THCCBX", "D9THCCBX"   Laboratory Chemistry Profile   Renal Lab Results  Component Value Date   BUN 8 07/30/2019   CREATININE 0.56 (L) 07/30/2019   BCR 14 07/30/2019   GFR 113.32 12/16/2010   GFRAA 107 07/30/2019   GFRNONAA 93 07/30/2019    Hepatic Lab Results  Component Value Date   AST 14 07/30/2019   ALT 23 10/29/2015   ALBUMIN 3.9 07/30/2019   ALKPHOS 91 07/30/2019    Electrolytes Lab Results  Component Value Date   NA 143 07/30/2019   K 3.8 07/30/2019   CL 100 07/30/2019   CALCIUM 8.7 07/30/2019   MG 1.7 07/30/2019    Bone Lab Results  Component Value Date   25OHVITD1 42 07/30/2019   25OHVITD2 1.1 07/30/2019   25OHVITD3 41 07/30/2019    Inflammation (CRP: Acute Phase) (ESR: Chronic Phase) Lab Results  Component Value Date   CRP 12 (H) 07/30/2019   ESRSEDRATE 18 07/30/2019         Note: Above Lab results reviewed.  Imaging  DG PAIN CLINIC C-ARM 1-60 MIN NO REPORT Fluoro was used, but no Radiologist interpretation will be provided.  Please refer to "NOTES" tab for provider progress note.  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  Plan of Care   Problem-specific:  No problem-specific Assessment & Plan notes found for this encounter.  Ms. Markeisha Mancias has a current medication list which includes the following long-term medication(s): carvedilol, losartan, albuterol, and naloxegol oxalate.  Pharmacotherapy (Medications Ordered): No orders of the defined types were placed in this encounter.  Orders:  No orders of the defined types were placed in this encounter.  Follow-up plan:   No follow-ups on file.     Interventional Therapies  Risk  Complexity Considerations:   Estimated body mass index is 32.4 kg/m as calculated from the following:   Height as of this encounter: 5' 2.5" (1.588 m).   Weight as of this encounter: 180 lb (81.6 kg). Presence of intrathecal pump  DM  CAD  Pulmonary hypertension    Planned  Pending:   Diagnostic/therapeutic right caudal ESI #1 (01/04/2022)  Palliative intrathecal pump refills    Under consideration:   Diagnostic left lumbar facet MBB #3  Possible  left lumbar facet RFA #1   Completed:   Diagnostic left lumbar facet MBB x2 (12/22/2016) (07/14/2020) (100/100/100/100)   Therapeutic  Palliative (PRN) options:   Therapeutic/palliative left sided lumbar facet MBB #3       Recent Visits Date Type Provider Dept  01/04/22 Procedure visit Milinda Pointer, MD [facility]  10/26/21 Procedure visit Milinda Pointer, MD [facility]  Showing recent visits within past 90 days and meeting all other requirements Today's Visits Date Type Provider Dept  01/18/22 Office Visit Milinda Pointer, MD [facility]  Showing today's visits and meeting all other requirements Future Appointments Date Type Provider Dept  03/08/22 Appointment Milinda Pointer, MD [facility]  Showing future appointments within next 90 days and meeting all other requirements  I discussed the assessment and treatment plan with the patient. The patient was provided an  opportunity to ask questions and all were answered. The patient agreed with the plan and demonstrated an understanding of the instructions.  Patient advised to call back or seek an in-person evaluation if the symptoms or condition worsens.  Duration of encounter: 15 minutes.  Note by: Gaspar Cola, MD Date: 01/18/2022; Time: 11:16 AM

## 2022-02-11 ENCOUNTER — Other Ambulatory Visit: Payer: Self-pay

## 2022-02-11 MED ORDER — PAIN MANAGEMENT IT PUMP REFILL: 1.0000 | Freq: Once | INTRATHECAL | 0 refills | Status: AC

## 2022-03-08 ENCOUNTER — Ambulatory Visit (HOSPITAL_BASED_OUTPATIENT_CLINIC_OR_DEPARTMENT_OTHER): Payer: Medicare Other | Admitting: Pain Medicine

## 2022-03-08 DIAGNOSIS — Z91199 Patient's noncompliance with other medical treatment and regimen due to unspecified reason: Secondary | ICD-10-CM

## 2022-03-08 NOTE — Progress Notes (Signed)
NO-SHOW to intrathecal pump refill (03/08/2022)

## 2022-03-09 ENCOUNTER — Ambulatory Visit: Payer: Medicare Other | Attending: Pain Medicine | Admitting: Pain Medicine

## 2022-03-09 ENCOUNTER — Encounter: Payer: Self-pay | Admitting: Pain Medicine

## 2022-03-09 VITALS — BP 142/85 | HR 77 | Temp 97.1°F | Ht 62.5 in | Wt 175.0 lb

## 2022-03-09 DIAGNOSIS — Z978 Presence of other specified devices: Secondary | ICD-10-CM | POA: Diagnosis present

## 2022-03-09 DIAGNOSIS — M961 Postlaminectomy syndrome, not elsewhere classified: Secondary | ICD-10-CM | POA: Insufficient documentation

## 2022-03-09 DIAGNOSIS — K5903 Drug induced constipation: Secondary | ICD-10-CM | POA: Insufficient documentation

## 2022-03-09 DIAGNOSIS — M5442 Lumbago with sciatica, left side: Secondary | ICD-10-CM | POA: Insufficient documentation

## 2022-03-09 DIAGNOSIS — M5137 Other intervertebral disc degeneration, lumbosacral region: Secondary | ICD-10-CM | POA: Diagnosis present

## 2022-03-09 DIAGNOSIS — M5416 Radiculopathy, lumbar region: Secondary | ICD-10-CM | POA: Insufficient documentation

## 2022-03-09 DIAGNOSIS — G894 Chronic pain syndrome: Secondary | ICD-10-CM

## 2022-03-09 DIAGNOSIS — G8929 Other chronic pain: Secondary | ICD-10-CM | POA: Diagnosis present

## 2022-03-09 DIAGNOSIS — M79605 Pain in left leg: Secondary | ICD-10-CM | POA: Insufficient documentation

## 2022-03-09 DIAGNOSIS — M47816 Spondylosis without myelopathy or radiculopathy, lumbar region: Secondary | ICD-10-CM | POA: Insufficient documentation

## 2022-03-09 DIAGNOSIS — T402X5A Adverse effect of other opioids, initial encounter: Secondary | ICD-10-CM | POA: Insufficient documentation

## 2022-03-09 DIAGNOSIS — M533 Sacrococcygeal disorders, not elsewhere classified: Secondary | ICD-10-CM | POA: Diagnosis present

## 2022-03-09 DIAGNOSIS — Z451 Encounter for adjustment and management of infusion pump: Secondary | ICD-10-CM | POA: Insufficient documentation

## 2022-03-09 DIAGNOSIS — Z79899 Other long term (current) drug therapy: Secondary | ICD-10-CM | POA: Insufficient documentation

## 2022-03-09 DIAGNOSIS — Z79891 Long term (current) use of opiate analgesic: Secondary | ICD-10-CM | POA: Diagnosis present

## 2022-03-09 NOTE — Progress Notes (Signed)
PROVIDER NOTE: Interpretation of information contained herein should be left to medically-trained personnel. Specific patient instructions are provided elsewhere under "Patient Instructions" section of medical record. This document was created in part using STT-dictation technology, any transcriptional errors that may result from this process are unintentional.  Patient: Tiffany Hunter Type: Established DOB: Aug 14, 1945 MRN: [id number] PCP: Iva Lento, PA-C  Service: Procedure DOS: 03/09/2022 Setting: Ambulatory Location: Ambulatory outpatient facility Delivery: Face-to-face Provider: Gaspar Cola, MD Specialty: Interventional Pain Management Specialty designation: 09 Location: Outpatient facility Ref. Prov.: Iva Lento, PA-C    Primary Reason for Visit: Interventional Pain Management Treatment. CC: Back Pain  Procedure:          Type: Management of Intrathecal Drug Delivery System (IDDS) - Reservoir Refill [phone]). No rate change.  Indications: 1. Chronic pain syndrome   2. 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 Lumbar facet joint syndrome (Left)   4. DDD (degenerative disc disease), lumbosacral   5. Chronic lower extremity pain (2ry area of Pain) (Left)   6. 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 Pharmacologic therapy   10. Chronic use of opiate for therapeutic purpose   11. Long term current use of opiate analgesic   12. Opioid-induced constipation (OIC)   13. 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 Encounter for adjustment or management of infusion pump   15. Encounter for chronic pain management    Pain Assessment: Self-Reported Pain Score: 8 /10             Reported level is compatible with observation.         Post-procedure evaluation   Procedure:  Caudal Epidural Steroid Injection #1  Laterality: Midline aiming right Level: Sacrococcygeal ligament  Imaging: Fluoroscopy-guided         Anesthesia: Local anesthesia (1-2% Lidocaine) Anxiolysis: None                 Sedation: No Sedation              .  DOS: 01/04/2022  Performed by: Gaspar Cola, MD  Purpose: Diagnostic/Therapeutic Indications: Low back and lower extremity pain severe enough to impact quality of life or function. Rationale (medical necessity): procedure needed and proper for the diagnosis and/or treatment of Tiffany Hunter's medical symptoms and needs. 1. 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 Chronic lower extremity pain (Right)   3. 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 DDD (degenerative disc disease), lumbosacral    NAS-11 Pain score:   Pre-procedure: 8 /10   Post-procedure: 0-No pain/10       Effectiveness:  Initial hour after procedure: 100 %. Subsequent 4-6 hours post-procedure: 100 %. Analgesia past initial 6 hours: 100 %. Ongoing improvement:  Analgesic: The patient indicated having attained a 100% relief of the pain of the right lower extremity which seems to be ongoing at this. Function: Tiffany Hunter reports improvement in function ROM: Tiffany Hunter reports improvement in ROM     Intrathecal Drug Delivery System (IDDS)  Pump Device:  Manufacturer: Medtronic Model: Synchromed II Model No.: [id number] Serial No.: [id number] H Delivery Route: Intrathecal Type: Programmable  Volume (mL): 40 mL reservoir Priming Volume: N/A  Calibration Constant: 114.0  MRI compatibility: Conditional   Implant Details:  Date: 07/07/2017 Implanter: Roderic Palau, MD Contact Information: [facility] [phone] Last Revision/Replacement: 07/07/2017 Estimated Replacement Date: Nov/2025  Implant Site: Abdominal Laterality: Left  Catheter: Manufacturer:  Medtronic Model:  Intrathecal catheter Model No.: 8731  Serial No.: n/a  Implanted Length (cm): 28.9  Catheter Volume (mL): 0.209  Tip Location (Level): T11 (Left) Canal Access Site: L2-3  Drug content:  Primary Medication Class: Opioid  Medication: PF-Hydromorphone (Dilaudid)  Concentration: 8 mg/mL   Secondary Medication Class: Opioid  Medication: PF-Sufentanil  Concentration: 70 mcg/mL   Tertiary Medication Class: none  Medication: none   PA parameters (PCA-mode):  Mode: Off (Inactive)  Programming:  Type: Simple continuous.  Medication, Concentration, Infusion Program, & Delivery Rate: For up-to-date details please see most recent scanned programming printout.   Intrathecal Pump Therapy Assessment  Manufacturer: Medtronic Synchromed Type: Programmable Volume: 40 mL reservoir MRI compatibility: Yes    Drug content:  Primary Medication Class: Opioid Primary Medication: PF-Hydromorphone (Dilaudid) (8 mg/mL)  Secondary Medication: PF-Sufentanil (70 g/mL)  Other Medication: No third    Programming:  Type: Simple continuous. See pump readout for details.           Changes:  Medication Change: None at this point Rate Change: No change in rate  Reported side-effects or adverse reactions: None reported  Effectiveness: Described as relatively effective, allowing for increase in activities of daily living (ADL) Clinically meaningful improvement in function (CMIF): Sustained CMIF goals met  Plan: Pump refill today   Pharmacotherapy Assessment   Opioid Analgesic: No other oral opioid analgesics prescribed by our practice. Intrathecal PF-Hydromorphone 3.9295 mg/day + PF-Sufentanil 34.383 mcg/day. MME: Aprox. 58.7 mg/day.   Monitoring: [place] PMP: PDMP reviewed during this encounter.       Pharmacotherapy: No side-effects or adverse reactions reported. Compliance: No problems identified. Effectiveness: Clinically acceptable. Plan: Refer to "POC". UDS:  Summary   Date Value Ref Range Status  07/30/2019 Note  Final    Comment:    ==================================================================== Compliance Drug Analysis, Ur ==================================================================== Test                             Result       Flag       Units Drug Present not Declared for Prescription Verification   Hydromorphone                  1757         UNEXPECTED ng/mg creat    Hydromorphone may be administered as a scheduled prescription    medication; it is also an expected metabolite of hydrocodone.   Naproxen                       PRESENT      UNEXPECTED Drug Absent but Declared for Prescription Verification   Salicylate                     Not Detected UNEXPECTED    Aspirin, as indicated in the declared medication list, is not always    detected even when used as directed. ==================================================================== Test                      Result    Flag   Units      Ref Range   Creatinine  182              mg/dL      >=20 ==================================================================== Declared Medications:  The flagging and interpretation on this report are based on the  following declared medications.  Unexpected results may arise from  inaccuracies in the declared medications.  **Note: The testing scope of this panel does not include small to  moderate amounts of these reported medications:  Aspirin  **Note: The testing scope of this panel does not include the  following reported medications:  Albuterol (Ventolin HFA)  Atorvastatin (Lipitor)  Betamethasone (Lotrisone)  Carvedilol (Coreg)  Cholecalciferol  Clotrimazole (Lotrisone)  Hydrocortisone  Mirabegron (Myrbetriq)  Naloxegol (Movantik)  Oxybutynin (Ditropan)  Supplement ==================================================================== For clinical consultation, please call [phone]. ====================================================================    No results found for: "CBDTHCR", "D8THCCBX", "D9THCCBX"   Pre-op H&P Assessment:  Ms. Zulauf is a 76 y.o. (year old), female patient, seen today for interventional treatment. She  has a past surgical history that includes Carpal tunnel release (Bilateral); Back surgery; Breast surgery (Bilateral); Cholecystectomy; Colonoscopy w/ polypectomy; Cataract extraction w/ intraocular lens implant (Right); Abdominal hysterectomy; Fracture surgery (Right); Cardiac catheterization (2012); and Pain pump revision (Left, 07/07/2017). Ms. Kobel has a current medication list which includes the following prescription(s): atorvastatin, carvedilol, celecoxib, diclofenac sodium, losartan, meloxicam, NON FORMULARY, omeprazole, oxybutynin, ssd, albuterol, and naloxegol oxalate. Her primarily concern today is the Back Pain  Initial Vital Signs:  Pulse/HCG Rate: 77  Temp: (!) 97.1 F (36.2 C) Resp:   BP: (!) 142/85 SpO2: 96 %  BMI: Estimated body mass index is 31.5 kg/m as calculated from the following:   Height as of this encounter: 5' 2.5" (1.588 m).   Weight as of this encounter: 175 lb (79.4 kg).  Risk Assessment: Allergies: Reviewed. She is allergic to doxycycline hyclate and sulfa antibiotics.  Allergy Precautions: None required Coagulopathies: Reviewed. None identified.  Blood-thinner therapy: None at this time Active Infection(s): Reviewed. None identified. Ms. Schwarz is afebrile  Site Confirmation: Ms. Ramdass was asked to confirm the procedure and laterality before marking the site Procedure checklist: Completed Consent: Before the procedure and under the influence of no sedative(s), amnesic(s), or anxiolytics, the patient was informed of the treatment options, risks and possible complications. To fulfill our ethical and legal obligations, as recommended by the American Medical Association's Code of Ethics, I  have informed the patient of my clinical impression; the nature and purpose of the treatment or procedure; the risks, benefits, and possible complications of the intervention; the alternatives, including doing nothing; the risk(s) and benefit(s) of the alternative treatment(s) or procedure(s); and the risk(s) and benefit(s) of doing nothing.  Ms. Musolino was provided with information about the general risks and possible complications associated with most interventional procedures. These include, but are not limited to: failure to achieve desired goals, infection, bleeding, organ or nerve damage, allergic reactions, paralysis, and/or death.  In addition, she was informed of those risks and possible complications associated to this particular procedure, which include, but are not limited to: damage to the implant; failure to decrease pain; local, systemic, or serious CNS infections, intraspinal abscess with possible cord compression and paralysis, or life-threatening such as meningitis; bleeding; organ damage; nerve injury or damage with subsequent sensory, motor, and/or autonomic system dysfunction, resulting in transient or permanent pain, numbness, and/or weakness of one or several areas of the body; allergic reactions, either minor or major life-threatening, such as anaphylactic or anaphylactoid reactions.  Furthermore, Ms. Athens was informed of those  risks and complications associated with the medications. These include, but are not limited to: allergic reactions (i.e.: anaphylactic or anaphylactoid reactions); endorphine suppression; bradycardia and/or hypotension; water retention and/or peripheral vascular relaxation leading to lower extremity edema and possible stasis ulcers; respiratory depression and/or shortness of breath; decreased metabolic rate leading to weight gain; swelling or edema; medication-induced neural toxicity; particulate matter embolism and blood vessel occlusion with resultant  organ, and/or nervous system infarction; and/or intrathecal granuloma formation with possible spinal cord compression and permanent paralysis.  Before refilling the pump Ms. Geralds was informed that some of the medications used in the devise may not be FDA approved for such use and therefore it constitutes an off-label use of the medications.  Finally, she was informed that Medicine is not an exact science; therefore, there is also the possibility of unforeseen or unpredictable risks and/or possible complications that may result in a catastrophic outcome. The patient indicated having understood very clearly. We have given the patient no guarantees and we have made no promises. Enough time was given to the patient to ask questions, all of which were answered to the patient's satisfaction. Ms. Pore has indicated that she wanted to continue with the procedure. Attestation: I, the ordering provider, attest that I have discussed with the patient the benefits, risks, side-effects, alternatives, likelihood of achieving goals, and potential problems during recovery for the procedure that I have provided informed consent. Date  Time: 03/09/2022 12:30 PM  Pre-Procedure Preparation:  Monitoring: As per clinic protocol. Respiration, ETCO2, SpO2, BP, heart rate and rhythm monitor placed and checked for adequate function Safety Precautions: Patient was assessed for positional comfort and pressure points before starting the procedure. Time-out: I initiated and conducted the "Time-out" before starting the procedure, as per protocol. The patient was asked to participate by confirming the accuracy of the "Time Out" information. Verification of the correct person, site, and procedure were performed and confirmed by me, the nursing staff, and the patient. "Time-out" conducted as per Joint Commission's Universal Protocol (UP.01.01.01). Time: 1235  Description of Procedure:          Position: Supine Target Area:  Central-port of intrathecal pump. Approach: Anterior, 90 degree angle approach. Area Prepped: Entire Area around the pump implant. DuraPrep (Iodine Povacrylex [0.7% available iodine] and Isopropyl Alcohol, 74% w/w) Safety Precautions: Aspiration looking for blood return was conducted prior to all injections. At no point did we inject any substances, as a needle was being advanced. No attempts were made at seeking any paresthesias. Safe injection practices and needle disposal techniques used. Medications properly checked for expiration dates. SDV (single dose vial) medications used. Description of the Procedure: Protocol guidelines were followed. Two nurses trained to do implant refills were present during the entire procedure. The refill medication was checked by both healthcare providers as well as the patient. The patient was included in the "Time-out" to verify the medication. The patient was placed in position. The pump was identified. The area was prepped in the usual manner. The sterile template was positioned over the pump, making sure the side-port location matched that of the pump. Both, the pump and the template were held for stability. The needle provided in the Medtronic Kit was then introduced thru the center of the template and into the central port. The pump content was aspirated and discarded volume documented. The new medication was slowly infused into the pump, thru the filter, making sure to avoid overpressure of the device. The needle was then removed and the area  cleansed, making sure to leave some of the prepping solution back to take advantage of its long term bactericidal properties. The pump was interrogated and programmed to reflect the correct medication, volume, and dosage. The program was printed and taken to the physician for approval. Once checked and signed by the physician, a copy was provided to the patient and another scanned into the EMR.  Vitals:   03/09/22 1229  BP: (!)  142/85  Pulse: 77  Temp: (!) 97.1 F (36.2 C)  TempSrc: Temporal  SpO2: 96%  Weight: 175 lb (79.4 kg)  Height: 5' 2.5" (1.588 m)    Start Time:   hrs. End Time:   hrs. Materials & Medications: Medtronic Refill Kit Medication(s): Please see chart orders for details.  Imaging Guidance:          Type of Imaging Technique: None used Indication(s): N/A Exposure Time: No patient exposure Contrast: None used. Fluoroscopic Guidance: N/A Ultrasound Guidance: N/A Interpretation: N/A  Antibiotic Prophylaxis:   Anti-infectives (From admission, onward)    None      Indication(s): None identified  Post-operative Assessment:  Post-procedure Vital Signs:  Pulse/HCG Rate: 77  Temp: (!) 97.1 F (36.2 C) Resp:   BP: (!) 142/85 SpO2: 96 %  EBL: None  Complications: No immediate post-treatment complications observed by team, or reported by patient.  Note: The patient tolerated the entire procedure well. A repeat set of vitals were taken after the procedure and the patient was kept under observation following institutional policy, for this type of procedure. Post-procedural neurological assessment was performed, showing return to baseline, prior to discharge. The patient was provided with post-procedure discharge instructions, including a section on how to identify potential problems. Should any problems arise concerning this procedure, the patient was given instructions to immediately contact us, at any time, without hesitation. In any case, we plan to contact the patient by telephone for a follow-up status report regarding this interventional procedure.  Comments:  No additional relevant information.  Plan of Care  Orders:  Orders Placed This Encounter  Procedures   PUMP REFILL    Maintain Protocol by having two(2) healthcare providers during procedure and programming.    Scheduling Instructions:     Please refill intrathecal pump today.    Order Specific Question:   Where will  this procedure be performed?    Answer:   [facility]   PUMP REFILL    Whenever possible schedule on a procedure today.    Standing Status:   Future    Standing Expiration Date:   07/10/2022    Scheduling Instructions:     Please schedule intrathecal pump refill based on pump programming. Avoid schedule intervals of more than 120 days (4 months).    Order Specific Question:   Where will this procedure be performed?    Answer:   [facility]   Informed Consent Details: Physician/Practitioner Attestation; Transcribe to consent form and obtain patient signature    Transcribe to consent form and obtain patient signature.    Order Specific Question:   Physician/Practitioner attestation of informed consent for procedure/surgical case    Answer:   I, the physician/practitioner, attest that I have discussed with the patient the benefits, risks, side effects, alternatives, likelihood of achieving goals and potential problems during recovery for the procedure that I have provided informed consent.    Order Specific Question:   Procedure    Answer:   Intrathecal pump refill    Order Specific Question:  Physician/Practitioner performing the procedure    Answer:   Attending Physician: Kathlen Brunswick. Dossie Arbour, MD & designated trained staff    Order Specific Question:   Indication/Reason    Answer:   Chronic Pain Syndrome (G89.4), presence of an intrathecal pump (Z97.8)   Chronic Opioid Analgesic:  No other oral opioid analgesics prescribed by our practice. Intrathecal PF-Hydromorphone 3.9295 mg/day + PF-Sufentanil 34.383 mcg/day. MME: Aprox. 58.7 mg/day.   Medications ordered for procedure: No orders of the defined types were placed in this encounter.  Medications administered: [name] had no medications administered during this visit.  See the medical record for exact dosing, route, and time of administration.  Follow-up plan:   Return for Pump Refill (Max:28mo).        Interventional Therapies  Risk  Complexity Considerations:   Estimated body mass index is 32.4 kg/m as calculated from the following:   Height as of this encounter: 5' 2.5" (1.588 m).   Weight as of this encounter: 180 lb (81.6 kg). Presence of intrathecal pump  DM  CAD  Pulmonary hypertension    Planned  Pending:   Palliative intrathecal pump refills    Under consideration:   Diagnostic left lumbar facet MBB #3  Possible left lumbar facet RFA #1   Completed:   Diagnostic/therapeutic right caudal ESI x1 (01/04/2022) (100/100/100/100) (of right leg pain)  Diagnostic left lumbar facet MBB x2 (12/22/2016) (07/14/2020) (100/100/100/100) (of left low back pain)    Therapeutic  Palliative (PRN) options:   Therapeutic/palliative left sided lumbar facet MBB #3      Recent Visits Date Type Provider Dept  03/08/22 Procedure visit Milinda Pointer, MD [facility]  01/18/22 Office Visit Milinda Pointer, MD [facility]  01/04/22 Procedure visit Milinda Pointer, MD [facility]  Showing recent visits within past 90 days and meeting all other requirements Today's Visits Date Type Provider Dept  03/09/22 Procedure visit Milinda Pointer, MD [facility]  Showing today's visits and meeting all other requirements Future Appointments No visits were found meeting these conditions. Showing future appointments within next 90 days and meeting all other requirements  Disposition: Discharge home  Discharge (Date  Time): 03/09/2022;   hrs.   Primary Care Physician: Iva Lento, PA-C Location: [facility] Note by: Gaspar Cola, MD Date: 03/09/2022; Time: 12:39 PM  Disclaimer:  Medicine is not an Chief Strategy Officer. The only guarantee in medicine is that nothing is guaranteed. It is important to note that the decision to proceed with this intervention was based on the information collected from the patient. The  Data and conclusions were drawn from the patient's questionnaire, the interview, and the physical examination. Because the information was provided in large part by the patient, it cannot be guaranteed that it has not been purposely or unconsciously manipulated. Every effort has been made to obtain as much relevant data as possible for this evaluation. It is important to note that the conclusions that lead to this procedure are derived in large part from the available data. Always take into account that the treatment will also be dependent on availability of resources and existing treatment guidelines, considered by other Pain Management Practitioners as being common knowledge and practice, at the time of the intervention. For Medico-Legal purposes, it is also important to point out that variation in procedural techniques and pharmacological choices are the acceptable norm. The indications, contraindications, technique, and results of the above procedure should only be interpreted and judged by a  Board-Certified Interventional Pain Specialist with extensive familiarity and expertise in the same exact procedure and technique.

## 2022-03-09 NOTE — Patient Instructions (Signed)

## 2022-03-09 NOTE — Progress Notes (Signed)
Safety precautions to be maintained throughout the outpatient stay will include: orient to surroundings, keep bed in low position, maintain call bell within reach at all times, provide assistance with transfer out of bed and ambulation.  

## 2022-03-28 MED FILL — Medication: INTRATHECAL | Qty: 1 | Status: AC

## 2022-04-22 ENCOUNTER — Other Ambulatory Visit: Payer: Self-pay

## 2022-04-22 MED ORDER — PAIN MANAGEMENT IT PUMP REFILL: 1.0000 | Freq: Once | INTRATHECAL | 0 refills | Status: AC

## 2022-04-25 ENCOUNTER — Telehealth: Payer: Self-pay

## 2022-04-25 NOTE — Telephone Encounter (Signed)
Message received from [facility] that they would not be able to fill IT medication 05/17/22 due to not being in office 05/16/22. Latonya attemtped to call patient (Mailbox full) and husband and was unsuccessful. She will continue to call and inform if able to change appointment.

## 2022-05-17 ENCOUNTER — Encounter: Payer: Medicare Other | Admitting: Pain Medicine

## 2022-05-18 NOTE — Progress Notes (Addendum)
PROVIDER NOTE: Interpretation of information contained herein should be left to medically-trained personnel. Specific patient instructions are provided elsewhere under "Patient Instructions" section of medical record. This document was created in part using STT-dictation technology, any transcriptional errors that may result from this process are unintentional.  Patient: Tiffany Hunter Type: Established DOB: Oct 16, 1945 MRN: [id number] PCP: Iva Lento, PA-C  Service: Procedure DOS: 05/19/2022 Setting: Ambulatory Location: Ambulatory outpatient facility Delivery: Face-to-face Provider: Gaspar Cola, MD Specialty: Interventional Pain Management Specialty designation: 09 Location: Outpatient facility Ref. Prov.: Iva Lento, PA-C    Primary Reason for Visit: Interventional Pain Management Treatment. CC: Back Pain (Lumbar (waste down) bilateral )  Procedure:          Type: Management of Intrathecal Drug Delivery System (IDDS) - Reservoir Refill [phone]). No rate change.  Indications: 1. Chronic pain syndrome   2. 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 Lumbar facet joint syndrome (Left)   4. DDD (degenerative disc disease), lumbosacral   5. Chronic lower extremity pain (2ry area of Pain) (Left)   6. 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 Pharmacologic therapy   10. Chronic use of opiate for therapeutic purpose   11. Long term current use of opiate analgesic   12. Opioid-induced constipation (OIC)   13. 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 Encounter for adjustment or management of infusion pump   15. Encounter for chronic pain management    Pain Assessment: Self-Reported Pain Score: 10-Worst pain ever/10             Reported level is compatible with observation.         The patient indicates currently having a flareup of her low back pain.  This pain is limited to the lower back and it is primarily centered midline, but it covers both sides.  In the past she has had this kind of a problem which we have treated with left-sided lumbar facet blocks.  This time the pain seems to be more bilateral and therefore we will go ahead and schedule her for a bilateral lumbar facet block.  The last time that she had this done was on 07/14/2020.  They typically will last over a year.   Intrathecal Drug Delivery System (IDDS)  Pump Device:  Manufacturer: Medtronic Model: Synchromed II Model No.: [id number] Serial No.: [id number] H Delivery Route: Intrathecal Type: Programmable  Volume (mL): 40 mL reservoir Priming Volume: N/A  Calibration Constant: 114.0  MRI compatibility: Conditional   Implant Details:  Date: 07/07/2017 Implanter: Roderic Palau, MD Contact Information: [facility] [phone] Last Revision/Replacement: 07/07/2017 Estimated Replacement Date: Nov/2025  Implant Site: Abdominal Laterality: Left  Catheter: Manufacturer: Medtronic Model:  Intrathecal catheter Model No.: 8731  Serial No.: n/a  Implanted Length (cm): 28.9  Catheter Volume (mL): 0.209  Tip Location (Level): T11 (Left) Canal Access Site: L2-3  Drug content:  Primary Medication Class: Opioid  Medication: PF-Hydromorphone (Dilaudid)  Concentration: 8 mg/mL   Secondary Medication Class: Opioid  Medication: PF-Sufentanil  Concentration: 70 mcg/mL   Tertiary Medication Class: none  Medication: none   PA parameters (PCA-mode):  Mode: Off (Inactive)  Programming:  Type: Simple continuous.  Medication, Concentration, Infusion Program, & Delivery Rate: For up-to-date details please see most recent scanned programming printout.   Intrathecal Pump Therapy  Assessment  Manufacturer: Medtronic Synchromed Type: Programmable Volume: 40 mL reservoir MRI compatibility: Yes     Drug content:  Primary Medication Class: Opioid Primary Medication: PF-Hydromorphone (Dilaudid) (8 mg/mL)  Secondary Medication: PF-Sufentanil (70 g/mL)  Other Medication: No third    Programming:  Type: Simple continuous. See pump readout for details.            Changes:  Medication Change: None at this point Rate Change: No change in rate  Reported side-effects or adverse reactions: None reported  Effectiveness: Described as relatively effective, allowing for increase in activities of daily living (ADL) Clinically meaningful improvement in function (CMIF): Sustained CMIF goals met  Plan: Pump refill today   Pharmacotherapy Assessment   Opioid Analgesic: No other oral opioid analgesics prescribed by our practice. Intrathecal PF-Hydromorphone 3.9295 mg/day + PF-Sufentanil 34.383 mcg/day. MME: Aprox. 58.7 mg/day.   Monitoring: [place] PMP: PDMP reviewed during this encounter.       Pharmacotherapy: No side-effects or adverse reactions reported. Compliance: No problems identified. Effectiveness: Clinically acceptable. Plan: Refer to "POC". UDS:  Summary  Date Value Ref Range Status  07/30/2019 Note  Final    Comment:    ==================================================================== Compliance Drug Analysis, Ur ==================================================================== Test                             Result       Flag       Units Drug Present not Declared for Prescription Verification   Hydromorphone                  1757         UNEXPECTED ng/mg creat    Hydromorphone may be administered as a scheduled prescription    medication; it is also an expected metabolite of hydrocodone.   Naproxen                       PRESENT      UNEXPECTED Drug Absent but Declared for Prescription Verification   Salicylate                     Not Detected UNEXPECTED    Aspirin, as indicated in the declared medication list, is not always    detected even when used as  directed. ==================================================================== Test                      Result    Flag   Units      Ref Range   Creatinine              182              mg/dL      >=20 ==================================================================== Declared Medications:  The flagging and interpretation on this report are based on the  following declared medications.  Unexpected results may arise from  inaccuracies in the declared medications.  **Note: The testing scope of this panel does not include small to  moderate amounts of these reported medications:  Aspirin  **Note: The testing scope of this panel does not include the  following reported medications:  Albuterol (Ventolin HFA)  Atorvastatin (Lipitor)  Betamethasone (Lotrisone)  Carvedilol (Coreg)  Cholecalciferol  Clotrimazole (Lotrisone)  Hydrocortisone  Mirabegron (Myrbetriq)  Naloxegol (Movantik)  Oxybutynin (Ditropan)  Supplement ==================================================================== For clinical consultation, please call [phone]. ====================================================================    No results found for: "CBDTHCR", "D8THCCBX", "D9THCCBX"   Pre-op  H&P Assessment:  Ms. Hussey is a 77 y.o. (year old), female patient, seen today for interventional treatment. She  has a past surgical history that includes Carpal tunnel release (Bilateral); Back surgery; Breast surgery (Bilateral); Cholecystectomy; Colonoscopy w/ polypectomy; Cataract extraction w/ intraocular lens implant (Right); Abdominal hysterectomy; Fracture surgery (Right); Cardiac catheterization (2012); and Pain pump revision (Left, 07/07/2017). Ms. Escalera has a current medication list which includes the following prescription(s): albuterol, atorvastatin, carvedilol, celecoxib, diethylpropion hcl cr, losartan, meloxicam, NON FORMULARY, omeprazole, oxybutynin, ssd, diclofenac sodium, and naloxegol oxalate.  Her primarily concern today is the Back Pain (Lumbar (waste down) bilateral )  Initial Vital Signs:  Pulse/HCG Rate: 72  Temp: (!) 96.8 F (36 C) Resp: 16 BP: (!) 161/85 SpO2: 96 %  BMI: Estimated body mass index is 30.6 kg/m as calculated from the following:   Height as of this encounter: 5' 2.5" (1.588 m).   Weight as of this encounter: 170 lb (77.1 kg).  Risk Assessment: Allergies: Reviewed. She is allergic to doxycycline hyclate and sulfa antibiotics.  Allergy Precautions: None required Coagulopathies: Reviewed. None identified.  Blood-thinner therapy: None at this time Active Infection(s): Reviewed. None identified. Ms. Hodgens is afebrile  Site Confirmation: Ms. Humphreys was asked to confirm the procedure and laterality before marking the site Procedure checklist: Completed Consent: Before the procedure and under the influence of no sedative(s), amnesic(s), or anxiolytics, the patient was informed of the treatment options, risks and possible complications. To fulfill our ethical and legal obligations, as recommended by the American Medical Association's Code of Ethics, I have informed the patient of my clinical impression; the nature and purpose of the treatment or procedure; the risks, benefits, and possible complications of the intervention; the alternatives, including doing nothing; the risk(s) and benefit(s) of the alternative treatment(s) or procedure(s); and the risk(s) and benefit(s) of doing nothing.  Ms. Fahrner was provided with information about the general risks and possible complications associated with most interventional procedures. These include, but are not limited to: failure to achieve desired goals, infection, bleeding, organ or nerve damage, allergic reactions, paralysis, and/or death.  In addition, she was informed of those risks and possible complications associated to this particular procedure, which include, but are not limited to: damage to the implant;  failure to decrease pain; local, systemic, or serious CNS infections, intraspinal abscess with possible cord compression and paralysis, or life-threatening such as meningitis; bleeding; organ damage; nerve injury or damage with subsequent sensory, motor, and/or autonomic system dysfunction, resulting in transient or permanent pain, numbness, and/or weakness of one or several areas of the body; allergic reactions, either minor or major life-threatening, such as anaphylactic or anaphylactoid reactions.  Furthermore, Ms. Hatler was informed of those risks and complications associated with the medications. These include, but are not limited to: allergic reactions (i.e.: anaphylactic or anaphylactoid reactions); endorphine suppression; bradycardia and/or hypotension; water retention and/or peripheral vascular relaxation leading to lower extremity edema and possible stasis ulcers; respiratory depression and/or shortness of breath; decreased metabolic rate leading to weight gain; swelling or edema; medication-induced neural toxicity; particulate matter embolism and blood vessel occlusion with resultant organ, and/or nervous system infarction; and/or intrathecal granuloma formation with possible spinal cord compression and permanent paralysis.  Before refilling the pump Ms. Emmick was informed that some of the medications used in the devise may not be FDA approved for such use and therefore it constitutes an off-label use of the medications.  Finally, she was informed that Medicine is not an exact science; therefore, there  is also the possibility of unforeseen or unpredictable risks and/or possible complications that may result in a catastrophic outcome. The patient indicated having understood very clearly. We have given the patient no guarantees and we have made no promises. Enough time was given to the patient to ask questions, all of which were answered to the patient's satisfaction. Ms. Sanzone has indicated  that she wanted to continue with the procedure. Attestation: I, the ordering provider, attest that I have discussed with the patient the benefits, risks, side-effects, alternatives, likelihood of achieving goals, and potential problems during recovery for the procedure that I have provided informed consent. Date  Time: 05/19/2022  1:01 PM  Pre-Procedure Preparation:  Monitoring: As per clinic protocol. Respiration, ETCO2, SpO2, BP, heart rate and rhythm monitor placed and checked for adequate function Safety Precautions: Patient was assessed for positional comfort and pressure points before starting the procedure. Time-out: I initiated and conducted the "Time-out" before starting the procedure, as per protocol. The patient was asked to participate by confirming the accuracy of the "Time Out" information. Verification of the correct person, site, and procedure were performed and confirmed by me, the nursing staff, and the patient. "Time-out" conducted as per Joint Commission's Universal Protocol (UP.01.01.01). Time: 1259  Description of Procedure:          Position: Supine Target Area: Central-port of intrathecal pump. Approach: Anterior, 90 degree angle approach. Area Prepped: Entire Area around the pump implant. DuraPrep (Iodine Povacrylex [0.7% available iodine] and Isopropyl Alcohol, 74% w/w) Safety Precautions: Aspiration looking for blood return was conducted prior to all injections. At no point did we inject any substances, as a needle was being advanced. No attempts were made at seeking any paresthesias. Safe injection practices and needle disposal techniques used. Medications properly checked for expiration dates. SDV (single dose vial) medications used. Description of the Procedure: Protocol guidelines were followed. Two nurses trained to do implant refills were present during the entire procedure. The refill medication was checked by both healthcare providers as well as the patient. The  patient was included in the "Time-out" to verify the medication. The patient was placed in position. The pump was identified. The area was prepped in the usual manner. The sterile template was positioned over the pump, making sure the side-port location matched that of the pump. Both, the pump and the template were held for stability. The needle provided in the Medtronic Kit was then introduced thru the center of the template and into the central port. The pump content was aspirated and discarded volume documented. The new medication was slowly infused into the pump, thru the filter, making sure to avoid overpressure of the device. The needle was then removed and the area cleansed, making sure to leave some of the prepping solution back to take advantage of its long term bactericidal properties. The pump was interrogated and programmed to reflect the correct medication, volume, and dosage. The program was printed and taken to the physician for approval. Once checked and signed by the physician, a copy was provided to the patient and another scanned into the EMR.  Vitals:   05/19/22 1300  BP: (!) 161/85  Pulse: 72  Resp: 16  Temp: (!) 96.8 F (36 C)  TempSrc: Temporal  SpO2: 96%  Weight: 170 lb (77.1 kg)  Height: 5' 2.5" (1.588 m)    Start Time: 1259 hrs. End Time: 1311 hrs. Materials & Medications: Medtronic Refill Kit Medication(s): Please see chart orders for details.  Imaging Guidance:  Type of Imaging Technique: None used Indication(s): N/A Exposure Time: No patient exposure Contrast: None used. Fluoroscopic Guidance: N/A Ultrasound Guidance: N/A Interpretation: N/A  Antibiotic Prophylaxis:   Anti-infectives (From admission, onward)    None      Indication(s): None identified  Post-operative Assessment:  Post-procedure Vital Signs:  Pulse/HCG Rate: 72  Temp: (!) 96.8 F (36 C) Resp: 16 BP: (!) 161/85 SpO2: 96 %  EBL: None  Complications: No immediate  post-treatment complications observed by team, or reported by patient.  Note: The patient tolerated the entire procedure well. A repeat set of vitals were taken after the procedure and the patient was kept under observation following institutional policy, for this type of procedure. Post-procedural neurological assessment was performed, showing return to baseline, prior to discharge. The patient was provided with post-procedure discharge instructions, including a section on how to identify potential problems. Should any problems arise concerning this procedure, the patient was given instructions to immediately contact us, at any time, without hesitation. In any case, we plan to contact the patient by telephone for a follow-up status report regarding this interventional procedure.  Comments:  No additional relevant information.  Plan of Care  Orders:  Orders Placed This Encounter  Procedures   PUMP REFILL    Maintain Protocol by having two(2) healthcare providers during procedure and programming.    Scheduling Instructions:     Please refill intrathecal pump today.    Order Specific Question:   Where will this procedure be performed?    Answer:   [facility]   PUMP REFILL    Whenever possible schedule on a procedure today.    Standing Status:   Future    Standing Expiration Date:   09/16/2022    Scheduling Instructions:     Please schedule intrathecal pump refill based on pump programming. Avoid schedule intervals of more than 120 days (4 months).    Order Specific Question:   Where will this procedure be performed?    Answer:   [facility]   LUMBAR FACET(MEDIAL BRANCH NERVE BLOCK) MBNB    Standing Status:   Future    Standing Expiration Date:   08/23/2022    Scheduling Instructions:     Procedure: Lumbar facet block (AKA.: Lumbosacral medial branch nerve block)     Side: Bilateral     Level: L4-5 and L5-S1 Facets (L3, L4, L5, and S1 Medial Branch)     Sedation: With  Sedation.     Timeframe: ASAA    Order Specific Question:   Where will this procedure be performed?    Answer:   [facility]   Informed Consent Details: Physician/Practitioner Attestation; Transcribe to consent form and obtain patient signature    Transcribe to consent form and obtain patient signature.    Order Specific Question:   Physician/Practitioner attestation of informed consent for procedure/surgical case    Answer:   I, the physician/practitioner, attest that I have discussed with the patient the benefits, risks, side effects, alternatives, likelihood of achieving goals and potential problems during recovery for the procedure that I have provided informed consent.    Order Specific Question:   Procedure    Answer:   Intrathecal pump refill    Order Specific Question:   Physician/Practitioner performing the procedure    Answer:   Attending Physician: Kathlen Brunswick. Dossie Arbour, MD & designated trained staff    Order Specific Question:   Indication/Reason    Answer:   Chronic Pain Syndrome (G89.4),  presence of an intrathecal pump (Z97.8)   Chronic Opioid Analgesic:  No other oral opioid analgesics prescribed by our practice. Intrathecal PF-Hydromorphone 3.9295 mg/day + PF-Sufentanil 34.383 mcg/day. MME: Aprox. 58.7 mg/day.   Medications ordered for procedure: No orders of the defined types were placed in this encounter.  Medications administered: [name] had no medications administered during this visit.  See the medical record for exact dosing, route, and time of administration.  Follow-up plan:   Return for (ECT): (B) L-FCT Blk #3.       Interventional Therapies  Risk  Complexity Considerations:   Presence of intrathecal pump  DM  CAD  Pulmonary hypertension    Planned  Pending:   Diagnostic/therapeutic bilateral lumbar (L4-5, L5-S1) facet MBB L3R1  Palliative intrathecal pump refills    Under consideration:   Diagnostic left lumbar facet MBB #3   Possible left lumbar facet RFA #1   Completed:   Diagnostic/therapeutic right caudal ESI x1 (01/04/2022) (100/100/100/100) (of right leg pain)  Diagnostic left lumbar facet MBB x2 (12/22/2016) (07/14/2020) (100/100/100/100) (of left low back pain)    Therapeutic  Palliative (PRN) options:   Therapeutic/palliative left sided lumbar facet MBB #3       Recent Visits Date Type Provider Dept  05/19/22 Procedure visit Milinda Pointer, MD [facility]  03/09/22 Procedure visit Milinda Pointer, MD [facility]  Showing recent visits within past 90 days and meeting all other requirements Future Appointments Date Type Provider Dept  07/26/22 Appointment Milinda Pointer, MD [facility]  Showing future appointments within next 90 days and meeting all other requirements  Disposition: Discharge home  Discharge (Date  Time): 05/19/2022; 1320 hrs.   Primary Care Physician: Iva Lento, PA-C Location: [facility] Note by: Gaspar Cola, MD Date: 05/19/2022; Time: 1:00 PM  Disclaimer:  Medicine is not an Chief Strategy Officer. The only guarantee in medicine is that nothing is guaranteed. It is important to note that the decision to proceed with this intervention was based on the information collected from the patient. The Data and conclusions were drawn from the patient's questionnaire, the interview, and the physical examination. Because the information was provided in large part by the patient, it cannot be guaranteed that it has not been purposely or unconsciously manipulated. Every effort has been made to obtain as much relevant data as possible for this evaluation. It is important to note that the conclusions that lead to this procedure are derived in large part from the available data. Always take into account that the treatment will also be dependent on availability of resources and existing treatment guidelines, considered by other  Pain Management Practitioners as being common knowledge and practice, at the time of the intervention. For Medico-Legal purposes, it is also important to point out that variation in procedural techniques and pharmacological choices are the acceptable norm. The indications, contraindications, technique, and results of the above procedure should only be interpreted and judged by a Board-Certified Interventional Pain Specialist with extensive familiarity and expertise in the same exact procedure and technique.

## 2022-05-19 ENCOUNTER — Encounter: Payer: Self-pay | Admitting: Pain Medicine

## 2022-05-19 ENCOUNTER — Ambulatory Visit: Payer: Medicare Other | Attending: Pain Medicine | Admitting: Pain Medicine

## 2022-05-19 VITALS — BP 161/85 | HR 72 | Temp 96.8°F | Resp 16 | Ht 62.5 in | Wt 170.0 lb

## 2022-05-19 DIAGNOSIS — M47816 Spondylosis without myelopathy or radiculopathy, lumbar region: Secondary | ICD-10-CM | POA: Diagnosis present

## 2022-05-19 DIAGNOSIS — G8929 Other chronic pain: Secondary | ICD-10-CM | POA: Diagnosis present

## 2022-05-19 DIAGNOSIS — Z79891 Long term (current) use of opiate analgesic: Secondary | ICD-10-CM | POA: Diagnosis present

## 2022-05-19 DIAGNOSIS — M533 Sacrococcygeal disorders, not elsewhere classified: Secondary | ICD-10-CM | POA: Diagnosis present

## 2022-05-19 DIAGNOSIS — K5903 Drug induced constipation: Secondary | ICD-10-CM | POA: Diagnosis present

## 2022-05-19 DIAGNOSIS — M961 Postlaminectomy syndrome, not elsewhere classified: Secondary | ICD-10-CM | POA: Diagnosis present

## 2022-05-19 DIAGNOSIS — M5137 Other intervertebral disc degeneration, lumbosacral region: Secondary | ICD-10-CM | POA: Diagnosis present

## 2022-05-19 DIAGNOSIS — Z978 Presence of other specified devices: Secondary | ICD-10-CM | POA: Diagnosis present

## 2022-05-19 DIAGNOSIS — M5442 Lumbago with sciatica, left side: Secondary | ICD-10-CM | POA: Insufficient documentation

## 2022-05-19 DIAGNOSIS — G894 Chronic pain syndrome: Secondary | ICD-10-CM

## 2022-05-19 DIAGNOSIS — M5416 Radiculopathy, lumbar region: Secondary | ICD-10-CM | POA: Insufficient documentation

## 2022-05-19 DIAGNOSIS — T402X5A Adverse effect of other opioids, initial encounter: Secondary | ICD-10-CM | POA: Insufficient documentation

## 2022-05-19 DIAGNOSIS — Z79899 Other long term (current) drug therapy: Secondary | ICD-10-CM

## 2022-05-19 DIAGNOSIS — Z451 Encounter for adjustment and management of infusion pump: Secondary | ICD-10-CM | POA: Diagnosis present

## 2022-05-19 DIAGNOSIS — M79605 Pain in left leg: Secondary | ICD-10-CM | POA: Insufficient documentation

## 2022-05-19 NOTE — Patient Instructions (Addendum)
______________________________________________________________________  Preparing for your procedure  During your procedure appointment there will be: No Prescription Refills. No disability issues to discussed. No medication changes or discussions.  Instructions: Food intake: Avoid eating anything solid for at least 8 hours prior to your procedure. Clear liquid intake: You may take clear liquids such as water up to 2 hours prior to your procedure. (No carbonated drinks. No soda.) Transportation: Unless otherwise stated by your physician, bring a driver. Morning Medicines: Except for blood thinners, take all of your other morning medications with a sip of water. Make sure to take your heart and blood pressure medicines. If your blood pressure's lower number is above 100, the case will be rescheduled. Blood thinners: If you take a blood thinner, but were not instructed to stop it, call our office [phone] and ask to talk to a nurse. Not stopping a blood thinner prior to certain procedures could lead to serious complications. Diabetics on insulin: Notify the staff so that you can be scheduled 1st case in the morning. If your diabetes requires high dose insulin, take only  of your normal insulin dose the morning of the procedure and notify the staff that you have done so. Preventing infections: Shower with an antibacterial soap the morning of your procedure.  Build-up your immune system: Take 1000 mg of Vitamin C with every meal (3 times a day) the day prior to your procedure. Antibiotics: Inform the nursing staff if you are taking any antibiotics or if you have any conditions that may require antibiotics prior to procedures. (Example: recent joint implants)   Pregnancy: If you are pregnant make sure to notify the nursing staff. Not doing so may result in injury to the fetus, including death.  Sickness: If you have a cold, fever, or any active infections, call and cancel or reschedule your  procedure. Receiving steroids while having an infection may result in complications. Arrival: You must be in the facility at least 30 minutes prior to your scheduled procedure. Tardiness: Your scheduled time is also the cutoff time. If you do not arrive at least 15 minutes prior to your procedure, you will be rescheduled.  Children: Do not bring any children with you. Make arrangements to keep them home. Dress appropriately: There is always a possibility that your clothing may get soiled. Avoid long dresses. Valuables: Do not bring any jewelry or valuables.  Reasons to call and reschedule or cancel your procedure: (Following these recommendations will minimize the risk of a serious complication.) Surgeries: Avoid having procedures within 2 weeks of any surgery. (Avoid for 2 weeks before or after any surgery). Flu Shots: Avoid having procedures within 2 weeks of a flu shots or . (Avoid for 2 weeks before or after immunizations). Barium: Avoid having a procedure within 7-10 days after having had a radiological study involving the use of radiological contrast. (Myelograms, Barium swallow or enema study). Heart attacks: Avoid any elective procedures or surgeries for the initial 6 months after a "Myocardial Infarction" (Heart Attack). Blood thinners: It is imperative that you stop these medications before procedures. Let us know if you if you take any blood thinner.  Infection: Avoid procedures during or within two weeks of an infection (including chest colds or gastrointestinal problems). Symptoms associated with infections include: Localized redness, fever, chills, night sweats or profuse sweating, burning sensation when voiding, cough, congestion, stuffiness, runny nose, sore throat, diarrhea, nausea, vomiting, cold or Flu symptoms, recent or current infections. It is specially important if the infection is  over the area that we intend to treat. Heart and lung problems: Symptoms that may suggest an  active cardiopulmonary problem include: cough, chest pain, breathing difficulties or shortness of breath, dizziness, ankle swelling, uncontrolled high or unusually low blood pressure, and/or palpitations. If you are experiencing any of these symptoms, cancel your procedure and contact your primary care physician for an evaluation.  Remember:  Regular Business hours are:  Monday to Thursday 8:00 AM to 4:00 PM  Provider's Schedule: Milinda Pointer, MD:  Procedure days: Tuesday and Thursday 7:30 AM to 4:00 PM  Gillis Santa, MD:  Procedure days: Monday and Wednesday 7:30 AM to 4:00 PM  ______________________________________________________________________    ____________________________________________________________________________________________  General Risks and Possible Complications  Patient Responsibilities: It is important that you read this as it is part of your informed consent. It is our duty to inform you of the risks and possible complications associated with treatments offered to you. It is your responsibility as a patient to read this and to ask questions about anything that is not clear or that you believe was not covered in this document.  Patient's Rights: You have the right to refuse treatment. You also have the right to change your mind, even after initially having agreed to have the treatment done. However, under this last option, if you wait until the last second to change your mind, you may be charged for the materials used up to that point.  Introduction: Medicine is not an Chief Strategy Officer. Everything in Medicine, including the lack of treatment(s), carries the potential for danger, harm, or loss (which is by definition: Risk). In Medicine, a complication is a secondary problem, condition, or disease that can aggravate an already existing one. All treatments carry the risk of possible complications. The fact that a side effects or complications occurs, does not imply  that the treatment was conducted incorrectly. It must be clearly understood that these can happen even when everything is done following the highest safety standards.  No treatment: You can choose not to proceed with the proposed treatment alternative. The "PRO(s)" would include: avoiding the risk of complications associated with the therapy. The "CON(s)" would include: not getting any of the treatment benefits. These benefits fall under one of three categories: diagnostic; therapeutic; and/or palliative. Diagnostic benefits include: getting information which can ultimately lead to improvement of the disease or symptom(s). Therapeutic benefits are those associated with the successful treatment of the disease. Finally, palliative benefits are those related to the decrease of the primary symptoms, without necessarily curing the condition (example: decreasing the pain from a flare-up of a chronic condition, such as incurable terminal cancer).  General Risks and Complications: These are associated to most interventional treatments. They can occur alone, or in combination. They fall under one of the following six (6) categories: no benefit or worsening of symptoms; bleeding; infection; nerve damage; allergic reactions; and/or death. No benefits or worsening of symptoms: In Medicine there are no guarantees, only probabilities. No healthcare provider can ever guarantee that a medical treatment will work, they can only state the probability that it may. Furthermore, there is always the possibility that the condition may worsen, either directly, or indirectly, as a consequence of the treatment. Bleeding: This is more common if the patient is taking a blood thinner, either prescription or over the counter (example: Goody Powders, Fish oil, Aspirin, Garlic, etc.), or if suffering a condition associated with impaired coagulation (example: Hemophilia, cirrhosis of the liver, low platelet counts, etc.). However, even if  you  do not have one on these, it can still happen. If you have any of these conditions, or take one of these drugs, make sure to notify your treating physician. Infection: This is more common in patients with a compromised immune system, either due to disease (example: diabetes, cancer, human immunodeficiency virus [HIV], etc.), or due to medications or treatments (example: therapies used to treat cancer and rheumatological diseases). However, even if you do not have one on these, it can still happen. If you have any of these conditions, or take one of these drugs, make sure to notify your treating physician. Nerve Damage: This is more common when the treatment is an invasive one, but it can also happen with the use of medications, such as those used in the treatment of cancer. The damage can occur to small secondary nerves, or to large primary ones, such as those in the spinal cord and brain. This damage may be temporary or permanent and it may lead to impairments that can range from temporary numbness to permanent paralysis and/or brain death. Allergic Reactions: Any time a substance or material comes in contact with our body, there is the possibility of an allergic reaction. These can range from a mild skin rash (contact dermatitis) to a severe systemic reaction (anaphylactic reaction), which can result in death. Death: In general, any medical intervention can result in death, most of the time due to an unforeseen complication. ____________________________________________________________________________________________     Opioid Overdose Opioids are drugs that are often used to treat pain. Opioids include illegal drugs, such as heroin, as well as prescription pain medicines, such as codeine, morphine, hydrocodone, and fentanyl. An opioid overdose happens when you take too much of an opioid. An overdose may be intentional or accidental and can happen with any type of opioid. The effects of an overdose can  be mild, dangerous, or even deadly. Opioid overdose is a medical emergency. What are the causes? This condition may be caused by: Taking too much of an opioid on purpose. Taking too much of an opioid by accident. Using two or more substances that contain opioids at the same time. Taking an opioid with a substance that affects your heart, breathing, or blood pressure. These include alcohol, tranquilizers, sleeping pills, illegal drugs, and some over-the-counter medicines. This condition may also happen due to an error made by: A health care provider who prescribes a medicine. The pharmacist who fills the prescription. What increases the risk? This condition is more likely in: Children. They may be attracted to colorful pills. Because of a child's small size, even a small amount of a medicine can be dangerous. Older people. They may be taking many different medicines. Older people may have difficulty reading labels or remembering when they last took their medicines. They may also be more sensitive to the effects of opioids. People with chronic medical conditions, especially heart, liver, kidney, or neurological diseases. People who take an opioid for a long period of time. People who take opioids and use illegal drugs, such as heroin, or other substances, such as alcohol. People who: Have a history of drug or alcohol abuse. Have certain mental health conditions. Have a history of previous drug overdoses. People who take opioids that are not prescribed for them. What are the signs or symptoms? Symptoms of this condition depend on the type of opioid and the amount that was taken. Common symptoms include: Sleepiness or difficulty waking from sleep. Confusion. Slurred speech. Slowed breathing and a slow pulse (  bradycardia). Nausea and vomiting. Abnormally small pupils. Signs and symptoms that require emergency treatment include: Cold, clammy, and pale skin. Blue lips and  fingernails. Vomiting. Gurgling sounds in the throat. A pulse that is very slow or difficult to detect. Breathing that is very irregular, slow, noisy, or difficult to detect. Inability to respond to speech or be awakened from sleep (stupor). Seizures. How is this diagnosed? This condition is diagnosed based on your symptoms and medical history. It is important to tell your health care provider: About all of the opioids that you took. When you took the opioids. Whether you were drinking alcohol or using marijuana, cocaine, or other drugs. Your health care provider will do a physical exam. This exam may include: Checking and monitoring your heart rate and rhythm, breathing rate, temperature, and blood pressure. Measuring oxygen levels in your blood. Checking for abnormally small pupils. You may also have blood tests or urine tests. You may have X-rays if you are having severe breathing problems. How is this treated? This condition requires immediate medical treatment and hospitalization. Reversing the effects of the opioid is the first step in treatment. If you have a Narcan kit or naloxone, use it right away. Follow your health care provider's instructions. A friend or family member can also help you with this. The rest of your treatment will be given in the hospital intensive care (ICU). Treatment in the hospital may include: Giving salts and minerals (electrolytes) along with fluids through an IV. Inserting a breathing tube (endotracheal tube) in your airway to help you breathe if you cannot breathe on your own or you are in danger of not being able to breathe on your own. Giving oxygen through a small tube under your nose. Passing a tube through your nose and into your stomach (nasogastric tube, or NG tube) to empty your stomach. Giving medicines that: Increase your blood pressure. Relieve nausea and vomiting. Relieve abdominal pain and cramping. Reverse the effects of the opioid  (naloxone). Monitoring your heart and oxygen levels. Ongoing counseling and mental health support if you intentionally overdosed or used an illegal drug. Follow these instructions at home:  Medicines Take over-the-counter and prescription medicines only as told by your health care provider. Always ask your health care provider about possible side effects and interactions of any new medicine that you start taking. Keep a list of all the medicines that you take, including over-the-counter medicines. Bring this list with you to all your medical visits. General instructions Drink enough fluid to keep your urine pale yellow. Keep all follow-up visits. This is important. How is this prevented? Read the drug inserts that come with your opioid pain medicines. Take medicines only as told by your health care provider. Do not take more medicine than you are told. Do not take medicines more frequently than you are told. Do not drink alcohol or take sedatives when taking opioids. Do not use illegal or recreational drugs, including cocaine, ecstasy, and marijuana. Do not take opioid medicines that are not prescribed for you. Store all medicines in safety containers that are out of the reach of children. Get help if you are struggling with: Alcohol or drug use. Depression or another mental health problem. Thoughts of hurting yourself or another person. Keep the phone number of your local poison control center near your phone or in your mobile phone. In the [place] the hotline of the [facility] ([facility]) is (747)750-5608. If you were prescribed naloxone, make sure you understand how  to take it. Contact a health care provider if: You need help understanding how to take your pain medicines. You feel your medicines are too strong. You are concerned that your pain medicines are not working well for your pain. You develop new symptoms or side effects when you are taking medicines. Get help right  away if: You or someone else is having symptoms of an opioid overdose. Get help even if you are not sure. You have thoughts about hurting yourself or others. You have: Chest pain. Difficulty breathing. A loss of consciousness. These symptoms may represent a serious problem that is an emergency. Do not wait to see if the symptoms will go away. Get medical help right away. Call your local emergency services (911 in the [place] Do not drive yourself to the hospital. If you ever feel like you may hurt yourself or others, or have thoughts about taking your own life, get help right away. You can go to your nearest emergency department or: Call your local emergency services (911 in the [place] Call a suicide crisis helpline, such as the [place] at 972-656-8941 or 988 in the [place]. This is open 24 hours a day in the [place] Text the Crisis Text Line at 435-155-1709 (in the [place].). Summary Opioids are drugs that are often used to treat pain. Opioids include illegal drugs, such as heroin, as well as prescription pain medicines. An opioid overdose happens when you take too much of an opioid. Overdoses can be intentional or accidental. Opioid overdose is very dangerous. It is a life-threatening emergency. If you or someone you know is experiencing an opioid overdose, get help right away. This information is not intended to replace advice given to you by your health care provider. Make sure you discuss any questions you have with your health care provider. Document Revised: 11/25/2020 Document Reviewed: 08/12/2020 Elsevier Patient Education  [place].  ____________________________________________________________________________________________  Patient Information update  To: All of our patients.  Re: Name change.  It has been made official that our current name, "[facility]"   will soon be changed to "[facility]".   The purpose of this change is to eliminate any confusion created by the concept of our practice being a "Medication Management Pain Clinic". In the past this has led to the misconception that we treat pain primarily by the use of prescription medications.  Nothing can be farther from the truth.   Understanding PAIN MANAGEMENT: To further understand what our practice does, you first have to understand that "Pain Management" is a subspecialty that requires additional training once a physician has completed their specialty training, which can be in either Anesthesia, Neurology, Psychiatry, or Physical Medicine and Rehabilitation (PMR). Each one of these contributes to the final approach taken by each physician to the management of their patient's pain. To be a "Pain Management Specialist" you must have first completed one of the specialty trainings below.  Anesthesiologists - trained in clinical pharmacology and interventional techniques such as nerve blockade and regional as well as central neuroanatomy. They are trained to block pain before, during, and after surgical interventions.  Neurologists - trained in the diagnosis and pharmacological treatment of complex neurological conditions, such as Multiple Sclerosis, Parkinson's, spinal cord injuries, and other systemic conditions that may be associated with symptoms that may include but are not limited to pain. They tend to rely primarily on the treatment of chronic  pain using prescription medications.  Psychiatrist - trained in conditions affecting the psychosocial wellbeing of patients including but not limited to depression, anxiety, schizophrenia, personality disorders, addiction, and other substance use disorders that may be associated with chronic pain. They tend to rely primarily on the treatment of chronic pain using prescription medications.   Physical Medicine and Rehabilitation  (PMR) physicians, also known as physiatrists - trained to treat a wide variety of medical conditions affecting the brain, spinal cord, nerves, bones, joints, ligaments, muscles, and tendons. Their training is primarily aimed at treating patients that have suffered injuries that have caused severe physical impairment. Their training is primarily aimed at the physical therapy and rehabilitation of those patients. They may also work alongside orthopedic surgeons or neurosurgeons using their expertise in assisting surgical patients to recover after their surgeries.  INTERVENTIONAL PAIN MANAGEMENT is sub-subspecialty of Pain Management.  Our physicians are Board-certified in Anesthesia, Pain Management, and Interventional Pain Management.  This meaning that not only have they been trained and Board-certified in their specialty of Anesthesia, and subspecialty of Pain Management, but they have also received further training in the sub-subspecialty of Interventional Pain Management, in order to become Board-certified as INTERVENTIONAL PAIN MANAGEMENT SPECIALIST.    Mission: Our goal is to use our skills in  [place] as alternatives to the chronic use of prescription opioid medications for the treatment of pain. To make this more clear, we have changed our name to reflect what we do and offer. We will continue to offer medication management assessment and recommendations, but we will not be taking over any patient's medication management.  ____________________________________________________________________________________________    Facet Blocks Patient Information  Description: The facets are joints in the spine between the vertebrae.  Like any joints in the body, facets can become irritated and painful.  Arthritis can also effect the facets.  By injecting steroids and local anesthetic in and around these joints, we can temporarily block the nerve supply to them.  Steroids act directly on  irritated nerves and tissues to reduce selling and inflammation which often leads to decreased pain.  Facet blocks may be done anywhere along the spine from the neck to the low back depending upon the location of your pain.   After numbing the skin with local anesthetic (like Novocaine), a small needle is passed onto the facet joints under x-ray guidance.  You may experience a sensation of pressure while this is being done.  The entire block usually lasts about 15-25 minutes.   Conditions which may be treated by facet blocks:  Low back/buttock pain Neck/shoulder pain Certain types of headaches  Preparation for the injection:  Do not eat any solid food or dairy products within 8 hours of your appointment. You may drink clear liquid up to 3 hours before appointment.  Clear liquids include water, black coffee, juice or soda.  No milk or cream please. You may take your regular medication, including pain medications, with a sip of water before your appointment.  Diabetics should hold regular insulin (if taken separately) and take 1/2 normal NPH dose the morning of the procedure.  Carry some sugar containing items with you to your appointment. A driver must accompany you and be prepared to drive you home after your procedure. Bring all your current medications with you. An IV may be inserted and sedation may be given at the discretion of the physician. A blood pressure cuff, EKG and other monitors will often be applied during the procedure.  Some patients may need to have extra oxygen administered for a short period. You will be asked to provide medical information, including your allergies and medications, prior to the procedure.  We must know immediately if you are taking blood thinners (like Coumadin/Warfarin) or if you are allergic to IV iodine contrast (dye).  We must know if you could possible be pregnant.  Possible side-effects:  Bleeding from needle site Infection (rare, may require  surgery) Nerve injury (rare) Numbness & tingling (temporary) Difficulty urinating (rare, temporary) Spinal headache (a headache worse with upright posture) Light-headedness (temporary) Pain at injection site (serveral days) Decreased blood pressure (rare, temporary) Weakness in arm/leg (temporary) Pressure sensation in back/neck (temporary)   Call if you experience:  Fever/chills associated with headache or increased back/neck pain Headache worsened by an upright position New onset, weakness or numbness of an extremity below the injection site Hives or difficulty breathing (go to the emergency room) Inflammation or drainage at the injection site(s) Severe back/neck pain greater than usual New symptoms which are concerning to you  Please note:  Although the local anesthetic injected can often make your back or neck feel good for several hours after the injection, the pain will likely return. It takes 3-7 days for steroids to work.  You may not notice any pain relief for at least one week.  If effective, we will often do a series of 2-3 injections spaced 3-6 weeks apart to maximally decrease your pain.  After the initial series, you may be a candidate for a more permanent nerve block of the facets.  If you have any questions, please call #[phone] [facility]

## 2022-05-19 NOTE — Progress Notes (Signed)
Safety precautions to be maintained throughout the outpatient stay will include: orient to surroundings, keep bed in low position, maintain call bell within reach at all times, provide assistance with transfer out of bed and ambulation.  

## 2022-05-20 ENCOUNTER — Telehealth: Payer: Self-pay

## 2022-05-20 NOTE — Telephone Encounter (Signed)
Post pump refill follow up.  LM

## 2022-05-22 NOTE — Progress Notes (Deleted)
PROVIDER NOTE: Interpretation of information contained herein should be left to medically-trained personnel. Specific patient instructions are provided elsewhere under "Patient Instructions" section of medical record. This document was created in part using STT-dictation technology, any transcriptional errors that may result from this process are unintentional.  Patient: Tiffany Hunter Type: Established DOB: 07/12/1945 MRN: [id number] PCP: Iva Lento, PA-C  Service: Procedure DOS: 05/26/2022 Setting: Ambulatory Location: Ambulatory outpatient facility Delivery: Face-to-face Provider: Gaspar Cola, MD Specialty: Interventional Pain Management Specialty designation: 09 Location: Outpatient facility Ref. Prov.: Iva Lento, PA-C    Procedure:           Type: Lumbar Facet, Medial Branch Block(s) #3  Laterality: Bilateral  Level: L2, L3, L4, L5, and S1 Medial Branch Level(s). Injecting these levels blocks the L3-4, L4-5, and L5-S1 lumbar facet joints. Imaging: Fluoroscopic guidance         Anesthesia: Local anesthesia (1-2% Lidocaine) Anxiolysis: IV Versed         Sedation:                         DOS: 05/26/2022 Performed by: Gaspar Cola, MD  Primary Purpose: Diagnostic/Therapeutic Indications: Low back pain severe enough to impact quality of life or function. No diagnosis found. NAS-11 Pain score:   Pre-procedure:  /10   Post-procedure:  /10     Position / Prep / Materials:  Position: Prone  Prep solution: DuraPrep (Iodine Povacrylex [0.7% available iodine] and Isopropyl Alcohol, 74% w/w) Area Prepped: Posterolateral Lumbosacral Spine (Wide prep: From the lower border of the scapula down to the end of the tailbone and from flank to flank.)  Materials:  Tray: Block Needle(s):  Type: Spinal  Gauge (G): 22  Length: 5-in Qty: 4     Pre-op H&P Assessment:  [name] is a 77 y.o. (year old), female patient, seen today for interventional treatment.  She  has a past surgical history that includes Carpal tunnel release (Bilateral); Back surgery; Breast surgery (Bilateral); Cholecystectomy; Colonoscopy w/ polypectomy; Cataract extraction w/ intraocular lens implant (Right); Abdominal hysterectomy; Fracture surgery (Right); Cardiac catheterization (2012); and Pain pump revision (Left, 07/07/2017). [name] has a current medication list which includes the following prescription(s): albuterol, atorvastatin, carvedilol, celecoxib, diclofenac sodium, diethylpropion hcl cr, losartan, meloxicam, naloxegol oxalate, NON FORMULARY, omeprazole, oxybutynin, and ssd. Her primarily concern today is the No chief complaint on file.  Initial Vital Signs:  Pulse/HCG Rate:    Temp:   Resp:   BP:   SpO2:    BMI: Estimated body mass index is 30.6 kg/m as calculated from the following:   Height as of 05/19/22: 5' 2.5" (1.588 m).   Weight as of 05/19/22: 170 lb (77.1 kg).  Risk Assessment: Allergies: Reviewed. She is allergic to doxycycline hyclate and sulfa antibiotics.  Allergy Precautions: None required Coagulopathies: Reviewed. None identified.  Blood-thinner therapy: None at this time Active Infection(s): Reviewed. None identified. [name] is afebrile  Site Confirmation: [name] was asked to confirm the procedure and laterality before marking the site Procedure checklist: Completed Consent: Before the procedure and under the influence of no sedative(s), amnesic(s), or anxiolytics, the patient was informed of the treatment options, risks and possible complications. To fulfill our ethical and legal obligations, as recommended by the American Medical Association's Code of Ethics, I have informed the patient of my clinical impression; the nature and purpose of the treatment or procedure; the risks, benefits, and possible complications of the intervention; the alternatives, including  doing nothing; the risk(s) and benefit(s) of the alternative  treatment(s) or procedure(s); and the risk(s) and benefit(s) of doing nothing. The patient was provided information about the general risks and possible complications associated with the procedure. These may include, but are not limited to: failure to achieve desired goals, infection, bleeding, organ or nerve damage, allergic reactions, paralysis, and death. In addition, the patient was informed of those risks and complications associated to Spine-related procedures, such as failure to decrease pain; infection (i.e.: Meningitis, epidural or intraspinal abscess); bleeding (i.e.: epidural hematoma, subarachnoid hemorrhage, or any other type of intraspinal or peri-dural bleeding); organ or nerve damage (i.e.: Any type of peripheral nerve, nerve root, or spinal cord injury) with subsequent damage to sensory, motor, and/or autonomic systems, resulting in permanent pain, numbness, and/or weakness of one or several areas of the body; allergic reactions; (i.e.: anaphylactic reaction); and/or death. Furthermore, the patient was informed of those risks and complications associated with the medications. These include, but are not limited to: allergic reactions (i.e.: anaphylactic or anaphylactoid reaction(s)); adrenal axis suppression; blood sugar elevation that in diabetics may result in ketoacidosis or comma; water retention that in patients with history of congestive heart failure may result in shortness of breath, pulmonary edema, and decompensation with resultant heart failure; weight gain; swelling or edema; medication-induced neural toxicity; particulate matter embolism and blood vessel occlusion with resultant organ, and/or nervous system infarction; and/or aseptic necrosis of one or more joints. Finally, the patient was informed that Medicine is not an exact science; therefore, there is also the possibility of unforeseen or unpredictable risks and/or possible complications that may result in a catastrophic  outcome. The patient indicated having understood very clearly. We have given the patient no guarantees and we have made no promises. Enough time was given to the patient to ask questions, all of which were answered to the patient's satisfaction. Ms. Messimer has indicated that she wanted to continue with the procedure. Attestation: I, the ordering provider, attest that I have discussed with the patient the benefits, risks, side-effects, alternatives, likelihood of achieving goals, and potential problems during recovery for the procedure that I have provided informed consent. Date  Time: {CHL [facility]-PAIN TIME CHOICES:[id number]}  Pre-Procedure Preparation:  Monitoring: As per clinic protocol. Respiration, ETCO2, SpO2, BP, heart rate and rhythm monitor placed and checked for adequate function Safety Precautions: Patient was assessed for positional comfort and pressure points before starting the procedure. Time-out: I initiated and conducted the "Time-out" before starting the procedure, as per protocol. The patient was asked to participate by confirming the accuracy of the "Time Out" information. Verification of the correct person, site, and procedure were performed and confirmed by me, the nursing staff, and the patient. "Time-out" conducted as per Joint Commission's Universal Protocol (UP.01.01.01). Time:    Description of Procedure:          Laterality: Bilateral. The procedure was performed in identical fashion on both sides. Targeted Levels: L2, L3, L4, L5, and S1  Medial Branch Level(s)  Safety Precautions: Aspiration looking for blood return was conducted prior to all injections. At no point did we inject any substances, as a needle was being advanced. Before injecting, the patient was told to immediately notify me if she was experiencing any new onset of "ringing in the ears, or metallic taste in the mouth". No attempts were made at seeking any paresthesias. Safe injection practices and needle  disposal techniques used. Medications properly checked for expiration dates. SDV (single dose vial) medications used. After  the completion of the procedure, all disposable equipment used was discarded in the proper designated medical waste containers. Local Anesthesia: Protocol guidelines were followed. The patient was positioned over the fluoroscopy table. The area was prepped in the usual manner. The time-out was completed. The target area was identified using fluoroscopy. A 12-in long, straight, sterile hemostat was used with fluoroscopic guidance to locate the targets for each level blocked. Once located, the skin was marked with an approved surgical skin marker. Once all sites were marked, the skin (epidermis, dermis, and hypodermis), as well as deeper tissues (fat, connective tissue and muscle) were infiltrated with a small amount of a short-acting local anesthetic, loaded on a 10cc syringe with a 25G, 1.5-in  Needle. An appropriate amount of time was allowed for local anesthetics to take effect before proceeding to the next step. Local Anesthetic: Lidocaine 2.0% The unused portion of the local anesthetic was discarded in the proper designated containers. Technical description of process:  L2 Medial Branch Nerve Block (MBB): The target area for the L2 medial branch is at the junction of the postero-lateral aspect of the superior articular process and the superior, posterior, and medial edge of the transverse process of L3. Under fluoroscopic guidance, a Quincke needle was inserted until contact was made with os over the superior postero-lateral aspect of the pedicular shadow (target area). After negative aspiration for blood, 0.5 mL of the nerve block solution was injected without difficulty or complication. The needle was removed intact. L3 Medial Branch Nerve Block (MBB): The target area for the L3 medial branch is at the junction of the postero-lateral aspect of the superior articular process and the  superior, posterior, and medial edge of the transverse process of L4. Under fluoroscopic guidance, a Quincke needle was inserted until contact was made with os over the superior postero-lateral aspect of the pedicular shadow (target area). After negative aspiration for blood, 0.5 mL of the nerve block solution was injected without difficulty or complication. The needle was removed intact. L4 Medial Branch Nerve Block (MBB): The target area for the L4 medial branch is at the junction of the postero-lateral aspect of the superior articular process and the superior, posterior, and medial edge of the transverse process of L5. Under fluoroscopic guidance, a Quincke needle was inserted until contact was made with os over the superior postero-lateral aspect of the pedicular shadow (target area). After negative aspiration for blood, 0.5 mL of the nerve block solution was injected without difficulty or complication. The needle was removed intact. L5 Medial Branch Nerve Block (MBB): The target area for the L5 medial branch is at the junction of the postero-lateral aspect of the superior articular process and the superior, posterior, and medial edge of the sacral ala. Under fluoroscopic guidance, a Quincke needle was inserted until contact was made with os over the superior postero-lateral aspect of the pedicular shadow (target area). After negative aspiration for blood, 0.5 mL of the nerve block solution was injected without difficulty or complication. The needle was removed intact. S1 Medial Branch Nerve Block (MBB): The target area for the S1 medial branch is at the posterior and inferior 6 o'clock position of the L5-S1 facet joint. Under fluoroscopic guidance, the Quincke needle inserted for the L5 MBB was redirected until contact was made with os over the inferior and postero aspect of the sacrum, at the 6 o' clock position under the L5-S1 facet joint (Target area). After negative aspiration for blood, 0.5 mL of the  nerve block solution  was injected without difficulty or complication. The needle was removed intact.  Once the entire procedure was completed, the treated area was cleaned, making sure to leave some of the prepping solution back to take advantage of its long term bactericidal properties.         Illustration of the posterior view of the lumbar spine and the posterior neural structures. Laminae of L2 through S1 are labeled. DPRL5, dorsal primary ramus of L5; DPRS1, dorsal primary ramus of S1; DPR3, dorsal primary ramus of L3; FJ, facet (zygapophyseal) joint L3-L4; I, inferior articular process of L4; LB1, lateral branch of dorsal primary ramus of L1; IAB, inferior articular branches from L3 medial branch (supplies L4-L5 facet joint); IBP, intermediate branch plexus; MB3, medial branch of dorsal primary ramus of L3; NR3, third lumbar nerve root; S, superior articular process of L5; SAB, superior articular branches from L4 (supplies L4-5 facet joint also); TP3, transverse process of L3.  There were no vitals filed for this visit.   Start Time:   hrs. End Time:   hrs.  Imaging Guidance (Spinal):          Type of Imaging Technique: Fluoroscopy Guidance (Spinal) Indication(s): Assistance in needle guidance and placement for procedures requiring needle placement in or near specific anatomical locations not easily accessible without such assistance. Exposure Time: Please see nurses notes. Contrast: None used. Fluoroscopic Guidance: I was personally present during the use of fluoroscopy. "Tunnel Vision Technique" used to obtain the best possible view of the target area. Parallax error corrected before commencing the procedure. "Direction-depth-direction" technique used to introduce the needle under continuous pulsed fluoroscopy. Once target was reached, antero-posterior, oblique, and lateral fluoroscopic projection used confirm needle placement in all planes. Images permanently stored in  EMR. Interpretation: No contrast injected. I personally interpreted the imaging intraoperatively. Adequate needle placement confirmed in multiple planes. Permanent images saved into the patient's record.  Antibiotic Prophylaxis:   Anti-infectives (From admission, onward)    None      Indication(s): None identified  Post-operative Assessment:  Post-procedure Vital Signs:  Pulse/HCG Rate:    Temp:   Resp:   BP:   SpO2:    EBL: None  Complications: No immediate post-treatment complications observed by team, or reported by patient.  Note: The patient tolerated the entire procedure well. A repeat set of vitals were taken after the procedure and the patient was kept under observation following institutional policy, for this type of procedure. Post-procedural neurological assessment was performed, showing return to baseline, prior to discharge. The patient was provided with post-procedure discharge instructions, including a section on how to identify potential problems. Should any problems arise concerning this procedure, the patient was given instructions to immediately contact us, at any time, without hesitation. In any case, we plan to contact the patient by telephone for a follow-up status report regarding this interventional procedure.  Comments:  No additional relevant information.  Plan of Care  Orders:  No orders of the defined types were placed in this encounter.  Chronic Opioid Analgesic:  No other oral opioid analgesics prescribed by our practice. Intrathecal PF-Hydromorphone 3.9295 mg/day + PF-Sufentanil 34.383 mcg/day. MME: Aprox. 58.7 mg/day.   Medications ordered for procedure: No orders of the defined types were placed in this encounter.  Medications administered: [name] had no medications administered during this visit.  See the medical record for exact dosing, route, and time of administration.  Follow-up plan:   No follow-ups on file.  Interventional Therapies  Risk  Complexity Considerations:   Estimated body mass index is 32.4 kg/m as calculated from the following:   Height as of this encounter: 5' 2.5" (1.588 m).   Weight as of this encounter: 180 lb (81.6 kg). Presence of intrathecal pump  DM  CAD  Pulmonary hypertension    Planned  Pending:   Diagnostic/therapeutic bilateral lumbar facet MBB L3R1  Palliative intrathecal pump refills    Under consideration:   Diagnostic left lumbar facet MBB #3  Possible left lumbar facet RFA #1   Completed:   Diagnostic/therapeutic right caudal ESI x1 (01/04/2022) (100/100/100/100) (of right leg pain)  Diagnostic left lumbar facet MBB x2 (12/22/2016) (07/14/2020) (100/100/100/100) (of left low back pain)    Therapeutic  Palliative (PRN) options:   Therapeutic/palliative left sided lumbar facet MBB #3        Recent Visits Date Type Provider Dept  05/19/22 Procedure visit Milinda Pointer, MD [facility]  03/09/22 Procedure visit Milinda Pointer, MD [facility]  Showing recent visits within past 90 days and meeting all other requirements Future Appointments Date Type Provider Dept  05/26/22 Appointment Milinda Pointer, [facility]  07/26/22 Appointment Milinda Pointer, MD [facility]  Showing future appointments within next 90 days and meeting all other requirements  Disposition: Discharge home  Discharge (Date  Time): 05/26/2022;   hrs.   Primary Care Physician: Iva Lento, PA-C Location: [facility] Note by: Gaspar Cola, MD Date: 05/26/2022; Time: 6:38 PM  Disclaimer:  Medicine is not an Chief Strategy Officer. The only guarantee in medicine is that nothing is guaranteed. It is important to note that the decision to proceed with this intervention was based on the information collected from the patient. The Data and conclusions were drawn from the patient's questionnaire, the  interview, and the physical examination. Because the information was provided in large part by the patient, it cannot be guaranteed that it has not been purposely or unconsciously manipulated. Every effort has been made to obtain as much relevant data as possible for this evaluation. It is important to note that the conclusions that lead to this procedure are derived in large part from the available data. Always take into account that the treatment will also be dependent on availability of resources and existing treatment guidelines, considered by other Pain Management Practitioners as being common knowledge and practice, at the time of the intervention. For Medico-Legal purposes, it is also important to point out that variation in procedural techniques and pharmacological choices are the acceptable norm. The indications, contraindications, technique, and results of the above procedure should only be interpreted and judged by a Board-Certified Interventional Pain Specialist with extensive familiarity and expertise in the same exact procedure and technique.

## 2022-05-24 ENCOUNTER — Other Ambulatory Visit: Payer: Self-pay | Admitting: Pain Medicine

## 2022-05-24 NOTE — Addendum Note (Signed)
Addended by: Milinda Pointer A on: 05/24/2022 01:00 PM   Modules accepted: Orders

## 2022-05-25 MED FILL — Medication: INTRATHECAL | Qty: 1 | Status: AC

## 2022-05-26 ENCOUNTER — Ambulatory Visit: Payer: Medicare Other | Admitting: Pain Medicine

## 2022-06-29 ENCOUNTER — Other Ambulatory Visit: Payer: Self-pay

## 2022-06-29 MED ORDER — PAIN MANAGEMENT IT PUMP REFILL: 1.0000 | Freq: Once | INTRATHECAL | 0 refills | Status: AC

## 2022-07-26 ENCOUNTER — Ambulatory Visit: Payer: Medicare Other | Attending: Pain Medicine | Admitting: Pain Medicine

## 2022-07-26 ENCOUNTER — Encounter: Payer: Self-pay | Admitting: Pain Medicine

## 2022-07-26 VITALS — BP 150/85 | HR 86 | Temp 97.5°F | Ht 62.0 in | Wt 170.0 lb

## 2022-07-26 DIAGNOSIS — G8929 Other chronic pain: Secondary | ICD-10-CM | POA: Diagnosis present

## 2022-07-26 DIAGNOSIS — Z79899 Other long term (current) drug therapy: Secondary | ICD-10-CM | POA: Diagnosis present

## 2022-07-26 DIAGNOSIS — T402X5A Adverse effect of other opioids, initial encounter: Secondary | ICD-10-CM

## 2022-07-26 DIAGNOSIS — G894 Chronic pain syndrome: Secondary | ICD-10-CM

## 2022-07-26 DIAGNOSIS — M5416 Radiculopathy, lumbar region: Secondary | ICD-10-CM | POA: Diagnosis present

## 2022-07-26 DIAGNOSIS — M5137 Other intervertebral disc degeneration, lumbosacral region: Secondary | ICD-10-CM | POA: Diagnosis present

## 2022-07-26 DIAGNOSIS — Z451 Encounter for adjustment and management of infusion pump: Secondary | ICD-10-CM | POA: Diagnosis present

## 2022-07-26 DIAGNOSIS — M961 Postlaminectomy syndrome, not elsewhere classified: Secondary | ICD-10-CM | POA: Diagnosis present

## 2022-07-26 DIAGNOSIS — M533 Sacrococcygeal disorders, not elsewhere classified: Secondary | ICD-10-CM | POA: Insufficient documentation

## 2022-07-26 DIAGNOSIS — Z79891 Long term (current) use of opiate analgesic: Secondary | ICD-10-CM | POA: Diagnosis present

## 2022-07-26 DIAGNOSIS — Z978 Presence of other specified devices: Secondary | ICD-10-CM

## 2022-07-26 DIAGNOSIS — K5903 Drug induced constipation: Secondary | ICD-10-CM | POA: Insufficient documentation

## 2022-07-26 DIAGNOSIS — M47816 Spondylosis without myelopathy or radiculopathy, lumbar region: Secondary | ICD-10-CM | POA: Diagnosis present

## 2022-07-26 DIAGNOSIS — M79605 Pain in left leg: Secondary | ICD-10-CM | POA: Diagnosis present

## 2022-07-26 DIAGNOSIS — M5442 Lumbago with sciatica, left side: Secondary | ICD-10-CM | POA: Diagnosis present

## 2022-07-26 DIAGNOSIS — M51379 Other intervertebral disc degeneration, lumbosacral region without mention of lumbar back pain or lower extremity pain: Secondary | ICD-10-CM

## 2022-07-26 MED ORDER — NALOXONE HCL 4 MG/0.1ML NA LIQD: 1.0000 | NASAL | 0 refills | Status: DC | PRN

## 2022-07-26 NOTE — Progress Notes (Signed)
PROVIDER NOTE: Interpretation of information contained herein should be left to medically-trained personnel. Specific patient instructions are provided elsewhere under "Patient Instructions" section of medical record. This document was created in part using STT-dictation technology, any transcriptional errors that may result from this process are unintentional.  Patient: Tiffany Hunter Type: Established DOB: 15-Jun-1945 MRN: [id number] PCP: Iva Lento, PA-C  Service: Procedure DOS: 07/26/2022 Setting: Ambulatory Location: Ambulatory outpatient facility Delivery: Face-to-face Provider: Gaspar Cola, MD Specialty: Interventional Pain Management Specialty designation: 09 Location: Outpatient facility Ref. Prov.: Iva Lento, PA-C       Interventional Therapy   Primary Reason for Visit: Interventional Pain Management Treatment. CC: Shoulder Pain (left)  Procedure:          Type: Management of Intrathecal Drug Delivery System (IDDS) - Reservoir Refill [phone]). No rate change.  Indications: 1. Chronic pain syndrome   2. 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 Lumbar facet joint syndrome (Left)   4. DDD (degenerative disc disease), lumbosacral   5. Chronic lower extremity pain (2ry area of Pain) (Left)   6. 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 Pharmacologic therapy   10. Chronic use of opiate for therapeutic purpose   11. Long term current use of opiate analgesic   12. Opioid-induced constipation (OIC)   13. 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 Encounter for adjustment or management of infusion pump   15. Encounter for chronic pain management    Pain Assessment: Self-Reported Pain Score: 8 /10             Reported level is compatible with observation.        Note:  The patient indicates that her husband recently passed away due to cancer.  Apparently he was unaware that he had an once he was informed, he survived for only 7 days.  We have given the patient our condolences.   Intrathecal Drug Delivery System (IDDS)  Pump Device:  Manufacturer: Medtronic Model: Synchromed II Model No.: [id number] Serial No.: [id number] Delivery Route: Intrathecal Type: Programmable  Volume (mL): 40 mL reservoir Priming Volume: N/A  Calibration Constant: 114.0  MRI compatibility: Conditional   Implant Details:  Date: 07/07/2017 Implanter: Roderic Palau, MD Contact Information: [facility] [phone] Last Revision/Replacement: 07/07/2017 Estimated Replacement Date: Nov/2025  Implant Site: Abdominal Laterality: Left  Catheter: Manufacturer: Medtronic Model:  Intrathecal catheter Model No.: 8731  Serial No.: n/a  Implanted Length (cm): 28.9  Catheter Volume (mL): 0.209  Tip Location (Level): T11 (Left) Canal Access Site: L2-3  Drug content:  Primary Medication Class: Opioid  Medication: PF-Hydromorphone (Dilaudid)  Concentration: 8 mg/mL   Secondary Medication Class: Opioid  Medication: PF-Sufentanil  Concentration: 70 mcg/mL   Tertiary Medication Class: none  Medication: none   PA parameters (PCA-mode):  Mode: Off (Inactive)  Programming:  Type: Simple continuous.  Medication, Concentration, Infusion Program, & Delivery Rate: For up-to-date details please see most recent scanned programming printout.   Intrathecal Pump Therapy Assessment  Manufacturer: Medtronic Synchromed Type: Programmable Volume: 40 mL reservoir MRI compatibility: Yes    Drug content:  Primary Medication Class: Opioid Primary Medication: PF-Hydromorphone (Dilaudid) (8 mg/mL)  Secondary Medication: PF-Sufentanil (70 g/mL)  Other Medication: No third    Programming:  Type: Simple continuous. See pump readout for details.  Changes:  Medication  Change: None at this point Rate Change: No change in rate  Reported side-effects or adverse reactions: None reported  Effectiveness: Described as relatively effective, allowing for increase in activities of daily living (ADL) Clinically meaningful improvement in function (CMIF): Sustained CMIF goals met  Plan: Pump refill today   Pharmacotherapy Assessment   Opioid Analgesic: No other oral opioid analgesics prescribed by our practice. Intrathecal PF-Hydromorphone 3.9295 mg/day + PF-Sufentanil 34.383 mcg/day. MME: Aprox. 58.7 mg/day.   Monitoring: [place] PMP: PDMP reviewed during this encounter.       Pharmacotherapy: No side-effects or adverse reactions reported. Compliance: No problems identified. Effectiveness: Clinically acceptable. Plan: Refer to "POC". UDS:  Summary  Date Value Ref Range Status  07/30/2019 Note  Final    Comment:    ==================================================================== Compliance Drug Analysis, Ur ==================================================================== Test                             Result       Flag       Units Drug Present not Declared for Prescription Verification   Hydromorphone                  1757         UNEXPECTED ng/mg creat    Hydromorphone may be administered as a scheduled prescription    medication; it is also an expected metabolite of hydrocodone.   Naproxen                       PRESENT      UNEXPECTED Drug Absent but Declared for Prescription Verification   Salicylate                     Not Detected UNEXPECTED    Aspirin, as indicated in the declared medication list, is not always    detected even when used as directed. ==================================================================== Test                      Result    Flag   Units      Ref Range   Creatinine              182              mg/dL      >=20 ==================================================================== Declared Medications:  The  flagging and interpretation on this report are based on the  following declared medications.  Unexpected results may arise from  inaccuracies in the declared medications.  **Note: The testing scope of this panel does not include small to  moderate amounts of these reported medications:  Aspirin  **Note: The testing scope of this panel does not include the  following reported medications:  Albuterol (Ventolin HFA)  Atorvastatin (Lipitor)  Betamethasone (Lotrisone)  Carvedilol (Coreg)  Cholecalciferol  Clotrimazole (Lotrisone)  Hydrocortisone  Mirabegron (Myrbetriq)  Naloxegol (Movantik)  Oxybutynin (Ditropan)  Supplement ==================================================================== For clinical consultation, please call [phone]. ====================================================================    No results found for: "CBDTHCR", "D8THCCBX", "D9THCCBX"   Pre-op H&P Assessment:  Ms. Dibler is a 77 y.o. (year old), female patient, seen today for interventional treatment. She  has a past surgical history that includes Carpal tunnel release (Bilateral); Back surgery; Breast surgery (Bilateral); Cholecystectomy; Colonoscopy w/ polypectomy; Cataract extraction w/ intraocular lens implant (Right); Abdominal hysterectomy; Fracture surgery (Right); Cardiac catheterization (2012); and Pain pump revision (Left, 07/07/2017). Ms. Vanderaa has a  current medication list which includes the following prescription(s): atorvastatin, carvedilol, celecoxib, diethylpropion hcl cr, losartan, meloxicam, naloxone, NON FORMULARY, omeprazole, oxybutynin, ssd, albuterol, and naloxegol oxalate. Her primarily concern today is the Shoulder Pain (left)  Initial Vital Signs:  Pulse/HCG Rate: 86  Temp: (!) 97.5 F (36.4 C) Resp:   BP: (!) 150/85 SpO2: (!) 16 %  BMI: Estimated body mass index is 31.09 kg/m as calculated from the following:   Height as of this encounter: '5\' 2"'$  (1.575 m).   Weight  as of this encounter: 170 lb (77.1 kg).  Risk Assessment: Allergies: Reviewed. She is allergic to doxycycline hyclate and sulfa antibiotics.  Allergy Precautions: None required Coagulopathies: Reviewed. None identified.  Blood-thinner therapy: None at this time Active Infection(s): Reviewed. None identified. Ms. Pete is afebrile  Site Confirmation: Ms. Varriale was asked to confirm the procedure and laterality before marking the site Procedure checklist: Completed Consent: Before the procedure and under the influence of no sedative(s), amnesic(s), or anxiolytics, the patient was informed of the treatment options, risks and possible complications. To fulfill our ethical and legal obligations, as recommended by the American Medical Association's Code of Ethics, I have informed the patient of my clinical impression; the nature and purpose of the treatment or procedure; the risks, benefits, and possible complications of the intervention; the alternatives, including doing nothing; the risk(s) and benefit(s) of the alternative treatment(s) or procedure(s); and the risk(s) and benefit(s) of doing nothing.  Ms. Naegle was provided with information about the general risks and possible complications associated with most interventional procedures. These include, but are not limited to: failure to achieve desired goals, infection, bleeding, organ or nerve damage, allergic reactions, paralysis, and/or death.  In addition, she was informed of those risks and possible complications associated to this particular procedure, which include, but are not limited to: damage to the implant; failure to decrease pain; local, systemic, or serious CNS infections, intraspinal abscess with possible cord compression and paralysis, or life-threatening such as meningitis; bleeding; organ damage; nerve injury or damage with subsequent sensory, motor, and/or autonomic system dysfunction, resulting in transient or permanent pain,  numbness, and/or weakness of one or several areas of the body; allergic reactions, either minor or major life-threatening, such as anaphylactic or anaphylactoid reactions.  Furthermore, Ms. Hennessee was informed of those risks and complications associated with the medications. These include, but are not limited to: allergic reactions (i.e.: anaphylactic or anaphylactoid reactions); endorphine suppression; bradycardia and/or hypotension; water retention and/or peripheral vascular relaxation leading to lower extremity edema and possible stasis ulcers; respiratory depression and/or shortness of breath; decreased metabolic rate leading to weight gain; swelling or edema; medication-induced neural toxicity; particulate matter embolism and blood vessel occlusion with resultant organ, and/or nervous system infarction; and/or intrathecal granuloma formation with possible spinal cord compression and permanent paralysis.  Before refilling the pump Ms. Teigen was informed that some of the medications used in the devise may not be FDA approved for such use and therefore it constitutes an off-label use of the medications.  Finally, she was informed that Medicine is not an exact science; therefore, there is also the possibility of unforeseen or unpredictable risks and/or possible complications that may result in a catastrophic outcome. The patient indicated having understood very clearly. We have given the patient no guarantees and we have made no promises. Enough time was given to the patient to ask questions, all of which were answered to the patient's satisfaction. Ms. Deruyter has indicated that she wanted to continue with  the procedure. Attestation: I, the ordering provider, attest that I have discussed with the patient the benefits, risks, side-effects, alternatives, likelihood of achieving goals, and potential problems during recovery for the procedure that I have provided informed consent. Date  Time: 07/26/2022   1:00 PM  Pre-Procedure Preparation:  Monitoring: As per clinic protocol. Respiration, ETCO2, SpO2, BP, heart rate and rhythm monitor placed and checked for adequate function Safety Precautions: Patient was assessed for positional comfort and pressure points before starting the procedure. Time-out: I initiated and conducted the "Time-out" before starting the procedure, as per protocol. The patient was asked to participate by confirming the accuracy of the "Time Out" information. Verification of the correct person, site, and procedure were performed and confirmed by me, the nursing staff, and the patient. "Time-out" conducted as per Joint Commission's Universal Protocol (UP.01.01.01). Time: 1301  Description of Procedure:          Position: Supine Target Area: Central-port of intrathecal pump. Approach: Anterior, 90 degree angle approach. Area Prepped: Entire Area around the pump implant. DuraPrep (Iodine Povacrylex [0.7% available iodine] and Isopropyl Alcohol, 74% w/w) Safety Precautions: Aspiration looking for blood return was conducted prior to all injections. At no point did we inject any substances, as a needle was being advanced. No attempts were made at seeking any paresthesias. Safe injection practices and needle disposal techniques used. Medications properly checked for expiration dates. SDV (single dose vial) medications used. Description of the Procedure: Protocol guidelines were followed. Two nurses trained to do implant refills were present during the entire procedure. The refill medication was checked by both healthcare providers as well as the patient. The patient was included in the "Time-out" to verify the medication. The patient was placed in position. The pump was identified. The area was prepped in the usual manner. The sterile template was positioned over the pump, making sure the side-port location matched that of the pump. Both, the pump and the template were held for stability.  The needle provided in the Medtronic Kit was then introduced thru the center of the template and into the central port. The pump content was aspirated and discarded volume documented. The new medication was slowly infused into the pump, thru the filter, making sure to avoid overpressure of the device. The needle was then removed and the area cleansed, making sure to leave some of the prepping solution back to take advantage of its long term bactericidal properties. The pump was interrogated and programmed to reflect the correct medication, volume, and dosage. The program was printed and taken to the physician for approval. Once checked and signed by the physician, a copy was provided to the patient and another scanned into the EMR.  Vitals:   07/26/22 1255  BP: (!) 150/85  Pulse: 86  Temp: (!) 97.5 F (36.4 C)  TempSrc: Temporal  SpO2: (!) 16%  Weight: 170 lb (77.1 kg)  Height: '5\' 2"'$  (1.575 m)    Start Time: 1304 hrs. End Time:   hrs. Materials & Medications: Medtronic Refill Kit Medication(s): Please see chart orders for details.  Imaging Guidance:          Type of Imaging Technique: None used Indication(s): N/A Exposure Time: No patient exposure Contrast: None used. Fluoroscopic Guidance: N/A Ultrasound Guidance: N/A Interpretation: N/A  Antibiotic Prophylaxis:   Anti-infectives (From admission, onward)    None      Indication(s): None identified  Post-operative Assessment:  Post-procedure Vital Signs:  Pulse/HCG Rate: 86  Temp: (!) 97.5 F (36.4  C) Resp:   BP: (!) 150/85 SpO2: (!) 16 %  EBL: None  Complications: No immediate post-treatment complications observed by team, or reported by patient.  Note: The patient tolerated the entire procedure well. A repeat set of vitals were taken after the procedure and the patient was kept under observation following institutional policy, for this type of procedure. Post-procedural neurological assessment was performed,  showing return to baseline, prior to discharge. The patient was provided with post-procedure discharge instructions, including a section on how to identify potential problems. Should any problems arise concerning this procedure, the patient was given instructions to immediately contact us, at any time, without hesitation. In any case, we plan to contact the patient by telephone for a follow-up status report regarding this interventional procedure.  Comments:  No additional relevant information.  Plan of Care (POC)  Orders:  Orders Placed This Encounter  Procedures   PUMP REFILL    Maintain Protocol by having two(2) healthcare providers during procedure and programming.    Scheduling Instructions:     Please refill intrathecal pump today.    Order Specific Question:   Where will this procedure be performed?    Answer:   [facility]   PUMP REFILL    Whenever possible schedule on a procedure today.    Standing Status:   Future    Standing Expiration Date:   11/25/2022    Scheduling Instructions:     Please schedule intrathecal pump refill based on pump programming. Avoid schedule intervals of more than 120 days (4 months).    Order Specific Question:   Where will this procedure be performed?    Answer:   [facility]   Informed Consent Details: Physician/Practitioner Attestation; Transcribe to consent form and obtain patient signature    Transcribe to consent form and obtain patient signature.    Order Specific Question:   Physician/Practitioner attestation of informed consent for procedure/surgical case    Answer:   I, the physician/practitioner, attest that I have discussed with the patient the benefits, risks, side effects, alternatives, likelihood of achieving goals and potential problems during recovery for the procedure that I have provided informed consent.    Order Specific Question:   Procedure    Answer:   Intrathecal pump refill    Order Specific Question:    Physician/Practitioner performing the procedure    Answer:   Attending Physician: Kathlen Brunswick. Dossie Arbour, MD & designated trained staff    Order Specific Question:   Indication/Reason    Answer:   Chronic Pain Syndrome (G89.4), presence of an intrathecal pump (Z97.8)   Chronic Opioid Analgesic:  No other oral opioid analgesics prescribed by our practice. Intrathecal PF-Hydromorphone 3.9295 mg/day + PF-Sufentanil 34.383 mcg/day. MME: Aprox. 58.7 mg/day.   Medications ordered for procedure: Meds ordered this encounter  Medications   naloxone (NARCAN) nasal spray 4 mg/0.1 mL    Sig: Place 1 spray into the nose as needed for up to 365 doses (for opioid-induced respiratory depresssion). In case of emergency (overdose), spray once into each nostril. If no response within 3 minutes, repeat application and call [phone].    Dispense:  1 each    Refill:  0    Instruct patient in proper use of device.   Medications administered: [name] had no medications administered during this visit.  See the medical record for exact dosing, route, and time of administration.  Follow-up plan:   Return for Pump Refill (Max:42mo.  Interventional Therapies  Risk  Complexity Considerations:   Presence of intrathecal pump  DM  CAD  Pulmonary hypertension    Planned  Pending:   Diagnostic/therapeutic bilateral lumbar (L4-5, L5-S1) facet MBB L3R1  Palliative intrathecal pump refills    Under consideration:   Diagnostic left lumbar facet MBB #3  Possible left lumbar facet RFA #1   Completed:   Diagnostic/therapeutic right caudal ESI x1 (01/04/2022) (100/100/100/100) (of right leg pain)  Diagnostic left lumbar facet MBB x2 (12/22/2016) (07/14/2020) (100/100/100/100) (of left low back pain)    Therapeutic  Palliative (PRN) options:   Therapeutic/palliative left sided lumbar facet MBB #3         Recent Visits Date Type Provider Dept  05/19/22 Procedure visit Milinda Pointer, MD [facility]  Showing recent visits within past 90 days and meeting all other requirements Today's Visits Date Type Provider Dept  07/26/22 Procedure visit Milinda Pointer, MD [facility]  Showing today's visits and meeting all other requirements Future Appointments No visits were found meeting these conditions. Showing future appointments within next 90 days and meeting all other requirements  Disposition: Discharge home  Discharge (Date  Time): 07/26/2022;   hrs.   Primary Care Physician: Iva Lento, PA-C Location: [facility] Note by: Gaspar Cola, MD (TTS technology used. I apologize for any typographical errors that were not detected and corrected.) Date: 07/26/2022; Time: 1:12 PM  Disclaimer:  Medicine is not an Chief Strategy Officer. The only guarantee in medicine is that nothing is guaranteed. It is important to note that the decision to proceed with this intervention was based on the information collected from the patient. The Data and conclusions were drawn from the patient's questionnaire, the interview, and the physical examination. Because the information was provided in large part by the patient, it cannot be guaranteed that it has not been purposely or unconsciously manipulated. Every effort has been made to obtain as much relevant data as possible for this evaluation. It is important to note that the conclusions that lead to this procedure are derived in large part from the available data. Always take into account that the treatment will also be dependent on availability of resources and existing treatment guidelines, considered by other Pain Management Practitioners as being common knowledge and practice, at the time of the intervention. For Medico-Legal purposes, it is also important to point out that variation in procedural techniques and pharmacological choices are the acceptable norm. The indications, contraindications, technique, and  results of the above procedure should only be interpreted and judged by a Board-Certified Interventional Pain Specialist with extensive familiarity and expertise in the same exact procedure and technique.

## 2022-07-26 NOTE — Progress Notes (Signed)
Safety precautions to be maintained throughout the outpatient stay will include: orient to surroundings, keep bed in low position, maintain call bell within reach at all times, provide assistance with transfer out of bed and ambulation.  

## 2022-07-26 NOTE — Patient Instructions (Addendum)
Opioid Overdose Opioids are drugs that are often used to treat pain. Opioids include illegal drugs, such as heroin, as well as prescription pain medicines, such as codeine, morphine, hydrocodone, and fentanyl. An opioid overdose happens when you take too much of an opioid. An overdose may be intentional or accidental and can happen with any type of opioid. The effects of an overdose can be mild, dangerous, or even deadly. Opioid overdose is a medical emergency. What are the causes? This condition may be caused by: Taking too much of an opioid on purpose. Taking too much of an opioid by accident. Using two or more substances that contain opioids at the same time. Taking an opioid with a substance that affects your heart, breathing, or blood pressure. These include alcohol, tranquilizers, sleeping pills, illegal drugs, and some over-the-counter medicines. This condition may also happen due to an error made by: A health care provider who prescribes a medicine. The pharmacist who fills the prescription. What increases the risk? This condition is more likely in: Children. They may be attracted to colorful pills. Because of a child's small size, even a small amount of a medicine can be dangerous. Older people. They may be taking many different medicines. Older people may have difficulty reading labels or remembering when they last took their medicines. They may also be more sensitive to the effects of opioids. People with chronic medical conditions, especially heart, liver, kidney, or neurological diseases. People who take an opioid for a long period of time. People who take opioids and use illegal drugs, such as heroin, or other substances, such as alcohol. People who: Have a history of drug or alcohol abuse. Have certain mental health conditions. Have a history of previous drug overdoses. People who take opioids that are not prescribed for them. What are the signs or symptoms? Symptoms of this  condition depend on the type of opioid and the amount that was taken. Common symptoms include: Sleepiness or difficulty waking from sleep. Confusion. Slurred speech. Slowed breathing and a slow pulse (bradycardia). Nausea and vomiting. Abnormally small pupils. Signs and symptoms that require emergency treatment include: Cold, clammy, and pale skin. Blue lips and fingernails. Vomiting. Gurgling sounds in the throat. A pulse that is very slow or difficult to detect. Breathing that is very irregular, slow, noisy, or difficult to detect. Inability to respond to speech or be awakened from sleep (stupor). Seizures. How is this diagnosed? This condition is diagnosed based on your symptoms and medical history. It is important to tell your health care provider: About all of the opioids that you took. When you took the opioids. Whether you were drinking alcohol or using marijuana, cocaine, or other drugs. Your health care provider will do a physical exam. This exam may include: Checking and monitoring your heart rate and rhythm, breathing rate, temperature, and blood pressure. Measuring oxygen levels in your blood. Checking for abnormally small pupils. You may also have blood tests or urine tests. You may have X-rays if you are having severe breathing problems. How is this treated? This condition requires immediate medical treatment and hospitalization. Reversing the effects of the opioid is the first step in treatment. If you have a Narcan kit or naloxone, use it right away. Follow your health care provider's instructions. A friend or family member can also help you with this. The rest of your treatment will be given in the hospital intensive care (ICU). Treatment in the hospital may include: Giving salts and minerals (electrolytes) along with fluids through  an IV. Inserting a breathing tube (endotracheal tube) in your airway to help you breathe if you cannot breathe on your own or you are in  danger of not being able to breathe on your own. Giving oxygen through a small tube under your nose. Passing a tube through your nose and into your stomach (nasogastric tube, or NG tube) to empty your stomach. Giving medicines that: Increase your blood pressure. Relieve nausea and vomiting. Relieve abdominal pain and cramping. Reverse the effects of the opioid (naloxone). Monitoring your heart and oxygen levels. Ongoing counseling and mental health support if you intentionally overdosed or used an illegal drug. Follow these instructions at home:  Medicines Take over-the-counter and prescription medicines only as told by your health care provider. Always ask your health care provider about possible side effects and interactions of any new medicine that you start taking. Keep a list of all the medicines that you take, including over-the-counter medicines. Bring this list with you to all your medical visits. General instructions Drink enough fluid to keep your urine pale yellow. Keep all follow-up visits. This is important. How is this prevented? Read the drug inserts that come with your opioid pain medicines. Take medicines only as told by your health care provider. Do not take more medicine than you are told. Do not take medicines more frequently than you are told. Do not drink alcohol or take sedatives when taking opioids. Do not use illegal or recreational drugs, including cocaine, ecstasy, and marijuana. Do not take opioid medicines that are not prescribed for you. Store all medicines in safety containers that are out of the reach of children. Get help if you are struggling with: Alcohol or drug use. Depression or another mental health problem. Thoughts of hurting yourself or another person. Keep the phone number of your local poison control center near your phone or in your mobile phone. In the [place] the hotline of the [facility] is 782-479-6281. If you were  prescribed naloxone, make sure you understand how to take it. Contact a health care provider if: You need help understanding how to take your pain medicines. You feel your medicines are too strong. You are concerned that your pain medicines are not working well for your pain. You develop new symptoms or side effects when you are taking medicines. Get help right away if: You or someone else is having symptoms of an opioid overdose. Get help even if you are not sure. You have thoughts about hurting yourself or others. You have: Chest pain. Difficulty breathing. A loss of consciousness. These symptoms may represent a serious problem that is an emergency. Do not wait to see if the symptoms will go away. Get medical help right away. Call your local emergency services (911 in the [place] Do not drive yourself to the hospital. If you ever feel like you may hurt yourself or others, or have thoughts about taking your own life, get help right away. You can go to your nearest emergency department or: Call your local emergency services (911 in the [place] Call a suicide crisis helpline, such as the [place] at (478)885-9934 or 988 in the [place]. This is open 24 hours a day in the [place] Text the Crisis Text Line at 347-535-9120 (in the [place].). Summary Opioids are drugs that are often used to treat pain. Opioids include illegal drugs, such as heroin, as well as prescription pain medicines. An opioid overdose happens when you take too much of an  opioid. Overdoses can be intentional or accidental. Opioid overdose is very dangerous. It is a life-threatening emergency. If you or someone you know is experiencing an opioid overdose, get help right away. This information is not intended to replace advice given to you by your health care provider. Make sure you discuss any questions you have with your health care provider. Document Revised: 11/25/2020 Document Reviewed: 08/12/2020 Elsevier  Patient Education  [place]. ____________________________________________________________________________________________  Naloxone Nasal Spray  Why am I receiving this medication? [place] STOP ACT requires that all patients taking high dose opioids or at risk of opioids respiratory depression, be prescribed an opioid reversal agent, such as Naloxone (AKA: Narcan).  What is this medication? NALOXONE (nal OX one) treats opioid overdose, which causes slow or shallow breathing, severe drowsiness, or trouble staying awake. Call emergency services after using this medication. You may need additional treatment. Naloxone works by reversing the effects of opioids. It belongs to a group of medications called opioid blockers.  COMMON BRAND NAME(S): Kloxxado, Narcan  What should I tell my care team before I take this medication? They need to know if you have any of these conditions: Heart disease Substance use disorder An unusual or allergic reaction to naloxone, other medications, foods, dyes, or preservatives Pregnant or trying to get pregnant Breast-feeding  When to use this medication? This medication is to be used for the treatment of respiratory depression (less than 8 breaths per minute) secondary to opioid overdose.   How to use this medication? This medication is for use in the nose. Lay the person on their back. Support their neck with your hand and allow the head to tilt back before giving the medication. The nasal spray should be given into 1 nostril. After giving the medication, move the person onto their side. Do not remove or test the nasal spray until ready to use. Get emergency medical help right away after giving the first dose of this medication, even if the person wakes up. You should be familiar with how to recognize the signs and symptoms of a narcotic overdose. If more doses are needed, give the additional dose in the other nostril. Talk to your care team about  the use of this medication in children. While this medication may be prescribed for children as young as newborns for selected conditions, precautions do apply.  Naloxone Overdosage: If you think you have taken too much of this medicine contact a poison control center or emergency room at once.  NOTE: This medicine is only for you. Do not share this medicine with others.  What if I miss a dose? This does not apply.  What may interact with this medication? This is only used during an emergency. No interactions are expected during emergency use. This list may not describe all possible interactions. Give your health care provider a list of all the medicines, herbs, non-prescription drugs, or dietary supplements you use. Also tell them if you smoke, drink alcohol, or use illegal drugs. Some items may interact with your medicine.  What should I watch for while using this medication? Keep this medication ready for use in the case of an opioid overdose. Make sure that you have the phone number of your care team and local hospital ready. You may need to have additional doses of this medication. Each nasal spray contains a single dose. Some emergencies may require additional doses. After use, bring the treated person to the nearest hospital or call 911. Make sure the treating  care team knows that the person has received a dose of this medication. You will receive additional instructions on what to do during and after use of this medication before an emergency occurs.  What side effects may I notice from receiving this medication? Side effects that you should report to your care team as soon as possible: Allergic reactions--skin rash, itching, hives, swelling of the face, lips, tongue, or throat Side effects that usually do not require medical attention (report these to your care team if they continue or are bothersome): Constipation Dryness or irritation inside the nose Headache Increase in blood  pressure Muscle spasms Stuffy nose Toothache This list may not describe all possible side effects. Call your doctor for medical advice about side effects. You may report side effects to FDA at 1-800-FDA-1088.  Where should I keep my medication? Because this is an emergency medication, you should keep it with you at all times.  Keep out of the reach of children and pets. Store between 20 and 25 degrees C (68 and 77 degrees F). Do not freeze. Throw away any unused medication after the expiration date. Keep in original box until ready to use.  NOTE: This sheet is a summary. It may not cover all possible information. If you have questions about this medicine, talk to your doctor, pharmacist, or health care provider.   2023 Elsevier/Gold Standard (2021-01-08 00:00:00)  ____________________________________________________________________________________________

## 2022-07-27 ENCOUNTER — Telehealth: Payer: Self-pay

## 2022-07-27 NOTE — Telephone Encounter (Signed)
Post pump refill follow up.  Patient states she is doing good.  

## 2022-09-07 MED FILL — Medication: INTRATHECAL | Qty: 1 | Status: AC

## 2022-10-13 ENCOUNTER — Other Ambulatory Visit: Payer: Self-pay

## 2022-10-13 MED ORDER — PAIN MANAGEMENT IT PUMP REFILL: 1.0000 | Freq: Once | INTRATHECAL | 0 refills | Status: AC

## 2022-10-25 ENCOUNTER — Telehealth: Payer: Self-pay

## 2022-10-25 ENCOUNTER — Ambulatory Visit: Payer: Medicare Other | Attending: Pain Medicine | Admitting: Pain Medicine

## 2022-10-25 DIAGNOSIS — Z91199 Patient's noncompliance with other medical treatment and regimen due to unspecified reason: Secondary | ICD-10-CM

## 2022-10-25 NOTE — Telephone Encounter (Signed)
Got in touch with patient and she forgot that she had an appointment. She is unable to make it today,. Patient comin 6/12. Medication taken back to pharmacy.

## 2022-10-25 NOTE — Telephone Encounter (Signed)
Called cell phone, no answer, mailbox full. Unable to leave message.

## 2022-10-25 NOTE — Progress Notes (Signed)
(  10/25/2022) the patient did not attend her appointment to refill her intrathecal pump.  When we called her she indicated that she had forgotten.  This is understandable due to the fact that she has a lot going on in her life.  We were informed that the patient's husband recently passed away.  Having said that, the patient also did not attend her 03/08/2022 or 05/07/2020 refill appointments.  The patient has been reminded the importance of keeping these appointments when it comes to an intrathecal pump.

## 2022-10-25 NOTE — Telephone Encounter (Signed)
Called patient to see if she was coming for her appointment. No answer. Left message to call us

## 2022-10-26 ENCOUNTER — Ambulatory Visit: Payer: Medicare Other | Attending: Pain Medicine | Admitting: Pain Medicine

## 2022-10-26 ENCOUNTER — Encounter: Payer: Self-pay | Admitting: Pain Medicine

## 2022-10-26 VITALS — BP 162/76 | HR 82 | Temp 96.9°F | Resp 16 | Ht 60.0 in | Wt 160.0 lb

## 2022-10-26 DIAGNOSIS — M47816 Spondylosis without myelopathy or radiculopathy, lumbar region: Secondary | ICD-10-CM | POA: Insufficient documentation

## 2022-10-26 DIAGNOSIS — M5137 Other intervertebral disc degeneration, lumbosacral region: Secondary | ICD-10-CM | POA: Diagnosis present

## 2022-10-26 DIAGNOSIS — M533 Sacrococcygeal disorders, not elsewhere classified: Secondary | ICD-10-CM | POA: Insufficient documentation

## 2022-10-26 DIAGNOSIS — M5442 Lumbago with sciatica, left side: Secondary | ICD-10-CM | POA: Insufficient documentation

## 2022-10-26 DIAGNOSIS — Z451 Encounter for adjustment and management of infusion pump: Secondary | ICD-10-CM | POA: Insufficient documentation

## 2022-10-26 DIAGNOSIS — M961 Postlaminectomy syndrome, not elsewhere classified: Secondary | ICD-10-CM | POA: Diagnosis present

## 2022-10-26 DIAGNOSIS — M5416 Radiculopathy, lumbar region: Secondary | ICD-10-CM | POA: Insufficient documentation

## 2022-10-26 DIAGNOSIS — Z79891 Long term (current) use of opiate analgesic: Secondary | ICD-10-CM | POA: Diagnosis present

## 2022-10-26 DIAGNOSIS — Z978 Presence of other specified devices: Secondary | ICD-10-CM | POA: Insufficient documentation

## 2022-10-26 DIAGNOSIS — G894 Chronic pain syndrome: Secondary | ICD-10-CM | POA: Diagnosis present

## 2022-10-26 DIAGNOSIS — G8929 Other chronic pain: Secondary | ICD-10-CM | POA: Diagnosis present

## 2022-10-26 DIAGNOSIS — M79605 Pain in left leg: Secondary | ICD-10-CM | POA: Insufficient documentation

## 2022-10-26 NOTE — Patient Instructions (Signed)

## 2022-10-26 NOTE — Progress Notes (Signed)
PROVIDER NOTE: Interpretation of information contained herein should be left to medically-trained personnel. Specific patient instructions are provided elsewhere under "Patient Instructions" section of medical record. This document was created in part using STT-dictation technology, any transcriptional errors that may result from this process are unintentional.  Patient: Tiffany Hunter Type: Established DOB: 1945/11/28 MRN: [id number] PCP: Chauncy Lean, PA-C  Service: Procedure DOS: 10/26/2022 Setting: Ambulatory Location: Ambulatory outpatient facility Delivery: Face-to-face Provider: Oswaldo Done, MD Specialty: Interventional Pain Management Specialty designation: 09 Location: Outpatient facility Ref. Prov.: Chauncy Lean, PA-C       Interventional Therapy   Primary Reason for Visit: Interventional Pain Management Treatment. CC: Arm Pain (right)  Procedure:          Type: Management of Intrathecal Drug Delivery System (IDDS) - Reservoir Refill (40981). No rate change.  Indications: 1. Chronic pain syndrome   2. 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 Chronic lower extremity pain (2ry area of Pain) (Left)   4. DDD (degenerative disc disease), lumbosacral   5. Chronic lumbar radicular pain (L4 Dermatome) (Left)   6. Lumbar facet joint syndrome (Left)   7. 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 Chronic use of opiate for therapeutic purpose   10. 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 Encounter for adjustment or management of infusion pump    Pain Assessment: Self-Reported Pain Score: 7 /10             Reported level is compatible with observation.        The patient's husband recently passed away and she is still in mourning.  Because of everything that has been happening, she did not keep up with her appointment and today  she comes in with an empty pump.    Intrathecal Drug Delivery System (IDDS)  Pump Device:  Manufacturer: Medtronic Model: Synchromed II Model No.: [id number] Serial No.: [id number] Delivery Route: Intrathecal Type: Programmable  Volume (mL): 40 mL reservoir Priming Volume: N/A  Calibration Constant: 114.0  MRI compatibility: Conditional   Implant Details:  Date: 07/07/2017 Implanter: Georges Mouse, MD Contact Information: [facility] [phone] Last Revision/Replacement: 07/07/2017 Estimated Replacement Date: Nov/2025  Implant Site: Abdominal Laterality: Left  Catheter: Manufacturer: Medtronic Model:  Intrathecal catheter Model No.: 8731  Serial No.: n/a  Implanted Length (cm): 28.9  Catheter Volume (mL): 0.209  Tip Location (Level): T11 (Left) Canal Access Site: L2-3  Drug content:  Primary Medication Class: Opioid  Medication: PF-Hydromorphone (Dilaudid)  Concentration: 8 mg/mL   Secondary Medication Class: Opioid  Medication: PF-Sufentanil  Concentration: 70 mcg/mL   Tertiary Medication Class: none  Medication: none   PA parameters (PCA-mode):  Mode: Off (Inactive)  Programming:  Type: Simple continuous.  Medication, Concentration, Infusion Program, & Delivery Rate: For up-to-date details please see most recent scanned programming printout.   Intrathecal Pump Therapy Assessment  Manufacturer: Medtronic Synchromed Type: Programmable Volume: 40 mL reservoir MRI compatibility: Yes    Drug content:  Primary Medication Class: Opioid Primary Medication: PF-Hydromorphone (Dilaudid) (8 mg/mL)  Secondary Medication: PF-Sufentanil (70 g/mL)  Other Medication: No third    Programming:  Type: Simple continuous. See pump readout for details.      Changes:  Medication Change: None at this point Rate Change: No change in rate  Reported side-effects or adverse reactions: None reported  Effectiveness: Described as relatively effective,  allowing for increase in activities of daily living (ADL) Clinically meaningful improvement in function (CMIF): Sustained CMIF goals met  Plan: Pump refill today   Pharmacotherapy Assessment   Opioid Analgesic: No other oral opioid analgesics prescribed by our practice. Intrathecal PF-Hydromorphone 3.9295 mg/day + PF-Sufentanil 34.383 mcg/day. MME: Aprox. 58.7 mg/day.   Monitoring: [place] PMP: PDMP reviewed during this encounter.       Pharmacotherapy: No side-effects or adverse reactions reported. Compliance: No problems identified. Effectiveness: Clinically acceptable. Plan: Refer to "POC". UDS:  Summary  Date Value Ref Range Status  07/30/2019 Note  Final    Comment:    ==================================================================== Compliance Drug Analysis, Ur ==================================================================== Test                             Result       Flag       Units Drug Present not Declared for Prescription Verification   Hydromorphone                  1757         UNEXPECTED ng/mg creat    Hydromorphone may be administered as a scheduled prescription    medication; it is also an expected metabolite of hydrocodone.   Naproxen                       PRESENT      UNEXPECTED Drug Absent but Declared for Prescription Verification   Salicylate                     Not Detected UNEXPECTED    Aspirin, as indicated in the declared medication list, is not always    detected even when used as directed. ==================================================================== Test                      Result    Flag   Units      Ref Range   Creatinine              182              mg/dL      >=54 ==================================================================== Declared Medications:  The flagging and interpretation on this report are based on the  following declared medications.  Unexpected results may arise from  inaccuracies in the declared medications.   **Note: The testing scope of this panel does not include small to  moderate amounts of these reported medications:  Aspirin  **Note: The testing scope of this panel does not include the  following reported medications:  Albuterol (Ventolin HFA)  Atorvastatin (Lipitor)  Betamethasone (Lotrisone)  Carvedilol (Coreg)  Cholecalciferol  Clotrimazole (Lotrisone)  Hydrocortisone  Mirabegron (Myrbetriq)  Naloxegol (Movantik)  Oxybutynin (Ditropan)  Supplement ==================================================================== For clinical consultation, please call [phone]. ====================================================================    No results found for: "CBDTHCR", "D8THCCBX", "D9THCCBX"   Pre-op H&P Assessment:  Tiffany Hunter is a 77 y.o. (year old), female patient, seen today for interventional treatment. She  has a past surgical history that includes Carpal tunnel release (Bilateral); Back surgery; Breast surgery (Bilateral); Cholecystectomy; Colonoscopy w/ polypectomy; Cataract extraction w/ intraocular lens implant (Right); Abdominal hysterectomy; Fracture surgery (Right); Cardiac catheterization (2012); and Pain pump revision (Left, 07/07/2017). Tiffany Hunter has a current medication list which includes the following prescription(s): atorvastatin, carvedilol, celecoxib, diethylpropion hcl cr, losartan, meloxicam, naloxone, NON FORMULARY, omeprazole, oxybutynin, ssd, albuterol, and naloxegol oxalate. Her primarily concern  today is the Arm Pain (right)  Initial Vital Signs:  Pulse/HCG Rate: 82  Temp: (!) 96.9 F (36.1 C) Resp: 16 BP: (!) 162/76 SpO2: 98 %  BMI: Estimated body mass index is 31.25 kg/m as calculated from the following:   Height as of this encounter: 5' (1.524 m).   Weight as of this encounter: 160 lb (72.6 kg).  Risk Assessment: Allergies: Reviewed. She is allergic to doxycycline hyclate and sulfa antibiotics.  Allergy Precautions: None  required Coagulopathies: Reviewed. None identified.  Blood-thinner therapy: None at this time Active Infection(s): Reviewed. None identified. Tiffany Hunter is afebrile  Site Confirmation: Tiffany Hunter was asked to confirm the procedure and laterality before marking the site Procedure checklist: Completed Consent: Before the procedure and under the influence of no sedative(s), amnesic(s), or anxiolytics, the patient was informed of the treatment options, risks and possible complications. To fulfill our ethical and legal obligations, as recommended by the American Medical Association's Code of Ethics, I have informed the patient of my clinical impression; the nature and purpose of the treatment or procedure; the risks, benefits, and possible complications of the intervention; the alternatives, including doing nothing; the risk(s) and benefit(s) of the alternative treatment(s) or procedure(s); and the risk(s) and benefit(s) of doing nothing.  Tiffany Hunter was provided with information about the general risks and possible complications associated with most interventional procedures. These include, but are not limited to: failure to achieve desired goals, infection, bleeding, organ or nerve damage, allergic reactions, paralysis, and/or death.  In addition, she was informed of those risks and possible complications associated to this particular procedure, which include, but are not limited to: damage to the implant; failure to decrease pain; local, systemic, or serious CNS infections, intraspinal abscess with possible cord compression and paralysis, or life-threatening such as meningitis; bleeding; organ damage; nerve injury or damage with subsequent sensory, motor, and/or autonomic system dysfunction, resulting in transient or permanent pain, numbness, and/or weakness of one or several areas of the body; allergic reactions, either minor or major life-threatening, such as anaphylactic or anaphylactoid  reactions.  Furthermore, Tiffany Hunter was informed of those risks and complications associated with the medications. These include, but are not limited to: allergic reactions (i.e.: anaphylactic or anaphylactoid reactions); endorphine suppression; bradycardia and/or hypotension; water retention and/or peripheral vascular relaxation leading to lower extremity edema and possible stasis ulcers; respiratory depression and/or shortness of breath; decreased metabolic rate leading to weight gain; swelling or edema; medication-induced neural toxicity; particulate matter embolism and blood vessel occlusion with resultant organ, and/or nervous system infarction; and/or intrathecal granuloma formation with possible spinal cord compression and permanent paralysis.  Before refilling the pump Tiffany Hunter was informed that some of the medications used in the devise may not be FDA approved for such use and therefore it constitutes an off-label use of the medications.  Finally, she was informed that Medicine is not an exact science; therefore, there is also the possibility of unforeseen or unpredictable risks and/or possible complications that may result in a catastrophic outcome. The patient indicated having understood very clearly. We have given the patient no guarantees and we have made no promises. Enough time was given to the patient to ask questions, all of which were answered to the patient's satisfaction. Tiffany Hunter has indicated that she wanted to continue with the procedure. Attestation: I, the ordering provider, attest that I have discussed with the patient the benefits, risks, side-effects, alternatives, likelihood of achieving goals, and potential problems during recovery for the procedure  that I have provided informed consent. Date  Time: 10/26/2022  1:23 PM  Pre-Procedure Preparation:  Monitoring: As per clinic protocol. Respiration, ETCO2, SpO2, BP, heart rate and rhythm monitor placed and checked for  adequate function Safety Precautions: Patient was assessed for positional comfort and pressure points before starting the procedure. Time-out: I initiated and conducted the "Time-out" before starting the procedure, as per protocol. The patient was asked to participate by confirming the accuracy of the "Time Out" information. Verification of the correct person, site, and procedure were performed and confirmed by me, the nursing staff, and the patient. "Time-out" conducted as per Joint Commission's Universal Protocol (UP.01.01.01). Time: 1335 Start Time: 1335 hrs.  Description of Procedure:          Position: Supine Target Area: Central-port of intrathecal pump. Approach: Anterior, 90 degree angle approach. Area Prepped: Entire Area around the pump implant. DuraPrep (Iodine Povacrylex [0.7% available iodine] and Isopropyl Alcohol, 74% w/w) Safety Precautions: Aspiration looking for blood return was conducted prior to all injections. At no point did we inject any substances, as a needle was being advanced. No attempts were made at seeking any paresthesias. Safe injection practices and needle disposal techniques used. Medications properly checked for expiration dates. SDV (single dose vial) medications used. Description of the Procedure: Protocol guidelines were followed. Two nurses trained to do implant refills were present during the entire procedure. The refill medication was checked by both healthcare providers as well as the patient. The patient was included in the "Time-out" to verify the medication. The patient was placed in position. The pump was identified. The area was prepped in the usual manner. The sterile template was positioned over the pump, making sure the side-port location matched that of the pump. Both, the pump and the template were held for stability. The needle provided in the Medtronic Kit was then introduced thru the center of the template and into the central port. The pump content  was aspirated and discarded volume documented. The new medication was slowly infused into the pump, thru the filter, making sure to avoid overpressure of the device. The needle was then removed and the area cleansed, making sure to leave some of the prepping solution back to take advantage of its long term bactericidal properties.  At 1 point the procedure nurse had some difficulty accessing the central port and injecting the medicine and therefore I had to personally assist her in getting this accomplished.  The pump was interrogated and programmed to reflect the correct medication, volume, and dosage. The program was printed and taken to the physician for approval. Once checked and signed by the physician, a copy was provided to the patient and another scanned into the EMR.  Vitals:   10/26/22 1321  BP: (!) 162/76  Pulse: 82  Resp: 16  Temp: (!) 96.9 F (36.1 C)  TempSrc: Temporal  SpO2: 98%  Weight: 160 lb (72.6 kg)  Height: 5' (1.524 m)  HC: 2" (5.1 cm)    Start Time: 1335 hrs. End Time: 1348 hrs. Materials & Medications: Medtronic Refill Kit Medication(s): Please see chart orders for details.  Imaging Guidance:          Type of Imaging Technique: None used Indication(s): N/A Exposure Time: No patient exposure Contrast: None used. Fluoroscopic Guidance: N/A Ultrasound Guidance: N/A Interpretation: N/A  Antibiotic Prophylaxis:   Anti-infectives (From admission, onward)    None      Indication(s): None identified  Post-operative Assessment:  Post-procedure Vital Signs:  Pulse/HCG  Rate: 82  Temp: (!) 96.9 F (36.1 C) Resp: 16 BP: (!) 162/76 SpO2: 98 %  EBL: None  Complications: No immediate post-treatment complications observed by team, or reported by patient.  Note: The patient tolerated the entire procedure well. A repeat set of vitals were taken after the procedure and the patient was kept under observation following institutional policy, for this type of  procedure. Post-procedural neurological assessment was performed, showing return to baseline, prior to discharge. The patient was provided with post-procedure discharge instructions, including a section on how to identify potential problems. Should any problems arise concerning this procedure, the patient was given instructions to immediately contact us, at any time, without hesitation. In any case, we plan to contact the patient by telephone for a follow-up status report regarding this interventional procedure.  Comments:  No additional relevant information.  Plan of Care (POC)  Orders:  Orders Placed This Encounter  Procedures   PUMP REFILL    Maintain Protocol by having two(2) healthcare providers during procedure and programming.    Scheduling Instructions:     Please refill intrathecal pump today.    Order Specific Question:   Where will this procedure be performed?    Answer:   [facility]   PUMP REFILL    Whenever possible schedule on a procedure today.    Standing Status:   Future    Standing Expiration Date:   02/25/2023    Scheduling Instructions:     Please schedule intrathecal pump refill based on pump programming. Avoid schedule intervals of more than 120 days (4 months).    Order Specific Question:   Where will this procedure be performed?    Answer:   [facility]   Informed Consent Details: Physician/Practitioner Attestation; Transcribe to consent form and obtain patient signature    Transcribe to consent form and obtain patient signature.    Order Specific Question:   Physician/Practitioner attestation of informed consent for procedure/surgical case    Answer:   I, the physician/practitioner, attest that I have discussed with the patient the benefits, risks, side effects, alternatives, likelihood of achieving goals and potential problems during recovery for the procedure that I have provided informed consent.    Order Specific Question:   Procedure     Answer:   Intrathecal pump refill    Order Specific Question:   Physician/Practitioner performing the procedure    Answer:   Attending Physician: Sydnee Levans. Laban Emperor, MD & designated trained staff    Order Specific Question:   Indication/Reason    Answer:   Chronic Pain Syndrome (G89.4), presence of an intrathecal pump (Z97.8)   Chronic Opioid Analgesic:  No other oral opioid analgesics prescribed by our practice. Intrathecal PF-Hydromorphone 3.9295 mg/day + PF-Sufentanil 34.383 mcg/day. MME: [facility]prox. 58.7 mg/day.   Medications ordered for procedure: No orders of the defined types were placed in this encounter.  Medications administered: Andrey Farmer had no medications administered during this visit.  See the medical record for exact dosing, route, and time of administration.  Follow-up plan:   Return for Pump Refill (Max:30mo).       Interventional Therapies  Risk  Complexity Considerations:   Presence of intrathecal pump  DM  CAD  Pulmonary hypertension    Planned  Pending:   Diagnostic/therapeutic bilateral lumbar (L4-5, L5-S1) facet MBB L3R1  Palliative intrathecal pump refills    Under consideration:   Diagnostic left lumbar facet MBB #3  Possible left lumbar facet RFA #1   Completed:  Diagnostic/therapeutic right caudal ESI x1 (01/04/2022) (100/100/100/100) (of right leg pain)  Diagnostic left lumbar facet MBB x2 (12/22/2016) (07/14/2020) (100/100/100/100) (of left low back pain)    Therapeutic  Palliative (PRN) options:   Therapeutic/palliative left sided lumbar facet MBB #3          Recent Visits Date Type Provider Dept  10/25/22 Procedure visit Delano Metz, MD [facility]  Showing recent visits within past 90 days and meeting all other requirements Today's Visits Date Type Provider Dept  10/26/22 Procedure visit Delano Metz, MD [facility]  Showing today's visits and meeting all other requirements Future  Appointments Date Type Provider Dept  01/03/23 Appointment Delano Metz, MD [facility]  Showing future appointments within next 90 days and meeting all other requirements  Disposition: Discharge home  Discharge (Date  Time): 10/26/2022; 1400 hrs.   Primary Care Physician: Chauncy Lean, PA-C Location: [facility] Note by: Oswaldo Done, MD (TTS technology used. I apologize for any typographical errors that were not detected and corrected.) Date: 10/26/2022; Time: 2:01 PM  Disclaimer:  Medicine is not an Visual merchandiser. The only guarantee in medicine is that nothing is guaranteed. It is important to note that the decision to proceed with this intervention was based on the information collected from the patient. The Data and conclusions were drawn from the patient's questionnaire, the interview, and the physical examination. Because the information was provided in large part by the patient, it cannot be guaranteed that it has not been purposely or unconsciously manipulated. Every effort has been made to obtain as much relevant data as possible for this evaluation. It is important to note that the conclusions that lead to this procedure are derived in large part from the available data. Always take into account that the treatment will also be dependent on availability of resources and existing treatment guidelines, considered by other Pain Management Practitioners as being common knowledge and practice, at the time of the intervention. For Medico-Legal purposes, it is also important to point out that variation in procedural techniques and pharmacological choices are the acceptable norm. The indications, contraindications, technique, and results of the above procedure should only be interpreted and judged by a Board-Certified Interventional Pain Specialist with extensive familiarity and expertise in the same exact procedure and technique.

## 2022-11-28 MED FILL — Medication: INTRATHECAL | Qty: 1 | Status: AC

## 2022-12-13 ENCOUNTER — Other Ambulatory Visit: Payer: Self-pay

## 2022-12-13 MED ORDER — PAIN MANAGEMENT IT PUMP REFILL: 1.0000 | Freq: Once | INTRATHECAL | 0 refills | Status: AC

## 2023-01-02 NOTE — Progress Notes (Unsigned)
PROVIDER NOTE: Interpretation of information contained herein should be left to medically-trained personnel. Specific patient instructions are provided elsewhere under "Patient Instructions" section of medical record. This document was created in part using STT-dictation technology, any transcriptional errors that may result from this process are unintentional.  Patient: Tiffany Hunter Type: Established DOB: 05-12-1946 MRN: [id number] PCP: Chauncy Lean, PA-C  Service: Procedure DOS: 01/03/2023 Setting: Ambulatory Location: Ambulatory outpatient facility Delivery: Face-to-face Provider: Oswaldo Done, MD Specialty: Interventional Pain Management Specialty designation: 09 Location: Outpatient facility Ref. Prov.: Chauncy Lean, PA-C       Interventional Therapy   Primary Reason for Visit: Interventional Pain Management Treatment. CC: No chief complaint on file.  Procedure:          Type: Management of Intrathecal Drug Delivery System (IDDS) - Reservoir Refill (54270). No rate change.  Indications: 1. Chronic pain syndrome   2. 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 Chronic lower extremity pain (2ry area of Pain) (Left)   4. DDD (degenerative disc disease), lumbosacral   5. Chronic lumbar radicular pain (L4 Dermatome) (Left)   6. Lumbar facet joint syndrome (Left)   7. 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 Chronic use of opiate for therapeutic purpose   10. 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 Encounter for adjustment or management of infusion pump    Pain Assessment: Self-Reported Pain Score:  /10             Reported level is compatible with observation.         Intrathecal Drug Delivery System (IDDS)  Pump Device:  Manufacturer: Medtronic Model: Synchromed II Model No.: [id number] Serial No.: [id number] H Delivery Route:  Intrathecal Type: Programmable  Volume (mL): 40 mL reservoir Priming Volume: N/A  Calibration Constant: 114.0  MRI compatibility: Conditional   Implant Details:  Date: 07/07/2017 Implanter: Georges Mouse, MD Contact Information: [facility] [phone] Last Revision/Replacement: 07/07/2017 Estimated Replacement Date: Nov/2025  Implant Site: Abdominal Laterality: Left  Catheter: Manufacturer: Medtronic Model:  Intrathecal catheter Model No.: 8731  Serial No.: n/a  Implanted Length (cm): 28.9  Catheter Volume (mL): 0.209  Tip Location (Level): T11 (Left) Canal Access Site: L2-3  Drug content:  Primary Medication Class: Opioid  Medication: PF-Hydromorphone (Dilaudid)  Concentration: 8 mg/mL   Secondary Medication Class: Opioid  Medication: PF-Sufentanil  Concentration: 70 mcg/mL   Tertiary Medication Class: none  Medication: none   PA parameters (PCA-mode):  Mode: Off (Inactive)  Programming:  Type: Simple continuous.  Medication, Concentration, Infusion Program, & Delivery Rate: For up-to-date details please see most recent scanned programming printout.   Intrathecal Pump Therapy Assessment  Manufacturer: Medtronic Synchromed Type: Programmable Volume: 40 mL reservoir MRI compatibility: Yes    Drug content:  Primary Medication Class: Opioid Primary Medication: PF-Hydromorphone (Dilaudid) (8 mg/mL)  Secondary Medication: PF-Sufentanil (70 g/mL)  Other Medication: No third    Programming:  Type: Simple continuous. See pump readout for details.       Changes:  Medication Change: None at this point Rate Change: No change in rate  Reported side-effects or adverse reactions: None reported  Effectiveness: Described as relatively effective, allowing for increase in activities of daily living (ADL) Clinically meaningful improvement in function (CMIF): Sustained CMIF goals met  Plan: Pump refill today   Pharmacotherapy Assessment   Opioid  Analgesic: No other oral  opioid analgesics prescribed by our practice. Intrathecal PF-Hydromorphone 3.9295 mg/day + PF-Sufentanil 34.383 mcg/day. MME: Aprox. 58.7 mg/day.   Monitoring: [place] PMP: PDMP reviewed during this encounter.       Pharmacotherapy: No side-effects or adverse reactions reported. Compliance: No problems identified. Effectiveness: Clinically acceptable. Plan: Refer to "POC". UDS:  Summary  Date Value Ref Range Status  07/30/2019 Note  Final    Comment:    ==================================================================== Compliance Drug Analysis, Ur ==================================================================== Test                             Result       Flag       Units Drug Present not Declared for Prescription Verification   Hydromorphone                  1757         UNEXPECTED ng/mg creat    Hydromorphone may be administered as a scheduled prescription    medication; it is also an expected metabolite of hydrocodone.   Naproxen                       PRESENT      UNEXPECTED Drug Absent but Declared for Prescription Verification   Salicylate                     Not Detected UNEXPECTED    Aspirin, as indicated in the declared medication list, is not always    detected even when used as directed. ==================================================================== Test                      Result    Flag   Units      Ref Range   Creatinine              182              mg/dL      >=45 ==================================================================== Declared Medications:  The flagging and interpretation on this report are based on the  following declared medications.  Unexpected results may arise from  inaccuracies in the declared medications.  **Note: The testing scope of this panel does not include small to  moderate amounts of these reported medications:  Aspirin  **Note: The testing scope of this panel does not include the  following reported  medications:  Albuterol (Ventolin HFA)  Atorvastatin (Lipitor)  Betamethasone (Lotrisone)  Carvedilol (Coreg)  Cholecalciferol  Clotrimazole (Lotrisone)  Hydrocortisone  Mirabegron (Myrbetriq)  Naloxegol (Movantik)  Oxybutynin (Ditropan)  Supplement ==================================================================== For clinical consultation, please call [phone]. ====================================================================    No results found for: "CBDTHCR", "D8THCCBX", "D9THCCBX"   H&P (Pre-op Assessment):  Ms. Choquette is a 77 y.o. (year old), female patient, seen today for interventional treatment. She  has a past surgical history that includes Carpal tunnel release (Bilateral); Back surgery; Breast surgery (Bilateral); Cholecystectomy; Colonoscopy w/ polypectomy; Cataract extraction w/ intraocular lens implant (Right); Abdominal hysterectomy; Fracture surgery (Right); Cardiac catheterization (2012); and Pain pump revision (Left, 07/07/2017). Ms. Standish has a current medication list which includes the following prescription(s): albuterol, atorvastatin, carvedilol, celecoxib, diethylpropion hcl cr, losartan, meloxicam, naloxegol oxalate, naloxone, NON FORMULARY, omeprazole, oxybutynin, and ssd. Her primarily concern today is the No chief complaint on file.  Initial Vital Signs:  Pulse/HCG Rate:    Temp:   Resp:   BP:   SpO2:    BMI: Estimated body mass  index is 31.25 kg/m as calculated from the following:   Height as of 10/26/22: 5' (1.524 m).   Weight as of 10/26/22: 160 lb (72.6 kg).  Risk Assessment: Allergies: Reviewed. She is allergic to doxycycline hyclate and sulfa antibiotics.  Allergy Precautions: None required Coagulopathies: Reviewed. None identified.  Blood-thinner therapy: None at this time Active Infection(s): Reviewed. None identified. Ms. Stadtler is afebrile  Site Confirmation: Ms. Cumba was asked to confirm the procedure and laterality  before marking the site Procedure checklist: Completed Consent: Before the procedure and under the influence of no sedative(s), amnesic(s), or anxiolytics, the patient was informed of the treatment options, risks and possible complications. To fulfill our ethical and legal obligations, as recommended by the American Medical Association's Code of Ethics, I have informed the patient of my clinical impression; the nature and purpose of the treatment or procedure; the risks, benefits, and possible complications of the intervention; the alternatives, including doing nothing; the risk(s) and benefit(s) of the alternative treatment(s) or procedure(s); and the risk(s) and benefit(s) of doing nothing.  Ms. Herko was provided with information about the general risks and possible complications associated with most interventional procedures. These include, but are not limited to: failure to achieve desired goals, infection, bleeding, organ or nerve damage, allergic reactions, paralysis, and/or death.  In addition, she was informed of those risks and possible complications associated to this particular procedure, which include, but are not limited to: damage to the implant; failure to decrease pain; local, systemic, or serious CNS infections, intraspinal abscess with possible cord compression and paralysis, or life-threatening such as meningitis; bleeding; organ damage; nerve injury or damage with subsequent sensory, motor, and/or autonomic system dysfunction, resulting in transient or permanent pain, numbness, and/or weakness of one or several areas of the body; allergic reactions, either minor or major life-threatening, such as anaphylactic or anaphylactoid reactions.  Furthermore, Ms. Mcglown was informed of those risks and complications associated with the medications. These include, but are not limited to: allergic reactions (i.e.: anaphylactic or anaphylactoid reactions); endorphine suppression; bradycardia  and/or hypotension; water retention and/or peripheral vascular relaxation leading to lower extremity edema and possible stasis ulcers; respiratory depression and/or shortness of breath; decreased metabolic rate leading to weight gain; swelling or edema; medication-induced neural toxicity; particulate matter embolism and blood vessel occlusion with resultant organ, and/or nervous system infarction; and/or intrathecal granuloma formation with possible spinal cord compression and permanent paralysis.  Before refilling the pump Ms. Eckman was informed that some of the medications used in the devise may not be FDA approved for such use and therefore it constitutes an off-label use of the medications.  Finally, she was informed that Medicine is not an exact science; therefore, there is also the possibility of unforeseen or unpredictable risks and/or possible complications that may result in a catastrophic outcome. The patient indicated having understood very clearly. We have given the patient no guarantees and we have made no promises. Enough time was given to the patient to ask questions, all of which were answered to the patient's satisfaction. Ms. Allinger has indicated that she wanted to continue with the procedure. Attestation: I, the ordering provider, attest that I have discussed with the patient the benefits, risks, side-effects, alternatives, likelihood of achieving goals, and potential problems during recovery for the procedure that I have provided informed consent. Date  Time: {CHL [facility]-PAIN TIME CHOICES:[id number]}  Pre-Procedure Preparation:  Monitoring: As per clinic protocol. Respiration, ETCO2, SpO2, BP, heart rate and rhythm monitor placed and checked for adequate  function Safety Precautions: Patient was assessed for positional comfort and pressure points before starting the procedure. Time-out: I initiated and conducted the "Time-out" before starting the procedure, as per protocol. The  patient was asked to participate by confirming the accuracy of the "Time Out" information. Verification of the correct person, site, and procedure were performed and confirmed by me, the nursing staff, and the patient. "Time-out" conducted as per Joint Commission's Universal Protocol (UP.01.01.01). Time:   Start Time:   hrs.  Description of Procedure:          Position: Supine Target Area: Central-port of intrathecal pump. Approach: Anterior, 90 degree angle approach. Area Prepped: Entire Area around the pump implant. DuraPrep (Iodine Povacrylex [0.7% available iodine] and Isopropyl Alcohol, 74% w/w) Safety Precautions: Aspiration looking for blood return was conducted prior to all injections. At no point did we inject any substances, as a needle was being advanced. No attempts were made at seeking any paresthesias. Safe injection practices and needle disposal techniques used. Medications properly checked for expiration dates. SDV (single dose vial) medications used. Description of the Procedure: Protocol guidelines were followed. Two nurses trained to do implant refills were present during the entire procedure. The refill medication was checked by both healthcare providers as well as the patient. The patient was included in the "Time-out" to verify the medication. The patient was placed in position. The pump was identified. The area was prepped in the usual manner. The sterile template was positioned over the pump, making sure the side-port location matched that of the pump. Both, the pump and the template were held for stability. The needle provided in the Medtronic Kit was then introduced thru the center of the template and into the central port. The pump content was aspirated and discarded volume documented. The new medication was slowly infused into the pump, thru the filter, making sure to avoid overpressure of the device. The needle was then removed and the area cleansed, making sure to leave some  of the prepping solution back to take advantage of its long term bactericidal properties. The pump was interrogated and programmed to reflect the correct medication, volume, and dosage. The program was printed and taken to the physician for approval. Once checked and signed by the physician, a copy was provided to the patient and another scanned into the EMR.  There were no vitals filed for this visit.  Start Time:   hrs. End Time:   hrs. Materials & Medications: Medtronic Refill Kit Medication(s): Please see chart orders for details.  Type of Imaging Technique: None used Indication(s): N/A Exposure Time: No patient exposure Contrast: None used. Fluoroscopic Guidance: N/A Ultrasound Guidance: N/A Interpretation: N/A  Antibiotic Prophylaxis:   Anti-infectives (From admission, onward)    None      Indication(s): None identified  Post-operative Assessment:  Post-procedure Vital Signs:  Pulse/HCG Rate:    Temp:   Resp:   BP:   SpO2:    EBL: None  Complications: No immediate post-treatment complications observed by team, or reported by patient.  Note: The patient tolerated the entire procedure well. A repeat set of vitals were taken after the procedure and the patient was kept under observation following institutional policy, for this type of procedure. Post-procedural neurological assessment was performed, showing return to baseline, prior to discharge. The patient was provided with post-procedure discharge instructions, including a section on how to identify potential problems. Should any problems arise concerning this procedure, the patient was given instructions to immediately contact us, at  any time, without hesitation. In any case, we plan to contact the patient by telephone for a follow-up status report regarding this interventional procedure.  Comments:  No additional relevant information.  Plan of Care (POC)  Orders:  No orders of the defined types were placed in this  encounter.  Chronic Opioid Analgesic:  No other oral opioid analgesics prescribed by our practice. Intrathecal PF-Hydromorphone 3.9295 mg/day + PF-Sufentanil 34.383 mcg/day. MME: Aprox. 58.7 mg/day.   Medications ordered for procedure: No orders of the defined types were placed in this encounter.  Medications administered: Andrey Farmer had no medications administered during this visit.  See the medical record for exact dosing, route, and time of administration.  Follow-up plan:   No follow-ups on file.       Interventional Therapies  Risk  Complexity Considerations:   Presence of intrathecal pump  DM  CAD  Pulmonary hypertension    Planned  Pending:   Diagnostic/therapeutic bilateral lumbar (L4-5, L5-S1) facet MBB L3R1  Palliative intrathecal pump refills    Under consideration:   Diagnostic left lumbar facet MBB #3  Possible left lumbar facet RFA #1   Completed:   Diagnostic/therapeutic right caudal ESI x1 (01/04/2022) (100/100/100/100) (of right leg pain)  Diagnostic left lumbar facet MBB x2 (12/22/2016) (07/14/2020) (100/100/100/100) (of left low back pain)    Therapeutic  Palliative (PRN) options:   Therapeutic/palliative left sided lumbar facet MBB #3           Recent Visits Date Type Provider Dept  10/26/22 Procedure visit Delano Metz, MD [facility]  Showing recent visits within past 90 days and meeting all other requirements Future Appointments Date Type Provider Dept  01/03/23 Appointment Delano Metz, MD [facility]  Showing future appointments within next 90 days and meeting all other requirements  Disposition: Discharge home  Discharge (Date  Time): 01/03/2023;   hrs.   Primary Care Physician: Chauncy Lean, PA-C Location: [facility] Note by: Oswaldo Done, MD (TTS technology used. I apologize for any typographical errors that were not detected and corrected.) Date:  01/03/2023; Time: 5:23 AM  Disclaimer:  Medicine is not an Visual merchandiser. The only guarantee in medicine is that nothing is guaranteed. It is important to note that the decision to proceed with this intervention was based on the information collected from the patient. The Data and conclusions were drawn from the patient's questionnaire, the interview, and the physical examination. Because the information was provided in large part by the patient, it cannot be guaranteed that it has not been purposely or unconsciously manipulated. Every effort has been made to obtain as much relevant data as possible for this evaluation. It is important to note that the conclusions that lead to this procedure are derived in large part from the available data. Always take into account that the treatment will also be dependent on availability of resources and existing treatment guidelines, considered by other Pain Management Practitioners as being common knowledge and practice, at the time of the intervention. For Medico-Legal purposes, it is also important to point out that variation in procedural techniques and pharmacological choices are the acceptable norm. The indications, contraindications, technique, and results of the above procedure should only be interpreted and judged by a Board-Certified Interventional Pain Specialist with extensive familiarity and expertise in the same exact procedure and technique.

## 2023-01-03 ENCOUNTER — Encounter: Payer: Self-pay | Admitting: Pain Medicine

## 2023-01-03 ENCOUNTER — Ambulatory Visit: Payer: Medicare Other | Attending: Pain Medicine | Admitting: Pain Medicine

## 2023-01-03 VITALS — BP 144/67 | HR 77 | Temp 98.0°F | Resp 16 | Ht 62.0 in | Wt 155.0 lb

## 2023-01-03 DIAGNOSIS — M5137 Other intervertebral disc degeneration, lumbosacral region: Secondary | ICD-10-CM | POA: Insufficient documentation

## 2023-01-03 DIAGNOSIS — M961 Postlaminectomy syndrome, not elsewhere classified: Secondary | ICD-10-CM | POA: Diagnosis present

## 2023-01-03 DIAGNOSIS — Z79891 Long term (current) use of opiate analgesic: Secondary | ICD-10-CM

## 2023-01-03 DIAGNOSIS — Z978 Presence of other specified devices: Secondary | ICD-10-CM | POA: Diagnosis present

## 2023-01-03 DIAGNOSIS — G894 Chronic pain syndrome: Secondary | ICD-10-CM

## 2023-01-03 DIAGNOSIS — M533 Sacrococcygeal disorders, not elsewhere classified: Secondary | ICD-10-CM | POA: Diagnosis present

## 2023-01-03 DIAGNOSIS — G8929 Other chronic pain: Secondary | ICD-10-CM | POA: Diagnosis present

## 2023-01-03 DIAGNOSIS — M47816 Spondylosis without myelopathy or radiculopathy, lumbar region: Secondary | ICD-10-CM

## 2023-01-03 DIAGNOSIS — M5442 Lumbago with sciatica, left side: Secondary | ICD-10-CM | POA: Diagnosis present

## 2023-01-03 DIAGNOSIS — M79605 Pain in left leg: Secondary | ICD-10-CM | POA: Insufficient documentation

## 2023-01-03 DIAGNOSIS — M5416 Radiculopathy, lumbar region: Secondary | ICD-10-CM | POA: Diagnosis present

## 2023-01-03 DIAGNOSIS — M51379 Other intervertebral disc degeneration, lumbosacral region without mention of lumbar back pain or lower extremity pain: Secondary | ICD-10-CM

## 2023-01-03 DIAGNOSIS — Z451 Encounter for adjustment and management of infusion pump: Secondary | ICD-10-CM | POA: Diagnosis present

## 2023-01-03 NOTE — Patient Instructions (Signed)

## 2023-01-03 NOTE — Progress Notes (Signed)
Safety precautions to be maintained throughout the outpatient stay will include: orient to surroundings, keep bed in low position, maintain call bell within reach at all times, provide assistance with transfer out of bed and ambulation.  

## 2023-01-04 ENCOUNTER — Telehealth: Payer: Self-pay | Admitting: *Deleted

## 2023-01-04 NOTE — Telephone Encounter (Signed)
Post procedure call; spoke with her son, states she is doing good.

## 2023-01-27 MED FILL — Medication: INTRATHECAL | Qty: 1 | Status: AC

## 2023-02-13 ENCOUNTER — Other Ambulatory Visit: Payer: Self-pay

## 2023-02-13 MED ORDER — PAIN MANAGEMENT IT PUMP REFILL: 1.0000 | Freq: Once | INTRATHECAL | 0 refills | Status: AC

## 2023-03-14 ENCOUNTER — Encounter: Payer: Self-pay | Admitting: Pain Medicine

## 2023-03-14 ENCOUNTER — Ambulatory Visit: Payer: Medicare Other | Attending: Pain Medicine | Admitting: Pain Medicine

## 2023-03-14 VITALS — BP 187/73 | HR 70 | Temp 97.1°F | Resp 14 | Ht 62.0 in | Wt 156.0 lb

## 2023-03-14 DIAGNOSIS — G894 Chronic pain syndrome: Secondary | ICD-10-CM | POA: Diagnosis present

## 2023-03-14 DIAGNOSIS — M5416 Radiculopathy, lumbar region: Secondary | ICD-10-CM | POA: Insufficient documentation

## 2023-03-14 DIAGNOSIS — Z978 Presence of other specified devices: Secondary | ICD-10-CM | POA: Insufficient documentation

## 2023-03-14 DIAGNOSIS — G8929 Other chronic pain: Secondary | ICD-10-CM | POA: Diagnosis present

## 2023-03-14 DIAGNOSIS — M51372 Other intervertebral disc degeneration, lumbosacral region with discogenic back pain and lower extremity pain: Secondary | ICD-10-CM | POA: Insufficient documentation

## 2023-03-14 DIAGNOSIS — Z451 Encounter for adjustment and management of infusion pump: Secondary | ICD-10-CM | POA: Insufficient documentation

## 2023-03-14 DIAGNOSIS — Z79891 Long term (current) use of opiate analgesic: Secondary | ICD-10-CM | POA: Insufficient documentation

## 2023-03-14 DIAGNOSIS — M47816 Spondylosis without myelopathy or radiculopathy, lumbar region: Secondary | ICD-10-CM | POA: Insufficient documentation

## 2023-03-14 DIAGNOSIS — M5442 Lumbago with sciatica, left side: Secondary | ICD-10-CM | POA: Insufficient documentation

## 2023-03-14 DIAGNOSIS — M961 Postlaminectomy syndrome, not elsewhere classified: Secondary | ICD-10-CM | POA: Diagnosis present

## 2023-03-14 DIAGNOSIS — M79605 Pain in left leg: Secondary | ICD-10-CM | POA: Diagnosis present

## 2023-03-14 DIAGNOSIS — M533 Sacrococcygeal disorders, not elsewhere classified: Secondary | ICD-10-CM | POA: Diagnosis present

## 2023-03-14 NOTE — Patient Instructions (Addendum)
Opioid Overdose Opioids are drugs that are often used to treat pain. Opioids include illegal drugs, such as heroin, as well as prescription pain medicines, such as codeine, morphine, hydrocodone, and fentanyl. An opioid overdose happens when you take too much of an opioid. An overdose may be intentional or accidental and can happen with any type of opioid. The effects of an overdose can be mild, dangerous, or even deadly. Opioid overdose is a medical emergency. What are the causes? This condition may be caused by: Taking too much of an opioid on purpose. Taking too much of an opioid by accident. Using two or more substances that contain opioids at the same time. Taking an opioid with a substance that affects your heart, breathing, or blood pressure. These include alcohol, tranquilizers, sleeping pills, illegal drugs, and some over-the-counter medicines. This condition may also happen due to an error made by: A health care provider who prescribes a medicine. The pharmacist who fills the prescription. What increases the risk? This condition is more likely in: Children. They may be attracted to colorful pills. Because of a child's small size, even a small amount of a medicine can be dangerous. Older people. They may be taking many different medicines. Older people may have difficulty reading labels or remembering when they last took their medicines. They may also be more sensitive to the effects of opioids. People with chronic medical conditions, especially heart, liver, kidney, or neurological diseases. People who take an opioid for a long period of time. People who take opioids and use illegal drugs, such as heroin, or other substances, such as alcohol. People who: Have a history of drug or alcohol abuse. Have certain mental health conditions. Have a history of previous drug overdoses. People who take opioids that are not prescribed for them. What are the signs or symptoms? Symptoms of this  condition depend on the type of opioid and the amount that was taken. Common symptoms include: Sleepiness or difficulty waking from sleep. Confusion. Slurred speech. Slowed breathing and a slow pulse (bradycardia). Nausea and vomiting. Abnormally small pupils. Signs and symptoms that require emergency treatment include: Cold, clammy, and pale skin. Blue lips and fingernails. Vomiting. Gurgling sounds in the throat. A pulse that is very slow or difficult to detect. Breathing that is very irregular, slow, noisy, or difficult to detect. Inability to respond to speech or be awakened from sleep (stupor). Seizures. How is this diagnosed? This condition is diagnosed based on your symptoms and medical history. It is important to tell your health care provider: About all of the opioids that you took. When you took the opioids. Whether you were drinking alcohol or using marijuana, cocaine, or other drugs. Your health care provider will do a physical exam. This exam may include: Checking and monitoring your heart rate and rhythm, breathing rate, temperature, and blood pressure. Measuring oxygen levels in your blood. Checking for abnormally small pupils. You may also have blood tests or urine tests. You may have X-rays if you are having severe breathing problems. How is this treated? This condition requires immediate medical treatment and hospitalization. Reversing the effects of the opioid is the first step in treatment. If you have a Narcan kit or naloxone, use it right away. Follow your health care provider's instructions. A friend or family member can also help you with this. The rest of your treatment will be given in the hospital intensive care (ICU). Treatment in the hospital may include: Giving salts and minerals (electrolytes) along with fluids through  an IV. Inserting a breathing tube (endotracheal tube) in your airway to help you breathe if you cannot breathe on your own or you are in  danger of not being able to breathe on your own. Giving oxygen through a small tube under your nose. Passing a tube through your nose and into your stomach (nasogastric tube, or NG tube) to empty your stomach. Giving medicines that: Increase your blood pressure. Relieve nausea and vomiting. Relieve abdominal pain and cramping. Reverse the effects of the opioid (naloxone). Monitoring your heart and oxygen levels. Ongoing counseling and mental health support if you intentionally overdosed or used an illegal drug. Follow these instructions at home:  Medicines Take over-the-counter and prescription medicines only as told by your health care provider. Always ask your health care provider about possible side effects and interactions of any new medicine that you start taking. Keep a list of all the medicines that you take, including over-the-counter medicines. Bring this list with you to all your medical visits. General instructions Drink enough fluid to keep your urine pale yellow. Keep all follow-up visits. This is important. How is this prevented? Read the drug inserts that come with your opioid pain medicines. Take medicines only as told by your health care provider. Do not take more medicine than you are told. Do not take medicines more frequently than you are told. Do not drink alcohol or take sedatives when taking opioids. Do not use illegal or recreational drugs, including cocaine, ecstasy, and marijuana. Do not take opioid medicines that are not prescribed for you. Store all medicines in safety containers that are out of the reach of children. Get help if you are struggling with: Alcohol or drug use. Depression or another mental health problem. Thoughts of hurting yourself or another person. Keep the phone number of your local poison control center near your phone or in your mobile phone. In the [place] the hotline of the [facility] is 830-266-3279. If you were  prescribed naloxone, make sure you understand how to take it. Contact a health care provider if: You need help understanding how to take your pain medicines. You feel your medicines are too strong. You are concerned that your pain medicines are not working well for your pain. You develop new symptoms or side effects when you are taking medicines. Get help right away if: You or someone else is having symptoms of an opioid overdose. Get help even if you are not sure. You have thoughts about hurting yourself or others. You have: Chest pain. Difficulty breathing. A loss of consciousness. These symptoms may represent a serious problem that is an emergency. Do not wait to see if the symptoms will go away. Get medical help right away. Call your local emergency services (911 in the [place] Do not drive yourself to the hospital. If you ever feel like you may hurt yourself or others, or have thoughts about taking your own life, get help right away. You can go to your nearest emergency department or: Call your local emergency services (911 in the [place] Call a suicide crisis helpline, such as the National Suicide Prevention Lifeline at 760-179-1914 or 988 in the [place] This is open 24 hours a day in the [place] Text the Crisis Text Line at (680) 038-5146 (in the [place] Summary Opioids are drugs that are often used to treat pain. Opioids include illegal drugs, such as heroin, as well as prescription pain medicines. An opioid overdose happens when you take too much of an  opioid. Overdoses can be intentional or accidental. Opioid overdose is very dangerous. It is a life-threatening emergency. If you or someone you know is experiencing an opioid overdose, get help right away. This information is not intended to replace advice given to you by your health care provider. Make sure you discuss any questions you have with your health care provider. Document Revised: 11/25/2020 Document Reviewed: 08/12/2020 [employer]  Patient Education  2024 [employer]  ______________________________________________________________________    Muscle Spasms & Cramps  Cause(s):  Most common - vitamin and/or electrolyte (calcium, potassium, sodium, etc.) deficiencies. Post procedure - steroids (injected, oral, or inhaled) can make your kidneys excrete (loose) electrolytes. Most of the time this will not cause any symptoms however, if you happen to be borderline low on your electrolytes, it may temporarily triggering cramps & spasms.  Possible triggers: Sweating - causes loss of electrolytes thru the skin. Steroids - causes loss of electrolytes thru the urine.  Treatment: (over-the-counter)  Gatorade (or any other electrolyte-replenishing drink) - Take 1, 8 oz glass with each meal (3 times a day). Mechanism of action: Replenishes lost electrolytes. Magnesium 400 to 500 mg - Take 1 tablet twice a day (one with breakfast and one at bedtime). If you have kidney disease talk to your primary care physician before taking any Magnesium. Mechanism of action: Magnesium is a natural muscle relaxant. Tonic Water with quinine - Take 1, 8 oz glass before bedtime.  Mechanism of action: Quinine is used to treat spasms.  Last Update: 11/24/2022  ______________________________________________________________________     ______________________________________________________________________    OTC Supplements:   The following is a list of over-the-counter (OTC) supplements that have been found to have [employer]) studies suggesting that they may be of some benefits when used in moderation in some chronic pain-related conditions.  NOTE:  Always consult with your primary care provider and/or pharmacist before taking any OTC medications to make sure they will not interact with your current medications. Always use manufacturer's recommended dosage.  Supplement Possible benefit May be of benefit in treatment of   Active ingredient(s)  Turmeric/curcumin anti-inflammatory Joint and muscle aches and pain associated with arthritis and inflammation The main active ingredient in turmeric is curcumin.  Glucosamine/chondroitin (triple strength) may slow loss of articular cartilage Osteoarthritis glucosamine HCl and chondroitin sulfate  Vitamin D-3* may suppress release of chemicals associated with inflammation Joint and muscle aches and pain associated with arthritis and inflammation. It is known that vitamin D has a beneficial effect on the modulation of the immune system components responsible for the inflammatory process. Vitamin D3 may be an alternative supplementary treatment for chronic arthritis.  CHOLECALCIFEROL; ALPHA.-TOCOPHEROL, D  Moringa(+) anti-inflammatory with mild analgesic effects Joint and muscle aches and pain associated with arthritis and inflammation Many:  Phenolic acids: Gallic acid, ellagic acid, ferulic acid, caffeic acid, o-coumaric acid, and chlorogenic acid. Flavonoids: Rutin, quercetin, rhamnetin, kaempferol, apigenin, and myricetin. Terpenes: Lutein, all-E-luteoxanthin, 13-Z-lutein, 15-Z-?-carotene, and all-E-zeaxanthin. Alkaloids: Marumoside A, marumoside B, and pyrrolemarumine-4?-O-?-L-rhamnopyranoside. Vitamins: Vitamin A and vitamin C. Protein: Milk protein  Melatonin(+) Helps reset sleep cycle. Insomnia. May also be helpful in neurodegenerative disorders melatonin, also known as N-acetyl-5-methoxytryptamine  Vitamin B-12* may help keep nerves and blood cells healthy as well as maintaining function of nervous system Neuropathies. Nerve pain (Burning pain) methylcobalamin and 5-deoxyadenosylcobalamin  Alpha-Lipoic-Acid (ALA)* antioxidant that may help with nerve health, pain, and blocking the activation of some inflammatory chemicals Diabetic neuropathy and metabolic syndrome Alpha-lipoic acid (LA), or 1,2-dithiolane-3-pentanoic acid  superoxide dismutase (SOD)** Currently being reviewed.   Superoxide dismutase (SOD); Copper-zinc superoxide dismutase  Tiger Balm Currently being reviewed.  Camphor; Menthol; Capsicum Extract (Capsicin); Methyl Salicylate; Essential Oils (Cassia Oil, Cajuput Oil, Clove Oil, and Dementholized Mint Oil)  hydrolyzed collagen peptides* Currently being reviewed. Possible chondroprotective effects. May help with protection of joint health. Possible skin protective effects. Collagen supplementation may increases bone strength, density, and mass; may improve joint stiffness/mobility, and functionality; and may reduce joint pain. Hydrolyzed collagen peptides caused diminished proinflammatory cytokine levels, an effect synergized significantly by the simultaneous treatment with vitamin D3. Vit D3 may help prevent tolerance to collagen supplementation.  Methylsulfonylmethane (MSM)* Currently being reviewed.    CBD(+) Currently being reviewed.    Delta-8 THC(+) Currently being reviewed.    *  Generally Recognized As Safe (GRAS) approved substance.-FDA ([employer]) ** "Possibly Safe", but not considered Generally Recognized As Safe (Not GRAS) by the [place] (FDA) as a food additive. (+) Not considered Generally Recognized As Safe (Not GRAS) by the [employer] (FDA) as a food additive.  ______________________________________________________________________

## 2023-03-14 NOTE — Progress Notes (Signed)
PROVIDER NOTE: Interpretation of information contained herein should be left to medically-trained personnel. Specific patient instructions are provided elsewhere under "Patient Instructions" section of medical record. This document was created in part using STT-dictation technology, any transcriptional errors that may result from this process are unintentional.  Patient: Tiffany Hunter Type: Established DOB: 1945-08-26 MRN: [id number] PCP: Chauncy Lean, PA-C  Service: Procedure DOS: 03/14/2023 Setting: Ambulatory Location: Ambulatory outpatient facility Delivery: Face-to-face Provider: Oswaldo Done, MD Specialty: Interventional Pain Management Specialty designation: 09 Location: Outpatient facility Ref. Prov.: Chauncy Lean, PA-C       Interventional Therapy   Primary Reason for Visit: Interventional Pain Management Treatment. CC: Leg Pain (right)  Procedure:          Type: Management of Intrathecal Drug Delivery System (IDDS) - Reservoir Refill (52841). No rate change.  Indications: 1. Chronic pain syndrome   2. 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 Chronic lower extremity pain (2ry area of Pain) (Left)   4. Degeneration of intervertebral disc of lumbosacral region with discogenic back pain and lower extremity pain   5. Chronic lumbar radicular pain (L4 Dermatome) (Left)   6. Lumbar facet joint syndrome (Left)   7. 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 Chronic use of opiate for therapeutic purpose   10. 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 Encounter for adjustment or management of infusion pump   12. Encounter for chronic pain management    Pain Assessment: Self-Reported Pain Score: 9 /10             Reported level is compatible with observation.         Intrathecal Drug Delivery System (IDDS)  Pump Device:   Manufacturer: Medtronic Model: Synchromed II Model No.: [id number] Serial No.: [id number] H Delivery Route: Intrathecal Type: Programmable  Volume (mL): 40 mL reservoir Priming Volume: N/A  Calibration Constant: 114.0  MRI compatibility: Conditional   Implant Details:  Date: 07/07/2017 Implanter: Georges Mouse, MD Contact Information: [facility] [phone] Last Revision/Replacement: 07/07/2017 Estimated Replacement Date: Nov/2025  Implant Site: Abdominal Laterality: Left  Catheter: Manufacturer: Medtronic Model:  Intrathecal catheter Model No.: 8731  Serial No.: n/a  Implanted Length (cm): 28.9  Catheter Volume (mL): 0.209  Tip Location (Level): T11 (Left) Canal Access Site: L2-3  Drug content:  Primary Medication Class: Opioid  Medication: PF-Hydromorphone (Dilaudid)  Concentration: 8 mg/mL   Secondary Medication Class: Opioid  Medication: PF-Sufentanil  Concentration: 70 mcg/mL   Tertiary Medication Class: none  Medication: none   PA parameters (PCA-mode):  Mode: Off (Inactive)  Programming:  Type: Simple continuous.  Medication, Concentration, Infusion Program, & Delivery Rate: For up-to-date details please see most recent scanned programming printout.   Intrathecal Pump Therapy Assessment  Manufacturer: Medtronic Synchromed Type: Programmable Volume: 40 mL reservoir MRI compatibility: Yes    Drug content:  Primary Medication Class: Opioid Primary Medication: PF-Hydromorphone (Dilaudid) (8 mg/mL)  Secondary Medication: PF-Sufentanil (70 g/mL)  Other Medication: No third    Programming:  Type: Simple continuous. See pump readout for details.        Changes:  Medication Change: None at this point Rate Change: No change in rate  Reported side-effects or adverse reactions: None reported  Effectiveness: Described as relatively effective, allowing for increase in activities of daily living (ADL) Clinically meaningful improvement in  function (CMIF): Sustained CMIF goals  met  Plan: Pump refill today   Pharmacotherapy Assessment   Opioid Analgesic: No other oral opioid analgesics prescribed by our practice. Intrathecal PF-Hydromorphone 3.9295 mg/day + PF-Sufentanil 34.383 mcg/day. MME: Aprox. 58.7 mg/day.   Monitoring: [place] PMP: PDMP reviewed during this encounter.       Pharmacotherapy: No side-effects or adverse reactions reported. Compliance: No problems identified. Effectiveness: Clinically acceptable. Plan: Refer to "POC". UDS:  Summary  Date Value Ref Range Status  07/30/2019 Note  Final    Comment:    ==================================================================== Compliance Drug Analysis, Ur ==================================================================== Test                             Result       Flag       Units Drug Present not Declared for Prescription Verification   Hydromorphone                  1757         UNEXPECTED ng/mg creat    Hydromorphone may be administered as a scheduled prescription    medication; it is also an expected metabolite of hydrocodone.   Naproxen                       PRESENT      UNEXPECTED Drug Absent but Declared for Prescription Verification   Salicylate                     Not Detected UNEXPECTED    Aspirin, as indicated in the declared medication list, is not always    detected even when used as directed. ==================================================================== Test                      Result    Flag   Units      Ref Range   Creatinine              182              mg/dL      >=40 ==================================================================== Declared Medications:  The flagging and interpretation on this report are based on the  following declared medications.  Unexpected results may arise from  inaccuracies in the declared medications.  **Note: The testing scope of this panel does not include small to  moderate amounts of these  reported medications:  Aspirin  **Note: The testing scope of this panel does not include the  following reported medications:  Albuterol (Ventolin HFA)  Atorvastatin (Lipitor)  Betamethasone (Lotrisone)  Carvedilol (Coreg)  Cholecalciferol  Clotrimazole (Lotrisone)  Hydrocortisone  Mirabegron (Myrbetriq)  Naloxegol (Movantik)  Oxybutynin (Ditropan)  Supplement ==================================================================== For clinical consultation, please call [phone]. ====================================================================    No results found for: "CBDTHCR", "D8THCCBX", "D9THCCBX"   H&P (Pre-op Assessment):  Ms. Willner is a 77 y.o. (year old), female patient, seen today for interventional treatment. She  has a past surgical history that includes Carpal tunnel release (Bilateral); Back surgery; Breast surgery (Bilateral); Cholecystectomy; Colonoscopy w/ polypectomy; Cataract extraction w/ intraocular lens implant (Right); Abdominal hysterectomy; Fracture surgery (Right); Cardiac catheterization (2012); and Pain pump revision (Left, 07/07/2017). Ms. Sui has a current medication list which includes the following prescription(s): atorvastatin, carvedilol, celecoxib, diethylpropion hcl cr, losartan, meloxicam, naloxone, NON FORMULARY, omeprazole, oxybutynin, ssd, albuterol, and naloxegol oxalate. Her primarily concern today is the Leg Pain (right)  Initial Vital Signs:  Pulse/HCG Rate: 70  Temp: (!) 97.1  F (36.2 C) Resp: 14 BP: (!) 187/73 SpO2: 98 %  BMI: Estimated body mass index is 28.53 kg/m as calculated from the following:   Height as of this encounter: 5\' 2"  (1.575 m).   Weight as of this encounter: 156 lb (70.8 kg).  Risk Assessment: Allergies: Reviewed. She is allergic to doxycycline hyclate and sulfa antibiotics.  Allergy Precautions: None required Coagulopathies: Reviewed. None identified.  Blood-thinner therapy: None at this  time Active Infection(s): Reviewed. None identified. Ms. Palmberg is afebrile  Site Confirmation: Ms. Gaba was asked to confirm the procedure and laterality before marking the site Procedure checklist: Completed Consent: Before the procedure and under the influence of no sedative(s), amnesic(s), or anxiolytics, the patient was informed of the treatment options, risks and possible complications. To fulfill our ethical and legal obligations, as recommended by the American Medical Association's Code of Ethics, I have informed the patient of my clinical impression; the nature and purpose of the treatment or procedure; the risks, benefits, and possible complications of the intervention; the alternatives, including doing nothing; the risk(s) and benefit(s) of the alternative treatment(s) or procedure(s); and the risk(s) and benefit(s) of doing nothing.  Ms. Bresler was provided with information about the general risks and possible complications associated with most interventional procedures. These include, but are not limited to: failure to achieve desired goals, infection, bleeding, organ or nerve damage, allergic reactions, paralysis, and/or death.  In addition, she was informed of those risks and possible complications associated to this particular procedure, which include, but are not limited to: damage to the implant; failure to decrease pain; local, systemic, or serious CNS infections, intraspinal abscess with possible cord compression and paralysis, or life-threatening such as meningitis; bleeding; organ damage; nerve injury or damage with subsequent sensory, motor, and/or autonomic system dysfunction, resulting in transient or permanent pain, numbness, and/or weakness of one or several areas of the body; allergic reactions, either minor or major life-threatening, such as anaphylactic or anaphylactoid reactions.  Furthermore, Ms. Watt was informed of those risks and complications associated with  the medications. These include, but are not limited to: allergic reactions (i.e.: anaphylactic or anaphylactoid reactions); endorphine suppression; bradycardia and/or hypotension; water retention and/or peripheral vascular relaxation leading to lower extremity edema and possible stasis ulcers; respiratory depression and/or shortness of breath; decreased metabolic rate leading to weight gain; swelling or edema; medication-induced neural toxicity; particulate matter embolism and blood vessel occlusion with resultant organ, and/or nervous system infarction; and/or intrathecal granuloma formation with possible spinal cord compression and permanent paralysis.  Before refilling the pump Ms. Shibata was informed that some of the medications used in the devise may not be FDA approved for such use and therefore it constitutes an off-label use of the medications.  Finally, she was informed that Medicine is not an exact science; therefore, there is also the possibility of unforeseen or unpredictable risks and/or possible complications that may result in a catastrophic outcome. The patient indicated having understood very clearly. We have given the patient no guarantees and we have made no promises. Enough time was given to the patient to ask questions, all of which were answered to the patient's satisfaction. Ms. Capehart has indicated that she wanted to continue with the procedure. Attestation: I, the ordering provider, attest that I have discussed with the patient the benefits, risks, side-effects, alternatives, likelihood of achieving goals, and potential problems during recovery for the procedure that I have provided informed consent. Date  Time: 03/14/2023  1:07 PM  Pre-Procedure Preparation:  Monitoring: As per clinic protocol. Respiration, ETCO2, SpO2, BP, heart rate and rhythm monitor placed and checked for adequate function Safety Precautions: Patient was assessed for positional comfort and pressure points  before starting the procedure. Time-out: I initiated and conducted the "Time-out" before starting the procedure, as per protocol. The patient was asked to participate by confirming the accuracy of the "Time Out" information. Verification of the correct person, site, and procedure were performed and confirmed by me, the nursing staff, and the patient. "Time-out" conducted as per Joint Commission's Universal Protocol (UP.01.01.01). Time: 1327 Start Time: 1327 hrs.  Description of Procedure:          Position: Supine Target Area: Central-port of intrathecal pump. Approach: Anterior, 90 degree angle approach. Area Prepped: Entire Area around the pump implant. ChloraPrep (2% chlorhexidine gluconate and 70% isopropyl alcohol) Safety Precautions: Aspiration looking for blood return was conducted prior to all injections. At no point did we inject any substances, as a needle was being advanced. No attempts were made at seeking any paresthesias. Safe injection practices and needle disposal techniques used. Medications properly checked for expiration dates. SDV (single dose vial) medications used. Description of the Procedure: Protocol guidelines were followed. Two nurses trained to do implant refills were present during the entire procedure. The refill medication was checked by both healthcare providers as well as the patient. The patient was included in the "Time-out" to verify the medication. The patient was placed in position. The pump was identified. The area was prepped in the usual manner. The sterile template was positioned over the pump, making sure the side-port location matched that of the pump. Both, the pump and the template were held for stability. The needle provided in the Medtronic Kit was then introduced thru the center of the template and into the central port. The pump content was aspirated and discarded volume documented. The new medication was slowly infused into the pump, thru the filter,  making sure to avoid overpressure of the device. The needle was then removed and the area cleansed, making sure to leave some of the prepping solution back to take advantage of its long term bactericidal properties. The pump was interrogated and programmed to reflect the correct medication, volume, and dosage. The program was printed and taken to the physician for approval. Once checked and signed by the physician, a copy was provided to the patient and another scanned into the EMR.  Vitals:   03/14/23 1306  BP: (!) 187/73  Pulse: 70  Resp: 14  Temp: (!) 97.1 F (36.2 C)  TempSrc: Temporal  SpO2: 98%  Weight: 156 lb (70.8 kg)  Height: 5\' 2"  (1.575 m)    Start Time: 1327 hrs. End Time: 1333 hrs. Materials & Medications: Medtronic Refill Kit Medication(s): Please see chart orders for details.  Type of Imaging Technique: None used Indication(s): N/A Exposure Time: No patient exposure Contrast: None used. Fluoroscopic Guidance: N/A Ultrasound Guidance: N/A Interpretation: N/A  Antibiotic Prophylaxis:   Anti-infectives (From admission, onward)    None      Indication(s): None identified  Post-operative Assessment:  Post-procedure Vital Signs:  Pulse/HCG Rate: 70  Temp: (!) 97.1 F (36.2 C) Resp: 14 BP: (!) 187/73 SpO2: 98 %  EBL: None  Complications: No immediate post-treatment complications observed by team, or reported by patient.  Note: The patient tolerated the entire procedure well. A repeat set of vitals were taken after the procedure and the patient was kept under observation following institutional policy, for this type of  procedure. Post-procedural neurological assessment was performed, showing return to baseline, prior to discharge. The patient was provided with post-procedure discharge instructions, including a section on how to identify potential problems. Should any problems arise concerning this procedure, the patient was given instructions to immediately  contact us, at any time, without hesitation. In any case, we plan to contact the patient by telephone for a follow-up status report regarding this interventional procedure.  Comments:  No additional relevant information.  Plan of Care (POC)  Orders:  Orders Placed This Encounter  Procedures   PUMP REFILL    Maintain Protocol by having two(2) healthcare providers during procedure and programming.    Scheduling Instructions:     Please refill intrathecal pump today.    Order Specific Question:   Where will this procedure be performed?    Answer:   [facility]   PUMP REFILL    Whenever possible schedule on a procedure today.    Standing Status:   Future    Standing Expiration Date:   07/14/2023    Scheduling Instructions:     Please schedule intrathecal pump refill based on pump programming. Avoid schedule intervals of more than 120 days (4 months).    Order Specific Question:   Where will this procedure be performed?    Answer:   [facility]   Informed Consent Details: Physician/Practitioner Attestation; Transcribe to consent form and obtain patient signature    Transcribe to consent form and obtain patient signature.    Order Specific Question:   Physician/Practitioner attestation of informed consent for procedure/surgical case    Answer:   I, the physician/practitioner, attest that I have discussed with the patient the benefits, risks, side effects, alternatives, likelihood of achieving goals and potential problems during recovery for the procedure that I have provided informed consent.    Order Specific Question:   Procedure    Answer:   Intrathecal pump refill    Order Specific Question:   Physician/Practitioner performing the procedure    Answer:   Attending Physician: Sydnee Levans. Laban Emperor, MD & designated trained staff    Order Specific Question:   Indication/Reason    Answer:   Chronic Pain Syndrome (G89.4), presence of an intrathecal pump (Z97.8)   Chronic  Opioid Analgesic:  No other oral opioid analgesics prescribed by our practice. Intrathecal PF-Hydromorphone 3.9295 mg/day + PF-Sufentanil 34.383 mcg/day. MME: Aprox. 58.7 mg/day.   Medications ordered for procedure: No orders of the defined types were placed in this encounter.  Medications administered: Andrey Farmer had no medications administered during this visit.  See the medical record for exact dosing, route, and time of administration.  Follow-up plan:   Return for Pump Refill (Max:90mo).       Interventional Therapies  Risk  Complexity Considerations:   Presence of intrathecal pump  DM  CAD  Pulmonary hypertension    Planned  Pending:   Diagnostic/therapeutic bilateral lumbar (L4-5, L5-S1) facet MBB L3R1  Palliative intrathecal pump refills    Under consideration:   Diagnostic left lumbar facet MBB #3  Possible left lumbar facet RFA #1   Completed:   Diagnostic/therapeutic right caudal ESI x1 (01/04/2022) (100/100/100/100) (of right leg pain)  Diagnostic left lumbar facet MBB x2 (12/22/2016) (07/14/2020) (100/100/100/100) (of left low back pain)    Therapeutic  Palliative (PRN) options:   Therapeutic/palliative left sided lumbar facet MBB #3            Recent Visits Date Type Provider Dept  01/03/23 Procedure visit  Delano Metz, MD [facility]  Showing recent visits within past 90 days and meeting all other requirements Today's Visits Date Type Provider Dept  03/14/23 Procedure visit Delano Metz, MD [facility]  Showing today's visits and meeting all other requirements Future Appointments Date Type Provider Dept  05/23/23 Appointment Delano Metz, MD [facility]  Showing future appointments within next 90 days and meeting all other requirements  Disposition: Discharge home  Discharge (Date  Time): 03/14/2023; 1345 hrs.   Primary Care Physician: Chauncy Lean, PA-C Location: [facility] Note by: Oswaldo Done, MD (TTS technology used. I apologize for any typographical errors that were not detected and corrected.) Date: 03/14/2023; Time: 1:53 PM  Disclaimer:  Medicine is not an Visual merchandiser. The only guarantee in medicine is that nothing is guaranteed. It is important to note that the decision to proceed with this intervention was based on the information collected from the patient. The Data and conclusions were drawn from the patient's questionnaire, the interview, and the physical examination. Because the information was provided in large part by the patient, it cannot be guaranteed that it has not been purposely or unconsciously manipulated. Every effort has been made to obtain as much relevant data as possible for this evaluation. It is important to note that the conclusions that lead to this procedure are derived in large part from the available data. Always take into account that the treatment will also be dependent on availability of resources and existing treatment guidelines, considered by other Pain Management Practitioners as being common knowledge and practice, at the time of the intervention. For Medico-Legal purposes, it is also important to point out that variation in procedural techniques and pharmacological choices are the acceptable norm. The indications, contraindications, technique, and results of the above procedure should only be interpreted and judged by a Board-Certified Interventional Pain Specialist with extensive familiarity and expertise in the same exact procedure and technique.

## 2023-03-15 ENCOUNTER — Telehealth: Payer: Self-pay

## 2023-03-15 NOTE — Telephone Encounter (Signed)
Post procedure follow up.  Patient son states she is doing good.

## 2023-03-29 MED FILL — Medication: INTRATHECAL | Qty: 1 | Status: AC

## 2023-04-26 ENCOUNTER — Ambulatory Visit
Admission: RE | Admit: 2023-04-26 | Discharge: 2023-04-26 | Disposition: A | Discharge status: Home / Self Care | Payer: Medicare Other | Source: Ambulatory Visit | Attending: Pain Medicine | Admitting: Pain Medicine

## 2023-04-26 ENCOUNTER — Ambulatory Visit (HOSPITAL_BASED_OUTPATIENT_CLINIC_OR_DEPARTMENT_OTHER): Payer: Medicare Other | Admitting: Pain Medicine

## 2023-04-26 ENCOUNTER — Encounter: Payer: Self-pay | Admitting: Pain Medicine

## 2023-04-26 VITALS — BP 177/79 | HR 64 | Temp 97.5°F | Resp 18 | Ht 62.5 in | Wt 160.0 lb

## 2023-04-26 DIAGNOSIS — G8929 Other chronic pain: Secondary | ICD-10-CM

## 2023-04-26 DIAGNOSIS — M47816 Spondylosis without myelopathy or radiculopathy, lumbar region: Secondary | ICD-10-CM | POA: Insufficient documentation

## 2023-04-26 DIAGNOSIS — Z79891 Long term (current) use of opiate analgesic: Secondary | ICD-10-CM

## 2023-04-26 DIAGNOSIS — Z79899 Other long term (current) drug therapy: Secondary | ICD-10-CM

## 2023-04-26 DIAGNOSIS — M961 Postlaminectomy syndrome, not elsewhere classified: Secondary | ICD-10-CM | POA: Insufficient documentation

## 2023-04-26 DIAGNOSIS — Z978 Presence of other specified devices: Secondary | ICD-10-CM | POA: Insufficient documentation

## 2023-04-26 DIAGNOSIS — F1190 Opioid use, unspecified, uncomplicated: Secondary | ICD-10-CM

## 2023-04-26 DIAGNOSIS — M545 Low back pain, unspecified: Secondary | ICD-10-CM

## 2023-04-26 DIAGNOSIS — G894 Chronic pain syndrome: Secondary | ICD-10-CM | POA: Insufficient documentation

## 2023-04-26 NOTE — Patient Instructions (Addendum)
Facet Blocks Patient Information  Description: The facets are joints in the spine between the vertebrae.  Like any joints in the body, facets can become irritated and painful.  Arthritis can also effect the facets.  By injecting steroids and local anesthetic in and around these joints, we can temporarily block the nerve supply to them.  Steroids act directly on irritated nerves and tissues to reduce selling and inflammation which often leads to decreased pain.  Facet blocks may be done anywhere along the spine from the neck to the low back depending upon the location of your pain.   After numbing the skin with local anesthetic (like Novocaine), a small needle is passed onto the facet joints under x-ray guidance.  You may experience a sensation of pressure while this is being done.  The entire block usually lasts about 15-25 minutes.   Conditions which may be treated by facet blocks:  Low back/buttock pain Neck/shoulder pain Certain types of headaches  Preparation for the injection:  Do not eat any solid food or dairy products within 8 hours of your appointment. You may drink clear liquid up to 3 hours before appointment.  Clear liquids include water, black coffee, juice or soda.  No milk or cream please. You may take your regular medication, including pain medications, with a sip of water before your appointment.  Diabetics should hold regular insulin (if taken separately) and take 1/2 normal NPH dose the morning of the procedure.  Carry some sugar containing items with you to your appointment. A driver must accompany you and be prepared to drive you home after your procedure. Bring all your current medications with you. An IV may be inserted and sedation may be given at the discretion of the physician. A blood pressure cuff, EKG and other monitors will often be applied during the procedure.  Some patients may need to have extra oxygen administered for a short period. You will be asked to  provide medical information, including your allergies and medications, prior to the procedure.  We must know immediately if you are taking blood thinners (like Coumadin/Warfarin) or if you are allergic to IV iodine contrast (dye).  We must know if you could possible be pregnant.  Possible side-effects:  Bleeding from needle site Infection (rare, may require surgery) Nerve injury (rare) Numbness & tingling (temporary) Difficulty urinating (rare, temporary) Spinal headache (a headache worse with upright posture) Light-headedness (temporary) Pain at injection site (serveral days) Decreased blood pressure (rare, temporary) Weakness in arm/leg (temporary) Pressure sensation in back/neck (temporary)   Call if you experience:  Fever/chills associated with headache or increased back/neck pain Headache worsened by an upright position New onset, weakness or numbness of an extremity below the injection site Hives or difficulty breathing (go to the emergency room) Inflammation or drainage at the injection site(s) Severe back/neck pain greater than usual New symptoms which are concerning to you  Please note:  Although the local anesthetic injected can often make your back or neck feel good for several hours after the injection, the pain will likely return. It takes 3-7 days for steroids to work.  You may not notice any pain relief for at least one week.  If effective, we will often do a series of 2-3 injections spaced 3-6 weeks apart to maximally decrease your pain.  After the initial series, you may be a candidate for a more permanent nerve block of the facets.  If you have any questions, please call #[phone]) [phone] [facility]  Pain Clinic ______________________________________________________________________    Procedure instructions  Stop blood-thinners  Do not eat or drink fluids (other than water) for 6 hours before your procedure  No water for 2 hours before  your procedure  Take your blood pressure medicine with a sip of water  Arrive 30 minutes before your appointment  If sedation is planned, bring suitable driver. Pennie Banter, Benedetto Goad, & public transportation are NOT APPROVED)  Carefully read the "Preparing for your procedure" detailed instructions  If you have questions call us at [phone]  Procedure appointments are for procedures only. NO medication refills or new problem evaluations.   ______________________________________________________________________      ______________________________________________________________________    Preparing for your procedure  Appointments: If you think you may not be able to keep your appointment, call 24-48 hours in advance to cancel. We need time to make it available to others.  Procedure visits are for procedures only. During your procedure appointment there will be: NO Prescription Refills*. NO medication changes or discussions*. NO discussion of disability issues*. NO unrelated pain problem evaluations*. NO evaluations to order other pain procedures*. *These will be addressed at a separate and distinct evaluation encounter on the provider's evaluation schedule and not during procedure days.  Instructions: Food intake: Avoid eating anything solid for at least 8 hours prior to your procedure. Clear liquid intake: You may take clear liquids such as water up to 2 hours prior to your procedure. (No carbonated drinks. No soda.) Transportation: Unless otherwise stated by your physician, bring a driver. (Driver cannot be a Market researcher, Pharmacist, community, or any other form of public transportation.) Morning Medicines: Except for blood thinners, take all of your other morning medications with a sip of water. Make sure to take your heart and blood pressure medicines. If your blood pressure's lower number is above 100, the case will be rescheduled. Blood thinners: Make sure to stop your blood thinners as instructed.  If  you take a blood thinner, but were not instructed to stop it, call our office [phone] and ask to talk to a nurse. Not stopping a blood thinner prior to certain procedures could lead to serious complications. Diabetics on insulin: Notify the staff so that you can be scheduled 1st case in the morning. If your diabetes requires high dose insulin, take only  of your normal insulin dose the morning of the procedure and notify the staff that you have done so. Preventing infections: Shower with an antibacterial soap the morning of your procedure.  Build-up your immune system: Take 1000 mg of Vitamin C with every meal (3 times a day) the day prior to your procedure. Antibiotics: Inform the nursing staff if you are taking any antibiotics or if you have any conditions that may require antibiotics prior to procedures. (Example: recent joint implants)   Pregnancy: If you are pregnant make sure to notify the nursing staff. Not doing so may result in injury to the fetus, including death.  Sickness: If you have a cold, fever, or any active infections, call and cancel or reschedule your procedure. Receiving steroids while having an infection may result in complications. Arrival: You must be in the facility at least 30 minutes prior to your scheduled procedure. Tardiness: Your scheduled time is also the cutoff time. If you do not arrive at least 15 minutes prior to your procedure, you will be rescheduled.  Children: Do not bring any children with you. Make arrangements to keep them home. Dress appropriately: There is always a possibility that your  clothing may get soiled. Avoid long dresses. Valuables: Do not bring any jewelry or valuables.  Reasons to call and reschedule or cancel your procedure: (Following these recommendations will minimize the risk of a serious complication.) Surgeries: Avoid having procedures within 2 weeks of any surgery. (Avoid for 2 weeks before or after any surgery). Flu Shots: Avoid  having procedures within 2 weeks of a flu shots or . (Avoid for 2 weeks before or after immunizations). Barium: Avoid having a procedure within 7-10 days after having had a radiological study involving the use of radiological contrast. (Myelograms, Barium swallow or enema study). Heart attacks: Avoid any elective procedures or surgeries for the initial 6 months after a "Myocardial Infarction" (Heart Attack). Blood thinners: It is imperative that you stop these medications before procedures. Let us know if you if you take any blood thinner.  Infection: Avoid procedures during or within two weeks of an infection (including chest colds or gastrointestinal problems). Symptoms associated with infections include: Localized redness, fever, chills, night sweats or profuse sweating, burning sensation when voiding, cough, congestion, stuffiness, runny nose, sore throat, diarrhea, nausea, vomiting, cold or Flu symptoms, recent or current infections. It is specially important if the infection is over the area that we intend to treat. Heart and lung problems: Symptoms that may suggest an active cardiopulmonary problem include: cough, chest pain, breathing difficulties or shortness of breath, dizziness, ankle swelling, uncontrolled high or unusually low blood pressure, and/or palpitations. If you are experiencing any of these symptoms, cancel your procedure and contact your primary care physician for an evaluation.  Remember:  Regular Business hours are:  Monday to Thursday 8:00 AM to 4:00 PM  Provider's Schedule: Delano Metz, MD:  Procedure days: Tuesday and Thursday 7:30 AM to 4:00 PM  Edward Jolly, MD:  Procedure days: Monday and Wednesday 7:30 AM to 4:00 PM Last  Updated: 04/25/2023 ______________________________________________________________________      ______________________________________________________________________    General Risks and Possible Complications  Patient  Responsibilities: It is important that you read this as it is part of your informed consent. It is our duty to inform you of the risks and possible complications associated with treatments offered to you. It is your responsibility as a patient to read this and to ask questions about anything that is not clear or that you believe was not covered in this document.  Patient's Rights: You have the right to refuse treatment. You also have the right to change your mind, even after initially having agreed to have the treatment done. However, under this last option, if you wait until the last second to change your mind, you may be charged for the materials used up to that point.  Introduction: Medicine is not an Visual merchandiser. Everything in Medicine, including the lack of treatment(s), carries the potential for danger, harm, or loss (which is by definition: Risk). In Medicine, a complication is a secondary problem, condition, or disease that can aggravate an already existing one. All treatments carry the risk of possible complications. The fact that a side effects or complications occurs, does not imply that the treatment was conducted incorrectly. It must be clearly understood that these can happen even when everything is done following the highest safety standards.  No treatment: You can choose not to proceed with the proposed treatment alternative. The "PRO(s)" would include: avoiding the risk of complications associated with the therapy. The "CON(s)" would include: not getting any of the treatment benefits. These benefits fall under one of three categories:  diagnostic; therapeutic; and/or palliative. Diagnostic benefits include: getting information which can ultimately lead to improvement of the disease or symptom(s). Therapeutic benefits are those associated with the successful treatment of the disease. Finally, palliative benefits are those related to the decrease of the primary symptoms, without necessarily  curing the condition (example: decreasing the pain from a flare-up of a chronic condition, such as incurable terminal cancer).  General Risks and Complications: These are associated to most interventional treatments. They can occur alone, or in combination. They fall under one of the following six (6) categories: no benefit or worsening of symptoms; bleeding; infection; nerve damage; allergic reactions; and/or death. No benefits or worsening of symptoms: In Medicine there are no guarantees, only probabilities. No healthcare provider can ever guarantee that a medical treatment will work, they can only state the probability that it may. Furthermore, there is always the possibility that the condition may worsen, either directly, or indirectly, as a consequence of the treatment. Bleeding: This is more common if the patient is taking a blood thinner, either prescription or over the counter (example: Goody Powders, Fish oil, Aspirin, Garlic, etc.), or if suffering a condition associated with impaired coagulation (example: Hemophilia, cirrhosis of the liver, low platelet counts, etc.). However, even if you do not have one on these, it can still happen. If you have any of these conditions, or take one of these drugs, make sure to notify your treating physician. Infection: This is more common in patients with a compromised immune system, either due to disease (example: diabetes, cancer, human immunodeficiency virus [HIV], etc.), or due to medications or treatments (example: therapies used to treat cancer and rheumatological diseases). However, even if you do not have one on these, it can still happen. If you have any of these conditions, or take one of these drugs, make sure to notify your treating physician. Nerve Damage: This is more common when the treatment is an invasive one, but it can also happen with the use of medications, such as those used in the treatment of cancer. The damage can occur to small secondary  nerves, or to large primary ones, such as those in the spinal cord and brain. This damage may be temporary or permanent and it may lead to impairments that can range from temporary numbness to permanent paralysis and/or brain death. Allergic Reactions: Any time a substance or material comes in contact with our body, there is the possibility of an allergic reaction. These can range from a mild skin rash (contact dermatitis) to a severe systemic reaction (anaphylactic reaction), which can result in death. Death: In general, any medical intervention can result in death, most of the time due to an unforeseen complication. ______________________________________________________________________

## 2023-04-26 NOTE — Progress Notes (Signed)
PROVIDER NOTE: Information contained herein reflects review and annotations entered in association with encounter. Interpretation of such information and data should be left to medically-trained personnel. Information provided to patient can be located elsewhere in the medical record under "Patient Instructions". Document created using STT-dictation technology, any transcriptional errors that may result from process are unintentional.    Patient: Tiffany Hunter  Service Category: E/M  Provider: Oswaldo Done, MD  DOB: 03/03/1946  DOS: 04/26/2023  Referring Provider: Vonna Kotyk  MRN: [id number]  Specialty: Interventional Pain Management  PCP: Chauncy Lean, PA-C  Type: Established Patient  Setting: Ambulatory outpatient    Location: Office  Delivery: Face-to-face     HPI  Tiffany Hunter, a 77 y.o. year old female, is here today because of her Chronic pain syndrome [G89.4]. 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); Chronic lumbar radicular pain (L4 Dermatome) (Left); Coccygodynia; Chronic hip pain (Left); Osteoarthritis of hip (Left); Chronic knee pain (Left); Osteoarthritis of knee (Left); Thoracic bulging disc without myelopathy; Generalized weakness; Chronic lower extremity pain (2ry area of Pain) (Left); Chronic sacroiliac joint pain (3ry area of Pain) (Left); Generalized abdominal pain; Right elbow pain; Lumbar facet joint syndrome (Left); Spondylosis without myelopathy or radiculopathy, lumbosacral region; DDD (degenerative disc disease), lumbosacral; Chronic low back pain (Left) w/o sciatica; Osteoarthritis of lumbar spine; Hyperalgesia; Shoulder arthritis; H/O total shoulder replacement, left; Spondylosis of  lumbar spine; Pancreatic mass; Chronic lower extremity pain (Right); Chronic low back pain (Bilateral) w/ sciatica (Right); Chronic lower extremity radicular pain (S1) (Right); and Chronic low back pain (Bilateral) w/o sciatica on their pertinent problem list. Pain Assessment: Severity of Chronic pain is reported as a 8 /10. Location: Back Right, Left, Lower/denies. Onset: More than a month ago. Quality: Aching, Dull, Discomfort, Sore, Sharp. Timing: Constant. Modifying factor(s): nothing at this point, has tried rest, heat. Vitals:  height is 5' 2.5" (1.588 m) and weight is 160 lb (72.6 kg). Her temperature is 97.5 F (36.4 C) (abnormal). Her blood pressure is 177/79 (abnormal) and her pulse is 64. Her respiration is 18 and oxygen saturation is 98%.  BMI: Estimated body mass index is 28.8 kg/m as calculated from the following:   Height as of this encounter: 5' 2.5" (1.588 m).   Weight as of this encounter: 160 lb (72.6 kg). Last encounter: Visit date not found. Last procedure: 03/14/2023.  Reason for encounter: evaluation of worsening, or previously known (established) problem.  Discussed the use of AI scribe software for clinical note transcription with the patient, who gave verbal consent to proceed.  History of Present Illness   The patient, with a history of lumbar facet joint issues, reports a recent fall at home while moving clothes in their closet. The fall resulted in the patient landing on their back, and since then, they have been experiencing persistent lower back pain. The pain has been severe enough to disrupt their sleep, often keeping them awake until the early hours of the morning. Despite the pain, the patient has continued to remain active, fearing becoming bedridden.  The patient attempted to manage the pain with a heat pad and rest, but these measures have not provided relief. The pain is localized to the lower back and is particularly noticeable when the patient is getting up  or lying down. There are no new symptoms of leg pain, numbness, or weakness reported.  The patient denies having diabetes and states they have never taken any medication for it. They do, however, take medication for high cholesterol. They do not take any blood thinners or blood pressure medication. The primary concern expressed by the patient is their inability to rest due to the persistent lower back pain.     Pharmacotherapy Assessment  Analgesic: No other oral opioid analgesics prescribed by our practice. Intrathecal PF-Hydromorphone 3.9295 mg/day + PF-Sufentanil 34.383 mcg/day. MME: [employer]. 58.7 mg/day.   Monitoring: [place] PMP: PDMP reviewed during this encounter.       Pharmacotherapy: No side-effects or adverse reactions reported. Compliance: No problems identified. Effectiveness: Clinically acceptable.  [name], RN  04/26/2023  2:11 PM  Sign when Signing Visit Safety precautions to be maintained throughout the outpatient stay will include: orient to surroundings, keep bed in low position, maintain call bell within reach at all times, provide assistance with transfer out of bed and ambulation.     No results found for: "CBDTHCR" No results found for: "D8THCCBX" No results found for: "D9THCCBX"  UDS:  Summary  Date Value Ref Range Status  07/30/2019 Note  Final    Comment:    ==================================================================== Compliance Drug Analysis, Ur ==================================================================== Test                             Result       Flag       Units Drug Present not Declared for Prescription Verification   Hydromorphone                  1757         UNEXPECTED ng/mg creat    Hydromorphone may be administered as a scheduled prescription    medication; it is also an expected metabolite of hydrocodone.   Naproxen                       PRESENT      UNEXPECTED Drug Absent but Declared for Prescription Verification    Salicylate                     Not Detected UNEXPECTED    Aspirin, as indicated in the declared medication list, is not always    detected even when used as directed. ==================================================================== Test                      Result    Flag   Units      Ref Range   Creatinine              182              mg/dL      >=16 ==================================================================== Declared Medications:  The flagging and interpretation on this report are based on the  following declared medications.  Unexpected results may arise from  inaccuracies in the declared medications.  **Note: The testing scope of this panel does not include small to  moderate amounts of these reported medications:  Aspirin  **Note: The testing scope of this panel does not include the  following reported medications:  Albuterol (Ventolin HFA)  Atorvastatin (Lipitor)  Betamethasone (Lotrisone)  Carvedilol (Coreg)  Cholecalciferol  Clotrimazole (Lotrisone)  Hydrocortisone  Mirabegron (Myrbetriq)  Naloxegol (Movantik)  Oxybutynin (Ditropan)  Supplement ==================================================================== For clinical consultation,  please call [phone]. ====================================================================       ROS  Constitutional: Denies any fever or chills Gastrointestinal: No reported hemesis, hematochezia, vomiting, or acute GI distress Musculoskeletal: Denies any acute onset joint swelling, redness, loss of ROM, or weakness Neurological: No reported episodes of acute onset apraxia, aphasia, dysarthria, agnosia, amnesia, paralysis, loss of coordination, or loss of consciousness  Medication Review  Diethylpropion HCl CR, NON FORMULARY, albuterol, atorvastatin, carvedilol, celecoxib, losartan, meloxicam, naloxegol oxalate, naloxone, omeprazole, oxybutynin, and silver sulfADIAZINE  History Review  Allergy: Tiffany Hunter is  allergic to doxycycline hyclate and sulfa antibiotics. Drug: Tiffany Hunter  reports no history of drug use. Alcohol:  reports no history of alcohol use. Tobacco:  reports that she has never smoked. She has never used smokeless tobacco. Social: Tiffany Hunter  reports that she has never smoked. She has never used smokeless tobacco. She reports that she does not drink alcohol and does not use drugs. 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 Surgical: Tiffany Hunter  has a past surgical history that includes Carpal tunnel release (Bilateral); Back surgery; Breast surgery (Bilateral); Cholecystectomy; Colonoscopy w/ polypectomy; Cataract extraction w/ intraocular lens implant (Right); Abdominal hysterectomy; Fracture surgery (Right); Cardiac catheterization (2012); and Pain pump revision (Left, 07/07/2017). Family: family history includes Cancer in her mother; Colon cancer in an other family member; Coronary artery disease in an other family member; Heart disease in her father.  Laboratory Chemistry Profile   Renal Lab Results  Component Value Date   BUN 8 07/30/2019   CREATININE 0.56 (L) 07/30/2019   BCR 14 07/30/2019   GFR 113.32 12/16/2010   GFRAA 107 07/30/2019   GFRNONAA 93 07/30/2019    Hepatic Lab Results  Component Value Date   AST 14 07/30/2019   ALT 23 10/29/2015   ALBUMIN 3.9 07/30/2019    ALKPHOS 91 07/30/2019    Electrolytes Lab Results  Component Value Date   NA 143 07/30/2019   K 3.8 07/30/2019   CL 100 07/30/2019   CALCIUM 8.7 07/30/2019   MG 1.7 07/30/2019    Bone Lab Results  Component Value Date   25OHVITD1 42 07/30/2019   25OHVITD2 1.1 07/30/2019   25OHVITD3 41 07/30/2019    Inflammation (CRP: Acute Phase) (ESR: Chronic Phase) Lab Results  Component Value Date   CRP 12 (H) 07/30/2019   ESRSEDRATE 18 07/30/2019         Note: Above Lab results reviewed.  Recent Imaging Review  DG PAIN CLINIC C-ARM 1-60 MIN NO REPORT Fluoro was used, but no Radiologist interpretation will be provided.  Please refer to "NOTES" tab for provider progress note. Note: Reviewed        Physical Exam  General appearance: Well nourished, well developed, and well hydrated. In no apparent acute distress Mental status: Alert, oriented x 3 (person, place, & time)       Respiratory: No evidence of acute respiratory distress Eyes: PERLA Vitals: BP (!) 177/79   Pulse 64   Temp (!) 97.5 F (36.4 C)   Resp 18   Ht 5' 2.5" (1.588 m)  Wt 160 lb (72.6 kg)   SpO2 98%   BMI 28.80 kg/m  BMI: Estimated body mass index is 28.8 kg/m as calculated from the following:   Height as of this encounter: 5' 2.5" (1.588 m).   Weight as of this encounter: 160 lb (72.6 kg). Ideal: Ideal body weight: 51.3 kg (112 lb 15.8 oz) Adjusted ideal body weight: 59.8 kg (131 lb 12.7 oz)  Assessment   Diagnosis Status  1. Chronic pain syndrome   2. Chronic low back pain (Left) w/o sciatica   3. Lumbar facet joint syndrome (Left)   4. 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 Pharmacologic therapy   6. Chronic narcotic use   7. Chronic use of opiate for therapeutic purpose   8. 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 Encounter for chronic pain management   10. Chronic low back pain (Bilateral) w/o sciatica    Controlled Controlled Controlled    Updated Problems: Problem  Chronic low back pain (Bilateral) w/o sciatica  Oa (Osteoarthritis)   Formatting of this note might be different from the original. Problem Story: Hips, Knee   Formatting of this note might be different from the original. Formatting of this note might be different from the original. Formatting of this note might be different from the original. Problem Story: Hips, Knee Formatting of this note might be different from the original. Problem Story: Hips, Knee  Problem Story: Hips, Knee  Problem Story: Hips, Knee   Lateral Epicondylitis (Tennis Elbow)  Barrett's Esophagus   Last Assessment & Plan:  Formatting of this note might be different from the original. zzzz   Gastroesophageal Reflux Disease Without Esophagitis   Last Assessment & Plan:  Formatting of this note might be different from the original. aaaaa   Osteopenia Determined By X-Ray   The patient was observed to have osteopenia during fluoroscopy used for interventional treatments at the lumbar spine.  The patient was observed to have osteopenia during fluoroscopy used for  interventional treatments at the lumbar spine.   Urge Incontinence   Overview:    Urge only Significant atrophy present. Scarring and skin bridge present from prior repair. No notable prolapse Also with DM, prior back surgery, and chronic narcotics. Any of these may contribute Start low dose meds and RTC in 4wks for f/u   Formatting of this note might be different from the original.   Urge only Significant atrophy present. Scarring and skin bridge present from prior repair. No notable prolapse Also with DM, prior back surgery, and chronic narcotics. Any of these may contribute Start low dose meds and RTC in 4wks for f/u  Urge only Significant atrophy present. Scarring and skin bridge present from prior  repair. No notable prolapse Also with DM, prior back surgery, and chronic narcotics. Any of these may   contribute Start low dose meds and RTC in 4wks for f/u   Chronic Narcotic Use   Opioid Analgesics:  Oral: Tramadol (Ultram) 50 mg every 6 hours (200 mg/day). (20  MME/Day) (D/C'ed) Intrathecal: PF-hydromorphone 8.0 mg/mL + PF-sufentanil 70 g/mL. Running with a simple continuous infusion at 3.5704 mg/day of hydromorphone +31.241 g/day of sufentanil. Total MME/Day: 58.7 mg/day  Fentanyl / sufentanyl intrathecal pain pump Dr. Reyne Dumas / [place] at [facility] is prescribing MD   Pre-Op Examination  S/P Hysterectomy   Done in 20's as "periods just stopped" - thought she had ut cancer - no chemo or XRT S/p hyst / LSO;  right ovary remains   Formatting of this note might be different from the original. Done in 20's as "periods just stopped" - thought she had ut cancer - no chemo or XRT S/p hyst / LSO; right ovary remains  Done in 20's as "periods just stopped" - thought she had ut cancer - no  chemo or XRT S/p hyst / LSO; right ovary remains     Plan of Care  Problem-specific:  Assessment and Plan    Lower Back Pain Following a fall while moving clothes, they present with acute lower back pain, localized without radiation, numbness, or weakness. Considering their age and potential for osteoporosis, differential diagnosis includes lumbar facet joint pain and vertebral fracture, underscoring the importance of a timely diagnosis to prevent complications. We will order lumbar spine x-rays and plan treatment based on the x-ray results.  General Health Maintenance Despite a previous chart entry suggesting otherwise, they have not been diagnosed with diabetes. They are currently taking medication for hyperlipidemia but not for hypertension. We will remove the incorrect diabetes diagnosis from their chart.  Follow-up The nurse will provide them with instructions for the x-ray procedure. They will proceed to the main lobby for the x-ray with a bracelet.       Tiffany Hunter has a  current medication list which includes the following long-term medication(s): albuterol, carvedilol, diethylpropion hcl cr, losartan, and naloxegol oxalate.  Pharmacotherapy (Medications Ordered): No orders of the defined types were placed in this encounter.  Orders:  Orders Placed This Encounter  Procedures   LUMBAR FACET(MEDIAL BRANCH NERVE BLOCK) MBNB    Diagnosis: Lumbar Facet Syndrome (M47.816); Lumbosacral Facet Syndrome (M47.817); Lumbar Facet Joint Pain (M54.59) Medical Necessity Statement: 1.Severe chronic axial low back pain causing functional impairment documented by ongoing pain scale assessments. 2.Pain present for longer than 3 months (Chronic) documented to have failed noninvasive conservative therapies. 3.Absence of untreated radiculopathy. 4.There is no radiological evidence of untreated fractures, tumor, infection, or deformity.  Physical Examination Findings: Positive Kemp Maneuver: (Y)  Positive Lumbar Hyperextension-Rotation provocative test: (Y)    Standing Status:   Future    Standing Expiration Date:   07/25/2023    Scheduling Instructions:     Procedure: Lumbar facet Block     Type: Medial Branch Block     Side: Bilateral     Purpose: Therapeutic     Level(s): L3-4, L4-5, L5-S1, and TBD by Fluoroscopic Mapping Facets (L2, L3, L4, L5, S1, and TBD Medial Branch)     Sedation: Patient's choice.     Timeframe: ASAA    Order Specific Question:   Where will this procedure be performed?    Answer:   [facility]   DG Lumbar Spine Complete W/Bend    Patient presents with axial pain with possible radicular component. Please assist [place] in identifying specific level(s) and laterality of any additional findings such as: 1. Facet (Zygapophyseal) joint DJD (Hypertrophy, space narrowing, subchondral sclerosis, and/or osteophyte formation) 2. DDD and/or IVDD (Loss of disc height, desiccation, gas patterns, osteophytes, endplate sclerosis, or "Black disc disease") 3.  Pars defects 4. Spondylolisthesis, spondylosis, and/or spondyloarthropathies (include Degree/Grade of displacement in mm) (stability) 5. Vertebral body Fractures (acute/chronic) (state percentage of collapse) 6. Demineralization (osteopenia/osteoporotic) 7. Bone pathology 8. Foraminal narrowing  9. Surgical changes    Standing Status:   Future    Number of Occurrences:   1    Standing Expiration Date:   07/25/2023    Scheduling Instructions:  Please make sure that the patient understands that this needs to be done as soon as possible. Never have the patient do the imaging "just before the next appointment". Inform patient that having the imaging done within the [facility] will expedite the availability of the results and will provide      imaging availability to the requesting physician. In addition inform the patient that the imaging order has an expiration date and will not be renewed if not done within the active period.    Order Specific Question:   Reason for Exam (SYMPTOM  OR DIAGNOSIS REQUIRED)    Answer:   Low back pain    Order Specific Question:   Preferred imaging location?    Answer:   [facility]    Order Specific Question:   Call Results- Best Contact Number?    Answer:   [phone] [place]    Order Specific Question:   Radiology Contrast Protocol - do NOT remove file path    Answer:   [email or web address]    Order Specific Question:   Release to patient    Answer:   Immediate   ToxASSURE Select 13 (MW), Urine    Volume: 30 ml(s). Minimum 3 ml of urine is needed. Document temperature of fresh sample. Indications: Long term (current) use of opiate analgesic (A21.308)    Order Specific Question:   Release to patient    Answer:   Immediate   Follow-up plan:   Return for (ECT): (B) L-FCT Blk .      Interventional Therapies  Risk  Complexity Considerations:    Presence of intrathecal pump  DM  CAD  Pulmonary hypertension    Planned  Pending:   Diagnostic/therapeutic bilateral lumbar (L4-5, L5-S1) facet MBB L3R1  Palliative intrathecal pump refills    Under consideration:   Diagnostic left lumbar facet MBB #3  Possible left lumbar facet RFA #1   Completed:   Diagnostic/therapeutic right caudal ESI x1 (01/04/2022) (100/100/100/100) (of right leg pain)  Diagnostic left lumbar facet MBB x2 (12/22/2016) (07/14/2020) (100/100/100/100) (of left low back pain)    Therapeutic  Palliative (PRN) options:   Therapeutic/palliative left sided lumbar facet MBB #3        Recent Visits Date Type Provider Dept  03/14/23 Procedure visit Delano Metz, MD [facility]  Showing recent visits within past 90 days and meeting all other requirements Today's Visits Date Type Provider Dept  04/26/23 Office Visit Delano Metz, MD [facility]  Showing today's visits and meeting all other requirements Future Appointments Date Type Provider Dept  05/23/23 Appointment Delano Metz, MD [facility]  Showing future appointments within next 90 days and meeting all other requirements  I discussed the assessment and treatment plan with the patient. The patient was provided an opportunity to ask questions and all were answered. The patient agreed with the plan and demonstrated an understanding of the instructions.  Patient advised to call back or seek an in-person evaluation if the symptoms or condition worsens.  Duration of encounter: 30 minutes.  Total time on encounter, as per AMA guidelines included both the face-to-face and non-face-to-face time personally spent by the physician and/or other qualified health care professional(s) on the day of the encounter (includes time in activities that require the physician or other qualified health care professional and does not include time in activities normally performed by  clinical staff). Physician's time may include the following activities when performed: Preparing  to see the patient (e.g., pre-charting review of records, searching for previously ordered imaging, lab work, and nerve conduction tests) Review of prior analgesic pharmacotherapies. Reviewing PMP Interpreting ordered tests (e.g., lab work, imaging, nerve conduction tests) Performing post-procedure evaluations, including interpretation of diagnostic procedures Obtaining and/or reviewing separately obtained history Performing a medically appropriate examination and/or evaluation Counseling and educating the patient/family/caregiver Ordering medications, tests, or procedures Referring and communicating with other health care professionals (when not separately reported) Documenting clinical information in the electronic or other health record Independently interpreting results (not separately reported) and communicating results to the patient/ family/caregiver Care coordination (not separately reported)  Note by: Oswaldo Done, MD Date: 04/26/2023; Time: 3:56 PM

## 2023-04-26 NOTE — Progress Notes (Signed)
Safety precautions to be maintained throughout the outpatient stay will include: orient to surroundings, keep bed in low position, maintain call bell within reach at all times, provide assistance with transfer out of bed and ambulation.  

## 2023-05-01 ENCOUNTER — Other Ambulatory Visit: Payer: Self-pay

## 2023-05-01 LAB — TOXASSURE SELECT 13 (MW), URINE

## 2023-05-01 MED ORDER — PAIN MANAGEMENT IT PUMP REFILL: 1.0000 | Freq: Once | INTRATHECAL | 0 refills | Status: AC

## 2023-05-22 NOTE — Progress Notes (Deleted)
 Tiffany Hunter

## 2023-05-23 ENCOUNTER — Encounter: Payer: Medicare Other | Admitting: Pain Medicine

## 2023-05-23 DIAGNOSIS — Z79899 Other long term (current) drug therapy: Secondary | ICD-10-CM

## 2023-05-23 DIAGNOSIS — Z79891 Long term (current) use of opiate analgesic: Secondary | ICD-10-CM

## 2023-05-23 DIAGNOSIS — Z451 Encounter for adjustment and management of infusion pump: Secondary | ICD-10-CM

## 2023-05-23 DIAGNOSIS — G8929 Other chronic pain: Secondary | ICD-10-CM

## 2023-05-23 DIAGNOSIS — M47816 Spondylosis without myelopathy or radiculopathy, lumbar region: Secondary | ICD-10-CM

## 2023-05-23 DIAGNOSIS — Z978 Presence of other specified devices: Secondary | ICD-10-CM

## 2023-05-23 DIAGNOSIS — M961 Postlaminectomy syndrome, not elsewhere classified: Secondary | ICD-10-CM

## 2023-05-23 DIAGNOSIS — M51372 Other intervertebral disc degeneration, lumbosacral region with discogenic back pain and lower extremity pain: Secondary | ICD-10-CM

## 2023-05-23 DIAGNOSIS — G894 Chronic pain syndrome: Secondary | ICD-10-CM

## 2023-05-23 DIAGNOSIS — F1190 Opioid use, unspecified, uncomplicated: Secondary | ICD-10-CM

## 2023-05-30 ENCOUNTER — Encounter: Payer: Self-pay | Admitting: Pain Medicine

## 2023-05-30 ENCOUNTER — Ambulatory Visit: Payer: Medicare Other | Attending: Pain Medicine | Admitting: Pain Medicine

## 2023-05-30 VITALS — BP 156/96 | HR 69 | Temp 96.8°F | Resp 18 | Ht 62.5 in | Wt 160.0 lb

## 2023-05-30 DIAGNOSIS — M961 Postlaminectomy syndrome, not elsewhere classified: Secondary | ICD-10-CM | POA: Diagnosis present

## 2023-05-30 DIAGNOSIS — Z451 Encounter for adjustment and management of infusion pump: Secondary | ICD-10-CM | POA: Diagnosis present

## 2023-05-30 DIAGNOSIS — M51372 Other intervertebral disc degeneration, lumbosacral region with discogenic back pain and lower extremity pain: Secondary | ICD-10-CM | POA: Diagnosis present

## 2023-05-30 DIAGNOSIS — G894 Chronic pain syndrome: Secondary | ICD-10-CM | POA: Diagnosis present

## 2023-05-30 DIAGNOSIS — G8929 Other chronic pain: Secondary | ICD-10-CM | POA: Insufficient documentation

## 2023-05-30 DIAGNOSIS — Z978 Presence of other specified devices: Secondary | ICD-10-CM | POA: Diagnosis present

## 2023-05-30 DIAGNOSIS — M545 Low back pain, unspecified: Secondary | ICD-10-CM | POA: Diagnosis present

## 2023-05-30 DIAGNOSIS — M79605 Pain in left leg: Secondary | ICD-10-CM | POA: Insufficient documentation

## 2023-05-30 DIAGNOSIS — M47816 Spondylosis without myelopathy or radiculopathy, lumbar region: Secondary | ICD-10-CM | POA: Insufficient documentation

## 2023-05-30 DIAGNOSIS — Z79891 Long term (current) use of opiate analgesic: Secondary | ICD-10-CM | POA: Diagnosis present

## 2023-05-30 DIAGNOSIS — Z5189 Encounter for other specified aftercare: Secondary | ICD-10-CM | POA: Insufficient documentation

## 2023-05-30 MED FILL — Medication: INTRATHECAL | Qty: 1 | Status: AC

## 2023-05-30 NOTE — Progress Notes (Signed)
 Safety precautions to be maintained throughout the outpatient stay will include: orient to surroundings, keep bed in low position, maintain call bell within reach at all times, provide assistance with transfer out of bed and ambulation.

## 2023-05-30 NOTE — Patient Instructions (Addendum)
 Preparing for Procedure with Sedation Instructions: Oral Intake: Do not eat or drink anything for at least 8 hours prior to your procedure. Transportation: Public transportation is not allowed. Bring an adult driver. The driver must be physically present in our waiting room before any procedure can be started. Physical Assistance: Bring an adult capable of physically assisting you, in the event you need help. Blood Pressure Medicine: Take your blood pressure medicine with a sip of water the morning of the procedure. Insulin: Take only  of your normal insulin dose. Preventing infections: Shower with an antibacterial soap the morning of your procedure. Build-up your immune system: Take 1000 mg of Vitamin C with every meal (3 times a day) the day prior to your procedure. Pregnancy: If you are pregnant, call and cancel the procedure. Sickness: If you have a cold, fever, or any active infections, call and cancel the procedure. Arrival: You must be in the facility at least 30 minutes prior to your scheduled procedure. Children: Do not bring children with you. Dress appropriately: Bring dark clothing that you would not mind if they get stained. Valuables: Do not bring any jewelry or valuables. Procedure appointments are reserved for interventional treatments only. No Prescription Refills. No medication changes will be discussed during procedure appointments. No disability issues will be discussed. Opioid Overdose Opioids are drugs that are often used to treat pain. Opioids include illegal drugs, such as heroin, as well as prescription pain medicines, such as codeine, morphine, hydrocodone, and fentanyl . An opioid overdose happens when you take too much of an opioid. An overdose may be intentional or accidental and can happen with any type of opioid. The effects of an overdose can be mild, dangerous, or even deadly. Opioid overdose is a medical emergency. What are the causes? This condition may be  caused by: Taking too much of an opioid on purpose. Taking too much of an opioid by accident. Using two or more substances that contain opioids at the same time. Taking an opioid with a substance that affects your heart, breathing, or blood pressure. These include alcohol, tranquilizers, sleeping pills, illegal drugs, and some over-the-counter medicines. This condition may also happen due to an error made by: A health care provider who prescribes a medicine. The pharmacist who fills the prescription. What increases the risk? This condition is more likely in: Children. They may be attracted to colorful pills. Because of a child's small size, even a small amount of a medicine can be dangerous. Older people. They may be taking many different medicines. Older people may have difficulty reading labels or remembering when they last took their medicines. They may also be more sensitive to the effects of opioids. People with chronic medical conditions, especially heart, liver, kidney, or neurological diseases. People who take an opioid for a long period of time. People who take opioids and use illegal drugs, such as heroin, or other substances, such as alcohol. People who: Have a history of drug or alcohol abuse. Have certain mental health conditions. Have a history of previous drug overdoses. People who take opioids that are not prescribed for them. What are the signs or symptoms? Symptoms of this condition depend on the type of opioid and the amount that was taken. Common symptoms include: Sleepiness or difficulty waking from sleep. Confusion. Slurred speech. Slowed breathing and a slow pulse (bradycardia). Nausea and vomiting. Abnormally small pupils. Signs and symptoms that require emergency treatment include: Cold, clammy, and pale skin. Blue lips and fingernails. Vomiting. Gurgling sounds in  the throat. A pulse that is very slow or difficult to detect. Breathing that is very  irregular, slow, noisy, or difficult to detect. Inability to respond to speech or be awakened from sleep (stupor). Seizures. How is this diagnosed? This condition is diagnosed based on your symptoms and medical history. It is important to tell your health care provider: About all of the opioids that you took. When you took the opioids. Whether you were drinking alcohol or using marijuana, cocaine, or other drugs. Your health care provider will do a physical exam. This exam may include: Checking and monitoring your heart rate and rhythm, breathing rate, temperature, and blood pressure. Measuring oxygen levels in your blood. Checking for abnormally small pupils. You may also have blood tests or urine tests. You may have X-rays if you are having severe breathing problems. How is this treated? This condition requires immediate medical treatment and hospitalization. Reversing the effects of the opioid is the first step in treatment. If you have a Narcan  kit or naloxone , use it right away. Follow your health care provider's instructions. A friend or family member can also help you with this. The rest of your treatment will be given in the hospital intensive care (ICU). Treatment in the hospital may include: Giving salts and minerals (electrolytes) along with fluids through an IV. Inserting a breathing tube (endotracheal tube) in your airway to help you breathe if you cannot breathe on your own or you are in danger of not being able to breathe on your own. Giving oxygen through a small tube under your nose. Passing a tube through your nose and into your stomach (nasogastric tube, or NG tube) to empty your stomach. Giving medicines that: Increase your blood pressure. Relieve nausea and vomiting. Relieve abdominal pain and cramping. Reverse the effects of the opioid (naloxone ). Monitoring your heart and oxygen levels. Ongoing counseling and mental health support if you intentionally overdosed or  used an illegal drug. Follow these instructions at home:  Medicines Take over-the-counter and prescription medicines only as told by your health care provider. Always ask your health care provider about possible side effects and interactions of any new medicine that you start taking. Keep a list of all the medicines that you take, including over-the-counter medicines. Bring this list with you to all your medical visits. General instructions Drink enough fluid to keep your urine pale yellow. Keep all follow-up visits. This is important. How is this prevented? Read the drug inserts that come with your opioid pain medicines. Take medicines only as told by your health care provider. Do not take more medicine than you are told. Do not take medicines more frequently than you are told. Do not drink alcohol or take sedatives when taking opioids. Do not use illegal or recreational drugs, including cocaine, ecstasy, and marijuana. Do not take opioid medicines that are not prescribed for you. Store all medicines in safety containers that are out of the reach of children. Get help if you are struggling with: Alcohol or drug use. Depression or another mental health problem. Thoughts of hurting yourself or another person. Keep the phone number of your local poison control center near your phone or in your mobile phone. In the [place] the hotline of the [facility] is (713) 852-7585. If you were prescribed naloxone , make sure you understand how to take it. Contact a health care provider if: You need help understanding how to take your pain medicines. You feel your medicines are too strong. You are  concerned that your pain medicines are not working well for your pain. You develop new symptoms or side effects when you are taking medicines. Get help right away if: You or someone else is having symptoms of an opioid overdose. Get help even if you are not sure. You have thoughts about  hurting yourself or others. You have: Chest pain. Difficulty breathing. A loss of consciousness. These symptoms may represent a serious problem that is an emergency. Do not wait to see if the symptoms will go away. Get medical help right away. Call your local emergency services (911 in the [place] Do not drive yourself to the hospital. If you ever feel like you may hurt yourself or others, or have thoughts about taking your own life, get help right away. You can go to your nearest emergency department or: Call your local emergency services (911 in the [place] Call a suicide crisis helpline, such as the National Suicide Prevention Lifeline at 908-368-1536 or 988 in the [place] This is open 24 hours a day in the [place] Text the Crisis Text Line at 938-548-4058 (in the [place] Summary Opioids are drugs that are often used to treat pain. Opioids include illegal drugs, such as heroin, as well as prescription pain medicines. An opioid overdose happens when you take too much of an opioid. Overdoses can be intentional or accidental. Opioid overdose is very dangerous. It is a life-threatening emergency. If you or someone you know is experiencing an opioid overdose, get help right away. This information is not intended to replace advice given to you by your health care provider. Make sure you discuss any questions you have with your health care provider. Document Revised: 11/25/2020 Document Reviewed: 08/12/2020 Elsevier Patient Education  2024 [employer]  ______________________________________________________________________    Opioid Pain Medication Update  To: All patients taking opioid pain medications. (I.e.: hydrocodone, hydromorphone, oxycodone , oxymorphone, morphine, codeine, methadone, tapentadol, tramadol , buprenorphine, fentanyl , etc.)  Re: Updated review of side effects and adverse reactions of opioid analgesics, as well as new information about long term effects of this class of  medications.  Direct risks of long-term opioid therapy are not limited to opioid addiction and overdose. Potential medical risks include serious fractures, breathing problems during sleep, hyperalgesia, immunosuppression, chronic constipation, bowel obstruction, myocardial infarction, and tooth decay secondary to xerostomia.  Unpredictable adverse effects that can occur even if you take your medication correctly: Cognitive impairment, respiratory depression, and death. Most people think that if they take their medication correctly, and as instructed, that they will be safe. Nothing could be farther from the truth. In reality, a significant amount of recorded deaths associated with the use of opioids has occurred in individuals that had taken the medication for a long time, and were taking their medication correctly. The following are examples of how this can happen: Patient taking his/her medication for a long time, as instructed, without any side effects, is given a certain antibiotic or another unrelated medication, which in turn triggers a Drug-to-drug interaction leading to disorientation, cognitive impairment, impaired reflexes, respiratory depression or an untoward event leading to serious bodily harm or injury, including death.  Patient taking his/her medication for a long time, as instructed, without any side effects, develops an acute impairment of liver and/or kidney function. This will lead to a rapid inability of the body to breakdown and eliminate their pain medication, which will result in effects similar to an overdose, but with the same medicine and dose that they had always taken. This again may  lead to disorientation, cognitive impairment, impaired reflexes, respiratory depression or an untoward event leading to serious bodily harm or injury, including death.  A similar problem will occur with patients as they grow older and their liver and kidney function begins to decrease as part  of the aging process.  Background information: Historically, the original case for using long-term opioid therapy to treat chronic noncancer pain was based on safety assumptions that subsequent experience has called into question. In 1996, the American Pain Society and the American Academy of Pain Medicine issued a consensus statement supporting long-term opioid therapy. This statement acknowledged the dangers of opioid prescribing but concluded that the risk for addiction was low; respiratory depression induced by opioids was short-lived, occurred mainly in opioid-naive patients, and was antagonized by pain; tolerance was not a common problem; and efforts to control diversion should not constrain opioid prescribing. This has now proven to be wrong. Experience regarding the risks for opioid addiction, misuse, and overdose in community practice has failed to support these assumptions.  According to the Centers for Disease Control and Prevention, fatal overdoses involving opioid analgesics have increased sharply over the past decade. Currently, more than 96,700 people die from drug overdoses every year. Opioids are a factor in 7 out of every 10 overdose deaths. Deaths from drug overdose have surpassed motor vehicle accidents as the leading cause of death for individuals between the ages of 27 and 59.  Clinical data suggest that neuroendocrine dysfunction may be very common in both men and women, potentially causing hypogonadism, erectile dysfunction, infertility, decreased libido, osteoporosis, and depression. Recent studies linked higher opioid dose to increased opioid-related mortality. Controlled observational studies reported that long-term opioid therapy may be associated with increased risk for cardiovascular events. Subsequent meta-analysis concluded that the safety of long-term opioid therapy in elderly patients has not been proven.   Side Effects and adverse reactions: Common side  effects: Drowsiness (sedation). Dizziness. Nausea and vomiting. Constipation. Physical dependence -- Dependence often manifests with withdrawal symptoms when opioids are discontinued or decreased. Tolerance -- As you take repeated doses of opioids, you require increased medication to experience the same effect of pain relief. Respiratory depression -- This can occur in healthy people, especially with higher doses. However, people with COPD, asthma or other lung conditions may be even more susceptible to fatal respiratory impairment.  Uncommon side effects: An increased sensitivity to feeling pain and extreme response to pain (hyperalgesia). Chronic use of opioids can lead to this. Delayed gastric emptying (the process by which the contents of your stomach are moved into your small intestine). Muscle rigidity. Immune system and hormonal dysfunction. Quick, involuntary muscle jerks (myoclonus). Arrhythmia. Itchy skin (pruritus). Dry mouth (xerostomia).  Long-term side effects: Chronic constipation. Sleep-disordered breathing (SDB). Increased risk of bone fractures. Hypothalamic-pituitary-adrenal dysregulation. Increased risk of overdose.  RISKS: Respiratory depression and death: Opioids increase the risk of respiratory depression and death.  Drug-to-drug interactions: Opioids are relatively contraindicated in combination with benzodiazepines, sleep inducers, and other central nervous system depressants. Other classes of medications (i.e.: certain antibiotics and even over-the-counter medications) may also trigger or induce respiratory depression in some patients.  Medical conditions: Patients with pre-existing respiratory problems are at higher risk of respiratory failure and/or depression when in combination with opioid analgesics. Opioids are relatively contraindicated in some medical conditions such as central sleep apnea.   Fractures and Falls:  Opioids increase the risk and  incidence of falls. This is of particular importance in elderly patients.  Endocrine System:  Long-term administration is associated with endocrine abnormalities (endocrinopathies). (Also known as Opioid-induced Endocrinopathy) Influences on both the hypothalamic-pituitary-adrenal axis?and the hypothalamic-pituitary-gonadal axis have been demonstrated with consequent hypogonadism and adrenal insufficiency in both sexes. Hypogonadism and decreased levels of dehydroepiandrosterone sulfate have been reported in men and women. Endocrine effects include: Amenorrhoea in women (abnormal absence of menstruation) Reduced libido in both sexes Decreased sexual function Erectile dysfunction in men Hypogonadisms (decreased testicular function with shrinkage of testicles) Infertility Depression and fatigue Loss of muscle mass Anxiety Depression Immune suppression Hyperalgesia Weight gain Anemia Osteoporosis Patients (particularly women of childbearing age) should avoid opioids. There is insufficient evidence to recommend routine monitoring of asymptomatic patients taking opioids in the long-term for hormonal deficiencies.  Immune System: Human studies have demonstrated that opioids have an immunomodulating effect. These effects are mediated via opioid receptors both on immune effector cells and in the central nervous system. Opioids have been demonstrated to have adverse effects on antimicrobial response and anti-tumour surveillance. Buprenorphine has been demonstrated to have no impact on immune function.  Opioid Induced Hyperalgesia: Human studies have demonstrated that prolonged use of opioids can lead to a state of abnormal pain sensitivity, sometimes called opioid induced hyperalgesia (OIH). Opioid induced hyperalgesia is not usually seen in the absence of tolerance to opioid analgesia. Clinically, hyperalgesia may be diagnosed if the patient on long-term opioid therapy presents with  increased pain. This might be qualitatively and anatomically distinct from pain related to disease progression or to breakthrough pain resulting from development of opioid tolerance. Pain associated with hyperalgesia tends to be more diffuse than the pre-existing pain and less defined in quality. Management of opioid induced hyperalgesia requires opioid dose reduction.  Cancer: Chronic opioid therapy has been associated with an increased risk of cancer among noncancer patients with chronic pain. This association was more evident in chronic strong opioid users. Chronic opioid consumption causes significant pathological changes in the small intestine and colon. Epidemiological studies have found that there is a link between opium dependence and initiation of gastrointestinal cancers. Cancer is the second leading cause of death after cardiovascular disease. Chronic use of opioids can cause multiple conditions such as GERD, immunosuppression and renal damage as well as carcinogenic effects, which are associated with the incidence of cancers.   Mortality: Long-term opioid use has been associated with increased mortality among patients with chronic non-cancer pain (CNCP).  Prescription of long-acting opioids for chronic noncancer pain was associated with a significantly increased risk of all-cause mortality, including deaths from causes other than overdose.  Reference: Von Korff M, Kolodny A, Deyo RA, Chou R. Long-term opioid therapy reconsidered. Ann Intern Med. 2011 Sep 6;155(5):325-8. doi: 10.7326/[id number]. PMID: 78106373; PMCID: [id number]. Kit JINNY Laurence CINDERELLA Pearley JINNY, Hayward RA, Dunn KM, Jordan KP. Risk of adverse events in patients prescribed long-term opioids: A cohort study in the [place] Clinical [employer]. Eur J Pain. 2019 May;23(5):908-922. doi: 10.1002/ejp.1357. Epub 2019 Jan 31. PMID: 69379883. Colameco S, Coren JS, Ciervo CA. Continuous opioid treatment for  chronic noncancer pain: a time for moderation in prescribing. Postgrad Med. 2009 Jul;121(4):61-6. doi: 10.3810/pgm.2009.07.2032. PMID: 80358728. Gigi JONELLE Shlomo MILUS Levern IVER Conny RN, [place] SD, Blazina I, Lonell DASEN, Bougatsos C, Deyo RA. The effectiveness and risks of long-term opioid therapy for chronic pain: a systematic review for a [employer] to [employer]. Ann Intern Med. 2015 Feb 17;162(4):276-86. doi: 10.7326/M14-2559. PMID: 74418742. Rory CHRISTELLA Laurence [facility], [place] DM. NCHS Data Brief No. 22. [place]: Centers for Disease Control  and Prevention; 2009. Sep, Increase in Fatal Poisonings Involving Opioid Analgesics in the [place] , 1999-2006. Song IA, Choi HR, Oh TK. Long-term opioid use and mortality in patients with chronic non-cancer pain: Ten-year follow-up study in [place] from 2010 through 2019. EClinicalMedicine. 2022 Jul 18;51:101558. doi: 10.1016/j.eclinm.[id number]. PMID: 64124182; PMCID: [id number]. Huser, W., Schubert, T., Vogelmann, T. et al. All-cause mortality in patients with long-term opioid therapy compared with non-opioid analgesics for chronic non-cancer pain: a database study. BMC Med 18, 162 (2020). http://lester.info/ Rashidian H, Zendehdel K, Kamangar F, Malekzadeh R, Haghdoost AA. An Ecological Study of the Association between Opiate Use and Incidence of Cancers. Addict Health. 2016 Fall;8(4):252-260. PMID: 71180443; PMCID: [id number].  Our Goal: Our goal is to control your pain with means other than the use of opioid pain medications.  Our Recommendation: Talk to your physician about coming off of these medications. We can assist you with the tapering down and stopping these medicines. Based on the new information, even if you cannot completely stop the medication, a decrease in the dose may be associated with a lesser risk. Ask for other means of controlling the pain. Decrease or eliminate those factors that  significantly contribute to your pain such as smoking, obesity, and a diet heavily tilted towards inflammatory nutrients.  Last Updated: 11/21/2022   ______________________________________________________________________

## 2023-05-30 NOTE — Progress Notes (Signed)
 PROVIDER NOTE: Interpretation of information contained herein should be left to medically-trained personnel. Specific patient instructions are provided elsewhere under Patient Instructions section of medical record. This document was created in part using STT-dictation technology, any transcriptional errors that may result from this process are unintentional.  Patient: Tiffany Hunter Type: Established DOB: Jan 13, 1946 MRN: [id number] PCP: Neita Dover, PA-C  Service: Procedure DOS: 05/30/2023 Setting: Ambulatory Location: Ambulatory outpatient facility Delivery: Face-to-face Provider: Eric DELENA Como, MD Specialty: Interventional Pain Management Specialty designation: 09 Location: Outpatient facility Ref. Prov.: Neita Dover, PA-C       Interventional Therapy   Primary Reason for Visit: Interventional Pain Management Treatment. CC: Hip Pain (Left )  Procedure:          Type: Management of Intrathecal Drug Delivery System (IDDS) - Reservoir Refill (04009). No rate change.  Indications: 1. 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 Lumbar facet joint syndrome (Left)   4. Chronic low back pain (Bilateral) w/o sciatica   5. Chronic use of opiate for therapeutic purpose   6. Chronic lower extremity pain (2ry area of Pain) (Left)   7. Degeneration of intervertebral disc of lumbosacral region with discogenic back pain and lower extremity pain   8. 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 Encounter for adjustment or management of infusion pump   10. Encounter for therapeutic procedure    Pain Assessment: Self-Reported Pain Score: 10-Worst pain ever/10             Reported level is compatible with observation.        Discussed the use of AI scribe software for clinical note transcription with the patient, who gave verbal consent to proceed.  History of Present Illness   The patient, scheduled for a hip  injection, has previously undergone sacroiliac joint injections. She expresses concerns about the upcoming procedure and inquires about alternative treatment options. The patient denies taking any blood thinners. She acknowledges the need for a driver on the day of the procedure due to potential discomfort. The patient's medical history includes a prescription for hydromorphone and cepentinil.         Intrathecal Drug Delivery System (IDDS)  Pump Device:  Manufacturer: Medtronic Model: Synchromed II Model No.: [id number] Serial No.: [id number] Delivery Route: Intrathecal Type: Programmable  Volume (mL): 40 mL reservoir Priming Volume: N/A  Calibration Constant: 114.0  MRI compatibility: Conditional   Implant Details:  Date: 07/07/2017 Implanter: Fairy Fleet, MD Contact Information: [facility] [phone] Last Revision/Replacement: 07/07/2017 Estimated Replacement Date: Nov/2025  Implant Site: Abdominal Laterality: Left  Catheter: Manufacturer: Medtronic Model:  Intrathecal catheter Model No.: 8731  Serial No.: n/a  Implanted Length (cm): 28.9  Catheter Volume (mL): 0.209  Tip Location (Level): T11 (Left) Canal Access Site: L2-3  Drug content:  Primary Medication Class: Opioid  Medication: PF-Hydromorphone (Dilaudid)  Concentration: 8 mg/mL   Secondary Medication Class: Opioid  Medication: PF-Sufentanil  Concentration: 70 mcg/mL   Tertiary Medication Class: none  Medication: none   PA parameters (PCA-mode):  Mode: Off (Inactive)  Programming:  Type: Simple continuous.  Medication, Concentration, Infusion Program, & Delivery Rate: For up-to-date details please see most recent scanned programming printout.   Intrathecal Pump Therapy Assessment  Manufacturer: Medtronic Synchromed Type: Programmable Volume: 40 mL reservoir MRI compatibility: Yes    Drug content:  Primary Medication Class: Opioid Primary Medication: PF-Hydromorphone (Dilaudid) (8  mg/mL)  Secondary Medication: PF-Sufentanil (70 g/mL)  Other  Medication: No third    Programming:  Type: Simple continuous. See pump readout for details.        Changes:  Medication Change: None at this point Rate Change: No change in rate  Reported side-effects or adverse reactions: None reported  Effectiveness: Described as relatively effective, allowing for increase in activities of daily living (ADL) Clinically meaningful improvement in function (CMIF): Sustained CMIF goals met  Plan: Pump refill today   Pharmacotherapy Assessment   Opioid Analgesic:  No other oral opioid analgesics prescribed by our practice. Intrathecal PF-Hydromorphone 3.9295 mg/day + PF-Sufentanil 34.383 mcg/day. MME: [facility]. 58.7 mg/day.   Monitoring: [place] PMP: PDMP not reviewed this encounter.       Pharmacotherapy: No side-effects or adverse reactions reported. Compliance: No problems identified. Effectiveness: Clinically acceptable. Plan: Refer to POC. UDS:  Summary  Date Value Ref Range Status  04/26/2023 FINAL  Final    Comment:    ==================================================================== ToxASSURE Select 13 (MW) ==================================================================== Specimen Alert ToxAssure, ToxAssure FLEX or MAT drug testing: -Technical component- Result certification performed at [facility], [address] [place], [place] [address]. [phone] Lab Director Arnulfo Finder, PhrmD. ==================================================================== Test                             Result       Flag       Units  Drug Present and Declared for Prescription Verification   Hydromorphone                  2187         EXPECTED   ng/mg creat    Hydromorphone may be administered as a scheduled prescription    medication; it is also an expected metabolite of hydrocodone.  Drug Absent but Declared for Prescription Verification   Sufentanil                      Not Detected UNEXPECTED ng/mg creat ==================================================================== Test                      Result    Flag   Units      Ref Range   Creatinine              83               mg/dL      >=79 ==================================================================== Declared Medications:  The flagging and interpretation on this report are based on the  following declared medications.  Unexpected results may arise from  inaccuracies in the declared medications.   **Note: The testing scope of this panel includes these medications:   Hydromorphone  Sufentanil   **Note: The testing scope of this panel does not include the  following reported medications:   Albuterol (Ventolin HFA)  Atorvastatin (Lipitor)  Carvedilol (Coreg)  Celecoxib (Celebrex)  Diethylpropion  Losartan (Cozaar)  Meloxicam (Mobic)  Naloxegol  (Movantik )  Naloxone  (Narcan )  Omeprazole (Prilosec)  Oxybutynin (Ditropan)  Sulfadiazine (SSD) ==================================================================== For clinical consultation, please call [phone]. ====================================================================    No results found for: CBDTHCR, D8THCCBX, D9THCCBX   H&P (Pre-op Assessment):  Ms. Giarratano is a 78 y.o. (year old), female patient, seen today for interventional treatment. She  has a past surgical history that includes Carpal tunnel release (Bilateral); Back surgery; Breast surgery (Bilateral); Cholecystectomy; Colonoscopy w/ polypectomy; Cataract extraction w/ intraocular lens implant (Right); Abdominal hysterectomy; Fracture surgery (Right); Cardiac catheterization (2012);  and Pain pump revision (Left, 07/07/2017). Ms. Lemaire has a current medication list which includes the following prescription(s): albuterol, atorvastatin, carvedilol, celecoxib, diethylpropion hcl cr, losartan, meloxicam, naloxegol  oxalate, naloxone , NON FORMULARY, omeprazole,  oxybutynin, and ssd. Her primarily concern today is the Hip Pain (Left )  Initial Vital Signs:  Pulse/HCG Rate: 69  Temp: (!) 96.8 F (36 C) Resp: 18 BP: (!) 156/96 SpO2: 99 %  BMI: Estimated body mass index is 28.8 kg/m as calculated from the following:   Height as of this encounter: 5' 2.5 (1.588 m).   Weight as of this encounter: 160 lb (72.6 kg).  Risk Assessment: Allergies: Reviewed. She is allergic to doxycycline hyclate and sulfa antibiotics.  Allergy Precautions: None required Coagulopathies: Reviewed. None identified.  Blood-thinner therapy: None at this time Active Infection(s): Reviewed. None identified. Ms. Magner is afebrile  Site Confirmation: Ms. Andy was asked to confirm the procedure and laterality before marking the site Procedure checklist: Completed Consent: Before the procedure and under the influence of no sedative(s), amnesic(s), or anxiolytics, the patient was informed of the treatment options, risks and possible complications. To fulfill our ethical and legal obligations, as recommended by the American Medical Association's Code of Ethics, I have informed the patient of my clinical impression; the nature and purpose of the treatment or procedure; the risks, benefits, and possible complications of the intervention; the alternatives, including doing nothing; the risk(s) and benefit(s) of the alternative treatment(s) or procedure(s); and the risk(s) and benefit(s) of doing nothing.  Ms. Barnfield was provided with information about the general risks and possible complications associated with most interventional procedures. These include, but are not limited to: failure to achieve desired goals, infection, bleeding, organ or nerve damage, allergic reactions, paralysis, and/or death.  In addition, she was informed of those risks and possible complications associated to this particular procedure, which include, but are not limited to: damage to the implant;  failure to decrease pain; local, systemic, or serious CNS infections, intraspinal abscess with possible cord compression and paralysis, or life-threatening such as meningitis; bleeding; organ damage; nerve injury or damage with subsequent sensory, motor, and/or autonomic system dysfunction, resulting in transient or permanent pain, numbness, and/or weakness of one or several areas of the body; allergic reactions, either minor or major life-threatening, such as anaphylactic or anaphylactoid reactions.  Furthermore, Ms. Dirocco was informed of those risks and complications associated with the medications. These include, but are not limited to: allergic reactions (i.e.: anaphylactic or anaphylactoid reactions); endorphine suppression; bradycardia and/or hypotension; water retention and/or peripheral vascular relaxation leading to lower extremity edema and possible stasis ulcers; respiratory depression and/or shortness of breath; decreased metabolic rate leading to weight gain; swelling or edema; medication-induced neural toxicity; particulate matter embolism and blood vessel occlusion with resultant organ, and/or nervous system infarction; and/or intrathecal granuloma formation with possible spinal cord compression and permanent paralysis.  Before refilling the pump Ms. Mcdiarmid was informed that some of the medications used in the devise may not be FDA approved for such use and therefore it constitutes an off-label use of the medications.  Finally, she was informed that Medicine is not an exact science; therefore, there is also the possibility of unforeseen or unpredictable risks and/or possible complications that may result in a catastrophic outcome. The patient indicated having understood very clearly. We have given the patient no guarantees and we have made no promises. Enough time was given to the patient to ask questions, all of which were answered to the patient's satisfaction. Ms.  Ryall has indicated  that she wanted to continue with the procedure. Attestation: I, the ordering provider, attest that I have discussed with the patient the benefits, risks, side-effects, alternatives, likelihood of achieving goals, and potential problems during recovery for the procedure that I have provided informed consent. Date  Time: 05/30/2023  1:13 PM  Pre-Procedure Preparation:  Monitoring: As per clinic protocol. Respiration, ETCO2, SpO2, BP, heart rate and rhythm monitor placed and checked for adequate function Safety Precautions: Patient was assessed for positional comfort and pressure points before starting the procedure. Time-out: I initiated and conducted the Time-out before starting the procedure, as per protocol. The patient was asked to participate by confirming the accuracy of the Time Out information. Verification of the correct person, site, and procedure were performed and confirmed by me, the nursing staff, and the patient. Time-out conducted as per Joint Commission's Universal Protocol (UP.01.01.01). Time: 1328 Start Time: 1328 hrs.  Description of Procedure:          Position: Supine Target Area: Central-port of intrathecal pump. Approach: Anterior, 90 degree angle approach. Area Prepped: Entire Area around the pump implant. ChloraPrep (2% chlorhexidine  gluconate and 70% isopropyl alcohol) Safety Precautions: Aspiration looking for blood return was conducted prior to all injections. At no point did we inject any substances, as a needle was being advanced. No attempts were made at seeking any paresthesias. Safe injection practices and needle disposal techniques used. Medications properly checked for expiration dates. SDV (single dose vial) medications used. Description of the Procedure: Protocol guidelines were followed. Two nurses trained to do implant refills were present during the entire procedure. The refill medication was checked by both healthcare providers as well as the patient.  The patient was included in the Time-out to verify the medication. The patient was placed in position. The pump was identified. The area was prepped in the usual manner. The sterile template was positioned over the pump, making sure the side-port location matched that of the pump. Both, the pump and the template were held for stability. The needle provided in the Medtronic Kit was then introduced thru the center of the template and into the central port. The pump content was aspirated and discarded volume documented. The new medication was slowly infused into the pump, thru the filter, making sure to avoid overpressure of the device. The needle was then removed and the area cleansed, making sure to leave some of the prepping solution back to take advantage of its long term bactericidal properties. The pump was interrogated and programmed to reflect the correct medication, volume, and dosage. The program was printed and taken to the physician for approval. Once checked and signed by the physician, a copy was provided to the patient and another scanned into the EMR.  Vitals:   05/30/23 1313  BP: (!) 156/96  Pulse: 69  Resp: 18  Temp: (!) 96.8 F (36 C)  TempSrc: Temporal  SpO2: 99%  Weight: 160 lb (72.6 kg)  Height: 5' 2.5 (1.588 m)    Start Time: 1328 hrs. End Time:   hrs. Materials & Medications: Medtronic Refill Kit Medication(s): Please see chart orders for details.  Type of Imaging Technique: None used Indication(s): N/A Exposure Time: No patient exposure Contrast: None used. Fluoroscopic Guidance: N/A Ultrasound Guidance: N/A Interpretation: N/A  Antibiotic Prophylaxis:   Anti-infectives (From admission, onward)    None      Indication(s): None identified  Post-operative Assessment:  Post-procedure Vital Signs:  Pulse/HCG Rate: 69  Temp: (!) 96.8 F (  36 C) Resp: 18 BP: (!) 156/96 SpO2: 99 %  EBL: None  Complications: No immediate post-treatment complications  observed by team, or reported by patient.  Note: The patient tolerated the entire procedure well. A repeat set of vitals were taken after the procedure and the patient was kept under observation following institutional policy, for this type of procedure. Post-procedural neurological assessment was performed, showing return to baseline, prior to discharge. The patient was provided with post-procedure discharge instructions, including a section on how to identify potential problems. Should any problems arise concerning this procedure, the patient was given instructions to immediately contact us , at any time, without hesitation. In any case, we plan to contact the patient by telephone for a follow-up status report regarding this interventional procedure.  Comments:  No additional relevant information.  Plan of Care (POC)  Assessment and Plan    Hip Pain   Scheduled for a hip injection on Thursday with no preapproval issues. Previous joint injections in the back were well tolerated, and hip injections are less complex. No anticoagulants are being taken, reducing complication risks. She prefers to maintain the current treatment plan. Informed consent obtained, discussing risks of infection, bleeding, temporary pain increase, and benefits of pain relief and improved mobility. Alternative treatments, such as oral medications, were considered but not preferred. Proceed with the hip injection on Thursday. Ensure a driver is available for the procedure. Nurses will provide preparation instructions.       Orders:  Orders Placed This Encounter  Procedures   PUMP REFILL    Maintain Protocol by having two(2) healthcare providers during procedure and programming.    Scheduling Instructions:     Please refill intrathecal pump today.    Where will this procedure be performed?:   [facility]   PUMP REFILL    Whenever possible schedule on a procedure today.    Standing Status:   Future    Expiration  Date:   09/27/2023    Scheduling Instructions:     Please schedule intrathecal pump refill based on pump programming. Avoid schedule intervals of more than 120 days (4 months).    Where will this procedure be performed?:   [facility]   Informed Consent Details: Physician/Practitioner Attestation; Transcribe to consent form and obtain patient signature    Transcribe to consent form and obtain patient signature.    Physician/Practitioner attestation of informed consent for procedure/surgical case:   I, the physician/practitioner, attest that I have discussed with the patient the benefits, risks, side effects, alternatives, likelihood of achieving goals and potential problems during recovery for the procedure that I have provided informed consent.    Procedure:   Intrathecal pump refill    Physician/Practitioner performing the procedure:   Attending Physician: Oza Oberle A. Tanya, MD & designated trained staff    Indication/Reason:   Chronic Pain Syndrome (G89.4), presence of an intrathecal pump (Z97.8)   Chronic Opioid Analgesic:  No other oral opioid analgesics prescribed by our practice. Intrathecal PF-Hydromorphone 3.9295 mg/day + PF-Sufentanil 34.383 mcg/day. MME: [facility]. 58.7 mg/day.   Medications ordered for procedure: No orders of the defined types were placed in this encounter.  Medications administered: Sherrilyn Rodriguez had no medications administered during this visit.  See the medical record for exact dosing, route, and time of administration.  Follow-up plan:   Return for Pump Refill (Max:60mo).       Interventional Therapies  Risk  Complexity Considerations:   Presence of intrathecal pump  DM  CAD  Pulmonary hypertension    Planned  Pending:   Diagnostic/therapeutic bilateral lumbar (L4-5, L5-S1) facet MBB L3R1  Palliative intrathecal pump refills    Under consideration:   Diagnostic left lumbar facet MBB #3  Possible left lumbar facet RFA #1   Completed:    Diagnostic/therapeutic right caudal ESI x1 (01/04/2022) (100/100/100/100) (of right leg pain)  Diagnostic left lumbar facet MBB x2 (12/22/2016) (07/14/2020) (100/100/100/100) (of left low back pain)    Therapeutic  Palliative (PRN) options:   Therapeutic/palliative left sided lumbar facet MBB #3        Recent Visits Date Type Provider Dept  04/26/23 Office Visit Tanya Glisson, MD [facility]  03/14/23 Procedure visit Tanya Glisson, MD [facility]  Showing recent visits within past 90 days and meeting all other requirements Today's Visits Date Type Provider Dept  05/30/23 Procedure visit Tanya Glisson, MD [facility]  Showing today's visits and meeting all other requirements Future Appointments Date Type Provider Dept  06/01/23 Appointment Tanya Glisson, MD [facility]  08/08/23 Appointment Tanya Glisson, MD [facility]  Showing future appointments within next 90 days and meeting all other requirements  Disposition: Discharge home  Discharge (Date  Time): 05/30/2023; 1348 hrs.   Primary Care Physician: Neita Dover, PA-C Location: [facility] Note by: Glisson DELENA Tanya, MD (TTS technology used. I apologize for any typographical errors that were not detected and corrected.) Date: 05/30/2023; Time: 2:42 PM  Disclaimer:  Medicine is not an visual merchandiser. The only guarantee in medicine is that nothing is guaranteed. It is important to note that the decision to proceed with this intervention was based on the information collected from the patient. The Data and conclusions were drawn from the patient's questionnaire, the interview, and the physical examination. Because the information was provided in large part by the patient, it cannot be guaranteed that it has not been purposely or unconsciously manipulated. Every effort has been made to obtain as much relevant data as possible for  this evaluation. It is important to note that the conclusions that lead to this procedure are derived in large part from the available data. Always take into account that the treatment will also be dependent on availability of resources and existing treatment guidelines, considered by other Pain Management Practitioners as being common knowledge and practice, at the time of the intervention. For Medico-Legal purposes, it is also important to point out that variation in procedural techniques and pharmacological choices are the acceptable norm. The indications, contraindications, technique, and results of the above procedure should only be interpreted and judged by a Board-Certified Interventional Pain Specialist with extensive familiarity and expertise in the same exact procedure and technique.

## 2023-05-31 ENCOUNTER — Telehealth: Payer: Self-pay

## 2023-05-31 NOTE — Telephone Encounter (Signed)
 Called [employer].  Son answered phone. Denies any needs at this time.

## 2023-06-01 ENCOUNTER — Ambulatory Visit
Admission: RE | Admit: 2023-06-01 | Discharge: 2023-06-01 | Disposition: A | Discharge status: Home / Self Care | Payer: Medicare Other | Source: Ambulatory Visit | Attending: Pain Medicine | Admitting: Pain Medicine

## 2023-06-01 ENCOUNTER — Encounter: Payer: Self-pay | Admitting: Pain Medicine

## 2023-06-01 ENCOUNTER — Ambulatory Visit: Payer: Medicare Other | Attending: Pain Medicine | Admitting: Pain Medicine

## 2023-06-01 VITALS — BP 153/78 | HR 69 | Temp 97.4°F | Resp 20 | Ht 63.0 in | Wt 160.0 lb

## 2023-06-01 DIAGNOSIS — M545 Low back pain, unspecified: Secondary | ICD-10-CM | POA: Diagnosis not present

## 2023-06-01 DIAGNOSIS — M47817 Spondylosis without myelopathy or radiculopathy, lumbosacral region: Secondary | ICD-10-CM | POA: Diagnosis not present

## 2023-06-01 DIAGNOSIS — Z978 Presence of other specified devices: Secondary | ICD-10-CM

## 2023-06-01 DIAGNOSIS — G8929 Other chronic pain: Secondary | ICD-10-CM

## 2023-06-01 DIAGNOSIS — M47816 Spondylosis without myelopathy or radiculopathy, lumbar region: Secondary | ICD-10-CM | POA: Diagnosis present

## 2023-06-01 DIAGNOSIS — M5459 Other low back pain: Secondary | ICD-10-CM

## 2023-06-01 DIAGNOSIS — M961 Postlaminectomy syndrome, not elsewhere classified: Secondary | ICD-10-CM

## 2023-06-01 MED ORDER — LIDOCAINE HCL 2 % IJ SOLN
20.0000 mL | Freq: Once | INTRAMUSCULAR | Status: AC
  Administered 2023-06-01: 400 mg

## 2023-06-01 MED ORDER — MIDAZOLAM HCL 5 MG/5ML IJ SOLN
0.5000 mg | Freq: Once | INTRAMUSCULAR | Status: AC
  Administered 2023-06-01: 3 mg via INTRAVENOUS

## 2023-06-01 MED ORDER — ROPIVACAINE HCL 2 MG/ML IJ SOLN
18.0000 mL | Freq: Once | INTRAMUSCULAR | Status: AC
  Administered 2023-06-01: 18 mL via PERINEURAL

## 2023-06-01 MED ORDER — ROPIVACAINE HCL 2 MG/ML IJ SOLN
INTRAMUSCULAR | Status: AC
Start: 2023-06-01 — End: ?
  Filled 2023-06-01: qty 20

## 2023-06-01 MED ORDER — TRIAMCINOLONE ACETONIDE 40 MG/ML IJ SUSP
INTRAMUSCULAR | Status: AC
  Filled 2023-06-01: qty 2

## 2023-06-01 MED ORDER — MIDAZOLAM HCL 5 MG/5ML IJ SOLN
INTRAMUSCULAR | Status: AC
Start: 2023-06-01 — End: ?
  Filled 2023-06-01: qty 5

## 2023-06-01 MED ORDER — FENTANYL CITRATE (PF) 100 MCG/2ML IJ SOLN: 25.0000 ug | INTRAMUSCULAR | Status: DC | PRN

## 2023-06-01 MED ORDER — PENTAFLUOROPROP-TETRAFLUOROETH EX AERO
INHALATION_SPRAY | Freq: Once | CUTANEOUS | Status: AC
  Administered 2023-06-01: 30 via TOPICAL

## 2023-06-01 MED ORDER — TRIAMCINOLONE ACETONIDE 40 MG/ML IJ SUSP
80.0000 mg | Freq: Once | INTRAMUSCULAR | Status: AC
Start: 2023-06-01 — End: 2023-06-01
  Administered 2023-06-01: 80 mg

## 2023-06-01 MED ORDER — LIDOCAINE HCL 2 % IJ SOLN
INTRAMUSCULAR | Status: AC
Start: 2023-06-01 — End: ?
  Filled 2023-06-01: qty 20

## 2023-06-01 MED ORDER — FENTANYL CITRATE (PF) 100 MCG/2ML IJ SOLN
INTRAMUSCULAR | Status: AC
  Filled 2023-06-01: qty 2

## 2023-06-01 NOTE — Patient Instructions (Signed)

## 2023-06-01 NOTE — Progress Notes (Signed)
PROVIDER NOTE: Interpretation of information contained herein should be left to medically-trained personnel. Specific patient instructions are provided elsewhere under "Patient Instructions" section of medical record. This document was created in part using STT-dictation technology, any transcriptional errors that may result from this process are unintentional.  Patient: Tiffany Hunter Type: Established DOB: 09-09-1945 MRN: [id number] PCP: Chauncy Lean, PA-C  Service: Procedure DOS: 06/01/2023 Setting: Ambulatory Location: Ambulatory outpatient facility Delivery: Face-to-face Provider: Oswaldo Done, MD Specialty: Interventional Pain Management Specialty designation: 09 Location: Outpatient facility Ref. Prov.: Delano Metz, MD       Interventional Therapy   Type: Lumbar Facet, Medial Branch Block(s) (w/ fluoroscopic mapping) L3R1  Laterality: Bilateral  Level: L2, L3, L4, L5, and S1 Medial Branch Level(s). Injecting these levels blocks the L3-4, L4-5, and L5-S1 lumbar facet joints. Imaging: Fluoroscopic guidance Spinal ([id number]) Anesthesia: Local anesthesia (1-2% Lidocaine) Anxiolysis: IV Versed 3.0 mg Sedation: Moderate Sedation None required. No Fentanyl administered.         DOS: 06/01/2023 Performed by: Oswaldo Done, MD  Primary Purpose: Diagnostic/Therapeutic Indications: Low back pain severe enough to impact quality of life or function. 1. Chronic low back pain (Bilateral) w/o sciatica   2. Lumbar facet joint pain   3. Lumbar facet joint syndrome (Left)   4. Lumbar facet arthropathy (Multilevel) (Bilateral)   5. Spondylosis without myelopathy or radiculopathy, lumbosacral region   6. 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 (year old), female patient, seen today for interventional treatment. She  has a past surgical history that includes Carpal tunnel release (Bilateral); Back surgery; Breast surgery (Bilateral); Cholecystectomy; Colonoscopy w/ polypectomy; Cataract extraction w/ intraocular lens implant (Right); Abdominal hysterectomy; Fracture surgery (Right); Cardiac catheterization (2012); and Pain pump revision (Left, 07/07/2017). Tiffany Hunter has a current medication list which includes the following prescription(s): atorvastatin, carvedilol, celecoxib, diethylpropion hcl cr, losartan, meloxicam, naloxone, NON FORMULARY, omeprazole, oxybutynin, ssd, albuterol, and naloxegol oxalate, and the following Facility-Administered Medications: fentanyl. Her primarily concern today is the Back Pain (lower)  Initial Vital Signs:  Pulse/HCG Rate: 69ECG Heart Rate: 72 (nsr) Temp: (!) 97.3 F (36.3 C) Resp: 18 BP: (!) 158/81 SpO2: 100 %  BMI: Estimated body mass index is 28.34 kg/m as calculated from the following:   Height as of this encounter: 5\' 3"  (1.6 m).   Weight as of this encounter: 160 lb (72.6 kg).  Risk Assessment: Allergies: Reviewed. She is allergic to doxycycline hyclate and sulfa antibiotics.  Allergy Precautions: None required Coagulopathies: Reviewed. None identified.  Blood-thinner therapy: None at this time Active Infection(s): Reviewed. None identified. Tiffany Hunter is afebrile  Site Confirmation: Tiffany Hunter was asked to confirm the procedure and laterality before marking the site Procedure checklist: Completed Consent: Before the  procedure and under the influence of no sedative(s), amnesic(s), or anxiolytics, the patient was informed of the treatment options, risks and possible complications.  To fulfill our ethical and legal obligations, as recommended by the American Medical Association's Code of Ethics, I have informed the patient of my clinical impression; the nature and purpose of the treatment or procedure; the risks, benefits, and possible complications of the intervention; the alternatives, including doing nothing; the risk(s) and benefit(s) of the alternative treatment(s) or procedure(s); and the risk(s) and benefit(s) of doing nothing. The patient was provided information about the general risks and possible complications associated with the procedure. These may include, but are not limited to: failure to achieve desired goals, infection, bleeding, organ or nerve damage, allergic reactions, paralysis, and death. In addition, the patient was informed of those risks and complications associated to Spine-related procedures, such as failure to decrease pain; infection (i.e.: Meningitis, epidural or intraspinal abscess); bleeding (i.e.: epidural hematoma, subarachnoid hemorrhage, or any other type of intraspinal or peri-dural bleeding); organ or nerve damage (i.e.: Any type of peripheral nerve, nerve root, or spinal cord injury) with subsequent damage to sensory, motor, and/or autonomic systems, resulting in permanent pain, numbness, and/or weakness of one or several areas of the body; allergic reactions; (i.e.: anaphylactic reaction); and/or death. Furthermore, the patient was informed of those risks and complications associated with the medications. These include, but are not limited to: allergic reactions (i.e.: anaphylactic or anaphylactoid reaction(s)); adrenal axis suppression; blood sugar elevation that in diabetics may result in ketoacidosis or comma; water retention that in patients with history of congestive heart failure  may result in shortness of breath, pulmonary edema, and decompensation with resultant heart failure; weight gain; swelling or edema; medication-induced neural toxicity; particulate matter embolism and blood vessel occlusion with resultant organ, and/or nervous system infarction; and/or aseptic necrosis of one or more joints. Finally, the patient was informed that Medicine is not an exact science; therefore, there is also the possibility of unforeseen or unpredictable risks and/or possible complications that may result in a catastrophic outcome. The patient indicated having understood very clearly. We have given the patient no guarantees and we have made no promises. Enough time was given to the patient to ask questions, all of which were answered to the patient's satisfaction. Ms. Goldberg has indicated that she wanted to continue with the procedure. Attestation: I, the ordering provider, attest that I have discussed with the patient the benefits, risks, side-effects, alternatives, likelihood of achieving goals, and potential problems during recovery for the procedure that I have provided informed consent. Date  Time: 06/01/2023  8:10 AM   Pre-Procedure Preparation:  Monitoring: As per clinic protocol. Respiration, ETCO2, SpO2, BP, heart rate and rhythm monitor placed and checked for adequate function Safety Precautions: Patient was assessed for positional comfort and pressure points before starting the procedure. Time-out: I initiated and conducted the "Time-out" before starting the procedure, as per protocol. The patient was asked to participate by confirming the accuracy of the "Time Out" information. Verification of the correct person, site, and procedure were performed and confirmed by me, the nursing staff, and the patient. "Time-out" conducted as per Joint Commission's Universal Protocol (UP.01.01.01). Time: 0905 Start Time: 0905 hrs.  Description of Procedure:          Laterality: (see  above) Targeted Levels: (see above)  Safety Precautions: Aspiration looking for blood return was conducted prior to all injections. At no point did we inject any substances, as a needle was being advanced. Before injecting, the patient was told to immediately notify me if she was experiencing any new onset of "ringing in the ears, or metallic  taste in the mouth". No attempts were made at seeking any paresthesias. Safe injection practices and needle disposal techniques used. Medications properly checked for expiration dates. SDV (single dose vial) medications used. After the completion of the procedure, all disposable equipment used was discarded in the proper designated medical waste containers. Local Anesthesia: Protocol guidelines were followed. The patient was positioned over the fluoroscopy table. The area was prepped in the usual manner. The time-out was completed. The target area was identified using fluoroscopy. A 12-in long, straight, sterile hemostat was used with fluoroscopic guidance to locate the targets for each level blocked. Once located, the skin was marked with an approved surgical skin marker. Once all sites were marked, the skin (epidermis, dermis, and hypodermis), as well as deeper tissues (fat, connective tissue and muscle) were infiltrated with a small amount of a short-acting local anesthetic, loaded on a 10cc syringe with a 25G, 1.5-in  Needle. An appropriate amount of time was allowed for local anesthetics to take effect before proceeding to the next step. Local Anesthetic: Lidocaine 2.0% The unused portion of the local anesthetic was discarded in the proper designated containers. Technical description of process:  L2 Medial Branch Nerve Block (MBB): The target area for the L2 medial branch is at the junction of the postero-lateral aspect of the superior articular process and the superior, posterior, and medial edge of the transverse process of L3. Under fluoroscopic guidance, a  Quincke needle was inserted until contact was made with os over the superior postero-lateral aspect of the pedicular shadow (target area). After negative aspiration for blood, 0.5 mL of the nerve block solution was injected without difficulty or complication. The needle was removed intact. L3 Medial Branch Nerve Block (MBB): The target area for the L3 medial branch is at the junction of the postero-lateral aspect of the superior articular process and the superior, posterior, and medial edge of the transverse process of L4. Under fluoroscopic guidance, a Quincke needle was inserted until contact was made with os over the superior postero-lateral aspect of the pedicular shadow (target area). After negative aspiration for blood, 0.5 mL of the nerve block solution was injected without difficulty or complication. The needle was removed intact. L4 Medial Branch Nerve Block (MBB): The target area for the L4 medial branch is at the junction of the postero-lateral aspect of the superior articular process and the superior, posterior, and medial edge of the transverse process of L5. Under fluoroscopic guidance, a Quincke needle was inserted until contact was made with os over the superior postero-lateral aspect of the pedicular shadow (target area). After negative aspiration for blood, 0.5 mL of the nerve block solution was injected without difficulty or complication. The needle was removed intact. L5 Medial Branch Nerve Block (MBB): The target area for the L5 medial branch is at the junction of the postero-lateral aspect of the superior articular process and the superior, posterior, and medial edge of the sacral ala. Under fluoroscopic guidance, a Quincke needle was inserted until contact was made with os over the superior postero-lateral aspect of the pedicular shadow (target area). After negative aspiration for blood, 0.5 mL of the nerve block solution was injected without difficulty or complication. The needle was  removed intact. S1 Medial Branch Nerve Block (MBB): The target area for the S1 medial branch is at the posterior and inferior 6 o'clock position of the L5-S1 facet joint. Under fluoroscopic guidance, the Quincke needle inserted for the L5 MBB was redirected until contact was made with os over  the inferior and postero aspect of the sacrum, at the 6 o' clock position under the L5-S1 facet joint (Target area). After negative aspiration for blood, 0.5 mL of the nerve block solution was injected without difficulty or complication. The needle was removed intact.  Once the entire procedure was completed, the treated area was cleaned, making sure to leave some of the prepping solution back to take advantage of its long term bactericidal properties.         Illustration of the posterior view of the lumbar spine and the posterior neural structures. Laminae of L2 through S1 are labeled. DPRL5, dorsal primary ramus of L5; DPRS1, dorsal primary ramus of S1; DPR3, dorsal primary ramus of L3; FJ, facet (zygapophyseal) joint L3-L4; I, inferior articular process of L4; LB1, lateral branch of dorsal primary ramus of L1; IAB, inferior articular branches from L3 medial branch (supplies L4-L5 facet joint); IBP, intermediate branch plexus; MB3, medial branch of dorsal primary ramus of L3; NR3, third lumbar nerve root; S, superior articular process of L5; SAB, superior articular branches from L4 (supplies L4-5 facet joint also); TP3, transverse process of L3.   Facet Joint Innervation (* possible contribution)  L1-2 T12, L1 (L2*)  Medial Branch  L2-3 L1, L2 (L3*)         "          "  L3-4 L2, L3 (L4*)         "          "  L4-5 L3, L4 (L5*)         "          "  L5-S1 L4, L5, S1          "          "    Vitals:   06/01/23 0902 06/01/23 0907 06/01/23 0912 06/01/23 0917  BP: (!) 173/81 (!) 175/80 (!) 182/74 (!) 171/74  Pulse:      Resp: 18 17 15 18   Temp:      SpO2: 100% 100% 100% 100%  Weight:      Height:          End Time: 0916 hrs.  Imaging Guidance (Spinal):          Type of Imaging Technique: Fluoroscopy Guidance (Spinal) Indication(s): Fluoroscopy guidance for needle placement to enhance accuracy in procedures requiring precise needle localization for targeted delivery of medication in or near specific anatomical locations not easily accessible without such real-time imaging assistance. Exposure Time: Please see nurses notes. Contrast: None used. Fluoroscopic Guidance: I was personally present during the use of fluoroscopy. "Tunnel Vision Technique" used to obtain the best possible view of the target area. Parallax error corrected before commencing the procedure. "Direction-depth-direction" technique used to introduce the needle under continuous pulsed fluoroscopy. Once target was reached, antero-posterior, oblique, and lateral fluoroscopic projection used confirm needle placement in all planes. Images permanently stored in EMR. Interpretation: No contrast injected. I personally interpreted the imaging intraoperatively. Adequate needle placement confirmed in multiple planes. Permanent images saved into the patient's record.  Post-operative Assessment:  Post-procedure Vital Signs:  Pulse/HCG Rate: 6972 (nsr) Temp: (!) 97.3 F (36.3 C) Resp: 18 BP: (!) 171/74 SpO2: 100 %  EBL: None  Complications: No immediate post-treatment complications observed by team, or reported by patient.  Note: The patient tolerated the entire procedure well. A repeat set of vitals were taken after the procedure and the patient was kept under observation following institutional policy, for  this type of procedure. Post-procedural neurological assessment was performed, showing return to baseline, prior to discharge. The patient was provided with post-procedure discharge instructions, including a section on how to identify potential problems. Should any problems arise concerning this procedure, the patient was given  instructions to immediately contact us, at any time, without hesitation. In any case, we plan to contact the patient by telephone for a follow-up status report regarding this interventional procedure.  Comments:  No additional relevant information.  Plan of Care (POC)  Orders:  Orders Placed This Encounter  Procedures   LUMBAR FACET(MEDIAL BRANCH NERVE BLOCK) MBNB    Scheduling Instructions:     Procedure: Lumbar facet block (AKA.: Lumbosacral medial branch nerve block)     Side: Bilateral     Level: L4-5, L5-S1, and TBD Facets (L3, L4, L5, S1, and TBD Medial Branch Nerves)     Sedation: Patient's choice.     Timeframe: Today    Where will this procedure be performed?:   [facility]   [facility] C-ARM 1-60 MIN NO REPORT    Intraoperative interpretation by procedural physician at [facility].    Standing Status:   Standing    Number of Occurrences:   1    Reason for exam::   Assistance in needle guidance and placement for procedures requiring needle placement in or near specific anatomical locations not easily accessible without such assistance.   Informed Consent Details: Physician/Practitioner Attestation; Transcribe to consent form and obtain patient signature    Nursing Order: Transcribe to consent form and obtain patient signature. Note: Always confirm laterality of pain with Ms. Bigley, before procedure.    Physician/Practitioner attestation of informed consent for procedure/surgical case:   I, the physician/practitioner, attest that I have discussed with the patient the benefits, risks, side effects, alternatives, likelihood of achieving goals and potential problems during recovery for the procedure that I have provided informed consent.    Procedure:   Lumbar Facet Block  under fluoroscopic guidance    Physician/Practitioner performing the procedure:   Irwin Toran A. Laban Emperor MD    Indication/Reason:   Low Back Pain, with our without leg pain, due to Facet  Joint Arthralgia (Joint Pain) Spondylosis (Arthritis of the Spine), without myelopathy or radiculopathy (Nerve Damage).   Provide equipment / supplies at bedside    Procedure tray: "Block Tray" (Disposable  single use) Skin infiltration needle: Regular 1.5-in, 25-G, (x1) Block Needle type: Spinal Amount/quantity: 4 Size: Medium (5-inch) Gauge: 22G    Standing Status:   Standing    Number of Occurrences:   1    Specify:   Block Tray   Saline lock IV    Have LR [phone] mL available and administer at 125 mL/hr if patient becomes hypotensive.    Standing Status:   Standing    Number of Occurrences:   1   Chronic Opioid Analgesic:   No other oral opioid analgesics prescribed by our practice. Intrathecal PF-Hydromorphone 3.9295 mg/day + PF-Sufentanil 34.383 mcg/day. MME: [employer]. 58.7 mg/day.   Medications ordered for procedure: Meds ordered this encounter  Medications   lidocaine (XYLOCAINE) 2 % (with pres) injection 400 mg   pentafluoroprop-tetrafluoroeth (GEBAUERS) aerosol   midazolam (VERSED) 5 MG/5ML injection 0.5-2 mg    Make sure Flumazenil is available in the pyxis when using this medication. If oversedation occurs, administer 0.2 mg IV over 15 sec. If after 45 sec no response, administer 0.2 mg again over 1 min; may repeat at 1 min  intervals; not to exceed 4 doses (1 mg)   fentaNYL (SUBLIMAZE) injection 25-50 mcg    Make sure Narcan is available in the pyxis when using this medication. In the event of respiratory depression (RR< 8/min): Titrate NARCAN (naloxone) in increments of 0.1 to 0.2 mg IV at 2-3 minute intervals, until desired degree of reversal.   ropivacaine (PF) 2 mg/mL (0.2%) (NAROPIN) injection 18 mL   triamcinolone acetonide (KENALOG-40) injection 80 mg   Medications administered: We administered lidocaine, pentafluoroprop-tetrafluoroeth, midazolam, ropivacaine (PF) 2 mg/mL (0.2%), and triamcinolone acetonide.  See the medical record for exact dosing, route, and  time of administration.  Follow-up plan:   Return for Pump Refill (Max:74mo), (PPE).       Interventional Therapies  Risk  Complexity Considerations:   Presence of intrathecal pump  DM  CAD  Pulmonary hypertension    Planned  Pending:   Diagnostic/therapeutic bilateral lumbar (L4-5, L5-S1) facet MBB L3R1  Palliative intrathecal pump refills    Under consideration:   Diagnostic left lumbar facet MBB #3  Possible left lumbar facet RFA #1   Completed:   Diagnostic/therapeutic right caudal ESI x1 (01/04/2022) (100/100/100/100) (of right leg pain)  Diagnostic left lumbar facet MBB x2 (12/22/2016) (07/14/2020) (100/100/100/100) (of left low back pain)    Therapeutic  Palliative (PRN) options:   Therapeutic/palliative left sided lumbar facet MBB #3        Recent Visits Date Type Provider Dept  05/30/23 Procedure visit Delano Metz, MD [facility]  04/26/23 Office Visit Delano Metz, MD [facility]  03/14/23 Procedure visit Delano Metz, MD [facility]  Showing recent visits within past 90 days and meeting all other requirements Today's Visits Date Type Provider Dept  06/01/23 Procedure visit Delano Metz, MD [facility]  Showing today's visits and meeting all other requirements Future Appointments Date Type Provider Dept  08/08/23 Appointment Delano Metz, MD [facility]  Showing future appointments within next 90 days and meeting all other requirements  Disposition: Discharge home  Discharge (Date  Time): 06/01/2023;   hrs.   Primary Care Physician: Chauncy Lean, PA-C Location: [facility] Note by: Oswaldo Done, MD (TTS technology used. I apologize for any typographical errors that were not detected and corrected.) Date: 06/01/2023; Time: 9:21 AM  Disclaimer:  Medicine is not an Visual merchandiser. The only guarantee in medicine is that nothing is  guaranteed. It is important to note that the decision to proceed with this intervention was based on the information collected from the patient. The Data and conclusions were drawn from the patient's questionnaire, the interview, and the physical examination. Because the information was provided in large part by the patient, it cannot be guaranteed that it has not been purposely or unconsciously manipulated. Every effort has been made to obtain as much relevant data as possible for this evaluation. It is important to note that the conclusions that lead to this procedure are derived in large part from the available data. Always take into account that the treatment will also be dependent on availability of resources and existing treatment guidelines, considered by other Pain Management Practitioners as being common knowledge and practice, at the time of the intervention. For Medico-Legal purposes, it is also important to point out that variation in procedural techniques and pharmacological choices are the acceptable norm. The indications, contraindications, technique, and results of the above procedure should only be interpreted and judged by a Board-Certified Interventional Pain Specialist with extensive familiarity  and expertise in the same exact procedure and technique.

## 2023-06-02 ENCOUNTER — Telehealth: Payer: Self-pay | Admitting: *Deleted

## 2023-06-02 NOTE — Telephone Encounter (Signed)
Post procedure call;  son reports that she is doing okay.

## 2023-07-11 ENCOUNTER — Other Ambulatory Visit: Payer: Self-pay

## 2023-07-11 MED ORDER — PAIN MANAGEMENT IT PUMP REFILL: 1.0000 | Freq: Once | INTRATHECAL | 0 refills | Status: AC

## 2023-08-08 ENCOUNTER — Ambulatory Visit (HOSPITAL_BASED_OUTPATIENT_CLINIC_OR_DEPARTMENT_OTHER): Payer: Medicare Other | Admitting: Pain Medicine

## 2023-08-08 DIAGNOSIS — Z451 Encounter for adjustment and management of infusion pump: Secondary | ICD-10-CM

## 2023-08-08 DIAGNOSIS — Z91199 Patient's noncompliance with other medical treatment and regimen due to unspecified reason: Secondary | ICD-10-CM

## 2023-08-08 NOTE — Progress Notes (Signed)
(  08/08/2023) NO-SHOW to intrathecal pump refill.

## 2023-08-09 ENCOUNTER — Ambulatory Visit: Attending: Pain Medicine | Admitting: Pain Medicine

## 2023-08-09 ENCOUNTER — Encounter: Payer: Self-pay | Admitting: Pain Medicine

## 2023-08-09 VITALS — BP 162/85 | HR 89 | Temp 97.4°F | Resp 18 | Ht 62.0 in | Wt 153.0 lb

## 2023-08-09 DIAGNOSIS — G894 Chronic pain syndrome: Secondary | ICD-10-CM | POA: Insufficient documentation

## 2023-08-09 DIAGNOSIS — M79605 Pain in left leg: Secondary | ICD-10-CM | POA: Insufficient documentation

## 2023-08-09 DIAGNOSIS — M5442 Lumbago with sciatica, left side: Secondary | ICD-10-CM | POA: Diagnosis present

## 2023-08-09 DIAGNOSIS — M961 Postlaminectomy syndrome, not elsewhere classified: Secondary | ICD-10-CM | POA: Diagnosis present

## 2023-08-09 DIAGNOSIS — M5416 Radiculopathy, lumbar region: Secondary | ICD-10-CM | POA: Diagnosis present

## 2023-08-09 DIAGNOSIS — M47816 Spondylosis without myelopathy or radiculopathy, lumbar region: Secondary | ICD-10-CM | POA: Insufficient documentation

## 2023-08-09 DIAGNOSIS — M5410 Radiculopathy, site unspecified: Secondary | ICD-10-CM | POA: Diagnosis present

## 2023-08-09 DIAGNOSIS — M51372 Other intervertebral disc degeneration, lumbosacral region with discogenic back pain and lower extremity pain: Secondary | ICD-10-CM | POA: Diagnosis present

## 2023-08-09 DIAGNOSIS — M5459 Other low back pain: Secondary | ICD-10-CM | POA: Diagnosis present

## 2023-08-09 DIAGNOSIS — Z79891 Long term (current) use of opiate analgesic: Secondary | ICD-10-CM | POA: Diagnosis present

## 2023-08-09 DIAGNOSIS — Z451 Encounter for adjustment and management of infusion pump: Secondary | ICD-10-CM | POA: Diagnosis present

## 2023-08-09 DIAGNOSIS — Z79899 Other long term (current) drug therapy: Secondary | ICD-10-CM | POA: Insufficient documentation

## 2023-08-09 DIAGNOSIS — Z978 Presence of other specified devices: Secondary | ICD-10-CM | POA: Insufficient documentation

## 2023-08-09 DIAGNOSIS — G8929 Other chronic pain: Secondary | ICD-10-CM | POA: Diagnosis present

## 2023-08-09 MED ORDER — PAIN MANAGEMENT IT PUMP REFILL: 1.0000 | Freq: Once | INTRATHECAL | 0 refills | Status: AC

## 2023-08-09 NOTE — Patient Instructions (Signed)

## 2023-08-09 NOTE — Progress Notes (Signed)
 PROVIDER NOTE: Interpretation of information contained herein should be left to medically-trained personnel. Specific patient instructions are provided elsewhere under "Patient Instructions" section of medical record. This document was created in part using STT-dictation technology, any transcriptional errors that may result from this process are unintentional.  Patient: Tiffany Hunter Type: Established DOB: February 03, 1946 MRN: [id number] PCP: Chauncy Lean, PA-C  Service: Procedure DOS: 08/09/2023 Setting: Ambulatory Location: Ambulatory outpatient facility Delivery: Face-to-face Provider: Oswaldo Done, MD Specialty: Interventional Pain Management Specialty designation: 09 Location: Outpatient facility Ref. Prov.: Chauncy Lean, PA-C       Interventional Therapy   Primary Reason for Visit: Interventional Pain Management Treatment. CC: Back Pain (lower)  Procedure:          Type: Management of Intrathecal Drug Delivery System (IDDS) - Reservoir Refill (57846). No rate change.  Indications: 1. Chronic pain syndrome   2. 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 Chronic lower extremity pain (2ry area of Pain) (Left)   4. Chronic lower extremity radicular pain (S1) (Right)   5. Chronic lumbar radicular pain (L4 Dermatome) (Left)   6. DDD (degenerative disc disease), lumbosacral w/ LBP & LEP   7. 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 Lumbar facet joint pain   9. Lumbar facet joint syndrome (Left)   10. Lumbar facet arthropathy (Multilevel) (Bilateral)   11. Pharmacologic therapy   12. 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 Chronic use of opiate for therapeutic purpose   14. Encounter for adjustment and management of infusion pump   15. Encounter for interrogation of infusion pump   16. Encounter for medication management   17. Encounter for chronic pain management    Pain  Assessment: Self-Reported Pain Score: 0-No pain/10             Reported level is compatible with observation.       Discussed the use of AI scribe software for clinical note transcription with the patient, who gave verbal consent to proceed.  History of Present Illness   Tiffany Hunter is a 78 year old female who presents for an intrathecal pump refill.  She is here for an intrathecal pump refill. The pump is used to deliver medication directly into the spinal fluid to manage pain effectively.  The intrathecal pump contains hydromorphone at 8 mg/mL and sufentanil at 70 mcg/mL, with a total volume of 40 mL. These medications are compounded and require careful handling to maintain sterility and integrity.  There have been issues with cracked syringes from the pharmacy, necessitating careful inspection before use to prevent contamination and ensure patient safety.         Intrathecal Drug Delivery System (IDDS)  Pump Device:  Manufacturer: Medtronic Model: Synchromed II Model No.: [id number] Serial No.: [id number] Delivery Route: Intrathecal Type: Programmable  Volume (mL): 40 mL reservoir Priming Volume: N/A  Calibration Constant: 114.0  MRI compatibility: Conditional   Implant Details:  Date: 07/07/2017 Implanter: Georges Mouse, MD Contact Information: [facility] [phone] Last Revision/Replacement: 07/07/2017 Estimated Replacement Date: Nov/2025  Implant Site: Abdominal Laterality: Left  Catheter: Manufacturer: Medtronic Model:  Intrathecal catheter Model No.: 8731  Serial No.: n/a  Implanted Length (cm): 28.9  Catheter Volume (mL): 0.209  Tip Location (Level): T11 (Left) Canal Access Site: L2-3  Drug content:  Primary Medication Class: Opioid  Medication: PF-Hydromorphone (Dilaudid)  Concentration: 8 mg/mL   Secondary Medication Class: Opioid  Medication: PF-Sufentanil  Concentration: 70 mcg/mL   Tertiary Medication Class: none  Medication:  none   PA parameters (PCA-mode):  Mode: Off (Inactive)  Programming:  Type: Simple continuous.  Medication, Concentration, Infusion Program, & Delivery Rate: For up-to-date details please see most recent scanned programming printout.   Intrathecal Pump Therapy Assessment  Manufacturer: Medtronic Synchromed Type: Programmable Volume: 40 mL reservoir MRI compatibility: Yes    Drug content:  Primary Medication Class: Opioid Primary Medication: PF-Hydromorphone (Dilaudid) (8 mg/mL)  Secondary Medication: PF-Sufentanil (70 g/mL)  Other Medication: No third    Programming:  Type: Simple continuous. See pump readout for details.     Changes:  Medication Change: None at this point Rate Change: No change in rate  Reported side-effects or adverse reactions: None reported  Effectiveness: Described as relatively effective, allowing for increase in activities of daily living (ADL) Clinically meaningful improvement in function (CMIF): Sustained CMIF goals met  Plan: Pump refill today   Pharmacotherapy Assessment   Opioid Analgesic:  No other oral opioid analgesics prescribed by our practice. Intrathecal PF-Hydromorphone 3.9295 mg/day + PF-Sufentanil 34.383 mcg/day. MME: Aprox. 58.7 mg/day.   Monitoring: [place] PMP: PDMP reviewed during this encounter.       Pharmacotherapy: No side-effects or adverse reactions reported. Compliance: No problems identified. Effectiveness: Clinically acceptable. Plan: Refer to "POC". UDS:  Summary  Date Value Ref Range Status  04/26/2023 FINAL  Final    Comment:    ==================================================================== ToxASSURE Select 13 (MW) ==================================================================== Specimen Alert ToxAssure, ToxAssure FLEX or MAT drug testing: -Technical component- Result certification performed at [facility], [address] [place], [place] [address]. [phone] Lab Director  Cherylann Ratel, PhrmD. ==================================================================== Test                             Result       Flag       Units  Drug Present and Declared for Prescription Verification   Hydromorphone                  2187         EXPECTED   ng/mg creat    Hydromorphone may be administered as a scheduled prescription    medication; it is also an expected metabolite of hydrocodone.  Drug Absent but Declared for Prescription Verification   Sufentanil                     Not Detected UNEXPECTED ng/mg creat ==================================================================== Test                      Result    Flag   Units      Ref Range   Creatinine              83               mg/dL      >=82 ==================================================================== Declared Medications:  The flagging and interpretation on this report are based on the  following declared medications.  Unexpected results may arise from  inaccuracies in the declared medications.   **Note: The testing scope of this panel includes these medications:   Hydromorphone  Sufentanil   **Note: The testing scope of this panel does not include the  following reported medications:   Albuterol (Ventolin HFA)  Atorvastatin (Lipitor)  Carvedilol (Coreg)  Celecoxib (Celebrex)  Diethylpropion  Losartan (Cozaar)  Meloxicam (Mobic)  Naloxegol (Movantik)  Naloxone (Narcan)  Omeprazole (Prilosec)  Oxybutynin (Ditropan)  Sulfadiazine (SSD) ==================================================================== For clinical consultation, please call [phone]. ====================================================================    No results found for: "CBDTHCR", "D8THCCBX", "D9THCCBX"   H&P (Pre-op Assessment):  Ms. Aldous is a 78 y.o. (year old), female patient, seen today for interventional treatment. She  has a past surgical history that includes Carpal tunnel release (Bilateral);  Back surgery; Breast surgery (Bilateral); Cholecystectomy; Colonoscopy w/ polypectomy; Cataract extraction w/ intraocular lens implant (Right); Abdominal hysterectomy; Fracture surgery (Right); Cardiac catheterization (2012); and Pain pump revision (Left, 07/07/2017). Ms. Tortorelli has a current medication list which includes the following prescription(s): atorvastatin, carvedilol, celecoxib, diethylpropion hcl cr, losartan, meloxicam, NON FORMULARY, omeprazole, oxybutynin, PAIN MANAGEMENT IT PUMP REFILL, ssd, albuterol, and naloxegol oxalate. Her primarily concern today is the Back Pain (lower)  Initial Vital Signs:  Pulse/HCG Rate: 89  Temp: (!) 97.4 F (36.3 C) Resp: 18 BP: (!) 192/102 SpO2: 100 %  BMI: Estimated body mass index is 27.98 kg/m as calculated from the following:   Height as of this encounter: 5\' 2"  (1.575 m).   Weight as of this encounter: 153 lb (69.4 kg).  Risk Assessment: Allergies: Reviewed. She is allergic to doxycycline hyclate and sulfa antibiotics.  Allergy Precautions: None required Coagulopathies: Reviewed. None identified.  Blood-thinner therapy: None at this time Active Infection(s): Reviewed. None identified. Ms. Genson is afebrile  Site Confirmation: Ms. Bitner was asked to confirm the procedure and laterality before marking the site Procedure checklist: Completed Consent: Before the procedure and under the influence of no sedative(s), amnesic(s), or anxiolytics, the patient was informed of the treatment options, risks and possible complications. To fulfill our ethical and legal obligations, as recommended by the American Medical Association's Code of Ethics, I have informed the patient of my clinical impression; the nature and purpose of the treatment or procedure; the risks, benefits, and possible complications of the intervention; the alternatives, including doing nothing; the risk(s) and benefit(s) of the alternative treatment(s) or procedure(s); and the  risk(s) and benefit(s) of doing nothing.  Ms. Venturino was provided with information about the general risks and possible complications associated with most interventional procedures. These include, but are not limited to: failure to achieve desired goals, infection, bleeding, organ or nerve damage, allergic reactions, paralysis, and/or death.  In addition, she was informed of those risks and possible complications associated to this particular procedure, which include, but are not limited to: damage to the implant; failure to decrease pain; local, systemic, or serious CNS infections, intraspinal abscess with possible cord compression and paralysis, or life-threatening such as meningitis; bleeding; organ damage; nerve injury or damage with subsequent sensory, motor, and/or autonomic system dysfunction, resulting in transient or permanent pain, numbness, and/or weakness of one or several areas of the body; allergic reactions, either minor or major life-threatening, such as anaphylactic or anaphylactoid reactions.  Furthermore, Ms. Willers was informed of those risks and complications associated with the medications. These include, but are not limited to: allergic reactions (i.e.: anaphylactic or anaphylactoid reactions); endorphine suppression; bradycardia and/or hypotension; water retention and/or peripheral vascular relaxation leading to lower extremity edema and possible stasis ulcers; respiratory depression and/or shortness of breath; decreased metabolic rate leading to weight gain; swelling or edema; medication-induced neural toxicity; particulate matter embolism and blood vessel occlusion with resultant organ, and/or nervous system infarction; and/or intrathecal granuloma formation with possible spinal cord compression and permanent paralysis.  Before refilling the pump Ms. Broxton was informed that some of the medications used in the devise may not be  FDA approved for such use and therefore it  constitutes an off-label use of the medications.  Finally, she was informed that Medicine is not an exact science; therefore, there is also the possibility of unforeseen or unpredictable risks and/or possible complications that may result in a catastrophic outcome. The patient indicated having understood very clearly. We have given the patient no guarantees and we have made no promises. Enough time was given to the patient to ask questions, all of which were answered to the patient's satisfaction. Ms. Eilert has indicated that she wanted to continue with the procedure. Attestation: I, the ordering provider, attest that I have discussed with the patient the benefits, risks, side-effects, alternatives, likelihood of achieving goals, and potential problems during recovery for the procedure that I have provided informed consent. Date  Time: 08/09/2023  9:54 AM  Pre-Procedure Preparation:  Monitoring: As per clinic protocol. Respiration, ETCO2, SpO2, BP, heart rate and rhythm monitor placed and checked for adequate function Safety Precautions: Patient was assessed for positional comfort and pressure points before starting the procedure. Time-out: I initiated and conducted the "Time-out" before starting the procedure, as per protocol. The patient was asked to participate by confirming the accuracy of the "Time Out" information. Verification of the correct person, site, and procedure were performed and confirmed by me, the nursing staff, and the patient. "Time-out" conducted as per Joint Commission's Universal Protocol (UP.01.01.01). Time: 1009 Start Time: 1009 hrs.  Description of Procedure:          Position: Supine Target Area: Central-port of intrathecal pump. Approach: Anterior, 90 degree angle approach. Area Prepped: Entire Area around the pump implant. ChloraPrep (2% chlorhexidine gluconate and 70% isopropyl alcohol) Safety Precautions: Aspiration looking for blood return was conducted prior to  all injections. At no point did we inject any substances, as a needle was being advanced. No attempts were made at seeking any paresthesias. Safe injection practices and needle disposal techniques used. Medications properly checked for expiration dates. SDV (single dose vial) medications used. Description of the Procedure: Protocol guidelines were followed. Two nurses trained to do implant refills were present during the entire procedure. The refill medication was checked by both healthcare providers as well as the patient. The patient was included in the "Time-out" to verify the medication. The patient was placed in position. The pump was identified. The area was prepped in the usual manner. The sterile template was positioned over the pump, making sure the side-port location matched that of the pump. Both, the pump and the template were held for stability. The needle provided in the Medtronic Kit was then introduced thru the center of the template and into the central port. The pump content was aspirated and discarded volume documented. The new medication was slowly infused into the pump, thru the filter, making sure to avoid overpressure of the device. The needle was then removed and the area cleansed, making sure to leave some of the prepping solution back to take advantage of its long term bactericidal properties. The pump was interrogated and programmed to reflect the correct medication, volume, and dosage. The program was printed and taken to the physician for approval. Once checked and signed by the physician, a copy was provided to the patient and another scanned into the EMR.  Vitals:   08/09/23 0954 08/09/23 1000  BP: (!) 192/102 (!) 162/85  Pulse: 89   Resp: 18   Temp: (!) 97.4 F (36.3 C)   TempSrc: Temporal   SpO2: 100%   Weight:  153 lb (69.4 kg)   Height: 5\' 2"  (1.575 m)     Start Time: 1009 hrs. End Time: 1020 hrs. Materials & Medications: Medtronic Refill Kit Medication(s): Please  see chart orders for details.  Type of Imaging Technique: None used Indication(s): N/A Exposure Time: No patient exposure Contrast: None used. Fluoroscopic Guidance: N/A Ultrasound Guidance: N/A Interpretation: N/A  Antibiotic Prophylaxis:   Anti-infectives (From admission, onward)    None      Indication(s): None identified  Post-operative Assessment:  Post-procedure Vital Signs:  Pulse/HCG Rate: 89  Temp: (!) 97.4 F (36.3 C) Resp: 18 BP: (!) 162/85 SpO2: 100 %  EBL: None  Complications: No immediate post-treatment complications observed by team, or reported by patient.  Note: The patient tolerated the entire procedure well. A repeat set of vitals were taken after the procedure and the patient was kept under observation following institutional policy, for this type of procedure. Post-procedural neurological assessment was performed, showing return to baseline, prior to discharge. The patient was provided with post-procedure discharge instructions, including a section on how to identify potential problems. Should any problems arise concerning this procedure, the patient was given instructions to immediately contact us, at any time, without hesitation. In any case, we plan to contact the patient by telephone for a follow-up status report regarding this interventional procedure.  Comments:  No additional relevant information.  Plan of Care (POC)  Assessment and Plan    Intrathecal pump management   She is here for an intrathecal pump refill with hydromorphone 8 mg/mL and sufentanil 70 mcg/mL in a 40 mL volume. The compounded medication requires careful handling to maintain sterility. There have been issues with cracked syringes from the pharmacy, necessitating notification and replacement if compromised. Perform the refill as per protocol, ensuring two nurses verify the process. Inspect syringes for cracks and notify the pharmacy if needed.  Intrathecal pump replacement    Discussed the potential need for pump replacement, which involves removing the old pump and connecting a new one, typically in the OR. Approval is required, and the procedure involves several preparatory steps. Evaluate the need for replacement and, if necessary, schedule the procedure in the OR using local anesthesia.      Orders:  Orders Placed This Encounter  Procedures   PUMP REFILL    Maintain Protocol by having two(2) healthcare providers during procedure and programming.    Scheduling Instructions:     Please refill intrathecal pump today.    Where will this procedure be performed?:   [facility]   PUMP REFILL    Whenever possible schedule on a procedure today.    Standing Status:   Future    Expiration Date:   12/09/2023    Scheduling Instructions:     Please schedule intrathecal pump refill based on pump programming. Avoid schedule intervals of more than 120 days (4 months).    Where will this procedure be performed?:   [facility]   Remove and insert drug implant    Standing Status:   Future    Expiration Date:   12/09/2023    Scheduling Instructions:     Please schedule an intrathecal pump implant in same day surgery. Call the Medtronic representative and schedule them to be available in the OR for procedure.   Informed Consent Details: Physician/Practitioner Attestation; Transcribe to consent form and obtain patient signature    Transcribe to consent form and obtain patient signature.    Physician/Practitioner attestation of informed consent for  procedure/surgical case:   I, the physician/practitioner, attest that I have discussed with the patient the benefits, risks, side effects, alternatives, likelihood of achieving goals and potential problems during recovery for the procedure that I have provided informed consent.    Procedure:   Intrathecal pump refill    Physician/Practitioner performing the procedure:   Attending Physician: Emrik Erhard A. Laban Emperor, MD &  designated trained staff    Indication/Reason:   Chronic Pain Syndrome (G89.4), presence of an intrathecal pump (Z97.8)   Chronic Opioid Analgesic:   No other oral opioid analgesics prescribed by our practice. Intrathecal PF-Hydromorphone 3.9295 mg/day + PF-Sufentanil 34.383 mcg/day. MME: [facility]prox. 58.7 mg/day.   Medications ordered for procedure: Meds ordered this encounter  Medications   PAIN MANAGEMENT IT PUMP REFILL    Sig: 1 each by Intrathecal route once for 1 dose. Medication: 1st Opioid: PF-Hydromorphone 8 mg/mL 2nd Opioid: PF-Sufentanyl 70 mcg/mL Volume: 40 ml Appointment Date: 08/09/2023    Dispense:  1 each    Refill:  0    To be used as a refill for the intrathecal pumps by the pain management staff.   Medications administered: Andrey Farmer had no medications administered during this visit.  See the medical record for exact dosing, route, and time of administration.  Follow-up plan:   Return for Pump Refill (Max:75mo).       Interventional Therapies  Risk  Complexity Considerations:   Presence of intrathecal pump  DM  CAD  Pulmonary hypertension    Planned  Pending:   Diagnostic/therapeutic bilateral lumbar (L4-5, L5-S1) facet MBB L3R1  Palliative intrathecal pump refills    Under consideration:   Diagnostic left lumbar facet MBB #3  Possible left lumbar facet RFA #1   Completed:   Diagnostic/therapeutic right caudal ESI x1 (01/04/2022) (100/100/100/100) (of right leg pain)  Diagnostic left lumbar facet MBB x2 (12/22/2016) (07/14/2020) (100/100/100/100) (of left low back pain)    Therapeutic  Palliative (PRN) options:   Therapeutic/palliative left sided lumbar facet MBB #3       Recent Visits Date Type Provider Dept  08/08/23 Procedure visit Delano Metz, MD [facility]  06/01/23 Procedure visit Delano Metz, MD [facility]  05/30/23 Procedure visit Delano Metz, MD [facility]  Showing recent visits  within past 90 days and meeting all other requirements Today's Visits Date Type Provider Dept  08/09/23 Procedure visit Delano Metz, MD [facility]  Showing today's visits and meeting all other requirements Future Appointments Date Type Provider Dept  10/19/23 Appointment Delano Metz, MD [facility]  Showing future appointments within next 90 days and meeting all other requirements  Disposition: Discharge home  Discharge (Date  Time): 08/09/2023; 1030 hrs.   Primary Care Physician: Chauncy Lean, PA-C Location: [facility] Note by: Oswaldo Done, MD (TTS technology used. I apologize for any typographical errors that were not detected and corrected.) Date: 08/09/2023; Time: 10:40 AM  Disclaimer:  Medicine is not an Visual merchandiser. The only guarantee in medicine is that nothing is guaranteed. It is important to note that the decision to proceed with this intervention was based on the information collected from the patient. The Data and conclusions were drawn from the patient's questionnaire, the interview, and the physical examination. Because the information was provided in large part by the patient, it cannot be guaranteed that it has not been purposely or unconsciously manipulated. Every effort has been made to obtain as much relevant data as possible for this  evaluation. It is important to note that the conclusions that lead to this procedure are derived in large part from the available data. Always take into account that the treatment will also be dependent on availability of resources and existing treatment guidelines, considered by other Pain Management Practitioners as being common knowledge and practice, at the time of the intervention. For Medico-Legal purposes, it is also important to point out that variation in procedural techniques and pharmacological choices are the acceptable norm. The indications,  contraindications, technique, and results of the above procedure should only be interpreted and judged by a Board-Certified Interventional Pain Specialist with extensive familiarity and expertise in the same exact procedure and technique.

## 2023-08-10 ENCOUNTER — Telehealth: Payer: Self-pay

## 2023-08-10 NOTE — Telephone Encounter (Signed)
 No answer, left message to call if needed from IT pump refill.

## 2023-08-16 MED FILL — Medication: INTRATHECAL | Qty: 1 | Status: AC

## 2023-10-03 ENCOUNTER — Other Ambulatory Visit: Payer: Self-pay

## 2023-10-03 MED ORDER — PAIN MANAGEMENT IT PUMP REFILL: 1.0000 | Freq: Once | INTRATHECAL | 0 refills | Status: AC

## 2023-10-04 ENCOUNTER — Telehealth: Payer: Self-pay

## 2023-10-04 NOTE — Telephone Encounter (Signed)
Cyndi notified

## 2023-10-04 NOTE — Telephone Encounter (Signed)
 I changed her pump refill to 5/29 @ 1240 due to Dr. Philbert Brave vacations

## 2023-10-05 ENCOUNTER — Telehealth: Payer: Self-pay

## 2023-10-05 NOTE — Telephone Encounter (Signed)
 Left message to notify person responsible to change. VM on Media phone. Waiting for their response. Left note for myself to make sure message was received

## 2023-10-05 NOTE — Telephone Encounter (Signed)
 Called phasrm to see if message was changed per yesterdday message. Unable to talk with a person. Instructed to call us  back to notify

## 2023-10-11 NOTE — Progress Notes (Unsigned)
 PROVIDER NOTE: Interpretation of information contained herein should be left to medically-trained personnel. Specific patient instructions are provided elsewhere under "Patient Instructions" section of medical record. This document was created in part using STT-dictation technology, any transcriptional errors that may result from this process are unintentional.  Patient: Tiffany Hunter Type: Established DOB: 03-May-1946 MRN: [id number] PCP: Benn Brash, PA-C  Service: Procedure DOS: 10/12/2023 Setting: Ambulatory Location: Ambulatory outpatient facility Delivery: Face-to-face Provider: Candi Chafe, MD Specialty: Interventional Pain Management Specialty designation: 09 Location: Outpatient facility Ref. Prov.: Benn Brash, PA-C       Interventional Therapy   Primary Reason for Visit: Interventional Pain Management Treatment. CC: No chief complaint on file.  Procedure:          Type: Management of Intrathecal Drug Delivery System (IDDS) - Reservoir Refill (40981). No rate change.  Indications: 1. Chronic low back pain (1ry area of Pain) (Left) w/ sciatica (Left)   2. Chronic lower extremity pain (2ry area of Pain) (Left)   3. Chronic lower extremity radicular pain (S1) (Right)   4. Chronic lumbar radicular pain (L4 Dermatome) (Left)   5. DDD (degenerative disc disease), lumbosacral w/ LBP & LEP   6. 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 Lumbar facet joint pain   8. Lumbar facet joint syndrome (Left)   9. Lumbar facet arthropathy (Multilevel) (Bilateral)   10. Chronic pain syndrome   11. Pharmacologic therapy   12. 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 Encounter for adjustment and management of infusion pump   14. Encounter for interrogation of infusion pump    Pain Assessment: Self-Reported Pain Score:  /10             Reported level is compatible with observation.          Intrathecal Drug Delivery System  (IDDS)  Pump Device:  Manufacturer: Medtronic Model: Synchromed II Model No.: [id number] Serial No.: [id number] Delivery Route: Intrathecal Type: Programmable  Volume (mL): 40 mL reservoir Priming Volume: N/A  Calibration Constant: 114.0  MRI compatibility: Conditional   Implant Details:  Date: 07/07/2017 Implanter: Wandalee Gust, MD Contact Information: [facility] [phone] Last Revision/Replacement: 07/07/2017 Estimated Replacement Date: Nov/2025  Implant Site: Abdominal Laterality: Left  Catheter: Manufacturer: Medtronic Model:  Intrathecal catheter Model No.: 8731  Serial No.: n/a  Implanted Length (cm): 28.9  Catheter Volume (mL): 0.209  Tip Location (Level): T11 (Left) Canal Access Site: L2-3  Drug content:  Primary Medication Class: Opioid  Medication: PF-Hydromorphone (Dilaudid)  Concentration: 8 mg/mL   Secondary Medication Class: Opioid  Medication: PF-Sufentanil  Concentration: 70 mcg/mL   Tertiary Medication Class: none  Medication: none   PA parameters (PCA-mode):  Mode: Off (Inactive)  Programming:  Type: Simple continuous.  Medication, Concentration, Infusion Program, & Delivery Rate: For up-to-date details please see most recent scanned programming printout.   Intrathecal Pump Therapy Assessment  Manufacturer: Medtronic Synchromed Type: Programmable Volume: 40 mL reservoir MRI compatibility: Yes    Drug content:  Primary Medication Class: Opioid Primary Medication: PF-Hydromorphone (Dilaudid) (8 mg/mL)  Secondary Medication: PF-Sufentanil (70 g/mL)  Other Medication: No third    Programming:  Type: Simple continuous. See pump readout for details.     Changes:  Medication Change: None at this point Rate Change: No change in rate  Reported side-effects or adverse reactions: None reported  Effectiveness: Described as relatively effective, allowing for increase in activities of daily living (ADL) Clinically  meaningful improvement in function (CMIF):  Sustained CMIF goals met  Plan: Pump refill today   Pharmacotherapy Assessment  Opioid Analgesic: No other oral opioid analgesics prescribed by our practice. Intrathecal PF-Hydromorphone 3.9295 mg/day + PF-Sufentanil 34.383 mcg/day. MME: [employer]. 58.7 mg/day.    Monitoring: [place] PMP: PDMP reviewed during this encounter.       Pharmacotherapy: No side-effects or adverse reactions reported. Compliance: No problems identified. Effectiveness: Clinically acceptable. Plan: Refer to "POC".  UDS:  Summary  Date Value Ref Range Status  04/26/2023 FINAL  Final    Comment:    ==================================================================== ToxASSURE Select 13 (MW) ==================================================================== Specimen Alert ToxAssure, ToxAssure FLEX or MAT drug testing: -Technical component- Result certification performed at [facility], [address] [place], [place] [address]. [phone] Lab Director Pearlie Bougie, PhrmD. ==================================================================== Test                             Result       Flag       Units  Drug Present and Declared for Prescription Verification   Hydromorphone                  2187         EXPECTED   ng/mg creat    Hydromorphone may be administered as a scheduled prescription    medication; it is also an expected metabolite of hydrocodone.  Drug Absent but Declared for Prescription Verification   Sufentanil                     Not Detected UNEXPECTED ng/mg creat ==================================================================== Test                      Result    Flag   Units      Ref Range   Creatinine              83               mg/dL      >=69 ==================================================================== Declared Medications:  The flagging and interpretation on this report are based on the  following declared medications.   Unexpected results may arise from  inaccuracies in the declared medications.   **Note: The testing scope of this panel includes these medications:   Hydromorphone  Sufentanil   **Note: The testing scope of this panel does not include the  following reported medications:   Albuterol (Ventolin HFA)  Atorvastatin (Lipitor)  Carvedilol (Coreg)  Celecoxib (Celebrex)  Diethylpropion  Losartan (Cozaar)  Meloxicam (Mobic)  Naloxegol  (Movantik )  Naloxone  (Narcan )  Omeprazole (Prilosec)  Oxybutynin (Ditropan)  Sulfadiazine (SSD) ==================================================================== For clinical consultation, please call [phone]. ====================================================================    No results found for: "CBDTHCR", "D8THCCBX", "D9THCCBX"  H&P (Pre-op Assessment):  Tiffany Hunter is a 78 y.o. (year old), female patient, seen today for interventional treatment. She  has a past surgical history that includes Carpal tunnel release (Bilateral); Back surgery; Breast surgery (Bilateral); Cholecystectomy; Colonoscopy w/ polypectomy; Cataract extraction w/ intraocular lens implant (Right); Abdominal hysterectomy; Fracture surgery (Right); Cardiac catheterization (2012); and Pain pump revision (Left, 07/07/2017). Tiffany Hunter has a current medication list which includes the following prescription(s): albuterol, atorvastatin, carvedilol, celecoxib, diethylpropion hcl cr, losartan, meloxicam, naloxegol  oxalate, NON FORMULARY, omeprazole, oxybutynin, and ssd. Her primarily concern today is the No chief complaint on file.  Initial Vital Signs:  Pulse/HCG Rate:    Temp:   Resp:   BP:   SpO2:  BMI: Estimated body mass index is 27.98 kg/m as calculated from the following:   Height as of 08/09/23: 5\' 2"  (1.575 m).   Weight as of 08/09/23: 153 lb (69.4 kg).  Risk Assessment: Allergies: Reviewed. She is allergic to doxycycline hyclate and sulfa antibiotics.   Allergy Precautions: None required Coagulopathies: Reviewed. None identified.  Blood-thinner therapy: None at this time Active Infection(s): Reviewed. None identified. Tiffany Hunter is afebrile  Site Confirmation: Tiffany Hunter was asked to confirm the procedure and laterality before marking the site Procedure checklist: Completed Consent: Before the procedure and under the influence of no sedative(s), amnesic(s), or anxiolytics, the patient was informed of the treatment options, risks and possible complications. To fulfill our ethical and legal obligations, as recommended by the American Medical Association's Code of Ethics, I have informed the patient of my clinical impression; the nature and purpose of the treatment or procedure; the risks, benefits, and possible complications of the intervention; the alternatives, including doing nothing; the risk(s) and benefit(s) of the alternative treatment(s) or procedure(s); and the risk(s) and benefit(s) of doing nothing.  Tiffany Hunter was provided with information about the general risks and possible complications associated with most interventional procedures. These include, but are not limited to: failure to achieve desired goals, infection, bleeding, organ or nerve damage, allergic reactions, paralysis, and/or death.  In addition, she was informed of those risks and possible complications associated to this particular procedure, which include, but are not limited to: damage to the implant; failure to decrease pain; local, systemic, or serious CNS infections, intraspinal abscess with possible cord compression and paralysis, or life-threatening such as meningitis; bleeding; organ damage; nerve injury or damage with subsequent sensory, motor, and/or autonomic system dysfunction, resulting in transient or permanent pain, numbness, and/or weakness of one or several areas of the body; allergic reactions, either minor or major life-threatening, such as anaphylactic  or anaphylactoid reactions.  Furthermore, Tiffany Hunter was informed of those risks and complications associated with the medications. These include, but are not limited to: allergic reactions (i.e.: anaphylactic or anaphylactoid reactions); endorphine suppression; bradycardia and/or hypotension; water retention and/or peripheral vascular relaxation leading to lower extremity edema and possible stasis ulcers; respiratory depression and/or shortness of breath; decreased metabolic rate leading to weight gain; swelling or edema; medication-induced neural toxicity; particulate matter embolism and blood vessel occlusion with resultant organ, and/or nervous system infarction; and/or intrathecal granuloma formation with possible spinal cord compression and permanent paralysis.  Before refilling the pump Tiffany Hunter was informed that some of the medications used in the devise may not be FDA approved for such use and therefore it constitutes an off-label use of the medications.  Finally, she was informed that Medicine is not an exact science; therefore, there is also the possibility of unforeseen or unpredictable risks and/or possible complications that may result in a catastrophic outcome. The patient indicated having understood very clearly. We have given the patient no guarantees and we have made no promises. Enough time was given to the patient to ask questions, all of which were answered to the patient's satisfaction. Tiffany Hunter has indicated that she wanted to continue with the procedure. Attestation: I, the ordering provider, attest that I have discussed with the patient the benefits, risks, side-effects, alternatives, likelihood of achieving goals, and potential problems during recovery for the procedure that I have provided informed consent. Date  Time: {CHL [facility]-PAIN TIME CHOICES:[id number]}  Pre-Procedure Preparation:  Monitoring: As per clinic protocol. Respiration, ETCO2, SpO2, BP, heart rate and  rhythm  monitor placed and checked for adequate function Safety Precautions: Patient was assessed for positional comfort and pressure points before starting the procedure. Time-out: I initiated and conducted the "Time-out" before starting the procedure, as per protocol. The patient was asked to participate by confirming the accuracy of the "Time Out" information. Verification of the correct person, site, and procedure were performed and confirmed by me, the nursing staff, and the patient. "Time-out" conducted as per Joint Commission's Universal Protocol (UP.01.01.01). Time:   Start Time:   hrs.  Description of Procedure:          Position: Supine Target Area: Central-port of intrathecal pump. Approach: Anterior, 90 degree angle approach. Area Prepped: Entire Area around the pump implant. ChloraPrep (2% chlorhexidine  gluconate and 70% isopropyl alcohol) Safety Precautions: Aspiration looking for blood return was conducted prior to all injections. At no point did we inject any substances, as a needle was being advanced. No attempts were made at seeking any paresthesias. Safe injection practices and needle disposal techniques used. Medications properly checked for expiration dates. SDV (single dose vial) medications used. Description of the Procedure: Protocol guidelines were followed. Two nurses trained to do implant refills were present during the entire procedure. The refill medication was checked by both healthcare providers as well as the patient. The patient was included in the "Time-out" to verify the medication. The patient was placed in position. The pump was identified. The area was prepped in the usual manner. The sterile template was positioned over the pump, making sure the side-port location matched that of the pump. Both, the pump and the template were held for stability. The needle provided in the Medtronic Kit was then introduced thru the center of the template and into the central port.  The pump content was aspirated and discarded volume documented. The new medication was slowly infused into the pump, thru the filter, making sure to avoid overpressure of the device. The needle was then removed and the area cleansed, making sure to leave some of the prepping solution back to take advantage of its long term bactericidal properties. The pump was interrogated and programmed to reflect the correct medication, volume, and dosage. The program was printed and taken to the physician for approval. Once checked and signed by the physician, a copy was provided to the patient and another scanned into the EMR.  There were no vitals filed for this visit.  Start Time:   hrs. End Time:   hrs. Materials & Medications: Medtronic Refill Kit Medication(s): Please see chart orders for details.  Type of Imaging Technique: None used Indication(s): N/A Exposure Time: No patient exposure Contrast: None used. Fluoroscopic Guidance: N/A Ultrasound Guidance: N/A Interpretation: N/A  Antibiotic Prophylaxis:   Anti-infectives (From admission, onward)    None      Indication(s): None identified   Post-operative Assessment:  Post-procedure Vital Signs:  Pulse/HCG Rate:    Temp:   Resp:   BP:   SpO2:    EBL: None  Complications: No immediate post-treatment complications observed by team, or reported by patient.  Note: The patient tolerated the entire procedure well. A repeat set of vitals were taken after the procedure and the patient was kept under observation following institutional policy, for this type of procedure. Post-procedural neurological assessment was performed, showing return to baseline, prior to discharge. The patient was provided with post-procedure discharge instructions, including a section on how to identify potential problems. Should any problems arise concerning this procedure, the patient was given instructions to immediately  contact us , at any time, without hesitation.  In any case, we plan to contact the patient by telephone for a follow-up status report regarding this interventional procedure.  Comments:  No additional relevant information.  Plan of Care (POC)  Orders:  No orders of the defined types were placed in this encounter.   Chronic Opioid Analgesic:  No other oral opioid analgesics prescribed by our practice. Intrathecal PF-Hydromorphone 3.9295 mg/day + PF-Sufentanil 34.383 mcg/day. MME: [employer]. 58.7 mg/day.    Medications ordered for procedure: No orders of the defined types were placed in this encounter.  Medications administered: Carline Cheng had no medications administered during this visit.  See the medical record for exact dosing, route, and time of administration.  Follow-up plan:   No follow-ups on file.       Interventional Therapies  Risk  Complexity Considerations:   Presence of intrathecal pump  DM  CAD  Pulmonary hypertension    Planned  Pending:   Diagnostic/therapeutic bilateral lumbar (L4-5, L5-S1) facet MBB L3R1  Palliative intrathecal pump refills    Under consideration:   Diagnostic left lumbar facet MBB #3  Possible left lumbar facet RFA #1   Completed:   Diagnostic/therapeutic right caudal ESI x1 (01/04/2022) (100/100/100/100) (of right leg pain)  Diagnostic left lumbar facet MBB x2 (12/22/2016) (07/14/2020) (100/100/100/100) (of left low back pain)    Therapeutic  Palliative (PRN) options:   Therapeutic/palliative left sided lumbar facet MBB #3        Recent Visits Date Type Provider Dept  08/09/23 Procedure visit Renaldo Caroli, MD [facility]  Showing recent visits within past 90 days and meeting all other requirements Future Appointments Date Type Provider Dept  10/12/23 Appointment Renaldo Caroli, MD [facility]  Showing future appointments within next 90 days and meeting all other requirements  Disposition: Discharge home  Discharge (Date  Time): 10/12/2023;    hrs.   Primary Care Physician: Benn Brash, PA-C Location: [facility] Note by: Candi Chafe, MD (TTS technology used. I apologize for any typographical errors that were not detected and corrected.) Date: 10/12/2023; Time: 2:57 PM  Disclaimer:  Medicine is not an Visual merchandiser. The only guarantee in medicine is that nothing is guaranteed. It is important to note that the decision to proceed with this intervention was based on the information collected from the patient. The Data and conclusions were drawn from the patient's questionnaire, the interview, and the physical examination. Because the information was provided in large part by the patient, it cannot be guaranteed that it has not been purposely or unconsciously manipulated. Every effort has been made to obtain as much relevant data as possible for this evaluation. It is important to note that the conclusions that lead to this procedure are derived in large part from the available data. Always take into account that the treatment will also be dependent on availability of resources and existing treatment guidelines, considered by other Pain Management Practitioners as being common knowledge and practice, at the time of the intervention. For Medico-Legal purposes, it is also important to point out that variation in procedural techniques and pharmacological choices are the acceptable norm. The indications, contraindications, technique, and results of the above procedure should only be interpreted and judged by a Board-Certified Interventional Pain Specialist with extensive familiarity and expertise in the same exact procedure and technique.

## 2023-10-12 ENCOUNTER — Encounter: Payer: Self-pay | Admitting: Pain Medicine

## 2023-10-12 ENCOUNTER — Ambulatory Visit: Attending: Pain Medicine | Admitting: Pain Medicine

## 2023-10-12 VITALS — BP 158/64 | HR 79 | Temp 98.2°F | Resp 16 | Ht 62.5 in | Wt 155.0 lb

## 2023-10-12 DIAGNOSIS — M5459 Other low back pain: Secondary | ICD-10-CM | POA: Diagnosis present

## 2023-10-12 DIAGNOSIS — M5410 Radiculopathy, site unspecified: Secondary | ICD-10-CM | POA: Diagnosis present

## 2023-10-12 DIAGNOSIS — G894 Chronic pain syndrome: Secondary | ICD-10-CM | POA: Diagnosis present

## 2023-10-12 DIAGNOSIS — M51372 Other intervertebral disc degeneration, lumbosacral region with discogenic back pain and lower extremity pain: Secondary | ICD-10-CM | POA: Insufficient documentation

## 2023-10-12 DIAGNOSIS — M961 Postlaminectomy syndrome, not elsewhere classified: Secondary | ICD-10-CM | POA: Insufficient documentation

## 2023-10-12 DIAGNOSIS — M5416 Radiculopathy, lumbar region: Secondary | ICD-10-CM | POA: Insufficient documentation

## 2023-10-12 DIAGNOSIS — G8929 Other chronic pain: Secondary | ICD-10-CM | POA: Diagnosis present

## 2023-10-12 DIAGNOSIS — M79605 Pain in left leg: Secondary | ICD-10-CM | POA: Insufficient documentation

## 2023-10-12 DIAGNOSIS — Z79899 Other long term (current) drug therapy: Secondary | ICD-10-CM | POA: Insufficient documentation

## 2023-10-12 DIAGNOSIS — M5442 Lumbago with sciatica, left side: Secondary | ICD-10-CM | POA: Insufficient documentation

## 2023-10-12 DIAGNOSIS — Z978 Presence of other specified devices: Secondary | ICD-10-CM | POA: Insufficient documentation

## 2023-10-12 DIAGNOSIS — Z451 Encounter for adjustment and management of infusion pump: Secondary | ICD-10-CM | POA: Diagnosis present

## 2023-10-12 DIAGNOSIS — M47816 Spondylosis without myelopathy or radiculopathy, lumbar region: Secondary | ICD-10-CM | POA: Insufficient documentation

## 2023-10-12 DIAGNOSIS — Z79891 Long term (current) use of opiate analgesic: Secondary | ICD-10-CM | POA: Insufficient documentation

## 2023-10-12 MED ORDER — NALOXONE HCL 4 MG/0.1ML NA LIQD: 1.0000 | NASAL | 0 refills | Status: AC | PRN

## 2023-10-12 MED FILL — Medication: INTRATHECAL | Qty: 1 | Status: AC

## 2023-10-12 NOTE — Progress Notes (Signed)
 Safety precautions to be maintained throughout the outpatient stay will include: orient to surroundings, keep bed in low position, maintain call bell within reach at all times, provide assistance with transfer out of bed and ambulation.

## 2023-10-12 NOTE — Patient Instructions (Addendum)
 Let Dr. Marica Shoals know patient states she does not have Narcan  at home.   Opioid Overdose  Opioids are drugs that are often used to treat pain. Opioids include illegal drugs, such as heroin, as well as prescription pain medicines, such as codeine, morphine, hydrocodone, and fentanyl . An opioid overdose happens when you take too much of an opioid. An overdose may be intentional or accidental and can happen with any type of opioid. The effects of an overdose can be mild, dangerous, or even deadly. Opioid overdose is a medical emergency. What are the causes? This condition may be caused by: Taking too much of an opioid on purpose. Taking too much of an opioid by accident. Using two or more substances that contain opioids at the same time. Taking an opioid with a substance that affects your heart, breathing, or blood pressure. These include alcohol, tranquilizers, sleeping pills, illegal drugs, and some over-the-counter medicines. This condition may also happen due to an error made by: A health care provider who prescribes a medicine. The pharmacist who fills the prescription. What increases the risk? This condition is more likely in: Children. They may be attracted to colorful pills. Because of a child's small size, even a small amount of a medicine can be dangerous. Older people. They may be taking many different medicines. Older people may have difficulty reading labels or remembering when they last took their medicines. They may also be more sensitive to the effects of opioids. People with chronic medical conditions, especially heart, liver, kidney, or neurological diseases. People who take an opioid for a long period of time. People who take opioids and use illegal drugs, such as heroin, or other substances, such as alcohol. People who: Have a history of drug or alcohol abuse. Have certain mental health conditions. Have a history of previous drug overdoses. People who take opioids that  are not prescribed for them. What are the signs or symptoms? Symptoms of this condition depend on the type of opioid and the amount that was taken. Common symptoms include: Sleepiness or difficulty waking from sleep. Confusion. Slurred speech. Slowed breathing and a slow pulse (bradycardia). Nausea and vomiting. Abnormally small pupils. Signs and symptoms that require emergency treatment include: Cold, clammy, and pale skin. Blue lips and fingernails. Vomiting. Gurgling sounds in the throat. A pulse that is very slow or difficult to detect. Breathing that is very irregular, slow, noisy, or difficult to detect. Inability to respond to speech or be awakened from sleep (stupor). Seizures. How is this diagnosed? This condition is diagnosed based on your symptoms and medical history. It is important to tell your health care provider: About all of the opioids that you took. When you took the opioids. Whether you were drinking alcohol or using marijuana, cocaine, or other drugs. Your health care provider will do a physical exam. This exam may include: Checking and monitoring your heart rate and rhythm, breathing rate, temperature, and blood pressure. Measuring oxygen levels in your blood. Checking for abnormally small pupils. You may also have blood tests or urine tests. You may have X-rays if you are having severe breathing problems. How is this treated? This condition requires immediate medical treatment and hospitalization. Reversing the effects of the opioid is the first step in treatment. If you have a Narcan  kit or naloxone , use it right away. Follow your health care provider's instructions. A friend or family member can also help you with this. The rest of your treatment will be given in the hospital intensive care (  ICU). Treatment in the hospital may include: Giving salts and minerals (electrolytes) along with fluids through an IV. Inserting a breathing tube (endotracheal tube) in  your airway to help you breathe if you cannot breathe on your own or you are in danger of not being able to breathe on your own. Giving oxygen through a small tube under your nose. Passing a tube through your nose and into your stomach (nasogastric tube, or NG tube) to empty your stomach. Giving medicines that: Increase your blood pressure. Relieve nausea and vomiting. Relieve abdominal pain and cramping. Reverse the effects of the opioid (naloxone ). Monitoring your heart and oxygen levels. Ongoing counseling and mental health support if you intentionally overdosed or used an illegal drug. Follow these instructions at home:  Medicines Take over-the-counter and prescription medicines only as told by your health care provider. Always ask your health care provider about possible side effects and interactions of any new medicine that you start taking. Keep a list of all the medicines that you take, including over-the-counter medicines. Bring this list with you to all your medical visits. General instructions Drink enough fluid to keep your urine pale yellow. Keep all follow-up visits. This is important. How is this prevented? Read the drug inserts that come with your opioid pain medicines. Take medicines only as told by your health care provider. Do not take more medicine than you are told. Do not take medicines more frequently than you are told. Do not drink alcohol or take sedatives when taking opioids. Do not use illegal or recreational drugs, including cocaine, ecstasy, and marijuana. Do not take opioid medicines that are not prescribed for you. Store all medicines in safety containers that are out of the reach of children. Get help if you are struggling with: Alcohol or drug use. Depression or another mental health problem. Thoughts of hurting yourself or another person. Keep the phone number of your local poison control center near your phone or in your mobile phone. In the [place] the  hotline of the [facility] is (256) 429-9960. If you were prescribed naloxone , make sure you understand how to take it. Contact a health care provider if: You need help understanding how to take your pain medicines. You feel your medicines are too strong. You are concerned that your pain medicines are not working well for your pain. You develop new symptoms or side effects when you are taking medicines. Get help right away if: You or someone else is having symptoms of an opioid overdose. Get help even if you are not sure. You have thoughts about hurting yourself or others. You have: Chest pain. Difficulty breathing. A loss of consciousness. These symptoms may represent a serious problem that is an emergency. Do not wait to see if the symptoms will go away. Get medical help right away. Call your local emergency services (911 in the [place] Do not drive yourself to the hospital. If you ever feel like you may hurt yourself or others, or have thoughts about taking your own life, get help right away. You can go to your nearest emergency department or: Call your local emergency services (911 in the [place] Call a suicide crisis helpline, such as the National Suicide Prevention Lifeline at 307-128-0116 or 988 in the [place] This is open 24 hours a day in the [place] If you're a Veteran: Call 988 and press 1. This is open 24 hours a day. Text the [employer] at (915)534-6514. Summary Opioids are drugs that are  often used to treat pain. Opioids include illegal drugs, such as heroin, as well as prescription pain medicines. An opioid overdose happens when you take too much of an opioid. Overdoses can be intentional or accidental. Opioid overdose is very dangerous. It is a life-threatening emergency. If you or someone you know is experiencing an opioid overdose, get help right away. This information is not intended to replace advice given to you by your health care provider. Make sure  you discuss any questions you have with your health care provider. Document Revised: 02/06/2023 Document Reviewed: 08/12/2020 [employer] Patient Education  2024 [employer].

## 2023-10-13 ENCOUNTER — Telehealth: Payer: Self-pay | Admitting: *Deleted

## 2023-10-13 NOTE — Telephone Encounter (Signed)
 Post procedure call; son verbalizes that she is doing well.

## 2023-10-19 ENCOUNTER — Encounter: Admitting: Pain Medicine

## 2023-12-05 ENCOUNTER — Other Ambulatory Visit: Payer: Self-pay

## 2023-12-05 MED ORDER — PAIN MANAGEMENT IT PUMP REFILL: 1.0000 | Freq: Once | INTRATHECAL | 0 refills | Status: AC

## 2023-12-05 NOTE — Progress Notes (Signed)
 [name]

## 2023-12-19 ENCOUNTER — Ambulatory Visit: Attending: Pain Medicine | Admitting: Pain Medicine

## 2023-12-19 ENCOUNTER — Encounter: Payer: Self-pay | Admitting: Pain Medicine

## 2023-12-19 VITALS — BP 181/98 | HR 79 | Temp 97.9°F | Resp 16 | Ht 62.0 in | Wt 155.0 lb

## 2023-12-19 DIAGNOSIS — M5416 Radiculopathy, lumbar region: Secondary | ICD-10-CM | POA: Diagnosis present

## 2023-12-19 DIAGNOSIS — Z451 Encounter for adjustment and management of infusion pump: Secondary | ICD-10-CM | POA: Insufficient documentation

## 2023-12-19 DIAGNOSIS — M5459 Other low back pain: Secondary | ICD-10-CM | POA: Diagnosis present

## 2023-12-19 DIAGNOSIS — Z79899 Other long term (current) drug therapy: Secondary | ICD-10-CM | POA: Diagnosis present

## 2023-12-19 DIAGNOSIS — G8929 Other chronic pain: Secondary | ICD-10-CM | POA: Diagnosis not present

## 2023-12-19 DIAGNOSIS — M5442 Lumbago with sciatica, left side: Secondary | ICD-10-CM | POA: Insufficient documentation

## 2023-12-19 DIAGNOSIS — Z978 Presence of other specified devices: Secondary | ICD-10-CM | POA: Insufficient documentation

## 2023-12-19 DIAGNOSIS — M961 Postlaminectomy syndrome, not elsewhere classified: Secondary | ICD-10-CM | POA: Insufficient documentation

## 2023-12-19 DIAGNOSIS — Z79891 Long term (current) use of opiate analgesic: Secondary | ICD-10-CM | POA: Diagnosis present

## 2023-12-19 DIAGNOSIS — G894 Chronic pain syndrome: Secondary | ICD-10-CM | POA: Insufficient documentation

## 2023-12-19 DIAGNOSIS — M79605 Pain in left leg: Secondary | ICD-10-CM | POA: Diagnosis present

## 2023-12-19 DIAGNOSIS — M51372 Other intervertebral disc degeneration, lumbosacral region with discogenic back pain and lower extremity pain: Secondary | ICD-10-CM | POA: Diagnosis present

## 2023-12-19 DIAGNOSIS — Z462 Encounter for fitting and adjustment of other devices related to nervous system and special senses: Secondary | ICD-10-CM | POA: Diagnosis present

## 2023-12-19 DIAGNOSIS — M5410 Radiculopathy, site unspecified: Secondary | ICD-10-CM | POA: Insufficient documentation

## 2023-12-19 DIAGNOSIS — M47816 Spondylosis without myelopathy or radiculopathy, lumbar region: Secondary | ICD-10-CM | POA: Insufficient documentation

## 2023-12-19 NOTE — Progress Notes (Signed)
 Safety precautions to be maintained throughout the outpatient stay will include: orient to surroundings, keep bed in low position, maintain call bell within reach at all times, provide assistance with transfer out of bed and ambulation.

## 2023-12-19 NOTE — Progress Notes (Signed)
 PROVIDER NOTE: Interpretation of information contained herein should be left to medically-trained personnel. Specific patient instructions are provided elsewhere under Patient Instructions section of medical record. This document was created in part using STT-dictation technology, any transcriptional errors that may result from this process are unintentional.  Patient: Tiffany Hunter Type: Established DOB: 09-Jan-1946 MRN: [id number] PCP: Neita Dover, PA-C  Service: Procedure DOS: 12/19/2023 Setting: Ambulatory Location: Ambulatory outpatient facility Delivery: Face-to-face Provider: Eric DELENA Como, MD Specialty: Interventional Pain Management Specialty designation: 09 Location: Outpatient facility Ref. Prov.: Neita Dover, PA-C       Interventional Therapy   Primary Reason for Visit: Interventional Pain Management Treatment. CC: Back Pain  Procedure:          Type: Management of Intrathecal Drug Delivery System (IDDS) - Reservoir Refill (04009). No rate change.  Indications: 1. Chronic low back pain (1ry area of Pain) (Left) w/ sciatica (Left)   2. Chronic lower extremity pain (2ry area of Pain) (Left)   3. Chronic lower extremity radicular pain (S1) (Right)   4. Chronic lumbar radicular pain (L4 Dermatome) (Left)   5. DDD (degenerative disc disease), lumbosacral w/ LBP & LEP   6. 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 Lumbar facet joint pain   8. Lumbar facet joint syndrome (Left)   9. Lumbar facet arthropathy (Multilevel) (Bilateral)   10. Chronic pain syndrome   11. Pharmacologic therapy   12. 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 Encounter for adjustment and management of infusion pump   14. Encounter for interrogation of infusion pump   15. Chronic use of opiate for therapeutic purpose   16. End of battery life of intrathecal infusion pump    Pain Assessment: Self-Reported Pain Score: 7 /10              Reported level is compatible with observation.          Intrathecal Drug Delivery System (IDDS)  Pump Device:  Manufacturer: Medtronic Model: Synchromed II Model No.: [id number] Serial No.: [id number] H Delivery Route: Intrathecal Type: Programmable  Volume (mL): 40 mL reservoir Priming Volume: N/A  Calibration Constant: 114.0  MRI compatibility: Conditional   Implant Details:  Date: 07/07/2017 Implanter: Fairy Fleet, MD Contact Information: [facility] [phone] Last Revision/Replacement: 07/07/2017 Estimated Replacement Date: Nov/2025  Implant Site: Abdominal Laterality: Left  Catheter: Manufacturer: Medtronic Model:  Intrathecal catheter Model No.: 8731  Serial No.: n/a  Implanted Length (cm): 28.9  Catheter Volume (mL): 0.209  Tip Location (Level): T11 (Left) Canal Access Site: L2-3  Drug content:  Primary Medication Class: Opioid  Medication: PF-Hydromorphone (Dilaudid)  Concentration: 8 mg/mL   Secondary Medication Class: Opioid  Medication: PF-Sufentanil  Concentration: 70 mcg/mL   Tertiary Medication Class: none  Medication: none   PA parameters (PCA-mode):  Mode: Off (Inactive)  Programming:  Type: Simple continuous.  Medication, Concentration, Infusion Program, & Delivery Rate: For up-to-date details please see most recent scanned programming printout.   Intrathecal Pump Therapy Assessment  Manufacturer: Medtronic Synchromed Type: Programmable Volume: 40 mL reservoir MRI compatibility: Yes    Drug content:  Primary Medication Class: Opioid Primary Medication: PF-Hydromorphone (Dilaudid) (8 mg/mL)  Secondary Medication: PF-Sufentanil (70 g/mL)  Other Medication: No third    Programming:  Type: Simple continuous. See pump readout for details.     Changes:  Medication Change: None at this point Rate Change: No change in rate  Reported side-effects or adverse reactions: None reported  Effectiveness: Described as  relatively effective, allowing for increase in activities of daily living (ADL) Clinically meaningful improvement in function (CMIF): Sustained CMIF goals met  Plan: Pump refill today   Pharmacotherapy Assessment  Opioid Analgesic: No other oral opioid analgesics prescribed by our practice. Intrathecal PF-Hydromorphone 3.9295 mg/day + PF-Sufentanil 34.383 mcg/day. MME: Aprox. 58.7 mg/day.     Monitoring: [place] PMP: PDMP reviewed during this encounter.       Pharmacotherapy: No side-effects or adverse reactions reported. Compliance: No problems identified. Effectiveness: Clinically acceptable. Plan: Refer to POC.  UDS:  Summary  Date Value Ref Range Status  04/26/2023 FINAL  Final    Comment:    ==================================================================== ToxASSURE Select 13 (MW) ==================================================================== Specimen Alert ToxAssure, ToxAssure FLEX or MAT drug testing: -Technical component- Result certification performed at [facility], [address] [place], [place] [address]. [phone] Lab Director Arnulfo Finder, PhrmD. ==================================================================== Test                             Result       Flag       Units  Drug Present and Declared for Prescription Verification   Hydromorphone                  2187         EXPECTED   ng/mg creat    Hydromorphone may be administered as a scheduled prescription    medication; it is also an expected metabolite of hydrocodone.  Drug Absent but Declared for Prescription Verification   Sufentanil                     Not Detected UNEXPECTED ng/mg creat ==================================================================== Test                      Result    Flag   Units      Ref Range   Creatinine              83               mg/dL      >=79 ==================================================================== Declared Medications:  The  flagging and interpretation on this report are based on the  following declared medications.  Unexpected results may arise from  inaccuracies in the declared medications.   **Note: The testing scope of this panel includes these medications:   Hydromorphone  Sufentanil   **Note: The testing scope of this panel does not include the  following reported medications:   Albuterol (Ventolin HFA)  Atorvastatin (Lipitor)  Carvedilol (Coreg)  Celecoxib (Celebrex)  Diethylpropion  Losartan (Cozaar)  Meloxicam (Mobic)  Naloxegol  (Movantik )  Naloxone  (Narcan )  Omeprazole (Prilosec)  Oxybutynin (Ditropan)  Sulfadiazine (SSD) ==================================================================== For clinical consultation, please call [phone]. ====================================================================    No results found for: CBDTHCR, D8THCCBX, D9THCCBX  H&P (Pre-op Assessment):  Tiffany Hunter is a 78 y.o. (year old), female patient, seen today for interventional treatment. She  has a past surgical history that includes Carpal tunnel release (Bilateral); Back surgery; Breast surgery (Bilateral); Cholecystectomy; Colonoscopy w/ polypectomy; Cataract extraction w/ intraocular lens implant (Right); Abdominal hysterectomy; Fracture surgery (Right); Cardiac catheterization (2012); and Pain pump revision (Left, 07/07/2017). Tiffany Hunter has a current medication list which includes the following prescription(s): albuterol, atorvastatin, carvedilol, celecoxib, diethylpropion hcl cr, losartan, meloxicam, naloxegol  oxalate, naloxone , NON FORMULARY, omeprazole, oxybutynin, and ssd. Her primarily concern today is the Back Pain  Initial Vital Signs:  Pulse/HCG Rate: 79  Temp: 97.9 F (36.6 C) Resp: 16 BP: (!) 181/98 SpO2: 98 %  BMI: Estimated body mass index is 28.35 kg/m as calculated from the following:   Height as of this encounter: 5' 2 (1.575 m).   Weight as of this  encounter: 155 lb (70.3 kg).  Risk Assessment: Allergies: Reviewed. She is allergic to penicillin g, doxycycline hyclate, and sulfa antibiotics.  Allergy Precautions: None required Coagulopathies: Reviewed. None identified.  Blood-thinner therapy: None at this time Active Infection(s): Reviewed. None identified. Tiffany Hunter is afebrile  Site Confirmation: Tiffany Hunter was asked to confirm the procedure and laterality before marking the site Procedure checklist: Completed Consent: Before the procedure and under the influence of no sedative(s), amnesic(s), or anxiolytics, the patient was informed of the treatment options, risks and possible complications. To fulfill our ethical and legal obligations, as recommended by the American Medical Association's Code of Ethics, I have informed the patient of my clinical impression; the nature and purpose of the treatment or procedure; the risks, benefits, and possible complications of the intervention; the alternatives, including doing nothing; the risk(s) and benefit(s) of the alternative treatment(s) or procedure(s); and the risk(s) and benefit(s) of doing nothing.  Tiffany Hunter was provided with information about the general risks and possible complications associated with most interventional procedures. These include, but are not limited to: failure to achieve desired goals, infection, bleeding, organ or nerve damage, allergic reactions, paralysis, and/or death.  In addition, she was informed of those risks and possible complications associated to this particular procedure, which include, but are not limited to: damage to the implant; failure to decrease pain; local, systemic, or serious CNS infections, intraspinal abscess with possible cord compression and paralysis, or life-threatening such as meningitis; bleeding; organ damage; nerve injury or damage with subsequent sensory, motor, and/or autonomic system dysfunction, resulting in transient or permanent  pain, numbness, and/or weakness of one or several areas of the body; allergic reactions, either minor or major life-threatening, such as anaphylactic or anaphylactoid reactions.  Furthermore, Tiffany Hunter was informed of those risks and complications associated with the medications. These include, but are not limited to: allergic reactions (i.e.: anaphylactic or anaphylactoid reactions); endorphine suppression; bradycardia and/or hypotension; water retention and/or peripheral vascular relaxation leading to lower extremity edema and possible stasis ulcers; respiratory depression and/or shortness of breath; decreased metabolic rate leading to weight gain; swelling or edema; medication-induced neural toxicity; particulate matter embolism and blood vessel occlusion with resultant organ, and/or nervous system infarction; and/or intrathecal granuloma formation with possible spinal cord compression and permanent paralysis.  Before refilling the pump Tiffany Hunter was informed that some of the medications used in the devise may not be FDA approved for such use and therefore it constitutes an off-label use of the medications.  Finally, she was informed that Medicine is not an exact science; therefore, there is also the possibility of unforeseen or unpredictable risks and/or possible complications that may result in a catastrophic outcome. The patient indicated having understood very clearly. We have given the patient no guarantees and we have made no promises. Enough time was given to the patient to ask questions, all of which were answered to the patient's satisfaction. Tiffany Hunter has indicated that she wanted to continue with the procedure. Attestation: I, the ordering provider, attest that I have discussed with the patient the benefits, risks, side-effects, alternatives, likelihood of achieving goals, and potential problems during recovery for the procedure that I have provided informed consent. Date  Time:  12/19/2023  2:12 PM  Pre-Procedure Preparation:  Monitoring: As per clinic protocol. Respiration, ETCO2, SpO2, BP, heart rate and rhythm monitor placed and checked for adequate function Safety Precautions: Patient was assessed for positional comfort and pressure points before starting the procedure. Time-out: I initiated and conducted the Time-out before starting the procedure, as per protocol. The patient was asked to participate by confirming the accuracy of the Time Out information. Verification of the correct person, site, and procedure were performed and confirmed by me, the nursing staff, and the patient. Time-out conducted as per Joint Commission's Universal Protocol (UP.01.01.01). Time: 1422 Start Time: 1423 hrs.  Description of Procedure:          Position: Supine Target Area: Central-port of intrathecal pump. Approach: Anterior, 90 degree angle approach. Area Prepped: Entire Area around the pump implant. ChloraPrep (2% chlorhexidine  gluconate and 70% isopropyl alcohol) Safety Precautions: Aspiration looking for blood return was conducted prior to all injections. At no point did we inject any substances, as a needle was being advanced. No attempts were made at seeking any paresthesias. Safe injection practices and needle disposal techniques used. Medications properly checked for expiration dates. SDV (single dose vial) medications used. Description of the Procedure: Protocol guidelines were followed. Two nurses trained to do implant refills were present during the entire procedure. The refill medication was checked by both healthcare providers as well as the patient. The patient was included in the Time-out to verify the medication. The patient was placed in position. The pump was identified. The area was prepped in the usual manner. The sterile template was positioned over the pump, making sure the side-port location matched that of the pump. Both, the pump and the template were held  for stability. The needle provided in the Medtronic Kit was then introduced thru the center of the template and into the central port. The pump content was aspirated and discarded volume documented. The new medication was slowly infused into the pump, thru the filter, making sure to avoid overpressure of the device. The needle was then removed and the area cleansed, making sure to leave some of the prepping solution back to take advantage of its long term bactericidal properties. The pump was interrogated and programmed to reflect the correct medication, volume, and dosage. The program was printed and taken to the physician for approval. Once checked and signed by the physician, a copy was provided to the patient and another scanned into the EMR.  Vitals:   12/19/23 1411  BP: (!) 181/98  Pulse: 79  Resp: 16  Temp: 97.9 F (36.6 C)  SpO2: 98%  Weight: 155 lb (70.3 kg)  Height: 5' 2 (1.575 m)    Start Time: 1423 hrs. End Time: 1429 hrs. Materials & Medications: Medtronic Refill Kit Medication(s): Please see chart orders for details.  Type of Imaging Technique: None used Indication(s): N/A Exposure Time: No patient exposure Contrast: None used. Fluoroscopic Guidance: N/A Ultrasound Guidance: N/A Interpretation: N/A  Antibiotic Prophylaxis:   Anti-infectives (From admission, onward)    None      Indication(s): None identified  Post-operative Assessment:  Post-procedure Vital Signs:  Pulse/HCG Rate: 79  Temp: 97.9 F (36.6 C) Resp: 16 BP: (!) 181/98 SpO2: 98 %  EBL: None  Complications: No immediate post-treatment complications observed by team, or reported by patient.  Note: The patient tolerated the entire procedure well. A repeat set of vitals were taken after the procedure and the patient was kept under observation following institutional policy, for this  type of procedure. Post-procedural neurological assessment was performed, showing return to baseline, prior to  discharge. The patient was provided with post-procedure discharge instructions, including a section on how to identify potential problems. Should any problems arise concerning this procedure, the patient was given instructions to immediately contact us , at any time, without hesitation. In any case, we plan to contact the patient by telephone for a follow-up status report regarding this interventional procedure.  Comments:  No additional relevant information.  Plan of Care (POC)  Orders:  Orders Placed This Encounter  Procedures   PUMP REFILL    Maintain Protocol by having two(2) healthcare providers during procedure and programming.    Scheduling Instructions:     Please refill intrathecal pump today. (12/19/2023)    Where will this procedure be performed?:   [facility]   PUMP REFILL    Whenever possible schedule on a procedure today.    Standing Status:   Future    Expiration Date:   04/19/2024    Scheduling Instructions:     Please schedule intrathecal pump refill based on pump programming. Avoid schedule intervals of more than 120 days (4 months).    Where will this procedure be performed?:   [facility]   Intrathecal Pump, Reservoir revision and possible replacement    Standing Status:   Future    Expiration Date:   03/20/2024    Scheduling Instructions:     Please schedule this patient for an intrathecal pump revision and possible replacement.   Informed Consent Details: Physician/Practitioner Attestation; Transcribe to consent form and obtain patient signature    Transcribe to consent form and obtain patient signature.    Physician/Practitioner attestation of informed consent for procedure/surgical case:   I, the physician/practitioner, attest that I have discussed with the patient the benefits, risks, side effects, alternatives, likelihood of achieving goals and potential problems during recovery for the procedure that I have provided informed consent.    Procedure:    Intrathecal pump refill    Physician/Practitioner performing the procedure:   Attending Physician: Londen Bok A. Tanya, MD & designated trained staff    Indication/Reason:   Chronic Pain Syndrome (G89.4), presence of an intrathecal pump (Z97.8)     Opioid Analgesic: No other oral opioid analgesics prescribed by our practice. Intrathecal PF-Hydromorphone 3.9295 mg/day + PF-Sufentanil 34.383 mcg/day. MME: Aprox. 58.7 mg/day.     Medications ordered for procedure: No orders of the defined types were placed in this encounter.  Medications administered: Tiffany Hunter had no medications administered during this visit.  See the medical record for exact dosing, route, and time of administration.    Interventional Therapies  Risk  Complexity Considerations:   Presence of intrathecal pump  DM  CAD  Pulmonary hypertension     Planned  Pending:      Under consideration:   Diagnostic/therapeutic bilateral lumbar (L4-5, L5-S1) facet MBB L3R1  Diagnostic left lumbar facet MBB #3  Possible left lumbar facet RFA #1   Completed:   Diagnostic/therapeutic right caudal ESI x1 (01/04/2022) (100/100/100/100) (of right leg pain)  Diagnostic left lumbar facet MBB x2 (12/22/2016) (07/14/2020) (100/100/100/100) (of left low back pain)    Therapeutic  Palliative (PRN) options:   Palliative intrathecal pump refills  Therapeutic/palliative left sided lumbar facet MBB #3        Follow-up plan:   Return for Intrathecal pump replacement.     Recent Visits Date Type Provider Dept  10/12/23 Procedure visit Tanya Glisson, MD [facility]  Showing recent visits within past 90 days and meeting all other requirements Today's Visits Date Type Provider Dept  12/19/23 Procedure visit Tanya Glisson, MD [facility]  Showing today's visits and meeting all other requirements Future Appointments Date Type Provider Dept  01/29/24 Appointment Tanya Glisson, MD [facility]  02/21/24 Appointment Tanya Glisson, MD [facility]  Showing future appointments within next 90 days and meeting all other requirements   Disposition: Discharge home  Discharge (Date  Time): 12/19/2023; 1429 hrs.   Primary Care Physician: Neita Dover, PA-C Location: [facility] Note by: Glisson DELENA Tanya, MD (TTS technology used. I apologize for any typographical errors that were not detected and corrected.) Date: 12/19/2023; Time: 3:28 PM  Disclaimer:  Medicine is not an Visual merchandiser. The only guarantee in medicine is that nothing is guaranteed. It is important to note that the decision to proceed with this intervention was based on the information collected from the patient. The Data and conclusions were drawn from the patient's questionnaire, the interview, and the physical examination. Because the information was provided in large part by the patient, it cannot be guaranteed that it has not been purposely or unconsciously manipulated. Every effort has been made to obtain as much relevant data as possible for this evaluation. It is important to note that the conclusions that lead to this procedure are derived in large part from the available data. Always take into account that the treatment will also be dependent on availability of resources and existing treatment guidelines, considered by other Pain Management Practitioners as being common knowledge and practice, at the time of the intervention. For Medico-Legal purposes, it is also important to point out that variation in procedural techniques and pharmacological choices are the acceptable norm. The indications, contraindications, technique, and results of the above procedure should only be interpreted and judged by a Board-Certified Interventional Pain Specialist with extensive familiarity and expertise in the same exact procedure and technique.

## 2023-12-19 NOTE — Patient Instructions (Signed)
 Patient has Narcan  at home  Opioid Overdose Opioids are drugs that are often used to treat pain. Opioids include illegal drugs, such as heroin, as well as prescription pain medicines, such as codeine, morphine, hydrocodone, and fentanyl . An opioid overdose happens when you take too much of an opioid. An overdose may be intentional or accidental and can happen with any type of opioid. The effects of an overdose can be mild, dangerous, or even deadly. Opioid overdose is a medical emergency. What are the causes? This condition may be caused by: Taking too much of an opioid on purpose. Taking too much of an opioid by accident. Using two or more substances that contain opioids at the same time. Taking an opioid with a substance that affects your heart, breathing, or blood pressure. These include alcohol, tranquilizers, sleeping pills, illegal drugs, and some over-the-counter medicines. This condition may also happen due to an error made by: A health care provider who prescribes a medicine. The pharmacist who fills the prescription. What increases the risk? This condition is more likely in: Children. They may be attracted to colorful pills. Because of a child's small size, even a small amount of a medicine can be dangerous. Older people. They may be taking many different medicines. Older people may have difficulty reading labels or remembering when they last took their medicines. They may also be more sensitive to the effects of opioids. People with chronic medical conditions, especially heart, liver, kidney, or neurological diseases. People who take an opioid for a long period of time. People who take opioids and use illegal drugs, such as heroin, or other substances, such as alcohol. People who: Have a history of drug or alcohol abuse. Have certain mental health conditions. Have a history of previous drug overdoses. People who take opioids that are not prescribed for them. What are the signs  or symptoms? Symptoms of this condition depend on the type of opioid and the amount that was taken. Common symptoms include: Sleepiness or difficulty waking from sleep. Confusion. Slurred speech. Slowed breathing and a slow pulse (bradycardia). Nausea and vomiting. Abnormally small pupils. Signs and symptoms that require emergency treatment include: Cold, clammy, and pale skin. Blue lips and fingernails. Vomiting. Gurgling sounds in the throat. A pulse that is very slow or difficult to detect. Breathing that is very irregular, slow, noisy, or difficult to detect. Inability to respond to speech or be awakened from sleep (stupor). Seizures. How is this diagnosed? This condition is diagnosed based on your symptoms and medical history. It is important to tell your health care provider: About all of the opioids that you took. When you took the opioids. Whether you were drinking alcohol or using marijuana, cocaine, or other drugs. Your health care provider will do a physical exam. This exam may include: Checking and monitoring your heart rate and rhythm, breathing rate, temperature, and blood pressure. Measuring oxygen levels in your blood. Checking for abnormally small pupils. You may also have blood tests or urine tests. You may have X-rays if you are having severe breathing problems. How is this treated? This condition requires immediate medical treatment and hospitalization. Reversing the effects of the opioid is the first step in treatment. If you have a Narcan  kit or naloxone , use it right away. Follow your health care provider's instructions. A friend or family member can also help you with this. The rest of your treatment will be given in the hospital intensive care (ICU). Treatment in the hospital may include: Giving salts and  minerals (electrolytes) along with fluids through an IV. Inserting a breathing tube (endotracheal tube) in your airway to help you breathe if you cannot  breathe on your own or you are in danger of not being able to breathe on your own. Giving oxygen through a small tube under your nose. Passing a tube through your nose and into your stomach (nasogastric tube, or NG tube) to empty your stomach. Giving medicines that: Increase your blood pressure. Relieve nausea and vomiting. Relieve abdominal pain and cramping. Reverse the effects of the opioid (naloxone ). Monitoring your heart and oxygen levels. Ongoing counseling and mental health support if you intentionally overdosed or used an illegal drug. Follow these instructions at home:  Medicines Take over-the-counter and prescription medicines only as told by your health care provider. Always ask your health care provider about possible side effects and interactions of any new medicine that you start taking. Keep a list of all the medicines that you take, including over-the-counter medicines. Bring this list with you to all your medical visits. General instructions Drink enough fluid to keep your urine pale yellow. Keep all follow-up visits. This is important. How is this prevented? Read the drug inserts that come with your opioid pain medicines. Take medicines only as told by your health care provider. Do not take more medicine than you are told. Do not take medicines more frequently than you are told. Do not drink alcohol or take sedatives when taking opioids. Do not use illegal or recreational drugs, including cocaine, ecstasy, and marijuana. Do not take opioid medicines that are not prescribed for you. Store all medicines in safety containers that are out of the reach of children. Get help if you are struggling with: Alcohol or drug use. Depression or another mental health problem. Thoughts of hurting yourself or another person. Keep the phone number of your local poison control center near your phone or in your mobile phone. In the [place] the hotline of the [facility]  is (408) 340-2370. If you were prescribed naloxone , make sure you understand how to take it. Contact a health care provider if: You need help understanding how to take your pain medicines. You feel your medicines are too strong. You are concerned that your pain medicines are not working well for your pain. You develop new symptoms or side effects when you are taking medicines. Get help right away if: You or someone else is having symptoms of an opioid overdose. Get help even if you are not sure. You have thoughts about hurting yourself or others. You have: Chest pain. Difficulty breathing. A loss of consciousness. These symptoms may represent a serious problem that is an emergency. Do not wait to see if the symptoms will go away. Get medical help right away. Call your local emergency services (911 in the [place] Do not drive yourself to the hospital. If you ever feel like you may hurt yourself or others, or have thoughts about taking your own life, get help right away. You can go to your nearest emergency department or: Call your local emergency services (911 in the [place] Call a suicide crisis helpline, such as the National Suicide Prevention Lifeline at (413) 715-6253 or 988 in the [place] This is open 24 hours a day in the [place] If you're a Veteran: Call 988 and press 1. This is open 24 hours a day. Text the [employer] at 639 168 5842. Summary Opioids are drugs that are often used to treat pain. Opioids include illegal drugs, such  as heroin, as well as prescription pain medicines. An opioid overdose happens when you take too much of an opioid. Overdoses can be intentional or accidental. Opioid overdose is very dangerous. It is a life-threatening emergency. If you or someone you know is experiencing an opioid overdose, get help right away. This information is not intended to replace advice given to you by your health care provider. Make sure you discuss any questions you have with your  health care provider. Document Revised: 02/06/2023 Document Reviewed: 08/12/2020 [employer] Patient Education  2024 [employer].

## 2023-12-25 ENCOUNTER — Telehealth: Payer: Self-pay

## 2023-12-25 NOTE — Telephone Encounter (Signed)
 The OR person who reviews the utilzation for surgery. CPT code you entered [phone] is for removal only. Cpt code 37639 is for actual replacement. Can you change the code in your order? Then Orie will have to call scheduling to change it.

## 2023-12-26 MED FILL — Medication: INTRATHECAL | Qty: 1 | Status: AC

## 2024-01-24 ENCOUNTER — Other Ambulatory Visit: Payer: Self-pay

## 2024-01-24 MED ORDER — PAIN MANAGEMENT IT PUMP REFILL: 1.0000 | Freq: Once | INTRATHECAL | 0 refills | Status: AC

## 2024-01-28 NOTE — Progress Notes (Signed)
 PROVIDER NOTE: Interpretation of information contained herein should be left to medically-trained personnel. Specific patient instructions are provided elsewhere under Patient Instructions section of medical record. This document was created in part using AI and STT-dictation technology, any transcriptional errors that may result from this process are unintentional.  Patient: Tiffany Hunter  Service: E/M   PCP: Neita Dover, PA-C  DOB: [birth year]  DOS: 01/29/2024  Provider: Eric DELENA Como, MD  MRN: [id number]  Delivery: Face-to-face  Specialty: Interventional Pain Management  Type: Established Patient  Setting: Ambulatory outpatient facility  Specialty designation: 09  Referring Prov.: Neita Dover, PA-C  Location: Outpatient office facility       History of present illness (HPI) [name], a 78 y.o. year old female, is here today because of her Chronic left-sided low back pain with left-sided sciatica [M54.42, G89.29]. 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); Chronic lumbar radicular pain (L4 Dermatome) (Left); Coccygodynia; Chronic hip pain (Left); Osteoarthritis of hip (Left); Chronic knee pain (Left); Osteoarthritis of knee (Left); Thoracic bulging disc without myelopathy; Generalized weakness; Chronic lower extremity pain (2ry area of Pain) (Left); Chronic sacroiliac joint pain (3ry area of Pain) (Left); Generalized abdominal pain; Right elbow pain; Lumbar facet joint syndrome (Left); End of battery life of intrathecal infusion pump; Spondylosis  without myelopathy or radiculopathy, lumbosacral region; Chronic low back pain (Left) w/o sciatica; Osteoarthritis of lumbar spine; Hyperalgesia; Shoulder arthritis; H/O total shoulder replacement, left; Spondylosis of lumbar spine; Pancreatic mass; Chronic lower extremity pain (Right); Chronic low back pain (Bilateral) w/ sciatica (Right); Chronic lower extremity radicular pain (S1) (Right); Chronic low back pain (Bilateral) w/o sciatica; Lumbar facet joint pain; Lumbar facet arthropathy (Multilevel) (Bilateral); DDD (degenerative disc disease), lumbosacral w/ LBP & LEP; Encounter for adjustment and management of infusion pump; Encounter for interrogation of infusion pump; and Acute pain of right shoulder on their pertinent problem list.  Pain Assessment: Severity of Chronic pain is reported as a 9 /10. Location: Shoulder Right/radiates down right arm to fingertips. Onset: More than a month ago. Quality: Sharp. Timing: Constant. Modifying factor(s): shots. Vitals:  height is 5' 2 (1.575 m) and weight is 155 lb (70.3 kg). Her temperature is 97.2 F (36.2 C) (abnormal). Her blood pressure is 160/69 (abnormal) and her pulse is 76. Her oxygen saturation is 99%.  BMI: Estimated body mass index is 28.35 kg/m as calculated from the following:   Height as of this encounter: 5' 2 (1.575 m).   Weight as of this encounter: 155 lb (70.3 kg).  Last encounter: 04/26/2023. Last procedure: 12/19/2023.  Reason for encounter: Preoperative evaluation for possible interventional PM therapy/treatment.   Discussed the use of AI scribe software for clinical note transcription with the patient, who gave verbal consent to proceed.  History of Present Illness   Tiffany Hunter is a 78 year old female who presents for pre-operative evaluation prior to pump replacement surgery.  She experiences severe shoulder pain. She is seeking relief from this pain.      Pharmacotherapy Assessment   Opioid Analgesic: No other oral  opioid analgesics prescribed by our practice. Intrathecal PF-Hydromorphone 3.9295 mg/day + PF-Sufentanil 34.383 mcg/day. MME: [facility]. 58.7  mg/day.     Monitoring: [place] PMP: PDMP reviewed during this encounter.       Pharmacotherapy: No side-effects or adverse reactions reported. Compliance: No problems identified. Effectiveness: Clinically acceptable.  Tiffany Nathanel PARAS, RN  01/29/2024  1:28 PM  Sign when Signing Visit Safety precautions to be maintained throughout the outpatient stay will include: orient to surroundings, keep bed in low position, maintain call bell within reach at all times, provide assistance with transfer out of bed and ambulation.     UDS:  Summary  Date Value Ref Range Status  04/26/2023 FINAL  Final    Comment:    ==================================================================== ToxASSURE Select 13 (MW) ==================================================================== Specimen Alert ToxAssure, ToxAssure FLEX or MAT drug testing: -Technical component- Result certification performed at [facility], [address] [place], [place] [address]. [phone] Lab Director Arnulfo Finder, PhrmD. ==================================================================== Test                             Result       Flag       Units  Drug Present and Declared for Prescription Verification   Hydromorphone                  2187         EXPECTED   ng/mg creat    Hydromorphone may be administered as a scheduled prescription    medication; it is also an expected metabolite of hydrocodone.  Drug Absent but Declared for Prescription Verification   Sufentanil                     Not Detected UNEXPECTED ng/mg creat ==================================================================== Test                      Result    Flag   Units      Ref Range   Creatinine              83               mg/dL       >=79 ==================================================================== Declared Medications:  The flagging and interpretation on this report are based on the  following declared medications.  Unexpected results may arise from  inaccuracies in the declared medications.   **Note: The testing scope of this panel includes these medications:   Hydromorphone  Sufentanil   **Note: The testing scope of this panel does not include the  following reported medications:   Albuterol (Ventolin HFA)  Atorvastatin (Lipitor)  Carvedilol (Coreg)  Celecoxib (Celebrex)  Diethylpropion  Losartan (Cozaar)  Meloxicam (Mobic)  Naloxegol  (Movantik )  Naloxone  (Narcan )  Omeprazole (Prilosec)  Oxybutynin (Ditropan)  Sulfadiazine (SSD) ==================================================================== For clinical consultation, please call [phone]. ====================================================================     No results found for: CBDTHCR No results found for: D8THCCBX No results found for: D9THCCBX  ROS  Constitutional: Denies any fever or chills Gastrointestinal: No reported hemesis, hematochezia, vomiting, or acute GI distress Musculoskeletal: Denies any acute onset joint swelling, redness, loss of ROM, or weakness Neurological: No reported episodes of acute onset apraxia, aphasia, dysarthria, agnosia, amnesia, paralysis, loss of coordination, or loss of consciousness  Medication Review  Diethylpropion HCl CR, NON FORMULARY, albuterol, atorvastatin, carvedilol, celecoxib, losartan, meloxicam, naloxegol  oxalate, naloxone , omeprazole, oxybutynin, and silver sulfADIAZINE  History Review  Allergy: Tiffany Hunter is allergic to penicillin g, doxycycline hyclate, and sulfa antibiotics. Drug: Tiffany Hunter  reports no history of  drug use. Alcohol:  reports no history of alcohol use. Tobacco:  reports that she has never smoked. She has never used smokeless tobacco. Social:  Tiffany Hunter  reports that she has never smoked. She has never used smokeless tobacco. She reports that she does not drink alcohol and does not use drugs. 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 Surgical: Tiffany Hunter  has a past surgical history that includes Carpal tunnel release (Bilateral); Back surgery; Breast surgery (Bilateral); Cholecystectomy; Colonoscopy w/ polypectomy; Cataract extraction w/ intraocular lens implant (Right); Abdominal hysterectomy; Fracture surgery (Right); Cardiac catheterization (2012); and Pain pump revision (Left, 07/07/2017). Family: family history includes Cancer in her mother; Colon cancer in an other family member; Coronary artery disease in an other family member; Heart disease in her father.  Laboratory Chemistry Profile   Renal Lab Results  Component Value Date   BUN 8 07/30/2019   CREATININE 0.56 (L) 07/30/2019   BCR 14 07/30/2019   GFR 113.32 12/16/2010   GFRAA 107 07/30/2019   GFRNONAA 93 07/30/2019    Hepatic Lab Results  Component Value Date   AST 14 07/30/2019   ALT 23 10/29/2015   ALBUMIN 3.9 07/30/2019   ALKPHOS 91 07/30/2019    Electrolytes Lab Results  Component Value Date   NA 143 07/30/2019   K 3.8 07/30/2019   CL 100 07/30/2019   CALCIUM 8.7 07/30/2019   MG 1.7 07/30/2019    Bone Lab Results  Component Value Date   25OHVITD1 42  07/30/2019   25OHVITD2 1.1 07/30/2019   25OHVITD3 41 07/30/2019    Inflammation (CRP: Acute Phase) (ESR: Chronic Phase) Lab Results  Component Value Date   CRP 12 (H) 07/30/2019   ESRSEDRATE 18 07/30/2019         Note: Above Lab results reviewed.  Recent Imaging Review  DG PAIN CLINIC C-ARM 1-60 MIN NO REPORT Fluoro was used, but no Radiologist interpretation will be provided.  Please refer to NOTES tab for provider progress note. Note: Reviewed        Physical Exam  Vitals: BP (!) 160/69   Pulse 76   Temp (!) 97.2 F (36.2 C)   Ht 5' 2 (1.575 m)   Wt 155 lb (70.3 kg)   SpO2 99%   BMI 28.35 kg/m  BMI: Estimated body mass index is 28.35 kg/m as calculated from the following:   Height as of this encounter: 5' 2 (1.575 m).   Weight as of this encounter: 155 lb (70.3 kg). Ideal: Ideal body weight: 50.1 kg (110 lb 7.2 oz) Adjusted ideal body weight: 58.2 kg (128 lb 4.3 oz) General appearance: Well nourished, well developed, and well hydrated. In no apparent acute distress Mental status: Alert, oriented x 3 (person, place, & time)       Respiratory: No evidence of acute respiratory distress Eyes: PERLA Heart: RR&R.  No carotid bruits. Lungs: CTA. No Rales or rhonchi. No wheezing.   Assessment   Diagnosis Status  1. Chronic low back pain (1ry area of Pain) (Left) w/ sciatica (Left)   2. Chronic lower  extremity pain (2ry area of Pain) (Left)   3. 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 End of battery life of intrathecal infusion pump   6. Chronic lower extremity radicular pain (S1) (Right)   7. Chronic lumbar radicular pain (L4 Dermatome) (Left)   8. DDD (degenerative disc disease), lumbosacral w/ LBP & LEP   9. Lumbar facet joint pain   10. Lumbar facet joint syndrome (Left)   11. Lumbar facet arthropathy (Multilevel) (Bilateral)   12. Chronic pain syndrome   13. Pharmacologic  therapy   14. Acute pain of right shoulder    Controlled Controlled Controlled   Updated Problems: Problem  Acute Pain of Right Shoulder    Plan of Care  Problem-specific:  Assessment and Plan    Chronic pain syndrome with postlaminectomy syndrome, lumbar radiculopathy, lumbosacral disc degeneration, and lumbar spondylosis   Chronic pain syndrome is managed with an implanted pain pump. She reports significant shoulder pain, but steroid injection is deferred due to upcoming surgery. Administer antibiotics post-surgery to prevent infection. Use staples for wound closure during surgery. Schedule a follow-up appointment 11 days post-surgery for staple removal. Instruct her to keep the wound covered for 48 hours, then clean daily with soap and water. Perform a pump readout to ensure proper functioning pre-surgery.  Preoperative evaluation for pain pump replacement surgery   Preoperative evaluation is required before the scheduled pain pump replacement surgery. The pre-admission appointment is set for Monday, February 05, 2024, at 9 AM. Evaluation includes heart and lung assessment to ensure no contraindications for surgery. Steroids are contraindicated two weeks before and after surgery due to decreased immune function and increased infection risk. Ensure attendance at the pre-admission appointment on February 05, 2024, at 9 AM. Perform preoperative evaluation including heart and lung assessment. Avoid administration of steroids two weeks before and after surgery.       Tiffany Hunter has a current medication list which includes the following long-term medication(s): albuterol, carvedilol, diethylpropion hcl cr, losartan, naloxegol  oxalate, and naloxone .  Pharmacotherapy (Medications Ordered): Meds ordered this encounter  Medications   ketorolac  (TORADOL ) injection 60 mg   Orders:  Orders Placed This Encounter  Procedures   Intrathecal Pump, Reservoir revision and possible  replacement    Standing Status:   Future    Expiration Date:   04/29/2024    Scheduling Instructions:     Please schedule this patient for an intrathecal pump revision and possible replacement.   Intrathecal programmable pump analysis    Standing Status:   Future    Expiration Date:   01/28/2025   Comp. Metabolic Panel (12)    With GFR. Indications: Chronic Pain Syndrome (G89.4) & Pharmacotherapy (S20.100) Cc PCP: Neita Dover, PA-C    Has the patient fasted?:   No    CC Results:   PCP-NURSE [[id number]]    Release to patient:   Immediate   Informed Consent Details: Physician/Practitioner Attestation; Transcribe to consent form and obtain patient signature    Do not administer NSAIDs (Toradol , etc.) if patient has an allergy or intolerance to NSAIDs or if patient has CKD (chronic kidney disease or failure). Avoid the use of Muscle Relaxants (orphenedrine/Norflex, etc.) if patient has allergy or intolerance to this medication.    Scheduling Instructions:     Nursing orders: Complete pain questionnaire before administration of therapy. Document location, laterality, onset, level, trigger, and current medications taken for the pain.  Physician/Practitioner attestation of informed consent for procedure/surgical case:   I, the physician/practitioner, attest that I have discussed with the patient the benefits, risks, side effects, alternatives, likelihood of achieving goals and potential problems during recovery for the procedure that I have provided informed consent.    Procedure:   IM injection of therapeutic substance    Physician/Practitioner performing the procedure:   Satrina Magallanes A. Tanya, MD    Indication/Reason:   Acute on chronic pain     Interventional Therapies  Risk  Complexity Considerations:   Presence of intrathecal pump  DM  CAD  Pulmonary hypertension     Planned  Pending:   Revision and replacement of Medtronic intrathecal pump.    Under consideration:    Diagnostic/therapeutic bilateral lumbar (L4-5, L5-S1) facet MBB L3R1  Diagnostic left lumbar facet MBB #3  Possible left lumbar facet RFA #1   Completed:   Diagnostic/therapeutic right caudal ESI x1 (01/04/2022) (100/100/100/100) (of right leg pain)  Diagnostic left lumbar facet MBB x2 (12/22/2016) (07/14/2020) (100/100/100/100) (of left low back pain)    Therapeutic  Palliative (PRN) options:   Palliative intrathecal pump refills  Therapeutic/palliative left sided lumbar facet MBB #3       Return for scheduled encounter for intrathecal pump revision and replacement.    Recent Visits Date Type Provider Dept  12/19/23 Procedure visit Tanya Glisson, MD [facility]  Showing recent visits within past 90 days and meeting all other requirements Today's Visits Date Type Provider Dept  01/29/24 Office Visit Tanya Glisson, MD [facility]  Showing today's visits and meeting all other requirements Future Appointments Date Type Provider Dept  02/21/24 Appointment Tanya Glisson, MD [facility]  Showing future appointments within next 90 days and meeting all other requirements  I discussed the assessment and treatment plan with the patient. The patient was provided an opportunity to ask questions and all were answered. The patient agreed with the plan and demonstrated an understanding of the instructions.  Patient advised to call back or seek an in-person evaluation if the symptoms or condition worsens.  Duration of encounter: 30 minutes.  Total time on encounter, as per AMA guidelines included both the face-to-face and non-face-to-face time personally spent by the physician and/or other qualified health care professional(s) on the day of the encounter (includes time in activities that require the physician or other qualified health care professional and does not include time in activities normally performed by clinical staff). Physician's time may  include the following activities when performed: Preparing to see the patient (e.g., pre-charting review of records, searching for previously ordered imaging, lab work, and nerve conduction tests) Review of prior analgesic pharmacotherapies. Reviewing PMP Interpreting ordered tests (e.g., lab work, imaging, nerve conduction tests) Performing post-procedure evaluations, including interpretation of diagnostic procedures Obtaining and/or reviewing separately obtained history Performing a medically appropriate examination and/or evaluation Counseling and educating the patient/family/caregiver Ordering medications, tests, or procedures Referring and communicating with other health care professionals (when not separately reported) Documenting clinical information in the electronic or other health record Independently interpreting results (not separately reported) and communicating results to the patient/ family/caregiver Care coordination (not separately reported)  Note by: Glisson DELENA Tanya, MD (TTS and AI technology used. I apologize for any typographical errors that were not detected and corrected.) Date: 01/29/2024; Time: 2:06 PM

## 2024-01-28 NOTE — H&P (View-Only) (Signed)
 PROVIDER NOTE: Interpretation of information contained herein should be left to medically-trained personnel. Specific patient instructions are provided elsewhere under Patient Instructions section of medical record. This document was created in part using AI and STT-dictation technology, any transcriptional errors that may result from this process are unintentional.  Patient: Tiffany Hunter  Service: E/M   PCP: Tiffany Dover, PA-C  DOB: [birth year]  DOS: 01/29/2024  Provider: Eric DELENA Como, MD  MRN: [id number]  Delivery: Face-to-face  Specialty: Interventional Pain Management  Type: Established Patient  Setting: Ambulatory outpatient facility  Specialty designation: 09  Referring Prov.: Tiffany Dover, PA-C  Location: Outpatient office facility       History of present illness (HPI) [name], a 78 y.o. year old female, is here today because of her Chronic left-sided low back pain with left-sided sciatica [M54.42, G89.29]. 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); Chronic lumbar radicular pain (L4 Dermatome) (Left); Coccygodynia; Chronic hip pain (Left); Osteoarthritis of hip (Left); Chronic knee pain (Left); Osteoarthritis of knee (Left); Thoracic bulging disc without myelopathy; Generalized weakness; Chronic lower extremity pain (2ry area of Pain) (Left); Chronic sacroiliac joint pain (3ry area of Pain) (Left); Generalized abdominal pain; Right elbow pain; Lumbar facet joint syndrome (Left); End of battery life of intrathecal infusion pump; Spondylosis  without myelopathy or radiculopathy, lumbosacral region; Chronic low back pain (Left) w/o sciatica; Osteoarthritis of lumbar spine; Hyperalgesia; Shoulder arthritis; H/O total shoulder replacement, left; Spondylosis of lumbar spine; Pancreatic mass; Chronic lower extremity pain (Right); Chronic low back pain (Bilateral) w/ sciatica (Right); Chronic lower extremity radicular pain (S1) (Right); Chronic low back pain (Bilateral) w/o sciatica; Lumbar facet joint pain; Lumbar facet arthropathy (Multilevel) (Bilateral); DDD (degenerative disc disease), lumbosacral w/ LBP & LEP; Encounter for adjustment and management of infusion pump; Encounter for interrogation of infusion pump; and Acute pain of right shoulder on their pertinent problem list.  Pain Assessment: Severity of Chronic pain is reported as a 9 /10. Location: Shoulder Right/radiates down right arm to fingertips. Onset: More than a month ago. Quality: Sharp. Timing: Constant. Modifying factor(s): shots. Vitals:  height is 5' 2 (1.575 m) and weight is 155 lb (70.3 kg). Her temperature is 97.2 F (36.2 C) (abnormal). Her blood pressure is 160/69 (abnormal) and her pulse is 76. Her oxygen saturation is 99%.  BMI: Estimated body mass index is 28.35 kg/m as calculated from the following:   Height as of this encounter: 5' 2 (1.575 m).   Weight as of this encounter: 155 lb (70.3 kg).  Last encounter: 04/26/2023. Last procedure: 12/19/2023.  Reason for encounter: Preoperative evaluation for possible interventional PM therapy/treatment.   Discussed the use of AI scribe software for clinical note transcription with the patient, who gave verbal consent to proceed.  History of Present Illness   Tiffany Hunter is a 78 year old female who presents for pre-operative evaluation prior to pump replacement surgery.  She experiences severe shoulder pain. She is seeking relief from this pain.      Pharmacotherapy Assessment   Opioid Analgesic: No other oral  opioid analgesics prescribed by our practice. Intrathecal PF-Hydromorphone 3.9295 mg/day + PF-Sufentanil 34.383 mcg/day. MME: [facility]. 58.7  mg/day.     Monitoring: [place] PMP: PDMP reviewed during this encounter.       Pharmacotherapy: No side-effects or adverse reactions reported. Compliance: No problems identified. Effectiveness: Clinically acceptable.  Tiffany Nathanel PARAS, RN  01/29/2024  1:28 PM  Sign when Signing Visit Safety precautions to be maintained throughout the outpatient stay will include: orient to surroundings, keep bed in low position, maintain call bell within reach at all times, provide assistance with transfer out of bed and ambulation.     UDS:  Summary  Date Value Ref Range Status  04/26/2023 FINAL  Final    Comment:    ==================================================================== ToxASSURE Select 13 (MW) ==================================================================== Specimen Alert ToxAssure, ToxAssure FLEX or MAT drug testing: -Technical component- Result certification performed at [facility], [address] [place], [place] [address]. [phone] Lab Director Tiffany Hunter, PhrmD. ==================================================================== Test                             Result       Flag       Units  Drug Present and Declared for Prescription Verification   Hydromorphone                  2187         EXPECTED   ng/mg creat    Hydromorphone may be administered as a scheduled prescription    medication; it is also an expected metabolite of hydrocodone.  Drug Absent but Declared for Prescription Verification   Sufentanil                     Not Detected UNEXPECTED ng/mg creat ==================================================================== Test                      Result    Flag   Units      Ref Range   Creatinine              83               mg/dL       >=79 ==================================================================== Declared Medications:  The flagging and interpretation on this report are based on the  following declared medications.  Unexpected results may arise from  inaccuracies in the declared medications.   **Note: The testing scope of this panel includes these medications:   Hydromorphone  Sufentanil   **Note: The testing scope of this panel does not include the  following reported medications:   Albuterol (Ventolin HFA)  Atorvastatin (Lipitor)  Carvedilol (Coreg)  Celecoxib (Celebrex)  Diethylpropion  Losartan (Cozaar)  Meloxicam (Mobic)  Naloxegol  (Movantik )  Naloxone  (Narcan )  Omeprazole (Prilosec)  Oxybutynin (Ditropan)  Sulfadiazine (SSD) ==================================================================== For clinical consultation, please call [phone]. ====================================================================     No results found for: CBDTHCR No results found for: D8THCCBX No results found for: D9THCCBX  ROS  Constitutional: Denies any fever or chills Gastrointestinal: No reported hemesis, hematochezia, vomiting, or acute GI distress Musculoskeletal: Denies any acute onset joint swelling, redness, loss of ROM, or weakness Neurological: No reported episodes of acute onset apraxia, aphasia, dysarthria, agnosia, amnesia, paralysis, loss of coordination, or loss of consciousness  Medication Review  Diethylpropion HCl CR, NON FORMULARY, albuterol, atorvastatin, carvedilol, celecoxib, losartan, meloxicam, naloxegol  oxalate, naloxone , omeprazole, oxybutynin, and silver sulfADIAZINE  History Review  Allergy: Tiffany Hunter is allergic to penicillin g, doxycycline hyclate, and sulfa antibiotics. Drug: Tiffany Hunter  reports no history of  drug use. Alcohol:  reports no history of alcohol use. Tobacco:  reports that she has never smoked. She has never used smokeless tobacco. Social:  Tiffany Hunter  reports that she has never smoked. She has never used smokeless tobacco. She reports that she does not drink alcohol and does not use drugs. 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 Surgical: Tiffany Hunter  has a past surgical history that includes Carpal tunnel release (Bilateral); Back surgery; Breast surgery (Bilateral); Cholecystectomy; Colonoscopy w/ polypectomy; Cataract extraction w/ intraocular lens implant (Right); Abdominal hysterectomy; Fracture surgery (Right); Cardiac catheterization (2012); and Pain pump revision (Left, 07/07/2017). Family: family history includes Cancer in her mother; Colon cancer in an other family member; Coronary artery disease in an other family member; Heart disease in her father.  Laboratory Chemistry Profile   Renal Lab Results  Component Value Date   BUN 8 07/30/2019   CREATININE 0.56 (L) 07/30/2019   BCR 14 07/30/2019   GFR 113.32 12/16/2010   GFRAA 107 07/30/2019   GFRNONAA 93 07/30/2019    Hepatic Lab Results  Component Value Date   AST 14 07/30/2019   ALT 23 10/29/2015   ALBUMIN 3.9 07/30/2019   ALKPHOS 91 07/30/2019    Electrolytes Lab Results  Component Value Date   NA 143 07/30/2019   K 3.8 07/30/2019   CL 100 07/30/2019   CALCIUM 8.7 07/30/2019   MG 1.7 07/30/2019    Bone Lab Results  Component Value Date   25OHVITD1 42  07/30/2019   25OHVITD2 1.1 07/30/2019   25OHVITD3 41 07/30/2019    Inflammation (CRP: Acute Phase) (ESR: Chronic Phase) Lab Results  Component Value Date   CRP 12 (H) 07/30/2019   ESRSEDRATE 18 07/30/2019         Note: Above Lab results reviewed.  Recent Imaging Review  DG PAIN CLINIC C-ARM 1-60 MIN NO REPORT Fluoro was used, but no Radiologist interpretation will be provided.  Please refer to NOTES tab for provider progress note. Note: Reviewed        Physical Exam  Vitals: BP (!) 160/69   Pulse 76   Temp (!) 97.2 F (36.2 C)   Ht 5' 2 (1.575 m)   Wt 155 lb (70.3 kg)   SpO2 99%   BMI 28.35 kg/m  BMI: Estimated body mass index is 28.35 kg/m as calculated from the following:   Height as of this encounter: 5' 2 (1.575 m).   Weight as of this encounter: 155 lb (70.3 kg). Ideal: Ideal body weight: 50.1 kg (110 lb 7.2 oz) Adjusted ideal body weight: 58.2 kg (128 lb 4.3 oz) General appearance: Well nourished, well developed, and well hydrated. In no apparent acute distress Mental status: Alert, oriented x 3 (person, place, & time)       Respiratory: No evidence of acute respiratory distress Eyes: PERLA Heart: RR&R.  No carotid bruits. Lungs: CTA. No Rales or rhonchi. No wheezing.   Assessment   Diagnosis Status  1. Chronic low back pain (1ry area of Pain) (Left) w/ sciatica (Left)   2. Chronic lower  extremity pain (2ry area of Pain) (Left)   3. 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 End of battery life of intrathecal infusion pump   6. Chronic lower extremity radicular pain (S1) (Right)   7. Chronic lumbar radicular pain (L4 Dermatome) (Left)   8. DDD (degenerative disc disease), lumbosacral w/ LBP & LEP   9. Lumbar facet joint pain   10. Lumbar facet joint syndrome (Left)   11. Lumbar facet arthropathy (Multilevel) (Bilateral)   12. Chronic pain syndrome   13. Pharmacologic  therapy   14. Acute pain of right shoulder    Controlled Controlled Controlled   Updated Problems: Problem  Acute Pain of Right Shoulder    Plan of Care  Problem-specific:  Assessment and Plan    Chronic pain syndrome with postlaminectomy syndrome, lumbar radiculopathy, lumbosacral disc degeneration, and lumbar spondylosis   Chronic pain syndrome is managed with an implanted pain pump. She reports significant shoulder pain, but steroid injection is deferred due to upcoming surgery. Administer antibiotics post-surgery to prevent infection. Use staples for wound closure during surgery. Schedule a follow-up appointment 11 days post-surgery for staple removal. Instruct her to keep the wound covered for 48 hours, then clean daily with soap and water. Perform a pump readout to ensure proper functioning pre-surgery.  Preoperative evaluation for pain pump replacement surgery   Preoperative evaluation is required before the scheduled pain pump replacement surgery. The pre-admission appointment is set for Monday, February 05, 2024, at 9 AM. Evaluation includes heart and lung assessment to ensure no contraindications for surgery. Steroids are contraindicated two weeks before and after surgery due to decreased immune function and increased infection risk. Ensure attendance at the pre-admission appointment on February 05, 2024, at 9 AM. Perform preoperative evaluation including heart and lung assessment. Avoid administration of steroids two weeks before and after surgery.       Ms. Clinton Wahlberg has a current medication list which includes the following long-term medication(s): albuterol, carvedilol, diethylpropion hcl cr, losartan, naloxegol  oxalate, and naloxone .  Pharmacotherapy (Medications Ordered): Meds ordered this encounter  Medications   ketorolac  (TORADOL ) injection 60 mg   Orders:  Orders Placed This Encounter  Procedures   Intrathecal Pump, Reservoir revision and possible  replacement    Standing Status:   Future    Expiration Date:   04/29/2024    Scheduling Instructions:     Please schedule this patient for an intrathecal pump revision and possible replacement.   Intrathecal programmable pump analysis    Standing Status:   Future    Expiration Date:   01/28/2025   Comp. Metabolic Panel (12)    With GFR. Indications: Chronic Pain Syndrome (G89.4) & Pharmacotherapy (S20.100) Cc PCP: Tiffany Dover, PA-C    Has the patient fasted?:   No    CC Results:   PCP-NURSE [[id number]]    Release to patient:   Immediate   Informed Consent Details: Physician/Practitioner Attestation; Transcribe to consent form and obtain patient signature    Do not administer NSAIDs (Toradol , etc.) if patient has an allergy or intolerance to NSAIDs or if patient has CKD (chronic kidney disease or failure). Avoid the use of Muscle Relaxants (orphenedrine/Norflex, etc.) if patient has allergy or intolerance to this medication.    Scheduling Instructions:     Nursing orders: Complete pain questionnaire before administration of therapy. Document location, laterality, onset, level, trigger, and current medications taken for the pain.  Physician/Practitioner attestation of informed consent for procedure/surgical case:   I, the physician/practitioner, attest that I have discussed with the patient the benefits, risks, side effects, alternatives, likelihood of achieving goals and potential problems during recovery for the procedure that I have provided informed consent.    Procedure:   IM injection of therapeutic substance    Physician/Practitioner performing the procedure:   Satrina Magallanes A. Tanya, MD    Indication/Reason:   Acute on chronic pain     Interventional Therapies  Risk  Complexity Considerations:   Presence of intrathecal pump  DM  CAD  Pulmonary hypertension     Planned  Pending:   Revision and replacement of Medtronic intrathecal pump.    Under consideration:    Diagnostic/therapeutic bilateral lumbar (L4-5, L5-S1) facet MBB L3R1  Diagnostic left lumbar facet MBB #3  Possible left lumbar facet RFA #1   Completed:   Diagnostic/therapeutic right caudal ESI x1 (01/04/2022) (100/100/100/100) (of right leg pain)  Diagnostic left lumbar facet MBB x2 (12/22/2016) (07/14/2020) (100/100/100/100) (of left low back pain)    Therapeutic  Palliative (PRN) options:   Palliative intrathecal pump refills  Therapeutic/palliative left sided lumbar facet MBB #3       Return for scheduled encounter for intrathecal pump revision and replacement.    Recent Visits Date Type Provider Dept  12/19/23 Procedure visit Tanya Glisson, MD [facility]  Showing recent visits within past 90 days and meeting all other requirements Today's Visits Date Type Provider Dept  01/29/24 Office Visit Tanya Glisson, MD [facility]  Showing today's visits and meeting all other requirements Future Appointments Date Type Provider Dept  02/21/24 Appointment Tanya Glisson, MD [facility]  Showing future appointments within next 90 days and meeting all other requirements  I discussed the assessment and treatment plan with the patient. The patient was provided an opportunity to ask questions and all were answered. The patient agreed with the plan and demonstrated an understanding of the instructions.  Patient advised to call back or seek an in-person evaluation if the symptoms or condition worsens.  Duration of encounter: 30 minutes.  Total time on encounter, as per AMA guidelines included both the face-to-face and non-face-to-face time personally spent by the physician and/or other qualified health care professional(s) on the day of the encounter (includes time in activities that require the physician or other qualified health care professional and does not include time in activities normally performed by clinical staff). Physician's time may  include the following activities when performed: Preparing to see the patient (e.g., pre-charting review of records, searching for previously ordered imaging, lab work, and nerve conduction tests) Review of prior analgesic pharmacotherapies. Reviewing PMP Interpreting ordered tests (e.g., lab work, imaging, nerve conduction tests) Performing post-procedure evaluations, including interpretation of diagnostic procedures Obtaining and/or reviewing separately obtained history Performing a medically appropriate examination and/or evaluation Counseling and educating the patient/family/caregiver Ordering medications, tests, or procedures Referring and communicating with other health care professionals (when not separately reported) Documenting clinical information in the electronic or other health record Independently interpreting results (not separately reported) and communicating results to the patient/ family/caregiver Care coordination (not separately reported)  Note by: Glisson DELENA Tanya, MD (TTS and AI technology used. I apologize for any typographical errors that were not detected and corrected.) Date: 01/29/2024; Time: 2:06 PM

## 2024-01-29 ENCOUNTER — Ambulatory Visit: Attending: Pain Medicine | Admitting: Pain Medicine

## 2024-01-29 ENCOUNTER — Encounter: Payer: Self-pay | Admitting: Pain Medicine

## 2024-01-29 VITALS — BP 160/69 | HR 76 | Temp 97.2°F | Ht 62.0 in | Wt 155.0 lb

## 2024-01-29 DIAGNOSIS — M5459 Other low back pain: Secondary | ICD-10-CM | POA: Insufficient documentation

## 2024-01-29 DIAGNOSIS — Z462 Encounter for fitting and adjustment of other devices related to nervous system and special senses: Secondary | ICD-10-CM | POA: Diagnosis present

## 2024-01-29 DIAGNOSIS — M25511 Pain in right shoulder: Secondary | ICD-10-CM | POA: Diagnosis present

## 2024-01-29 DIAGNOSIS — M5410 Radiculopathy, site unspecified: Secondary | ICD-10-CM | POA: Insufficient documentation

## 2024-01-29 DIAGNOSIS — M47816 Spondylosis without myelopathy or radiculopathy, lumbar region: Secondary | ICD-10-CM | POA: Diagnosis present

## 2024-01-29 DIAGNOSIS — G894 Chronic pain syndrome: Secondary | ICD-10-CM

## 2024-01-29 DIAGNOSIS — G8929 Other chronic pain: Secondary | ICD-10-CM | POA: Diagnosis present

## 2024-01-29 DIAGNOSIS — Z79899 Other long term (current) drug therapy: Secondary | ICD-10-CM | POA: Diagnosis present

## 2024-01-29 DIAGNOSIS — M961 Postlaminectomy syndrome, not elsewhere classified: Secondary | ICD-10-CM

## 2024-01-29 DIAGNOSIS — M5416 Radiculopathy, lumbar region: Secondary | ICD-10-CM

## 2024-01-29 DIAGNOSIS — M5442 Lumbago with sciatica, left side: Secondary | ICD-10-CM | POA: Insufficient documentation

## 2024-01-29 DIAGNOSIS — Z978 Presence of other specified devices: Secondary | ICD-10-CM | POA: Diagnosis not present

## 2024-01-29 DIAGNOSIS — M51372 Other intervertebral disc degeneration, lumbosacral region with discogenic back pain and lower extremity pain: Secondary | ICD-10-CM

## 2024-01-29 DIAGNOSIS — M79605 Pain in left leg: Secondary | ICD-10-CM

## 2024-01-29 MED ORDER — KETOROLAC TROMETHAMINE 60 MG/2ML IM SOLN
60.0000 mg | Freq: Once | INTRAMUSCULAR | Status: AC
  Administered 2024-01-29: 60 mg via INTRAMUSCULAR
  Filled 2024-01-29: qty 2

## 2024-01-29 NOTE — Patient Instructions (Signed)

## 2024-01-29 NOTE — Progress Notes (Signed)
 Safety precautions to be maintained throughout the outpatient stay will include: orient to surroundings, keep bed in low position, maintain call bell within reach at all times, provide assistance with transfer out of bed and ambulation.

## 2024-01-30 LAB — COMP. METABOLIC PANEL (12)
AST: 21 IU/L (ref 0–40)
Albumin: 4.4 g/dL (ref 3.8–4.8)
Alkaline Phosphatase: 102 IU/L (ref 51–125)
BUN/Creatinine Ratio: 14 (ref 12–28)
BUN: 9 mg/dL (ref 8–27)
Bilirubin Total: 0.6 mg/dL (ref 0.0–1.2)
Calcium: 9.6 mg/dL (ref 8.7–10.3)
Chloride: 100 mmol/L (ref 96–106)
Creatinine, Ser: 0.66 mg/dL (ref 0.57–1.00)
Globulin, Total: 2.3 g/dL (ref 1.5–4.5)
Glucose: 87 mg/dL (ref 70–99)
Potassium: 4.3 mmol/L (ref 3.5–5.2)
Sodium: 141 mmol/L (ref 134–144)
Total Protein: 6.7 g/dL (ref 6.0–8.5)
eGFR: 90 mL/min/1.73 (ref >59)

## 2024-02-05 ENCOUNTER — Other Ambulatory Visit: Payer: Self-pay | Admitting: Pain Medicine

## 2024-02-05 ENCOUNTER — Other Ambulatory Visit: Payer: Self-pay

## 2024-02-05 ENCOUNTER — Encounter
Admission: RE | Admit: 2024-02-05 | Discharge: 2024-02-05 | Disposition: A | Discharge status: Home / Self Care | Source: Ambulatory Visit | Attending: Pain Medicine | Admitting: Pain Medicine

## 2024-02-05 VITALS — BP 184/88 | HR 67 | Resp 12 | Ht 62.0 in | Wt 161.5 lb

## 2024-02-05 DIAGNOSIS — I2510 Atherosclerotic heart disease of native coronary artery without angina pectoris: Secondary | ICD-10-CM | POA: Insufficient documentation

## 2024-02-05 DIAGNOSIS — Z01812 Encounter for preprocedural laboratory examination: Secondary | ICD-10-CM | POA: Diagnosis present

## 2024-02-05 DIAGNOSIS — I4519 Other right bundle-branch block: Secondary | ICD-10-CM | POA: Insufficient documentation

## 2024-02-05 DIAGNOSIS — Z01818 Encounter for other preprocedural examination: Secondary | ICD-10-CM | POA: Insufficient documentation

## 2024-02-05 DIAGNOSIS — I10 Essential (primary) hypertension: Secondary | ICD-10-CM

## 2024-02-05 DIAGNOSIS — Z01810 Encounter for preprocedural cardiovascular examination: Secondary | ICD-10-CM

## 2024-02-05 DIAGNOSIS — Z978 Presence of other specified devices: Secondary | ICD-10-CM | POA: Insufficient documentation

## 2024-02-05 HISTORY — DX: Diverticulosis of intestine, part unspecified, without perforation or abscess without bleeding: K57.90

## 2024-02-05 HISTORY — DX: Atherosclerosis of aorta: I70.0

## 2024-02-05 HISTORY — DX: Chronic kidney disease, stage 2 (mild): N18.2

## 2024-02-05 HISTORY — DX: Unspecified cirrhosis of liver: K74.60

## 2024-02-05 HISTORY — DX: Disease of pancreas, unspecified: K86.9

## 2024-02-05 LAB — SURGICAL PCR SCREEN
MRSA, PCR: NEGATIVE
Staphylococcus aureus: POSITIVE — AB

## 2024-02-05 NOTE — Patient Instructions (Addendum)
 Your procedure is scheduled on:02-13-24 Tuesday Report to the Registration Desk on the 1st floor of the [facility].Then proceed to the 2nd floor Surgery Desk To find out your arrival time, please call [phone] between 1PM - 3PM on:02-12-24 Monday If your arrival time is 6:00 am, do not arrive before that time as the [facility] entrance doors do not open until 6:00 am.  REMEMBER: Instructions that are not followed completely may result in serious medical risk, up to and including death; or upon the discretion of your surgeon and anesthesiologist your surgery may need to be rescheduled.  Do not eat food after midnight the night before surgery.  No gum chewing or hard candies.  You may however, drink CLEAR liquids up to 2 hours before you are scheduled to arrive for your surgery. Do not drink anything within 2 hours of your scheduled arrival time.  Clear liquids include: - water  - apple juice without pulp - gatorade (not RED colors) - black coffee or tea (Do NOT add milk or creamers to the coffee or tea) Do NOT drink anything that is not on this list.  One week prior to surgery:STOP NOW (02-05-24) Stop Anti-inflammatories (NSAIDS) such as Advil, Aleve, Ibuprofen, Motrin, Naproxen, Naprosyn and Aspirin based products such as Excedrin, Goody's Powder, BC Powder. Stop ANY OVER THE COUNTER supplements until after surgery (Vitamin C)  You may however, continue to take Tylenol if needed for pain up until the day of surgery.  Continue taking all of your other prescription medications up until the day of surgery.  ON THE DAY OF SURGERY ONLY TAKE THESE MEDICATIONS WITH SIPS OF WATER: -omeprazole (PRILOSEC)   Bring your Albuterol Inhaler to the hospital  No Alcohol for 24 hours before or after surgery.  No Smoking including e-cigarettes for 24 hours before surgery.  No chewable tobacco products for at least 6 hours before surgery.  No nicotine patches on the day of surgery.  Do not  use any recreational drugs for at least a week (preferably 2 weeks) before your surgery.  Please be advised that the combination of cocaine and anesthesia may have negative outcomes, up to and including death. If you test positive for cocaine, your surgery will be cancelled.  On the morning of surgery brush your teeth with toothpaste and water, you may rinse your mouth with mouthwash if you wish. Do not swallow any toothpaste or mouthwash.  Use CHG Soap as directed on instruction sheet.  Do not wear jewelry, make-up, hairpins, clips or nail polish.  For welded (permanent) jewelry: bracelets, anklets, waist bands, etc.  Please have this removed prior to surgery.  If it is not removed, there is a chance that hospital personnel will need to cut it off on the day of surgery.  Do not wear lotions, powders, or perfumes.   Do not shave body hair from the neck down 48 hours before surgery.  Contact lenses, hearing aids and dentures may not be worn into surgery.  Do not bring valuables to the hospital. [facility] is not responsible for any missing/lost belongings or valuables.   Notify your doctor if there is any change in your medical condition (cold, fever, infection).  Wear comfortable clothing (specific to your surgery type) to the hospital.  After surgery, you can help prevent lung complications by doing breathing exercises.  Take deep breaths and cough every 1-2 hours. Your doctor may order a device called an Incentive Spirometer to help you take deep breaths. When  coughing or sneezing, hold a pillow firmly against your incision with both hands. This is called "splinting." Doing this helps protect your incision. It also decreases belly discomfort.  If you are being admitted to the hospital overnight, leave your suitcase in the car. After surgery it may be brought to your room.  In case of increased patient census, it may be necessary for you, the patient, to continue your postoperative  care in the Same Day Surgery department.  If you are being discharged the day of surgery, you will not be allowed to drive home. You will need a responsible individual to drive you home and stay with you for 24 hours after surgery.   If you are taking public transportation, you will need to have a responsible individual with you.  Please call the Pre-admissions Testing Dept. at [phone] if you have any questions about these instructions.  Surgery Visitation Policy:  Patients having surgery or a procedure may have two visitors.  Children under the age of 30 must have an adult with them who is not the patient.                                                                                                             Preparing for Surgery with CHLORHEXIDINE  GLUCONATE (CHG) Soap  Chlorhexidine  Gluconate (CHG) Soap  o An antiseptic cleaner that kills germs and bonds with the skin to continue killing germs even after washing  o Used for showering the night before surgery and morning of surgery  Before surgery, you can play an important role by reducing the number of germs on your skin.  CHG (Chlorhexidine  gluconate) soap is an antiseptic cleanser which kills germs and bonds with the skin to continue killing germs even after washing.  Please do not use if you have an allergy to CHG or antibacterial soaps. If your skin becomes reddened/irritated stop using the CHG.  1. Shower the NIGHT BEFORE SURGERY and the MORNING OF SURGERY with CHG soap.  2. If you choose to wash your hair, wash your hair first as usual with your normal shampoo.  3. After shampooing, rinse your hair and body thoroughly to remove the shampoo.  4. Use CHG as you would any other liquid soap. You can apply CHG directly to the skin and wash gently with a scrungie or a clean washcloth.  5. Apply the CHG soap to your body only from the neck down. Do not use on open wounds or open sores. Avoid contact with your eyes,  ears, mouth, and genitals (private parts). Wash face and genitals (private parts) with your normal soap.  6. Wash thoroughly, paying special attention to the area where your surgery will be performed.  7. Thoroughly rinse your body with warm water.  8. Do not shower/wash with your normal soap after using and rinsing off the CHG soap.  9. Pat yourself dry with a clean towel.  10. Wear clean pajamas to bed the night before surgery.  12. Place clean sheets on your bed the night  of your first shower and do not sleep with pets.  13. Shower again with the CHG soap on the day of surgery prior to arriving at the hospital.  14. Do not apply any deodorants/lotions/powders.  15. Please wear clean clothes to the hospital.   [employer] to address health-related social needs:  [email or web address]

## 2024-02-13 ENCOUNTER — Ambulatory Visit
Admission: RE | Admit: 2024-02-13 | Discharge: 2024-02-13 | Disposition: A | Discharge status: Home / Self Care | Source: Ambulatory Visit | Attending: Pain Medicine | Admitting: Pain Medicine

## 2024-02-13 ENCOUNTER — Encounter: Payer: Self-pay | Admitting: Pain Medicine

## 2024-02-13 ENCOUNTER — Ambulatory Visit: Payer: Self-pay | Admitting: Urgent Care

## 2024-02-13 ENCOUNTER — Ambulatory Visit
Admission: RE | Disposition: A | Discharge status: Home / Self Care | Payer: Self-pay | Source: Ambulatory Visit | Attending: Pain Medicine

## 2024-02-13 ENCOUNTER — Other Ambulatory Visit: Payer: Self-pay

## 2024-02-13 DIAGNOSIS — Z8249 Family history of ischemic heart disease and other diseases of the circulatory system: Secondary | ICD-10-CM | POA: Diagnosis not present

## 2024-02-13 DIAGNOSIS — T85695A Other mechanical complication of other nervous system device, implant or graft, initial encounter: Secondary | ICD-10-CM

## 2024-02-13 DIAGNOSIS — K219 Gastro-esophageal reflux disease without esophagitis: Secondary | ICD-10-CM | POA: Diagnosis not present

## 2024-02-13 DIAGNOSIS — M5117 Intervertebral disc disorders with radiculopathy, lumbosacral region: Secondary | ICD-10-CM | POA: Insufficient documentation

## 2024-02-13 DIAGNOSIS — M961 Postlaminectomy syndrome, not elsewhere classified: Secondary | ICD-10-CM | POA: Diagnosis not present

## 2024-02-13 DIAGNOSIS — K449 Diaphragmatic hernia without obstruction or gangrene: Secondary | ICD-10-CM | POA: Diagnosis not present

## 2024-02-13 DIAGNOSIS — Z451 Encounter for adjustment and management of infusion pump: Secondary | ICD-10-CM | POA: Diagnosis present

## 2024-02-13 DIAGNOSIS — I129 Hypertensive chronic kidney disease with stage 1 through stage 4 chronic kidney disease, or unspecified chronic kidney disease: Secondary | ICD-10-CM | POA: Insufficient documentation

## 2024-02-13 DIAGNOSIS — G709 Myoneural disorder, unspecified: Secondary | ICD-10-CM | POA: Diagnosis not present

## 2024-02-13 DIAGNOSIS — G894 Chronic pain syndrome: Secondary | ICD-10-CM | POA: Insufficient documentation

## 2024-02-13 DIAGNOSIS — J45909 Unspecified asthma, uncomplicated: Secondary | ICD-10-CM | POA: Diagnosis not present

## 2024-02-13 DIAGNOSIS — I2510 Atherosclerotic heart disease of native coronary artery without angina pectoris: Secondary | ICD-10-CM | POA: Diagnosis not present

## 2024-02-13 DIAGNOSIS — M810 Age-related osteoporosis without current pathological fracture: Secondary | ICD-10-CM | POA: Diagnosis not present

## 2024-02-13 DIAGNOSIS — Z978 Presence of other specified devices: Secondary | ICD-10-CM

## 2024-02-13 DIAGNOSIS — E1122 Type 2 diabetes mellitus with diabetic chronic kidney disease: Secondary | ICD-10-CM | POA: Insufficient documentation

## 2024-02-13 DIAGNOSIS — Z462 Encounter for fitting and adjustment of other devices related to nervous system and special senses: Secondary | ICD-10-CM | POA: Diagnosis present

## 2024-02-13 DIAGNOSIS — E6609 Other obesity due to excess calories: Secondary | ICD-10-CM | POA: Diagnosis not present

## 2024-02-13 DIAGNOSIS — Z79891 Long term (current) use of opiate analgesic: Secondary | ICD-10-CM

## 2024-02-13 DIAGNOSIS — M4726 Other spondylosis with radiculopathy, lumbar region: Secondary | ICD-10-CM | POA: Diagnosis not present

## 2024-02-13 DIAGNOSIS — E785 Hyperlipidemia, unspecified: Secondary | ICD-10-CM | POA: Insufficient documentation

## 2024-02-13 DIAGNOSIS — Z6829 Body mass index (BMI) 29.0-29.9, adult: Secondary | ICD-10-CM | POA: Diagnosis not present

## 2024-02-13 DIAGNOSIS — N182 Chronic kidney disease, stage 2 (mild): Secondary | ICD-10-CM | POA: Insufficient documentation

## 2024-02-13 DIAGNOSIS — I272 Pulmonary hypertension, unspecified: Secondary | ICD-10-CM | POA: Diagnosis not present

## 2024-02-13 DIAGNOSIS — M51372 Other intervertebral disc degeneration, lumbosacral region with discogenic back pain and lower extremity pain: Secondary | ICD-10-CM | POA: Diagnosis present

## 2024-02-13 DIAGNOSIS — G8929 Other chronic pain: Secondary | ICD-10-CM | POA: Diagnosis present

## 2024-02-13 HISTORY — PX: PAIN PUMP IMPLANTATION: SHX330

## 2024-02-13 SURGERY — PAIN PUMP INSERTION
Anesthesia: General

## 2024-02-13 MED ORDER — PROPOFOL 1000 MG/100ML IV EMUL
INTRAVENOUS | Status: AC
  Filled 2024-02-13: qty 100

## 2024-02-13 MED ORDER — FENTANYL CITRATE (PF) 100 MCG/2ML IJ SOLN
INTRAMUSCULAR | Status: DC | PRN
  Administered 2024-02-13: 25 ug via INTRAVENOUS
  Administered 2024-02-13: 75 ug via INTRAVENOUS

## 2024-02-13 MED ORDER — OXYCODONE HCL 5 MG PO TABS
5.0000 mg | ORAL_TABLET | Freq: Once | ORAL | Status: AC
  Administered 2024-02-13: 5 mg via ORAL

## 2024-02-13 MED ORDER — MIDAZOLAM HCL 2 MG/2ML IJ SOLN
INTRAMUSCULAR | Status: AC
  Filled 2024-02-13: qty 2

## 2024-02-13 MED ORDER — EPHEDRINE SULFATE-NACL 50-0.9 MG/10ML-% IV SOSY
PREFILLED_SYRINGE | INTRAVENOUS | Status: DC | PRN
  Administered 2024-02-13 (×4): 5 mg via INTRAVENOUS

## 2024-02-13 MED ORDER — SODIUM CHLORIDE (PF) 0.9 % IJ SOLN
INTRAMUSCULAR | Status: AC
  Filled 2024-02-13: qty 100

## 2024-02-13 MED ORDER — MIDAZOLAM HCL 2 MG/2ML IJ SOLN
INTRAMUSCULAR | Status: DC | PRN
Start: 2024-02-13 — End: 2024-02-13
  Administered 2024-02-13: 1 mg via INTRAVENOUS

## 2024-02-13 MED ORDER — PROPOFOL 500 MG/50ML IV EMUL
INTRAVENOUS | Status: DC | PRN
  Administered 2024-02-13: 75 ug/kg/min via INTRAVENOUS

## 2024-02-13 MED ORDER — 0.9 % SODIUM CHLORIDE (POUR BTL) OPTIME
TOPICAL | Status: DC | PRN
  Administered 2024-02-13: 500 mL

## 2024-02-13 MED ORDER — ACETAMINOPHEN 10 MG/ML IV SOLN
INTRAVENOUS | Status: AC
  Filled 2024-02-13: qty 100

## 2024-02-13 MED ORDER — ORAL CARE MOUTH RINSE: 15.0000 mL | Freq: Once | OROMUCOSAL | Status: AC

## 2024-02-13 MED ORDER — BACITRACIN ZINC 500 UNIT/GM EX OINT
TOPICAL_OINTMENT | CUTANEOUS | Status: AC
  Filled 2024-02-13: qty 28.35

## 2024-02-13 MED ORDER — DROPERIDOL 2.5 MG/ML IJ SOLN: 0.6250 mg | Freq: Once | INTRAMUSCULAR | Status: DC | PRN

## 2024-02-13 MED ORDER — LACTATED RINGERS IV SOLN: INTRAVENOUS | Status: DC

## 2024-02-13 MED ORDER — FENTANYL CITRATE (PF) 100 MCG/2ML IJ SOLN: 25.0000 ug | INTRAMUSCULAR | Status: DC | PRN

## 2024-02-13 MED ORDER — OXYCODONE HCL 5 MG PO TABS
ORAL_TABLET | ORAL | Status: AC
  Filled 2024-02-13: qty 1

## 2024-02-13 MED ORDER — CHLORHEXIDINE GLUCONATE 0.12 % MT SOLN: 15.0000 mL | Freq: Once | OROMUCOSAL | Status: DC

## 2024-02-13 MED ORDER — LIDOCAINE HCL (CARDIAC) PF 100 MG/5ML IV SOSY
PREFILLED_SYRINGE | INTRAVENOUS | Status: DC | PRN
  Administered 2024-02-13: 60 mg via INTRAVENOUS

## 2024-02-13 MED ORDER — ORAL CARE MOUTH RINSE: 15.0000 mL | Freq: Once | OROMUCOSAL | Status: DC

## 2024-02-13 MED ORDER — KETAMINE HCL 50 MG/5ML IJ SOSY
PREFILLED_SYRINGE | INTRAMUSCULAR | Status: DC | PRN
Start: 2024-02-13 — End: 2024-02-13
  Administered 2024-02-13: 10 mg via INTRAVENOUS

## 2024-02-13 MED ORDER — LIDOCAINE HCL 1 % IJ SOLN
INTRAMUSCULAR | Status: DC | PRN
  Administered 2024-02-13: 15 mL

## 2024-02-13 MED ORDER — BACITRACIN ZINC 500 UNIT/GM EX OINT
TOPICAL_OINTMENT | CUTANEOUS | Status: DC | PRN
  Administered 2024-02-13: 1 via TOPICAL

## 2024-02-13 MED ORDER — VANCOMYCIN HCL IN DEXTROSE 1-5 GM/200ML-% IV SOLN
1000.0000 mg | Freq: Once | INTRAVENOUS | Status: AC
  Administered 2024-02-13: 1000 mg via INTRAVENOUS
  Filled 2024-02-13: qty 200

## 2024-02-13 MED ORDER — KETAMINE HCL 50 MG/5ML IJ SOSY
PREFILLED_SYRINGE | INTRAMUSCULAR | Status: AC
  Filled 2024-02-13: qty 5

## 2024-02-13 MED ORDER — CHLORHEXIDINE GLUCONATE 0.12 % MT SOLN
OROMUCOSAL | Status: AC
  Filled 2024-02-13: qty 15

## 2024-02-13 MED ORDER — CHLORHEXIDINE GLUCONATE 0.12 % MT SOLN
15.0000 mL | Freq: Once | OROMUCOSAL | Status: AC
  Administered 2024-02-13: 15 mL via OROMUCOSAL

## 2024-02-13 MED ORDER — ACETAMINOPHEN 10 MG/ML IV SOLN
INTRAVENOUS | Status: DC | PRN
  Administered 2024-02-13: 1000 mg via INTRAVENOUS

## 2024-02-13 MED ORDER — FENTANYL CITRATE (PF) 100 MCG/2ML IJ SOLN
INTRAMUSCULAR | Status: AC
  Filled 2024-02-13: qty 2

## 2024-02-13 MED ORDER — LIDOCAINE HCL (PF) 1 % IJ SOLN
INTRAMUSCULAR | Status: AC
  Filled 2024-02-13: qty 30

## 2024-02-13 MED ORDER — EPHEDRINE 5 MG/ML INJ
INTRAVENOUS | Status: AC
  Filled 2024-02-13: qty 10

## 2024-02-13 MED ORDER — BUPIVACAINE HCL (PF) 0.25 % IJ SOLN
INTRAMUSCULAR | Status: AC
  Filled 2024-02-13: qty 30

## 2024-02-13 MED FILL — Medication: INTRATHECAL | Qty: 1 | Status: AC

## 2024-02-13 SURGICAL SUPPLY — 40 items
BENZOIN TINCTURE PRP APPL 2/3 (GAUZE/BANDAGES/DRESSINGS) ×1 IMPLANT
BINDER ABD UNIV 12 45-62 (WOUND CARE) ×1 IMPLANT
BLADE SURG 15 STRL LF DISP TIS (BLADE) ×1 IMPLANT
CLAMP SUTURE YELLOW 5 PAIRS (MISCELLANEOUS) ×1 IMPLANT
CNTNR URN SCR LID CUP LEK RST (MISCELLANEOUS) ×1 IMPLANT
DRAPE C-ARM XRAY 36X54 (DRAPES) ×1 IMPLANT
DRAPE INCISE IOBAN 66X45 STRL (DRAPES) ×1 IMPLANT
DRSG TEGADERM 4X4.75 (GAUZE/BANDAGES/DRESSINGS) ×2 IMPLANT
DRSG TELFA 3X8 NADH STRL (GAUZE/BANDAGES/DRESSINGS) ×2 IMPLANT
DURAPREP 26ML APPLICATOR (WOUND CARE) ×1 IMPLANT
ELECTRODE REM PT RTRN 9FT ADLT (ELECTROSURGICAL) ×1 IMPLANT
GAUZE 4X4 16PLY ~~LOC~~+RFID DBL (SPONGE) ×1 IMPLANT
GLOVE BIO SURGEON STRL SZ8 (GLOVE) ×1 IMPLANT
GOWN STRL REUS W/ TWL LRG LVL3 (GOWN DISPOSABLE) ×1 IMPLANT
GOWN STRL REUS W/ TWL XL LVL3 (GOWN DISPOSABLE) ×1 IMPLANT
KIT REFILL CATH SYNCHROMED II (MISCELLANEOUS) IMPLANT
KIT REVIS PUMP CNNECTR SUTLESS (CATHETERS) IMPLANT
KIT TURNOVER KIT A (KITS) ×1 IMPLANT
LABEL OR SOLS (LABEL) ×1 IMPLANT
MANIFOLD NEPTUNE II (INSTRUMENTS) ×1 IMPLANT
NDL HYPO 25X1 1.5 SAFETY (NEEDLE) ×2 IMPLANT
NDL SPNL 22GX3.5 QUINCKE BK (NEEDLE) ×1 IMPLANT
NEEDLE HYPO 25X1 1.5 SAFETY (NEEDLE) ×2 IMPLANT
NEEDLE SPNL 22GX3.5 QUINCKE BK (NEEDLE) ×1 IMPLANT
PACK BASIN MINOR ARMC (MISCELLANEOUS) ×1 IMPLANT
PACK UNIVERSAL (MISCELLANEOUS) ×1 IMPLANT
POUCH TYRX ANTIBAC NEURO LRG (Mesh General) IMPLANT
PUMP SYNCHROMED III 40ML RESVR (Neuro Prosthesis/Implant) IMPLANT
SET SPINAL AND EPIDURAL ANES (NEEDLE) ×1 IMPLANT
SOLUTION PREP PVP 2OZ (MISCELLANEOUS) ×1 IMPLANT
SPIKE FLUID TRANSFER (MISCELLANEOUS) ×3 IMPLANT
STAPLER SKIN PROX 35W (STAPLE) ×1 IMPLANT
SUT SILK 0 SH 30 (SUTURE) ×1 IMPLANT
SUT SILK 2 0 SH (SUTURE) ×4 IMPLANT
SUT VIC AB 2-0 SH 27XBRD (SUTURE) ×2 IMPLANT
SYR 10ML LL (SYRINGE) ×2 IMPLANT
SYR EPIDURAL 5ML GLASS (SYRINGE) ×1 IMPLANT
SYR TB 1ML LL NO SAFETY (SYRINGE) ×1 IMPLANT
TRAP FLUID SMOKE EVACUATOR (MISCELLANEOUS) ×1 IMPLANT
WATER STERILE IRR 500ML POUR (IV SOLUTION) ×1 IMPLANT

## 2024-02-13 NOTE — Interval H&P Note (Signed)
 Patient's Name: Tiffany Hunter  MRN: [id number]  Referring Provider: Tanya Glisson, MD  DOB: 04-04-1946  PCP: Neita Dover, PA-C  DOS: 12/19/2023  Note by: Glisson DELENA Tanya, MD  Service setting: Outpatient Same Day Surgery  Specialty: Interventional Pain Management  Patient type: Established  Location: [facility]  Encounter type: Pre-operative Evaluation   Admission Date & Time: 02/13/2024  6:02 AM  History and Physical Interval Note:  Tiffany Hunter has presented today for surgery, with the diagnosis of : <principal problem not specified>.  I have reviewed the patient's chart, labs, images, history and physical exam. No new additional findings. No change in status. Tiffany Hunter is stable for surgery. The various alternatives of treatment have been discussed with the patient and family. The patient has been informed of the risks and possible complications. After consideration of risks, benefits and other options for treatment, the patient has consented to Procedure(s): PAIN PUMP INSERTION: 62360 (CPT) , as a surgical intervention . All questions have been answered to the patient's satisfaction.  Plan: Proceed with surgery as planned.  OR Scheduled Time: 0715  Note by: Glisson DELENA Tanya, MD Date: 12/19/2023; Time: 6:33 AM

## 2024-02-13 NOTE — OR Nursing (Signed)
 Pain pump was filled with hydromorphone 8mg /sufentanil (as sufentanil citrate) 70mcg/ml pf inj during surgery. This medication was received from pharmacy at 7:10 prior to patient arriving in the OR. This medication was compounded through [employer] per Dr. Marilyne order.

## 2024-02-13 NOTE — Brief Op Note (Signed)
 Patient: Tiffany Hunter  (78 y.o. female)  Date: 02/13/2024  Time: 9:19 AM  Diagnosis Pre-op: BACK PAIN; PUMP END OF LIFE Post-op: BACK PAIN; PUMP END OF LIFE Procedure: PAIN PUMP REPLACEMENT: 62360 (CPT)  Operating Room: [facility] OR ROOM 05  Surgeon: Tanya Glisson, MD   Assistant(s): None  Anesthesia: General  Anesthesia Staff:  Anesthesiologist: Dario Barter, MD CRNA: Gillermo Spruce I, CRNA EBL:  Minimal  Blood Administered: None Drains: None Meds ordered this encounter  Medications   vancomycin  (VANCOCIN ) IVPB 1000 mg/200 mL premix    Pediatric Recommended Dose: 15 mg/kg    Indication::   Other Indication (list below)    Other Indication::   Procedure Prophylaxis   OR Linked Order Group    chlorhexidine  (PERIDEX ) 0.12 % solution 15 mL    Oral care mouth rinse   lactated ringers  infusion   OR Linked Order Group    chlorhexidine  (PERIDEX ) 0.12 % solution 15 mL    Oral care mouth rinse   0.9 % irrigation (POUR BTL)   fentaNYL  (SUBLIMAZE ) injection 25-50 mcg   droperidol (INAPSINE) 2.5 MG/ML injection 0.625 mg   bupivacaine  (MARCAINE ) 30 mL, lidocaine  (XYLOCAINE ) 1 % 30 mL   bacitracin ointment   Specimen & disposition: * No specimens in log * Complete Count: YES Tourniquet: * No tourniquets in log *  Dictation: Note written in EPIC Plan of Care: Discharge to 01-Home or Self Care after PACU  Patient Disposition: F/U at Pain Clinic as outpatient. Implants     Mesh General     Pouch Tyrx Antibac Neuro Lrg - [id number] - Implanted   (Left) Abdomen    Inventory item: POUCH JUDITHE [facility] NEURO LRG Model/Cat number: [id number]   Manufacturer: [employer]TR[employer] NEUROMOD PAIN MGMT Lot number: [id number]    As of 02/13/2024     Status: Implanted                     Neuro Prosthesis/Implant     Pump Synchromed 2 - [id number] - Implanted   (Left) Abdomen    Inventory item: PUMP SYNCHROMED II RESVR Model/Cat number: [id number]   Serial number: [id number]  Manufacturer: MEDTRONIC NEUROMOD PAIN MGMT    As of 07/07/2017     Status: Implanted                Pump Synchromed III 40ml Resvr - [id number] - Implanted   (Left) Abdomen    Inventory item: PUMP SYNCHROMED III RESVR Model/Cat number: [id number]   Serial number: [id number] Manufacturer: MEDTRONIC NEUROMOD PAIN MGMT    As of 02/13/2024     Status: Implanted

## 2024-02-13 NOTE — Anesthesia Preprocedure Evaluation (Signed)
 Anesthesia Evaluation  Patient identified by MRN, date of birth, ID band Patient awake    Reviewed: Allergy & Precautions, H&P , NPO status , Patient's Chart, lab work & pertinent test results, reviewed documented beta blocker date and time   History of Anesthesia Complications Negative for: history of anesthetic complications  Airway Mallampati: II  TM Distance: >3 FB Neck ROM: full    Dental  (+) Edentulous Upper, Edentulous Lower   Pulmonary shortness of breath and with exertion, asthma , neg sleep apnea, neg COPD, neg recent URI   Pulmonary exam normal breath sounds clear to auscultation       Cardiovascular Exercise Tolerance: Good hypertension, (-) angina + CAD  (-) Past MI and (-) Cardiac Stents Normal cardiovascular exam(-) dysrhythmias + Valvular Problems/Murmurs  Rhythm:regular Rate:Normal     Neuro/Psych  Headaches, neg Seizures  Neuromuscular disease  negative psych ROS   GI/Hepatic Neg liver ROS, hiatal hernia,GERD  ,,  Endo/Other  diabetes    Renal/GU Renal disease (CKD stage 2)  negative genitourinary   Musculoskeletal   Abdominal   Peds  Hematology negative hematology ROS (+)   Anesthesia Other Findings Past Medical History: No date: Abnormal EKG No date: Aortic atherosclerosis No date: Arthritis No date: Asthma 11/20/2012: Backhand tennis elbow No date: Barrett's esophagus No date: CAD (coronary artery disease)     Comment:  Myoview 4/12: inf and apical reversible defect               concerning for ischemia;    cath 8/12: pLAD with mild Ca               but no sig stenosis; pRCA 30%, mRCA 20%, dRCA 30% and               mild diffuse plaquing throughout the PDA and PL system;               EF 60-65%; normal right heart pressures No date: Cancer (HCC)     Comment:  ovarian No date: Carotid artery bruit No date: Cataract No date: Chest wall pain No date: Cirrhosis of liver (HCC) No date:  CKD (chronic kidney disease), stage II No date: Colonic polyp 01/21/2014: Difficult or painful urination No date: Diverticulosis No date: Exogenous obesity No date: GERD (gastroesophageal reflux disease) No date: H/O: hysterectomy No date: Headache     Comment:  sometime No date: Heart murmur No date: History of hiatal hernia No date: Hyperlipidemia No date: Hypertension No date: Knee pain     Comment:  receiving knee injections in the left knee from               [facility] No date: Lesion of pancreas No date: Low back pain 05/15/2015: Menopausal symptom No date: Osteoporosis No date: Ovarian failure No date: Pneumonia 03/20/2015: Presence of functional implant ([employer]tronic programmable  intrathecal pump)     Comment:  Currently being managed by Dr. Eric Como at the               [facility]  No date: Sinusitis No date: Weakness   Reproductive/Obstetrics negative OB ROS                              Anesthesia Physical Anesthesia Plan  ASA: 3  Anesthesia Plan: General   Post-op Pain Management:    Induction: Intravenous  PONV Risk Score and Plan: 3 and Propofol  infusion,  TIVA and Treatment may vary due to age or medical condition  Airway Management Planned: Simple Face Mask  Additional Equipment:   Intra-op Plan:   Post-operative Plan:   Informed Consent: I have reviewed the patients History and Physical, chart, labs and discussed the procedure including the risks, benefits and alternatives for the proposed anesthesia with the patient or authorized representative who has indicated his/her understanding and acceptance.     Dental Advisory Given  Plan Discussed with: Anesthesiologist, CRNA and Surgeon  Anesthesia Plan Comments:         Anesthesia Quick Evaluation

## 2024-02-13 NOTE — OR Nursing (Signed)
 Previous pain pump removed, remaining pain medication aspirated. 15 ml hydromorphone/sufentanil removed and wasted in black med box in OR 5. Olam Lot, RN and Allean Chess, RN as witness.

## 2024-02-13 NOTE — Transfer of Care (Signed)
 Immediate Anesthesia Transfer of Care Note  Patient: Tiffany Hunter  Procedure(s) Performed: PAIN PUMP REPLACEMENT  Patient Location: PACU  Anesthesia Type:MAC  Level of Consciousness: sedated  Airway & Oxygen Therapy: Patient Spontanous Breathing and Patient connected to face mask oxygen  Post-op Assessment: Report given to RN and Post -op Vital signs reviewed and stable  Post vital signs: stable  Last Vitals:  Vitals Value Taken Time  BP 129/68 02/13/24 09:24  Temp 36.2 C 02/13/24 09:24  Pulse 81 02/13/24 09:26  Resp 12 02/13/24 09:26  SpO2 100 % 02/13/24 09:26  Vitals shown include unfiled device data.  Last Pain:  Vitals:   02/13/24 0615  TempSrc: Temporal  PainSc: 0-No pain         Complications: No notable events documented.

## 2024-02-13 NOTE — Op Note (Addendum)
 PROVIDER NOTE: Information contained herein reflects review and annotations entered in association with encounter. Interpretation of such information and data should be left to medically-trained personnel. Information provided to patient can be located elsewhere in the medical record under Patient Instructions. Document created using STT-dictation technology, any transcriptional errors that may result from process are unintentional.    Patient: Tiffany Hunter  Service Category: Surgery  Provider: Eric DELENA Como, MD  DOB: 11/03/1945  DOS: 12/19/2023  Location: [facility]  MRN: [id number]  Setting: Ambulatory - outpatient  Referring Provider: Como Eric, MD  Type: Established Patient  Specialty: Interventional Pain Management  PCP: Neita Dover, PA-C   Operative Note    Planned intervention: Intrathecal programmable pump revision and replacement Pre-op Dx: End of battery life for intrathecal pump Procedure: PAIN PUMP REPLACEMENT: 62360 (CPT)   Post-op Dx: End of battery life for intrathecal pump Pump laterality: Left (-LT)  Old reservoir size: 40 mL  New reservoir size: 40 mL  Imaging: None required DOS: 02/13/2024  Time: 9:28 AM Operating Room: [facility] OR ROOM 05  Surgeon: Como Eric, MD  Assistant(s): None  Anesthesia: General  Anesthesia Staff: Anesthesiologist: Dario Barter, MD CRNA: Gillermo Spruce I, CRNA OR Staff: Circulator: Merriam Laurel, RN Scrub Person: Kattie, Racheal T Vendor Representative : Carolee Shaver EBL: 2 mL Blood Administered: None Drains: None Specimen & disposition: * No specimens in log * Complete Count: YES Tourniquet: None Performed by: Eric DELENA Como, MD    Target: Intrathecal pump reservoir Location: Abdominal Region: LLQ  Approach: Surgical Type of procedure: Implant surgery  Position / Prep / Materials:  Position: Supine  Prep solution: ChloraPrep (2% chlorhexidine  gluconate and 70%  isopropyl alcohol) Prep Area: Entire abdominal region  Materials:  Tray: Surgical implant + implant equipment Needle(s):  Type: Spinal  Gauge (G): 22  Length: 3.5-in  Qty: 1  Pre-Procedure Preparation:  Monitoring: As per Anesthesia protocol. Respiration, ETCO2, SpO2, BP, heart rate and rhythm monitor placed and checked for adequate function Safety Precautions: Patient was assessed for positional comfort and pressure points before starting the procedure. Time-out: I initiated and conducted the Time-out before starting the procedure, as per protocol. The patient was asked to participate by confirming the accuracy of the Time Out information. Verification of the correct person, site, and procedure were performed and confirmed by me, the nursing staff, and the patient. Time-out conducted as per Joint Commission's Universal Protocol (UP.01.01.01). Admit time: 02/13/2024  6:02 AM  H&P (Pre-op Assessment):  Tiffany Hunter is a 78 y.o. (year old), female patient, seen today for interventional treatment. She  has a past surgical history that includes Carpal tunnel release (Bilateral); Back surgery; Breast surgery (Bilateral); Cholecystectomy; Colonoscopy w/ polypectomy; Cataract extraction w/ intraocular lens implant (Right); Abdominal hysterectomy; Fracture surgery (Right); Cardiac catheterization (2012); Pain pump revision (Left, 07/07/2017); and Intrathecal pump implant. Tiffany Hunter @[facility] @ Her primarily concern today is the No chief complaint on file.  Initial Vital Signs:  Pulse/HCG Rate: 69ECG Heart Rate: 68 Temp: 97.9 F (36.6 C) Resp: 14 BP: (!) 175/76 SpO2: 99 %  BMI: Estimated body mass index is 29.54 kg/m as calculated from the following:   Height as of this encounter: 5' 2 (1.575 m).   Weight as of this encounter: 73.3 kg.  Risk Assessment: Allergies: Reviewed. She is allergic to penicillin g, doxycycline hyclate, and sulfa antibiotics.  Allergy Precautions: None  required Coagulopathies: Reviewed. None identified.  Blood-thinner therapy: None at this time Active Infection(s): Reviewed. None identified. Ms.  Hunter is afebrile  Site Confirmation: Tiffany Hunter was asked to confirm the procedure and laterality before marking the site Procedure checklist: Completed Consent: Before the procedure and under the influence of no sedative(s), amnesic(s), or anxiolytics, the patient was informed of the treatment options, risks and possible complications. To fulfill our ethical and legal obligations, as recommended by the American Medical Association's Code of Ethics, I have informed the patient of my clinical impression; the nature and purpose of the treatment or procedure; the risks, benefits, and possible complications of the intervention; the alternatives, including doing nothing; the risk(s) and benefit(s) of the alternative treatment(s) or procedure(s); and the risk(s) and benefit(s) of doing nothing. The patient was provided information about the general risks and possible complications associated with the procedure. These may include, but are not limited to: failure to achieve desired goals, infection, bleeding, organ or nerve damage, allergic reactions, paralysis, and death. In addition, the patient was informed of those risks and complications associated to Spine-related procedures, such as failure to decrease pain; infection (i.e.: Meningitis, epidural or intraspinal abscess); bleeding (i.e.: epidural hematoma, subarachnoid hemorrhage, or any other type of intraspinal or peri-dural bleeding); organ or nerve damage (i.e.: Any type of peripheral nerve, nerve root, or spinal cord injury) with subsequent damage to sensory, motor, and/or autonomic systems, resulting in permanent pain, numbness, and/or weakness of one or several areas of the body; allergic reactions; (i.e.: anaphylactic reaction); and/or death. Furthermore, the patient was informed of those risks and  complications associated with the medications. These include, but are not limited to: allergic reactions (i.e.: anaphylactic or anaphylactoid reaction(s)); adrenal axis suppression; blood sugar elevation that in diabetics may result in ketoacidosis or comma; water retention that in patients with history of congestive heart failure may result in shortness of breath, pulmonary edema, and decompensation with resultant heart failure; weight gain; swelling or edema; medication-induced neural toxicity; particulate matter embolism and blood vessel occlusion with resultant organ, and/or nervous system infarction; and/or aseptic necrosis of one or more joints. Finally, the patient was informed that Medicine is not an exact science; therefore, there is also the possibility of unforeseen or unpredictable risks and/or possible complications that may result in a catastrophic outcome. The patient indicated having understood very clearly. We have given the patient no guarantees and we have made no promises. Enough time was given to the patient to ask questions, all of which were answered to the patient's satisfaction. Ms. Brocker has indicated that she wanted to continue with the procedure. Attestation: I, the ordering provider, attest that I have discussed with the patient the benefits, risks, side-effects, alternatives, likelihood of achieving goals, and potential problems during recovery for the procedure that I have provided informed consent. Date  Time: 02/13/2024  7:55 AM  Type of Imaging Technique: None used Indication(s): N/A Exposure Time: No patient exposure Contrast: None used. Fluoroscopic Guidance: N/A Ultrasound Guidance: N/A Interpretation: N/A  Description/Narrative of Procedure:  Description of procedure: The procedure site was prepped using a broad-spectrum topical antiseptic. The area was then draped in the usual and standard manner. Time-out was performed as per JC Universal Protocol  (UP.01.01.01).  Pump replacement implant: The old pump was identified and an incision made over the prior surgical scar. The anchors were dissected and removed. Care was taken not to damage the catheter. Meanwhile, the new pump was interrogated, programmed, prepped and primed with the desired medication. The catheter was then disconnected from the old pump and immediately connected to the new one, after having  aspirated and confirmed that it was still patent. The new pump was inserted into the pocket after having cleaned it. The excess catheter was place behind the pump and the pump was secured to the abdominal wall using 2-0 non-absorbable sutures. A suture was placed in all four corners of the pump, so as to prevent migration or flipping of the pump. The wound was checked for adequate hemostasis, irrigated, and cleaned prior to closure. The wound was approximated using 2-0 Vicryl. Great care was taken, so as to avoid puncturing the intrathecal catheter. The skin was closed using surgical staples. The wound was covered with a sterile gauze, for absorption, and a bio-occlusive dressing. An abdominal binder was placed to minimize fluid accumulation or development of a hematoma. The patient was then transferred to the post-anesthesia recovery unit, where final analysis and programming of the device was done. The patient tolerated the entire procedure well. A repeat set of vitals were taken after the procedure and the patient was kept under observation until discharge criteria was met. The patient was provided with discharge instructions, including a section on how to identify potential problems. Should any problems arise concerning this procedure, the patient was given instructions to immediately contact us , without hesitation. The [employer]'s representative and I, both provided the patient with our Business cards containing our contact telephone numbers, and instructed the patient to contact either one of us , at  any time, should there be any problems or questions. In any case, we plan to contact the patient by telephone for a follow-up status report regarding this interventional procedure.  Safety Precautions: Informed consent obtained. Patient allergies reviewed. Time-out was performed as per JC Universal Protocol (UP.01.01.01). Strict sterile technique kept at all times. Fluoroscopy guidance used for placement accuracy and confirmation. Continuous Anesthesia monitoring maintained throughout the entire case. Pressure points and patient comfort assessed during initial patient positioning. Meaningful verbal contact maintained at all times during the critical portions of the procedure. Aspiration looking for blood return was conducted prior to all injections. At no point did we inject any substances, as a needle was being advanced. No attempts were made at seeking any paresthesias. Safe injection practices and needle disposal techniques used. Medications properly checked for expiration dates. SDV (single dose vial) medications used. Adequate hemostasis attained before wound closure.  IDDS (Intrathecal Drug Delivery System):  Pump Reservoir  Battery:  Manufacturer: [employer] Model: Synchromed III Model No.: [id number]  Serial No.: [id number]  Delivery Route: Intrathecal  Type: Programmable  Volume Capacity (mL): 40 mL reservoir  Priming Volume: 0.209 mL  Calibration Constant: N/A  MRI compatibility: Conditional  Implant Site: Abdominal area, lateral to navel  Laterality: Left  Additional equipment: TYRX Antibacterial envelope (Minocycline/rifampin) used  Visiting Personnel: Visitor: Vendor - jessed villalba, [employer]   Vitals:   02/13/24 0615 02/13/24 0924 02/13/24 0930 02/13/24 1025  BP: (!) 175/76 129/68 131/72 (!) 160/73  Pulse: 69 68 80 63  Resp: 14 13 13 14   Temp: 97.9 F (36.6 C) (!) 97.2 F (36.2 C)  (!) 97.3 F (36.3 C)  TempSrc: Temporal   Temporal  SpO2: 99% 98% 100% 100%  Weight:  73.3 kg     Height: 5' 2 (1.575 m)       Scheduled time: 0715 Start time:  7:30 AM Surgical time: 1 Hr 52 Min 0 Sec End time: Wound 02/13/24 0910 Surgical Closed Surgical Incision Abdomen Left-Dressing Type: Non adherent, Benzoine (tegaderm, bacitracin, tegaderm)  Implants     Mesh General  Pouch Tyrx Antibac Neuro Lrg - [id number] - Implanted   (Left) Abdomen    Inventory item: POUCH JUDITHE [facility] NEURO LRG Model/Cat number: [id number]   Manufacturer: [employer] NEUROMOD PAIN MGMT Lot number: [id number]    As of 02/13/2024     Status: Implanted                     Neuro Prosthesis/Implant     Pump Synchromed 2 - [id number] - Implanted   (Left) Abdomen    Inventory item: PUMP SYNCHROMED II RESVR Model/Cat number: [id number]   Serial number: [id number] Manufacturer: [employer] NEUROMOD PAIN MGMT    As of 07/07/2017     Status: Implanted                Pump Synchromed III 40ml Resvr - [id number] - Implanted   (Left) Abdomen    Inventory item: PUMP SYNCHROMED III RESVR Model/Cat number: [id number]   Serial number: [id number] Manufacturer: [employer] NEUROMOD PAIN MGMT    As of 02/13/2024     Status: Implanted                        Medication(s): Administrations occurring from 0529 to 0921 on 02/13/24:  Medication Name Total Dose  0.9 % irrigation (POUR BTL) 500 mL  bacitracin ointment 1 Application  bupivacaine  (MARCAINE ) 30 mL, lidocaine  (XYLOCAINE ) 1 % 30 mL 15 mL  lactated ringers  infusion Cannot be calculated  vancomycin  (VANCOCIN ) IVPB 1000 mg/200 mL premix 1,000 mg   Antibiotic Prophylaxis:   Antibiotics Given (last 72 hours)     Date/Time Action Medication Dose   02/13/24 0723 Given   vancomycin  (VANCOCIN ) IVPB 1000 mg/200 mL premix 1,000 mg      Indication(s): None identified  Post-procedure Assessment:  EBL: None  Complications: None. Procedure was completely uneventful. * No complications entered in OR log *  Note: The  patient tolerated the entire procedure well. A repeat set of vitals were taken after the procedure. No post-procedural neurological deterioration observed. The patient was provided with discharge instructions, including how to identify potential problems. Should any problems arise concerning this procedure, the patient was given instructions to immediately contact us , at any time, without hesitation. Comments:  No additional relevant information.  Date: 12/19/2023; Time:   hrs  Plan of Care  Disposition: Discharge home under the care of a responsible, capable, adult driver. Return to clinics in 6-10 days for post-procedure evaluation and removal of staples.  Discharge (Date  Time): 02/13/2024 10:41 AM  Primary Care Physician: Neita Dover, PA-C Location: [facility] Note by: Eric DELENA Como, MD (TTS technology used. I apologize for any typographical errors that were not detected and corrected.)  Disclaimer:  Medicine is not an Visual merchandiser. The only guarantee in medicine is that nothing is guaranteed. It is important to note that the decision to proceed with this intervention was based on the information collected from the patient. The Data and conclusions were drawn from the patient's questionnaire, the interview, and the physical examination. Because the information was provided in large part by the patient, it cannot be guaranteed that it has not been purposely or unconsciously manipulated. Every effort has been made to obtain as much relevant data as possible for this evaluation. It is important to note that the conclusions that lead to this procedure are derived in large part from the available data. Always take into account that the treatment will also be  dependent on availability of resources and existing treatment guidelines, considered by other Pain Management Practitioners as being common knowledge and practice, at the time of the intervention. For  Medico-Legal purposes, it is also important to point out that variation in procedural techniques and pharmacological choices are the acceptable norm. The indications, contraindications, technique, and results of the above procedure should only be interpreted and judged by a Board-Certified Interventional Pain Specialist with extensive familiarity and expertise in the same exact procedure and technique.

## 2024-02-14 ENCOUNTER — Telehealth: Payer: Self-pay | Admitting: Pain Medicine

## 2024-02-14 ENCOUNTER — Encounter: Payer: Self-pay | Admitting: Pain Medicine

## 2024-02-14 ENCOUNTER — Other Ambulatory Visit: Payer: Self-pay

## 2024-02-14 ENCOUNTER — Other Ambulatory Visit: Payer: Self-pay | Admitting: Pain Medicine

## 2024-02-14 DIAGNOSIS — G8918 Other acute postprocedural pain: Secondary | ICD-10-CM

## 2024-02-14 DIAGNOSIS — Z792 Long term (current) use of antibiotics: Secondary | ICD-10-CM

## 2024-02-14 MED ORDER — CLARITHROMYCIN 500 MG PO TABS: 500.0000 mg | ORAL_TABLET | Freq: Two times a day (BID) | ORAL | 0 refills | Status: AC

## 2024-02-14 MED ORDER — TRAMADOL HCL 50 MG PO TABS: 50.0000 mg | ORAL_TABLET | Freq: Three times a day (TID) | ORAL | 0 refills | Status: AC | PRN

## 2024-02-14 NOTE — Anesthesia Postprocedure Evaluation (Signed)
 Anesthesia Post Note  Patient: Tiffany Hunter  Procedure(s) Performed: PAIN PUMP REPLACEMENT  Patient location during evaluation: PACU Anesthesia Type: General Level of consciousness: awake and alert Pain management: pain level controlled Vital Signs Assessment: post-procedure vital signs reviewed and stable Respiratory status: spontaneous breathing, nonlabored ventilation, respiratory function stable and patient connected to nasal cannula oxygen Cardiovascular status: blood pressure returned to baseline and stable Postop Assessment: no apparent nausea or vomiting Anesthetic complications: no   No notable events documented.   Last Vitals:  Vitals:   02/13/24 0930 02/13/24 1025  BP: 131/72 (!) 160/73  Pulse: 80 63  Resp: 13 14  Temp:  (!) 36.3 C  SpO2: 100% 100%    Last Pain:  Vitals:   02/14/24 1205  TempSrc:   PainSc: 0-No pain                 Prentice Murphy

## 2024-02-14 NOTE — Telephone Encounter (Signed)
 Son called stating patient had surgery yesterday and Dr Tanya was going to call in some medications. Pharmacy states nothing has been sent in. Please advise patients's son Tiffany Hunter

## 2024-02-14 NOTE — Telephone Encounter (Signed)
 Spoke to Dr Tanya and he states that the medication in the pump should cover the post op pain. Son notified.

## 2024-02-21 ENCOUNTER — Ambulatory Visit: Admitting: Pain Medicine

## 2024-02-27 NOTE — Progress Notes (Unsigned)
 PROVIDER NOTE: Interpretation of information contained herein should be left to medically-trained personnel. Specific patient instructions are provided elsewhere under Patient Instructions section of medical record. This document was created in part using AI and STT-dictation technology, any transcriptional errors that may result from this process are unintentional.  Patient: Tiffany Hunter  Service: E/M   PCP: Neita Dover, PA-C  DOB: 1946/04/01  DOS: 02/28/2024  Provider: Eric DELENA Como, MD  MRN: [id number]  Delivery: Face-to-face  Specialty: Interventional Pain Management  Type: Established Patient  Setting: Ambulatory outpatient facility  Specialty designation: 09  Referring Prov.: Neita Dover, PA-C  Location: Outpatient office facility       History of present illness (HPI) [name], a 78 y.o. year old female, is here today because of her Chronic left-sided low back pain with left-sided sciatica [M54.42, G89.29]. [name] primary complain today is No chief complaint on file.  Pertinent problems: 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); Chronic lumbar radicular pain (L4 Dermatome) (Left); Coccygodynia; Chronic hip pain (Left); Osteoarthritis of hip (Left); Chronic knee pain (Left); Osteoarthritis of knee (Left); Thoracic bulging disc without myelopathy; Generalized weakness; Chronic lower extremity pain (2ry area of Pain) (Left); Chronic sacroiliac joint pain (3ry area of Pain) (Left); Generalized abdominal pain; Right elbow pain; Lumbar facet joint syndrome (Left); End of battery life of intrathecal infusion pump; Spondylosis without myelopathy or  radiculopathy, lumbosacral region; Chronic low back pain (Left) w/o sciatica; Osteoarthritis of lumbar spine; Hyperalgesia; Shoulder arthritis; H/O total shoulder replacement, left; Spondylosis of lumbar spine; Pancreatic mass; Chronic lower extremity pain (Right); Chronic low back pain (Bilateral) w/ sciatica (Right); Chronic lower extremity radicular pain (S1) (Right); Chronic low back pain (Bilateral) w/o sciatica; Lumbar facet joint pain; Lumbar facet arthropathy (Multilevel) (Bilateral); DDD (degenerative disc disease), lumbosacral w/ LBP & LEP; Encounter for adjustment and management of infusion pump; Encounter for interrogation of infusion pump; and Acute pain of right shoulder on their pertinent problem list.  Pain Assessment: Severity of   is reported as a  /10. Location:    / . Onset:  . Quality:  . Timing:  . Modifying factor(s):  [name] Vitals:  vitals were not taken for this visit.  BMI: Estimated body mass index is 29.54 kg/m as calculated from the following:   Height as of 02/13/24: 5' 2 (1.575 m).   Weight as of 02/13/24: 161 lb 8 oz (73.3 kg).  Last encounter: 01/29/2024. Last procedure: 12/19/2023.  Reason for encounter: post-procedure evaluation and assessment.   Discussed the use of AI scribe software for clinical note transcription with the patient, who gave verbal consent to proceed.  History of Present Illness           Pharmacotherapy Assessment   Opioid Analgesic: No other oral opioid analgesics prescribed by our practice. Intrathecal PF-Hydromorphone 3.9295 mg/day + PF-Sufentanil 34.383 mcg/day. MME: [employer]. 58.7 mg/day.     Monitoring: [place] PMP: PDMP reviewed during this encounter.       Pharmacotherapy: No side-effects or adverse reactions reported. Compliance: No problems identified. Effectiveness: Clinically acceptable.  No notes on file  UDS:  Summary  Date Value Ref Range Status  04/26/2023 FINAL  Final    Comment:     ==================================================================== ToxASSURE Select 13 (MW) ====================================================================  Specimen Alert ToxAssure, ToxAssure FLEX or MAT drug testing: -Technical component- Result certification performed at [facility], [address] [place], [place] [address]. [phone] Lab Director Arnulfo Finder, PhrmD. ==================================================================== Test                             Result       Flag       Units  Drug Present and Declared for Prescription Verification   Hydromorphone                  2187         EXPECTED   ng/mg creat    Hydromorphone may be administered as a scheduled prescription    medication; it is also an expected metabolite of hydrocodone.  Drug Absent but Declared for Prescription Verification   Sufentanil                     Not Detected UNEXPECTED ng/mg creat ==================================================================== Test                      Result    Flag   Units      Ref Range   Creatinine              83               mg/dL      >=79 ==================================================================== Declared Medications:  The flagging and interpretation on this report are based on the  following declared medications.  Unexpected results may arise from  inaccuracies in the declared medications.   **Note: The testing scope of this panel includes these medications:   Hydromorphone  Sufentanil   **Note: The testing scope of this panel does not include the  following reported medications:   Albuterol (Ventolin HFA)  Atorvastatin (Lipitor)  Carvedilol (Coreg)  Celecoxib (Celebrex)  Diethylpropion  Losartan (Cozaar)  Meloxicam (Mobic)  Naloxegol  (Movantik )  Naloxone  (Narcan )  Omeprazole (Prilosec)  Oxybutynin (Ditropan)  Sulfadiazine (SSD) ==================================================================== For clinical  consultation, please call [phone]. ====================================================================     No results found for: CBDTHCR No results found for: D8THCCBX No results found for: D9THCCBX  ROS  Constitutional: Denies any fever or chills Gastrointestinal: No reported hemesis, hematochezia, vomiting, or acute GI distress Musculoskeletal: Denies any acute onset joint swelling, redness, loss of ROM, or weakness Neurological: No reported episodes of acute onset apraxia, aphasia, dysarthria, agnosia, amnesia, paralysis, loss of coordination, or loss of consciousness  Medication Review  NON FORMULARY, albuterol, ascorbic acid, atorvastatin, cephALEXin, clarithromycin, diphenhydrAMINE, lidocaine , losartan, naloxegol  oxalate, naloxone , omeprazole, and traMADol  History Review  Allergy: Ms. Kallen is allergic to penicillin g, doxycycline hyclate, and sulfa antibiotics. Drug: Ms. Cathy  reports no history of drug use. Alcohol:  reports no history of alcohol use. Tobacco:  reports that she has never smoked. She has never used smokeless tobacco. Social: Ms. Ismael  reports that she has never smoked. She has never used smokeless tobacco. She reports that she does not drink alcohol and does not use drugs. Medical:  has a past medical history of Abnormal EKG, Aortic atherosclerosis, Arthritis, Asthma, Backhand tennis elbow (11/20/2012), Barrett's esophagus, CAD (coronary artery disease), Cancer (HCC), Carotid artery bruit, Cataract, Chest wall pain, Cirrhosis of liver (HCC), CKD (chronic kidney disease), stage II, Colonic polyp, Difficult or painful urination (01/21/2014), Diverticulosis, Exogenous obesity, GERD (gastroesophageal reflux disease), H/O: hysterectomy, Headache, Heart murmur, History of  hiatal hernia, Hyperlipidemia, Hypertension, Knee pain, Lesion of pancreas, Low back pain, Menopausal symptom (05/15/2015), Osteoporosis, Ovarian failure, Pneumonia, Presence of  functional implant (Medtronic programmable intrathecal pump) (03/20/2015), Sinusitis, and Weakness. Surgical: Ms. Donlan  has a past surgical history that includes Carpal tunnel release (Bilateral); Back surgery; Breast surgery (Bilateral); Cholecystectomy; Colonoscopy w/ polypectomy; Cataract extraction w/ intraocular lens implant (Right); Abdominal hysterectomy; Fracture surgery (Right); Cardiac catheterization (2012); Pain pump revision (Left, 07/07/2017); Intrathecal pump implant; and Pain pump implantation (N/A, 02/13/2024). Family: family history includes Cancer in her mother; Colon cancer in an other family member; Coronary artery disease in an other family member; Heart disease in her father.  Laboratory Chemistry Profile   Renal Lab Results  Component Value Date   BUN 9 01/29/2024   CREATININE 0.66 01/29/2024   BCR 14 01/29/2024   GFR 113.32 12/16/2010   GFRAA 107 07/30/2019   GFRNONAA 93 07/30/2019    Hepatic Lab Results  Component Value Date   AST 21 01/29/2024   ALT 23 10/29/2015   ALBUMIN 4.4 01/29/2024   ALKPHOS 102 01/29/2024    Electrolytes Lab Results  Component Value Date   NA 141 01/29/2024   K 4.3 01/29/2024   CL 100 01/29/2024   CALCIUM 9.6 01/29/2024   MG 1.7 07/30/2019    Bone Lab Results  Component Value Date   25OHVITD1 42 07/30/2019   25OHVITD2 1.1 07/30/2019   25OHVITD3 41 07/30/2019    Inflammation (CRP: Acute Phase) (ESR: Chronic Phase) Lab Results  Component Value Date   CRP 12 (H) 07/30/2019   ESRSEDRATE 18 07/30/2019         Note: Above Lab results reviewed.  Recent Imaging Review  DG PAIN CLINIC C-ARM 1-60 MIN NO REPORT Fluoro was used, but no Radiologist interpretation will be provided.  Please refer to NOTES tab for provider progress note. Note: Reviewed        Physical Exam  Vitals: There were no vitals taken for this visit. BMI: Estimated body mass index is 29.54 kg/m as calculated from the following:   Height as of  02/13/24: 5' 2 (1.575 m).   Weight as of 02/13/24: 161 lb 8 oz (73.3 kg). Ideal: Patient weight not recorded General appearance: Well nourished, well developed, and well hydrated. In no apparent acute distress Mental status: Alert, oriented x 3 (person, place, & time)       Respiratory: No evidence of acute respiratory distress Eyes: PERLA   Assessment   Diagnosis Status  1. Chronic low back pain (1ry area of Pain) (Left) w/ sciatica (Left)   2. 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 Postop check    Controlled Controlled Controlled   Updated Problems: No problems updated.  Plan of Care  Problem-specific:  Assessment and Plan            Ms. Murry Khiev has a current medication list which includes the following long-term medication(s): albuterol, clarithromycin, diphenhydramine, losartan, naloxegol  oxalate, naloxone , and tramadol.  Pharmacotherapy (Medications Ordered): No orders of the defined types were placed in this encounter.  Orders:  No orders of the defined types were placed in this encounter.    Interventional Therapies  Risk  Complexity Considerations:   Presence of intrathecal pump  DM  CAD  Pulmonary hypertension     Planned  Pending:   Revision and replacement of Medtronic intrathecal pump.    Under consideration:   Diagnostic/therapeutic  bilateral lumbar (L4-5, L5-S1) facet MBB L3R1  Diagnostic left lumbar facet MBB #3  Possible left lumbar facet RFA #1   Completed:   Diagnostic/therapeutic right caudal ESI x1 (01/04/2022) (100/100/100/100) (of right leg pain)  Diagnostic left lumbar facet MBB x2 (12/22/2016) (07/14/2020) (100/100/100/100) (of left low back pain)    Therapeutic  Palliative (PRN) options:   Palliative intrathecal pump refills  Therapeutic/palliative left sided lumbar facet MBB #3       No follow-ups on file.    Recent Visits Date Type  Provider Dept  01/29/24 Office Visit Tanya Glisson, MD [facility]  12/19/23 Procedure visit Tanya Glisson, MD [facility]  Showing recent visits within past 90 days and meeting all other requirements Future Appointments Date Type Provider Dept  02/28/24 Appointment Tanya Glisson, MD [facility]  Showing future appointments within next 90 days and meeting all other requirements  I discussed the assessment and treatment plan with the patient. The patient was provided an opportunity to ask questions and all were answered. The patient agreed with the plan and demonstrated an understanding of the instructions.  Patient advised to call back or seek an in-person evaluation if the symptoms or condition worsens.  Duration of encounter: *** minutes.  Total time on encounter, as per AMA guidelines included both the face-to-face and non-face-to-face time personally spent by the physician and/or other qualified health care professional(s) on the day of the encounter (includes time in activities that require the physician or other qualified health care professional and does not include time in activities normally performed by clinical staff). Physician's time may include the following activities when performed: Preparing to see the patient (e.g., pre-charting review of records, searching for previously ordered imaging, lab work, and nerve conduction tests) Review of prior analgesic pharmacotherapies. Reviewing PMP Interpreting ordered tests (e.g., lab work, imaging, nerve conduction tests) Performing post-procedure evaluations, including interpretation of diagnostic procedures Obtaining and/or reviewing separately obtained history Performing a medically appropriate examination and/or evaluation Counseling and educating the patient/family/caregiver Ordering medications, tests, or procedures Referring and communicating with other health care professionals (when  not separately reported) Documenting clinical information in the electronic or other health record Independently interpreting results (not separately reported) and communicating results to the patient/ family/caregiver Care coordination (not separately reported)  Note by: Glisson DELENA Tanya, MD (TTS and AI technology used. I apologize for any typographical errors that were not detected and corrected.) Date: 02/28/2024; Time: 10:55 AM

## 2024-02-28 ENCOUNTER — Ambulatory Visit: Attending: Pain Medicine | Admitting: Pain Medicine

## 2024-02-28 ENCOUNTER — Encounter: Payer: Self-pay | Admitting: Pain Medicine

## 2024-02-28 VITALS — BP 147/60 | HR 69 | Temp 97.3°F | Ht 62.0 in | Wt 164.0 lb

## 2024-02-28 DIAGNOSIS — Z09 Encounter for follow-up examination after completed treatment for conditions other than malignant neoplasm: Secondary | ICD-10-CM | POA: Insufficient documentation

## 2024-02-28 DIAGNOSIS — Z978 Presence of other specified devices: Secondary | ICD-10-CM | POA: Diagnosis present

## 2024-02-28 DIAGNOSIS — G8929 Other chronic pain: Secondary | ICD-10-CM | POA: Diagnosis present

## 2024-02-28 DIAGNOSIS — M961 Postlaminectomy syndrome, not elsewhere classified: Secondary | ICD-10-CM | POA: Diagnosis present

## 2024-02-28 DIAGNOSIS — M5442 Lumbago with sciatica, left side: Secondary | ICD-10-CM | POA: Insufficient documentation

## 2024-02-28 NOTE — Progress Notes (Signed)
Staples removed, steri-strips applied

## 2024-02-28 NOTE — Progress Notes (Signed)
 Safety precautions to be maintained throughout the outpatient stay will include: orient to surroundings, keep bed in low position, maintain call bell within reach at all times, provide assistance with transfer out of bed and ambulation.

## 2024-04-02 ENCOUNTER — Other Ambulatory Visit: Payer: Self-pay

## 2024-04-02 MED ORDER — PAIN MANAGEMENT IT PUMP REFILL: 1.0000 | Freq: Once | INTRATHECAL | 0 refills | Status: AC

## 2024-04-24 ENCOUNTER — Telehealth: Payer: Self-pay

## 2024-04-24 NOTE — Progress Notes (Signed)
 PROVIDER NOTE: Interpretation of information contained herein should be left to medically-trained personnel. Specific patient instructions are provided elsewhere under Patient Instructions section of medical record. This document was created in part using STT-dictation technology, any transcriptional errors that may result from this process are unintentional.  Patient: Tiffany Hunter Type: Established DOB: 01-24-1946 MRN: [id number] PCP: Neita Dover, PA-C  Service: Procedure DOS: 04/25/2024 Setting: Ambulatory Location: Ambulatory outpatient facility Delivery: Face-to-face Provider: Eric DELENA Como, MD Specialty: Interventional Pain Management Specialty designation: 09 Location: Outpatient facility Ref. Prov.: Neita Dover, PA-C       Interventional Therapy   Primary Reason for Visit: Interventional Pain Management Treatment. CC: Back Pain (lower)  Procedure:          Type: Management of Intrathecal Drug Delivery System (IDDS) - Reservoir Refill (04009). No rate change.  Indications: 1. Chronic pain syndrome   2. 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 Chronic lower extremity pain (2ry area of Pain) (Left)   4. Chronic lower extremity radicular pain (S1) (Right)   5. Chronic lumbar radicular pain (L4 Dermatome) (Left)   6. DDD (degenerative disc disease), lumbosacral w/ LBP & LEP   7. 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 Lumbar facet joint pain   9. Lumbar facet joint syndrome (Left)   10. Lumbar facet arthropathy (Multilevel) (Bilateral)   11. Encounter for interrogation of infusion pump   12. Encounter for adjustment and management of infusion pump    Pain Assessment: Self-Reported Pain Score: 7 /10             Reported level is compatible with observation.         Intrathecal Drug Delivery System (IDDS)  Pump Device:  Manufacturer: Medtronic Model: Synchromed II Model No.: [id number] Serial No.:  [id number] H Delivery Route: Intrathecal Type: Programmable  Volume (mL): 40 mL reservoir Priming Volume: N/A  Calibration Constant: 114.0  MRI compatibility: Conditional   Implant Details:  Date: 07/07/2017 Implanter: Fairy Fleet, MD Contact Information: [facility] [phone] Last Revision/Replacement: 07/07/2017 Estimated Replacement Date: Nov/2025  Implant Site: Abdominal Laterality: Left  Catheter: Manufacturer: Medtronic Model:  Intrathecal catheter Model No.: 8731  Serial No.: n/a  Implanted Length (cm): 28.9  Catheter Volume (mL): 0.209  Tip Location (Level): T11 (Left) Canal Access Site: L2-3  Drug content:  Primary Medication Class: Opioid  Medication: PF-Hydromorphone (Dilaudid)  Concentration: 8 mg/mL   Secondary Medication Class: Opioid  Medication: PF-Sufentanil  Concentration: 70 mcg/mL   Tertiary Medication Class: none  Medication: none   PA parameters (PCA-mode):  Mode: Off (Inactive)  Programming:  Type: Simple continuous.  Medication, Concentration, Infusion Program, & Delivery Rate: For up-to-date details please see most recent scanned programming printout.   Intrathecal Pump Therapy Assessment  Manufacturer: Medtronic Synchromed Type: Programmable Volume: 40 mL reservoir MRI compatibility: Yes    Drug content:  Primary Medication Class: Opioid Primary Medication: PF-Hydromorphone (Dilaudid) (8 mg/mL)  Secondary Medication: PF-Sufentanil (70 g/mL)  Other Medication: No third    Programming:  Type: Simple continuous. See pump readout for details.     Changes:  Medication Change: None at this point Rate Change: No change in rate  Reported side-effects or adverse reactions: None reported  Effectiveness: Described as relatively effective, allowing for increase in activities of daily living (ADL) Clinically meaningful improvement in function (CMIF): Sustained CMIF goals met  Plan: Pump refill today    Pharmacotherapy Assessment  Opioid Analgesic: No other oral  opioid analgesics prescribed by our practice. Intrathecal PF-Hydromorphone 3.9295 mg/day + PF-Sufentanil 34.383 mcg/day. MME: Aprox. 58.7 mg/day.     Monitoring: [place] PMP: PDMP reviewed during this encounter.       Pharmacotherapy: No side-effects or adverse reactions reported. Compliance: No problems identified. Effectiveness: Clinically acceptable. Plan: Refer to POC.  UDS:  Summary  Date Value Ref Range Status  04/26/2023 FINAL  Final    Comment:    ==================================================================== ToxASSURE Select 13 (MW) ==================================================================== Specimen Alert ToxAssure, ToxAssure FLEX or MAT drug testing: -Technical component- Result certification performed at [facility], [address] [place], [place] [address]. [phone] Lab Director Arnulfo Finder, PhrmD. ==================================================================== Test                             Result       Flag       Units  Drug Present and Declared for Prescription Verification   Hydromorphone                  2187         EXPECTED   ng/mg creat    Hydromorphone may be administered as a scheduled prescription    medication; it is also an expected metabolite of hydrocodone.  Drug Absent but Declared for Prescription Verification   Sufentanil                     Not Detected UNEXPECTED ng/mg creat ==================================================================== Test                      Result    Flag   Units      Ref Range   Creatinine              83               mg/dL      >=79 ==================================================================== Declared Medications:  The flagging and interpretation on this report are based on the  following declared medications.  Unexpected results may arise from  inaccuracies in the declared medications.   **Note: The  testing scope of this panel includes these medications:   Hydromorphone  Sufentanil   **Note: The testing scope of this panel does not include the  following reported medications:   Albuterol (Ventolin HFA)  Atorvastatin (Lipitor)  Carvedilol (Coreg)  Celecoxib (Celebrex)  Diethylpropion  Losartan (Cozaar)  Meloxicam (Mobic)  Naloxegol  (Movantik )  Naloxone  (Narcan )  Omeprazole (Prilosec)  Oxybutynin (Ditropan)  Sulfadiazine (SSD) ==================================================================== For clinical consultation, please call [phone]. ====================================================================    No results found for: CBDTHCR, D8THCCBX, D9THCCBX  H&P (Pre-op Assessment):  Ms. Jamerson is a 78 y.o. (year old), female patient, seen today for interventional treatment. She  has a past surgical history that includes Carpal tunnel release (Bilateral); Back surgery; Breast surgery (Bilateral); Cholecystectomy; Colonoscopy w/ polypectomy; Cataract extraction w/ intraocular lens implant (Right); Abdominal hysterectomy; Fracture surgery (Right); Cardiac catheterization (2012); Pain pump revision (Left, 07/07/2017); Intrathecal pump implant; and Pain pump implantation (N/A, 02/13/2024). Ms. Fuqua has a current medication list which includes the following prescription(s): albuterol, ascorbic acid, atorvastatin, cephalexin, clarithromycin , diphenhydramine, lidocaine , losartan, naloxegol  oxalate, naloxone , NON FORMULARY, omeprazole, and tramadol . Her primarily concern today is the Back Pain (lower)  Initial Vital Signs:  Pulse/HCG Rate: 71  Temp: 98 F (36.7 C) Resp: 16 BP: (!) 152/74 SpO2: 98 %  BMI: Estimated body mass index is 29.7 kg/m as calculated  from the following:   Height as of this encounter: 5' 2.5 (1.588 m).   Weight as of this encounter: 165 lb (74.8 kg).  Risk Assessment: Allergies: Reviewed. She is allergic to penicillin g, doxycycline  hyclate, and sulfa antibiotics.  Allergy Precautions: None required Coagulopathies: Reviewed. None identified.  Blood-thinner therapy: None at this time Active Infection(s): Reviewed. None identified. Ms. Vierling is afebrile  Site Confirmation: Ms. Provencal was asked to confirm the procedure and laterality before marking the site Procedure checklist: Completed Consent: Before the procedure and under the influence of no sedative(s), amnesic(s), or anxiolytics, the patient was informed of the treatment options, risks and possible complications. To fulfill our ethical and legal obligations, as recommended by the American Medical Association's Code of Ethics, I have informed the patient of my clinical impression; the nature and purpose of the treatment or procedure; the risks, benefits, and possible complications of the intervention; the alternatives, including doing nothing; the risk(s) and benefit(s) of the alternative treatment(s) or procedure(s); and the risk(s) and benefit(s) of doing nothing.  Ms. Condie was provided with information about the general risks and possible complications associated with most interventional procedures. These include, but are not limited to: failure to achieve desired goals, infection, bleeding, organ or nerve damage, allergic reactions, paralysis, and/or death.  In addition, she was informed of those risks and possible complications associated to this particular procedure, which include, but are not limited to: damage to the implant; failure to decrease pain; local, systemic, or serious CNS infections, intraspinal abscess with possible cord compression and paralysis, or life-threatening such as meningitis; bleeding; organ damage; nerve injury or damage with subsequent sensory, motor, and/or autonomic system dysfunction, resulting in transient or permanent pain, numbness, and/or weakness of one or several areas of the body; allergic reactions, either minor or major  life-threatening, such as anaphylactic or anaphylactoid reactions.  Furthermore, Ms. Caponi was informed of those risks and complications associated with the medications. These include, but are not limited to: allergic reactions (i.e.: anaphylactic or anaphylactoid reactions); endorphine suppression; bradycardia and/or hypotension; water retention and/or peripheral vascular relaxation leading to lower extremity edema and possible stasis ulcers; respiratory depression and/or shortness of breath; decreased metabolic rate leading to weight gain; swelling or edema; medication-induced neural toxicity; particulate matter embolism and blood vessel occlusion with resultant organ, and/or nervous system infarction; and/or intrathecal granuloma formation with possible spinal cord compression and permanent paralysis.  Before refilling the pump Ms. Silberman was informed that some of the medications used in the devise may not be FDA approved for such use and therefore it constitutes an off-label use of the medications.  Finally, she was informed that Medicine is not an exact science; therefore, there is also the possibility of unforeseen or unpredictable risks and/or possible complications that may result in a catastrophic outcome. The patient indicated having understood very clearly. We have given the patient no guarantees and we have made no promises. Enough time was given to the patient to ask questions, all of which were answered to the patient's satisfaction. Ms. Capano has indicated that she wanted to continue with the procedure. Attestation: I, the ordering provider, attest that I have discussed with the patient the benefits, risks, side-effects, alternatives, likelihood of achieving goals, and potential problems during recovery for the procedure that I have provided informed consent. Date  Time: 04/25/2024  2:04 PM  Pre-Procedure Preparation:  Monitoring: As per clinic protocol. Respiration, ETCO2, SpO2,  BP, heart rate and rhythm monitor placed and checked for adequate  function Safety Precautions: Patient was assessed for positional comfort and pressure points before starting the procedure. Time-out: I initiated and conducted the Time-out before starting the procedure, as per protocol. The patient was asked to participate by confirming the accuracy of the Time Out information. Verification of the correct person, site, and procedure were performed and confirmed by me, the nursing staff, and the patient. Time-out conducted as per Joint Commission's Universal Protocol (UP.01.01.01). Time: 1423 Start Time: 1425 hrs.  Description of Procedure:          Position: Supine Target Area: Central-port of intrathecal pump. Approach: Anterior, 90 degree angle approach. Area Prepped: Entire Area around the pump implant. ChloraPrep (2% chlorhexidine  gluconate and 70% isopropyl alcohol) Safety Precautions: Aspiration looking for blood return was conducted prior to all injections. At no point did we inject any substances, as a needle was being advanced. No attempts were made at seeking any paresthesias. Safe injection practices and needle disposal techniques used. Medications properly checked for expiration dates. SDV (single dose vial) medications used. Description of the Procedure: Protocol guidelines were followed. Two nurses trained to do implant refills were present during the entire procedure. The refill medication was checked by both healthcare providers as well as the patient. The patient was included in the Time-out to verify the medication. The patient was placed in position. The pump was identified. The area was prepped in the usual manner. The sterile template was positioned over the pump, making sure the side-port location matched that of the pump. Both, the pump and the template were held for stability. The needle provided in the Medtronic Kit was then introduced thru the center of the template and  into the central port. The pump content was aspirated and discarded volume documented. The new medication was slowly infused into the pump, thru the filter, making sure to avoid overpressure of the device. The needle was then removed and the area cleansed, making sure to leave some of the prepping solution back to take advantage of its long term bactericidal properties. The pump was interrogated and programmed to reflect the correct medication, volume, and dosage. The program was printed and taken to the physician for approval. Once checked and signed by the physician, a copy was provided to the patient and another scanned into the EMR.  Vitals:   04/25/24 1402  BP: (!) 152/74  Pulse: 71  Resp: 16  Temp: 98 F (36.7 C)  SpO2: 98%  Weight: 165 lb (74.8 kg)  Height: 5' 2.5 (1.588 m)    Start Time: 1425 hrs. End Time: 1438 hrs. Materials & Medications: Medtronic Refill Kit Medication(s): Please see chart orders for details.  Type of Imaging Technique: None used Indication(s): N/A Exposure Time: No patient exposure Contrast: None used. Fluoroscopic Guidance: N/A Ultrasound Guidance: N/A Interpretation: N/A  Antibiotic Prophylaxis:   Anti-infectives (From admission, onward)    None      Indication(s): None identified  Post-operative Assessment:  Post-procedure Vital Signs:  Pulse/HCG Rate: 71  Temp: 98 F (36.7 C) Resp: 16 BP: (!) 152/74 SpO2: 98 %  EBL: None  Complications: No immediate post-treatment complications observed by team, or reported by patient.  Note: The patient tolerated the entire procedure well. A repeat set of vitals were taken after the procedure and the patient was kept under observation following institutional policy, for this type of procedure. Post-procedural neurological assessment was performed, showing return to baseline, prior to discharge. The patient was provided with post-procedure discharge instructions, including a section on  how to  identify potential problems. Should any problems arise concerning this procedure, the patient was given instructions to immediately contact us , at any time, without hesitation. In any case, we plan to contact the patient by telephone for a follow-up status report regarding this interventional procedure.  Comments:  No additional relevant information.  Plan of Care (POC)  Orders:  Orders Placed This Encounter  Procedures   PUMP REFILL    Maintain Protocol by having two(2) healthcare providers during procedure and programming.    Scheduling Instructions:     Please refill intrathecal pump today. (04/25/2024)    Where will this procedure be performed?:   [facility]   PUMP REFILL    Whenever possible schedule on a procedure today.    Standing Status:   Future    Expiration Date:   08/23/2024    Scheduling Instructions:     Please schedule intrathecal pump refill based on pump programming. Avoid schedule intervals of more than 120 days (4 months).    Where will this procedure be performed?:   [facility]   Informed Consent Details: Physician/Practitioner Attestation; Transcribe to consent form and obtain patient signature    Transcribe to consent form and obtain patient signature.    Physician/Practitioner attestation of informed consent for procedure/surgical case:   I, the physician/practitioner, attest that I have discussed with the patient the benefits, risks, side effects, alternatives, likelihood of achieving goals and potential problems during recovery for the procedure that I have provided informed consent.    Procedure:   Intrathecal pump refill    Physician/Practitioner performing the procedure:   Attending Physician: Marzell Allemand A. Tanya, MD & designated trained staff    Indication/Reason:   Chronic Pain Syndrome (G89.4), presence of an intrathecal pump (Z97.8)     Opioid Analgesic: No other oral opioid analgesics prescribed by our practice. Intrathecal  PF-Hydromorphone 3.9295 mg/day + PF-Sufentanil 34.383 mcg/day. MME: Aprox. 58.7 mg/day.      Medications ordered for procedure: No orders of the defined types were placed in this encounter.  Medications administered: Sherrilyn Rodriguez had no medications administered during this visit.  See the medical record for exact dosing, route, and time of administration.    Interventional Therapies  Risk  Complexity Considerations:   Presence of intrathecal pump  DM  CAD  Pulmonary hypertension     Planned  Pending:   Revision and replacement of Medtronic intrathecal pump.    Under consideration:   Diagnostic/therapeutic bilateral lumbar (L4-5, L5-S1) facet MBB L3R1  Diagnostic left lumbar facet MBB #3  Possible left lumbar facet RFA #1   Completed:   Diagnostic/therapeutic right caudal ESI x1 (01/04/2022) (100/100/100/100) (of right leg pain)  Diagnostic left lumbar facet MBB x2 (12/22/2016) (07/14/2020) (100/100/100/100) (of left low back pain)    Therapeutic  Palliative (PRN) options:   Palliative intrathecal pump refills  Therapeutic/palliative left sided lumbar facet MBB #3        Follow-up plan:   Return for Pump Refill (Max:31mo).     Recent Visits Date Type Provider Dept  02/28/24 Office Visit Tanya Glisson, MD [facility]  01/29/24 Office Visit Tanya Glisson, MD [facility]  Showing recent visits within past 90 days and meeting all other requirements Today's Visits Date Type Provider Dept  04/25/24 Procedure visit Tanya Glisson, MD [facility]  Showing today's visits and meeting all other requirements Future Appointments Date Type Provider Dept  07/02/24 Appointment Tanya Glisson, MD [facility]  Showing  future appointments within next 90 days and meeting all other requirements   Disposition: Discharge home  Discharge (Date  Time): 04/25/2024; 1441 hrs.   Primary Care Physician: Neita Dover,  PA-C Location[facility] Note by: Eric DELENA Como, MD (TTS technology used. I apologize for any typographical errors that were not detected and corrected.) Date: 04/25/2024; Time: 3:07 PM  Disclaimer:  Medicine is not an visual merchandiser. The only guarantee in medicine is that nothing is guaranteed. It is important to note that the decision to proceed with this intervention was based on the information collected from the patient. The Data and conclusions were drawn from the patient's questionnaire, the interview, and the physical examination. Because the information was provided in large part by the patient, it cannot be guaranteed that it has not been purposely or unconsciously manipulated. Every effort has been made to obtain as much relevant data as possible for this evaluation. It is important to note that the conclusions that lead to this procedure are derived in large part from the available data. Always take into account that the treatment will also be dependent on availability of resources and existing treatment guidelines, considered by other Pain Management Practitioners as being common knowledge and practice, at the time of the intervention. For Medico-Legal purposes, it is also important to point out that variation in procedural techniques and pharmacological choices are the acceptable norm. The indications, contraindications, technique, and results of the above procedure should only be interpreted and judged by a Board-Certified Interventional Pain Specialist with extensive familiarity and expertise in the same exact procedure and technique.

## 2024-04-24 NOTE — Telephone Encounter (Signed)
 Called to confirm appt for 04/25/24, spoke with son and he states that she will be here.

## 2024-04-25 ENCOUNTER — Ambulatory Visit: Attending: Pain Medicine | Admitting: Pain Medicine

## 2024-04-25 ENCOUNTER — Encounter: Payer: Self-pay | Admitting: Pain Medicine

## 2024-04-25 VITALS — BP 152/74 | HR 71 | Temp 98.0°F | Resp 16 | Ht 62.5 in | Wt 165.0 lb

## 2024-04-25 DIAGNOSIS — M961 Postlaminectomy syndrome, not elsewhere classified: Secondary | ICD-10-CM | POA: Insufficient documentation

## 2024-04-25 DIAGNOSIS — M47816 Spondylosis without myelopathy or radiculopathy, lumbar region: Secondary | ICD-10-CM | POA: Insufficient documentation

## 2024-04-25 DIAGNOSIS — M5410 Radiculopathy, site unspecified: Secondary | ICD-10-CM | POA: Diagnosis present

## 2024-04-25 DIAGNOSIS — M5459 Other low back pain: Secondary | ICD-10-CM | POA: Diagnosis present

## 2024-04-25 DIAGNOSIS — M5416 Radiculopathy, lumbar region: Secondary | ICD-10-CM | POA: Insufficient documentation

## 2024-04-25 DIAGNOSIS — G8929 Other chronic pain: Secondary | ICD-10-CM | POA: Insufficient documentation

## 2024-04-25 DIAGNOSIS — M79605 Pain in left leg: Secondary | ICD-10-CM | POA: Diagnosis present

## 2024-04-25 DIAGNOSIS — G894 Chronic pain syndrome: Secondary | ICD-10-CM | POA: Insufficient documentation

## 2024-04-25 DIAGNOSIS — M5442 Lumbago with sciatica, left side: Secondary | ICD-10-CM | POA: Insufficient documentation

## 2024-04-25 DIAGNOSIS — Z451 Encounter for adjustment and management of infusion pump: Secondary | ICD-10-CM | POA: Diagnosis present

## 2024-04-25 DIAGNOSIS — M51372 Other intervertebral disc degeneration, lumbosacral region with discogenic back pain and lower extremity pain: Secondary | ICD-10-CM | POA: Insufficient documentation

## 2024-04-25 NOTE — Patient Instructions (Signed)

## 2024-04-25 NOTE — Progress Notes (Signed)
 Safety precautions to be maintained throughout the outpatient stay will include: orient to surroundings, keep bed in low position, maintain call bell within reach at all times, provide assistance with transfer out of bed and ambulation.

## 2024-04-26 ENCOUNTER — Telehealth: Payer: Self-pay

## 2024-04-26 NOTE — Telephone Encounter (Signed)
 Post procedure call, spoke with patients son. Reports that patient is doing well. Encouraged to call with questions or concerns.

## 2024-05-07 MED FILL — Medication: INTRATHECAL | Qty: 1 | Status: AC

## 2024-06-05 ENCOUNTER — Other Ambulatory Visit: Payer: Self-pay

## 2024-06-05 MED ORDER — PAIN MANAGEMENT IT PUMP REFILL: 1.0000 | Freq: Once | INTRATHECAL | 0 refills | Status: AC

## 2024-07-02 ENCOUNTER — Encounter: Admitting: Pain Medicine
